# Patient Record
Sex: Female | Born: 1950 | Race: White | Hispanic: No | Marital: Married | Smoking: Never smoker
Health system: Southern US, Community
[De-identification: ages and names within clinical notes are randomized; demographics above are authoritative.]

## PROBLEM LIST (undated history)

## (undated) DIAGNOSIS — M1990 Unspecified osteoarthritis, unspecified site: Secondary | ICD-10-CM

## (undated) DIAGNOSIS — I10 Essential (primary) hypertension: Secondary | ICD-10-CM

## (undated) DIAGNOSIS — K219 Gastro-esophageal reflux disease without esophagitis: Secondary | ICD-10-CM

## (undated) DIAGNOSIS — S42009A Fracture of unspecified part of unspecified clavicle, initial encounter for closed fracture: Secondary | ICD-10-CM

## (undated) DIAGNOSIS — K760 Fatty (change of) liver, not elsewhere classified: Secondary | ICD-10-CM

## (undated) DIAGNOSIS — C649 Malignant neoplasm of unspecified kidney, except renal pelvis: Secondary | ICD-10-CM

## (undated) DIAGNOSIS — Z9289 Personal history of other medical treatment: Secondary | ICD-10-CM

## (undated) DIAGNOSIS — G4730 Sleep apnea, unspecified: Secondary | ICD-10-CM

## (undated) DIAGNOSIS — E7800 Pure hypercholesterolemia, unspecified: Secondary | ICD-10-CM

## (undated) DIAGNOSIS — F419 Anxiety disorder, unspecified: Secondary | ICD-10-CM

## (undated) DIAGNOSIS — F329 Major depressive disorder, single episode, unspecified: Secondary | ICD-10-CM

## (undated) DIAGNOSIS — K759 Inflammatory liver disease, unspecified: Secondary | ICD-10-CM

## (undated) DIAGNOSIS — S8291XA Unspecified fracture of right lower leg, initial encounter for closed fracture: Secondary | ICD-10-CM

## (undated) DIAGNOSIS — F32A Depression, unspecified: Secondary | ICD-10-CM

## (undated) DIAGNOSIS — C801 Malignant (primary) neoplasm, unspecified: Secondary | ICD-10-CM

## (undated) HISTORY — DX: Depression, unspecified: F32.A

## (undated) HISTORY — DX: Major depressive disorder, single episode, unspecified: F32.9

## (undated) HISTORY — PX: HEEL SPUR SURGERY: SHX665

## (undated) HISTORY — PX: DILATION AND CURETTAGE OF UTERUS: SHX78

## (undated) HISTORY — PX: CHOLECYSTECTOMY: SHX55

## (undated) HISTORY — DX: Fatty (change of) liver, not elsewhere classified: K76.0

## (undated) HISTORY — DX: Malignant neoplasm of unspecified kidney, except renal pelvis: C64.9

## (undated) HISTORY — DX: Essential (primary) hypertension: I10

## (undated) HISTORY — PX: ABDOMINAL HYSTERECTOMY: SHX81

## (undated) HISTORY — DX: Unspecified fracture of right lower leg, initial encounter for closed fracture: S82.91XA

## (undated) HISTORY — PX: TEMPOROMANDIBULAR JOINT SURGERY: SHX35

## (undated) HISTORY — PX: APPENDECTOMY: SHX54

---

## 1970-01-12 DIAGNOSIS — K759 Inflammatory liver disease, unspecified: Secondary | ICD-10-CM

## 1970-01-12 HISTORY — DX: Inflammatory liver disease, unspecified: K75.9

## 2002-11-13 ENCOUNTER — Encounter (HOSPITAL_COMMUNITY): Admission: RE | Admit: 2002-11-13 | Discharge: 2002-12-13 | Payer: Self-pay | Admitting: Rheumatology

## 2002-12-13 ENCOUNTER — Encounter (HOSPITAL_COMMUNITY): Admission: RE | Admit: 2002-12-13 | Discharge: 2003-01-12 | Payer: Self-pay | Admitting: Interventional Radiology

## 2002-12-19 ENCOUNTER — Ambulatory Visit (HOSPITAL_COMMUNITY): Admission: RE | Admit: 2002-12-19 | Discharge: 2002-12-19 | Payer: Self-pay | Admitting: Otolaryngology

## 2003-03-29 ENCOUNTER — Encounter: Admission: RE | Admit: 2003-03-29 | Discharge: 2003-03-29 | Payer: Self-pay | Admitting: Orthopedic Surgery

## 2005-03-30 ENCOUNTER — Encounter (INDEPENDENT_AMBULATORY_CARE_PROVIDER_SITE_OTHER): Payer: Self-pay | Admitting: Internal Medicine

## 2005-04-01 ENCOUNTER — Ambulatory Visit (HOSPITAL_COMMUNITY): Admission: RE | Admit: 2005-04-01 | Discharge: 2005-04-01 | Payer: Self-pay | Admitting: Internal Medicine

## 2005-05-14 ENCOUNTER — Ambulatory Visit (HOSPITAL_COMMUNITY): Admission: RE | Admit: 2005-05-14 | Discharge: 2005-05-14 | Payer: Self-pay | Admitting: Internal Medicine

## 2005-05-14 ENCOUNTER — Ambulatory Visit: Payer: Self-pay | Admitting: Internal Medicine

## 2005-06-03 ENCOUNTER — Ambulatory Visit (HOSPITAL_COMMUNITY): Admission: RE | Admit: 2005-06-03 | Discharge: 2005-06-03 | Payer: Self-pay | Admitting: Internal Medicine

## 2005-06-03 ENCOUNTER — Ambulatory Visit: Payer: Self-pay | Admitting: Internal Medicine

## 2005-07-29 ENCOUNTER — Ambulatory Visit: Payer: Self-pay | Admitting: Internal Medicine

## 2006-01-18 ENCOUNTER — Encounter (INDEPENDENT_AMBULATORY_CARE_PROVIDER_SITE_OTHER): Payer: Self-pay | Admitting: *Deleted

## 2006-01-18 ENCOUNTER — Ambulatory Visit: Payer: Self-pay | Admitting: Internal Medicine

## 2006-01-18 ENCOUNTER — Ambulatory Visit (HOSPITAL_COMMUNITY): Admission: RE | Admit: 2006-01-18 | Discharge: 2006-01-18 | Payer: Self-pay | Admitting: Gastroenterology

## 2006-09-03 ENCOUNTER — Ambulatory Visit: Payer: Self-pay | Admitting: Internal Medicine

## 2006-09-03 DIAGNOSIS — R5381 Other malaise: Secondary | ICD-10-CM | POA: Insufficient documentation

## 2006-09-03 DIAGNOSIS — IMO0001 Reserved for inherently not codable concepts without codable children: Secondary | ICD-10-CM

## 2006-09-03 DIAGNOSIS — K5900 Constipation, unspecified: Secondary | ICD-10-CM | POA: Insufficient documentation

## 2006-09-03 DIAGNOSIS — R32 Unspecified urinary incontinence: Secondary | ICD-10-CM

## 2006-09-03 DIAGNOSIS — R5383 Other fatigue: Secondary | ICD-10-CM

## 2006-09-03 DIAGNOSIS — M76899 Other specified enthesopathies of unspecified lower limb, excluding foot: Secondary | ICD-10-CM

## 2006-09-03 DIAGNOSIS — I10 Essential (primary) hypertension: Secondary | ICD-10-CM

## 2006-09-03 DIAGNOSIS — F329 Major depressive disorder, single episode, unspecified: Secondary | ICD-10-CM

## 2006-09-03 DIAGNOSIS — M545 Low back pain: Secondary | ICD-10-CM

## 2006-09-03 DIAGNOSIS — M1990 Unspecified osteoarthritis, unspecified site: Secondary | ICD-10-CM | POA: Insufficient documentation

## 2006-09-03 DIAGNOSIS — E785 Hyperlipidemia, unspecified: Secondary | ICD-10-CM

## 2006-09-03 DIAGNOSIS — J4489 Other specified chronic obstructive pulmonary disease: Secondary | ICD-10-CM | POA: Insufficient documentation

## 2006-09-03 DIAGNOSIS — J449 Chronic obstructive pulmonary disease, unspecified: Secondary | ICD-10-CM

## 2006-09-03 LAB — CONVERTED CEMR LAB
Bilirubin Urine: NEGATIVE
Glucose, Urine, Semiquant: NEGATIVE
Ketones, urine, test strip: NEGATIVE
Nitrite: POSITIVE
Protein, U semiquant: 30
Specific Gravity, Urine: 1.02
Urobilinogen, UA: 0.2
pH: 6.5

## 2006-09-04 ENCOUNTER — Encounter (INDEPENDENT_AMBULATORY_CARE_PROVIDER_SITE_OTHER): Payer: Self-pay | Admitting: Internal Medicine

## 2006-09-06 ENCOUNTER — Telehealth (INDEPENDENT_AMBULATORY_CARE_PROVIDER_SITE_OTHER): Payer: Self-pay | Admitting: *Deleted

## 2006-09-09 ENCOUNTER — Ambulatory Visit (HOSPITAL_COMMUNITY): Admission: RE | Admit: 2006-09-09 | Discharge: 2006-09-09 | Payer: Self-pay | Admitting: Internal Medicine

## 2006-09-09 ENCOUNTER — Encounter (INDEPENDENT_AMBULATORY_CARE_PROVIDER_SITE_OTHER): Payer: Self-pay | Admitting: Internal Medicine

## 2006-09-09 ENCOUNTER — Telehealth (INDEPENDENT_AMBULATORY_CARE_PROVIDER_SITE_OTHER): Payer: Self-pay | Admitting: *Deleted

## 2006-09-14 ENCOUNTER — Telehealth (INDEPENDENT_AMBULATORY_CARE_PROVIDER_SITE_OTHER): Payer: Self-pay | Admitting: *Deleted

## 2006-09-20 ENCOUNTER — Encounter (INDEPENDENT_AMBULATORY_CARE_PROVIDER_SITE_OTHER): Payer: Self-pay | Admitting: Internal Medicine

## 2006-09-21 ENCOUNTER — Encounter (INDEPENDENT_AMBULATORY_CARE_PROVIDER_SITE_OTHER): Payer: Self-pay | Admitting: Internal Medicine

## 2006-10-01 ENCOUNTER — Encounter (INDEPENDENT_AMBULATORY_CARE_PROVIDER_SITE_OTHER): Payer: Self-pay | Admitting: Internal Medicine

## 2007-01-03 ENCOUNTER — Ambulatory Visit: Payer: Self-pay | Admitting: Internal Medicine

## 2007-01-03 LAB — CONVERTED CEMR LAB
Bilirubin Urine: NEGATIVE
Blood in Urine, dipstick: NEGATIVE
Glucose, Urine, Semiquant: NEGATIVE
Nitrite: POSITIVE
Protein, U semiquant: 30
Specific Gravity, Urine: 1.025
Urobilinogen, UA: 0.2
pH: 5.5

## 2007-01-27 ENCOUNTER — Ambulatory Visit: Payer: Self-pay | Admitting: Internal Medicine

## 2007-01-27 LAB — CONVERTED CEMR LAB
Bilirubin Urine: NEGATIVE
Blood in Urine, dipstick: NEGATIVE
Glucose, Urine, Semiquant: NEGATIVE
Nitrite: NEGATIVE
Protein, U semiquant: NEGATIVE
Specific Gravity, Urine: 1.02
Urobilinogen, UA: 1
pH: 6.5

## 2007-01-28 ENCOUNTER — Encounter (INDEPENDENT_AMBULATORY_CARE_PROVIDER_SITE_OTHER): Payer: Self-pay | Admitting: Internal Medicine

## 2007-04-13 ENCOUNTER — Ambulatory Visit: Payer: Self-pay | Admitting: Internal Medicine

## 2007-04-13 DIAGNOSIS — R9431 Abnormal electrocardiogram [ECG] [EKG]: Secondary | ICD-10-CM

## 2007-04-13 DIAGNOSIS — R519 Headache, unspecified: Secondary | ICD-10-CM | POA: Insufficient documentation

## 2007-04-13 DIAGNOSIS — R51 Headache: Secondary | ICD-10-CM | POA: Insufficient documentation

## 2007-04-14 ENCOUNTER — Telehealth (INDEPENDENT_AMBULATORY_CARE_PROVIDER_SITE_OTHER): Payer: Self-pay | Admitting: *Deleted

## 2007-04-14 ENCOUNTER — Encounter (INDEPENDENT_AMBULATORY_CARE_PROVIDER_SITE_OTHER): Payer: Self-pay | Admitting: Internal Medicine

## 2007-04-14 LAB — CONVERTED CEMR LAB
HCV Ab: NEGATIVE
Hepatitis B Surface Ag: NEGATIVE

## 2007-04-15 ENCOUNTER — Telehealth (INDEPENDENT_AMBULATORY_CARE_PROVIDER_SITE_OTHER): Payer: Self-pay | Admitting: *Deleted

## 2007-05-13 ENCOUNTER — Telehealth (INDEPENDENT_AMBULATORY_CARE_PROVIDER_SITE_OTHER): Payer: Self-pay | Admitting: *Deleted

## 2007-06-07 ENCOUNTER — Ambulatory Visit (HOSPITAL_COMMUNITY): Admission: RE | Admit: 2007-06-07 | Discharge: 2007-06-07 | Payer: Self-pay | Admitting: Internal Medicine

## 2007-06-16 ENCOUNTER — Encounter: Admission: RE | Admit: 2007-06-16 | Discharge: 2007-06-16 | Payer: Self-pay | Admitting: Internal Medicine

## 2007-06-16 ENCOUNTER — Encounter (INDEPENDENT_AMBULATORY_CARE_PROVIDER_SITE_OTHER): Payer: Self-pay | Admitting: Internal Medicine

## 2007-06-29 ENCOUNTER — Telehealth (INDEPENDENT_AMBULATORY_CARE_PROVIDER_SITE_OTHER): Payer: Self-pay | Admitting: *Deleted

## 2007-07-13 ENCOUNTER — Ambulatory Visit: Payer: Self-pay | Admitting: Internal Medicine

## 2007-07-27 ENCOUNTER — Encounter (INDEPENDENT_AMBULATORY_CARE_PROVIDER_SITE_OTHER): Payer: Self-pay | Admitting: Internal Medicine

## 2007-09-05 ENCOUNTER — Telehealth (INDEPENDENT_AMBULATORY_CARE_PROVIDER_SITE_OTHER): Payer: Self-pay | Admitting: *Deleted

## 2007-09-06 ENCOUNTER — Ambulatory Visit: Payer: Self-pay | Admitting: Internal Medicine

## 2007-09-06 LAB — CONVERTED CEMR LAB
Bilirubin Urine: NEGATIVE
Glucose, Urine, Semiquant: 100
Ketones, urine, test strip: NEGATIVE
Nitrite: POSITIVE
Protein, U semiquant: 30
Specific Gravity, Urine: 1.015
Urobilinogen, UA: 1
pH: 7.5

## 2007-10-28 ENCOUNTER — Ambulatory Visit: Payer: Self-pay | Admitting: Internal Medicine

## 2007-11-01 ENCOUNTER — Encounter (INDEPENDENT_AMBULATORY_CARE_PROVIDER_SITE_OTHER): Payer: Self-pay | Admitting: Internal Medicine

## 2007-11-03 ENCOUNTER — Encounter (INDEPENDENT_AMBULATORY_CARE_PROVIDER_SITE_OTHER): Payer: Self-pay | Admitting: Internal Medicine

## 2007-11-03 LAB — CONVERTED CEMR LAB
ALT: 24 units/L (ref 0–35)
AST: 24 units/L (ref 0–37)
Albumin: 4.6 g/dL (ref 3.5–5.2)
Alkaline Phosphatase: 70 units/L (ref 39–117)
BUN: 26 mg/dL — ABNORMAL HIGH (ref 6–23)
CO2: 24 meq/L (ref 19–32)
Calcium: 9.6 mg/dL (ref 8.4–10.5)
Chloride: 103 meq/L (ref 96–112)
Cholesterol: 216 mg/dL — ABNORMAL HIGH (ref 0–200)
Creatinine, Ser: 0.9 mg/dL (ref 0.40–1.20)
Glucose, Bld: 110 mg/dL — ABNORMAL HIGH (ref 70–99)
HDL: 47 mg/dL (ref >39)
LDL Cholesterol: 146 mg/dL — ABNORMAL HIGH (ref 0–99)
Potassium: 3.6 meq/L (ref 3.5–5.3)
Sodium: 141 meq/L (ref 135–145)
Total Bilirubin: 0.5 mg/dL (ref 0.3–1.2)
Total CHOL/HDL Ratio: 4.6
Total Protein: 7.9 g/dL (ref 6.0–8.3)
Triglycerides: 116 mg/dL (ref <150)
VLDL: 23 mg/dL (ref 0–40)

## 2007-11-17 ENCOUNTER — Telehealth (INDEPENDENT_AMBULATORY_CARE_PROVIDER_SITE_OTHER): Payer: Self-pay | Admitting: *Deleted

## 2007-12-28 ENCOUNTER — Encounter: Admission: RE | Admit: 2007-12-28 | Discharge: 2007-12-28 | Payer: Self-pay | Admitting: Internal Medicine

## 2008-03-08 ENCOUNTER — Ambulatory Visit: Payer: Self-pay | Admitting: Internal Medicine

## 2008-03-14 ENCOUNTER — Encounter (INDEPENDENT_AMBULATORY_CARE_PROVIDER_SITE_OTHER): Payer: Self-pay | Admitting: Internal Medicine

## 2008-04-09 ENCOUNTER — Encounter (INDEPENDENT_AMBULATORY_CARE_PROVIDER_SITE_OTHER): Payer: Self-pay | Admitting: Internal Medicine

## 2008-04-10 ENCOUNTER — Ambulatory Visit: Payer: Self-pay | Admitting: Internal Medicine

## 2008-04-10 DIAGNOSIS — B353 Tinea pedis: Secondary | ICD-10-CM

## 2008-04-10 DIAGNOSIS — E119 Type 2 diabetes mellitus without complications: Secondary | ICD-10-CM

## 2008-04-10 LAB — CONVERTED CEMR LAB
ALT: 19 units/L (ref 0–35)
AST: 18 units/L (ref 0–37)
Albumin: 4.3 g/dL (ref 3.5–5.2)
Alkaline Phosphatase: 63 units/L (ref 39–117)
BUN: 24 mg/dL — ABNORMAL HIGH (ref 6–23)
CO2: 23 meq/L (ref 19–32)
Calcium: 9 mg/dL (ref 8.4–10.5)
Chloride: 105 meq/L (ref 96–112)
Cholesterol: 220 mg/dL — ABNORMAL HIGH (ref 0–200)
Creatinine, Ser: 0.64 mg/dL (ref 0.40–1.20)
Glucose, Bld: 134 mg/dL — ABNORMAL HIGH (ref 70–99)
Glucose, Bld: 120 mg/dL
HDL: 41 mg/dL (ref >39)
LDL Cholesterol: 141 mg/dL — ABNORMAL HIGH (ref 0–99)
Potassium: 3.6 meq/L (ref 3.5–5.3)
Sodium: 141 meq/L (ref 135–145)
Total Bilirubin: 0.4 mg/dL (ref 0.3–1.2)
Total CHOL/HDL Ratio: 5.4
Total Protein: 6.9 g/dL (ref 6.0–8.3)
Triglycerides: 190 mg/dL — ABNORMAL HIGH (ref <150)
VLDL: 38 mg/dL (ref 0–40)

## 2008-04-18 ENCOUNTER — Telehealth (INDEPENDENT_AMBULATORY_CARE_PROVIDER_SITE_OTHER): Payer: Self-pay | Admitting: *Deleted

## 2008-10-09 ENCOUNTER — Encounter (INDEPENDENT_AMBULATORY_CARE_PROVIDER_SITE_OTHER): Payer: Self-pay | Admitting: Internal Medicine

## 2010-02-09 LAB — CONVERTED CEMR LAB
ALT: 57 units/L — ABNORMAL HIGH (ref 0–35)
AST: 92 units/L — ABNORMAL HIGH (ref 0–37)
Albumin: 4.4 g/dL (ref 3.5–5.2)
Alkaline Phosphatase: 99 units/L (ref 39–117)
BUN: 26 mg/dL — ABNORMAL HIGH (ref 6–23)
Basophils Absolute: 0 10*3/uL (ref 0.0–0.1)
Basophils Relative: 0 % (ref 0–1)
CO2: 22 meq/L (ref 19–32)
Calcium: 10 mg/dL (ref 8.4–10.5)
Chloride: 101 meq/L (ref 96–112)
Cholesterol: 225 mg/dL — ABNORMAL HIGH (ref 0–200)
Creatinine, Ser: 0.77 mg/dL (ref 0.40–1.20)
Eosinophils Absolute: 0.6 10*3/uL (ref 0.0–0.7)
Eosinophils Relative: 4 % (ref 0–5)
Glucose, Bld: 127 mg/dL — ABNORMAL HIGH (ref 70–99)
HCT: 42.5 % (ref 36.0–46.0)
HDL: 38 mg/dL — ABNORMAL LOW (ref >39)
Hemoglobin: 14.3 g/dL (ref 12.0–15.0)
LDL Cholesterol: 127 mg/dL — ABNORMAL HIGH (ref 0–99)
Lymphocytes Relative: 28 % (ref 12–46)
Lymphs Abs: 3.8 10*3/uL (ref 0.7–4.0)
MCHC: 33.6 g/dL (ref 30.0–36.0)
MCV: 91.2 fL (ref 78.0–100.0)
Monocytes Absolute: 0.5 10*3/uL (ref 0.1–1.0)
Monocytes Relative: 4 % (ref 3–12)
Neutro Abs: 8.4 10*3/uL — ABNORMAL HIGH (ref 1.7–7.7)
Neutrophils Relative %: 63 % (ref 43–77)
Platelets: 326 10*3/uL (ref 150–400)
Potassium: 3.9 meq/L (ref 3.5–5.3)
RBC: 4.66 M/uL (ref 3.87–5.11)
RDW: 14.4 % (ref 11.5–15.5)
Sodium: 138 meq/L (ref 135–145)
Total Bilirubin: 0.5 mg/dL (ref 0.3–1.2)
Total CHOL/HDL Ratio: 5.9
Total Protein: 7.9 g/dL (ref 6.0–8.3)
Triglycerides: 299 mg/dL — ABNORMAL HIGH (ref <150)
VLDL: 60 mg/dL — ABNORMAL HIGH (ref 0–40)
WBC: 13.3 10*3/uL — ABNORMAL HIGH (ref 4.0–10.5)

## 2010-05-30 NOTE — Op Note (Signed)
[name], [name]                 ACCOUNT NO.:  [id number]   MEDICAL RECORD NO.:  [id number]          PATIENT TYPE:  AMB   LOCATION:  DAY                           FACILITY:  [facility]   PHYSICIAN:  Lionel December, M.D.    DATE OF BIRTH:  1950/06/15   DATE OF PROCEDURE:  01/18/2006  DATE OF DISCHARGE:                               OPERATIVE REPORT   PROCEDURE:  Colonoscopy.   INDICATIONS FOR PROCEDURE:  [name] [name] is a 60 year old Caucasian female with  history of colonic polyps.  She had two adenomas removed in December of  2002 in [place], [place].  She wanted to have this study done locally.  She remains free of GI symptoms.  Family history is negative for  colorectal carcinoma.  The procedure risks were reviewed with the  patient, and informed consent was obtained.   MEDICATIONS FOR CONSCIOUS SEDATION:  Demerol 50 mg IV, Versed 8 mg IV.   FINDINGS:  The procedure was performed in the endoscopy suite.  The  patient's vital signs and O2 saturations were monitored during the  procedure and remained stable.  The patient was placed in the left  lateral recumbent position and rectal examination performed.  No  abnormality noted on external or digital exam.  The Pentax videoscope  was placed into the rectum and advanced under vision into the sigmoid  colon and beyond.  The preparation was satisfactory.  The scope was  passed into the cecum which was identified by the appendiceal orifice  and ileocecal valve.  Pictures were taken for the record.  As the scope  was withdrawn, the colonic mucosa was carefully examined.  There was a 3  to 4-mm polyp at the splenic flexure which was ablated by cold biopsy.  Another 4-mm polyp was noted at the proximal sigmoid colon which was  ablated in a similar fashion.  The mucosa of the rest of the colon was  normal.  The rectal mucosa similarly was normal.  The scope was  retroflexed to examine the anorectal junction, and small hemorrhoids  were noted below  the dentate line.  The endoscope was straightened and  withdrawn.  The patient tolerated the procedure well.   FINAL DIAGNOSES:  1. Examination performed to the cecum.  2. Two small polyps ablated by cold biopsy, one from splenic flexure      and the second one from the sigmoid colon.  3. Small external hemorrhoids.   RECOMMENDATIONS:  Standard instructions given.  I will be contacting the  patient with the results of the biopsy and further recommendations.      Lionel December, M.D.  Electronically Signed     NR/MEDQ  D:  01/18/2006  T:  01/18/2006  Job:  [id number]   cc:   [name] [name]. Sherwood Gambler, MD  Fax: [phone]

## 2010-05-30 NOTE — Op Note (Signed)
[name] [name], [name] [name]                 ACCOUNT NO.:  [id number]   MEDICAL RECORD NO.:  [id number]          PATIENT TYPE:  AMB   LOCATION:  DAY                           FACILITY:  [facility]   PHYSICIAN:  Lionel December, M.D.    DATE OF BIRTH:  10-29-1950   DATE OF PROCEDURE:  06/03/2005  DATE OF DISCHARGE:                                 OPERATIVE REPORT   PROCEDURE:  Esophagogastroduodenoscopy with esophageal dilation.   INDICATIONS:  [name] is a 60 year old Caucasian female with chronic GERD  symptoms not well controlled with PPI, was also having solid food dysphagia.  We felt that if her esophageal mucosa is normal, we may consider pH study.  The procedure risks were reviewed with the patient and informed consent was  obtained.   MEDS FOR CONSCIOUS SEDATION:  Benzocaine spray pharyngeal topical  anesthesia, Demerol 50 mg IV, Versed 8 mg IV.   FINDINGS:  The procedure was performed in the endoscopy suite.  The  patient's vital signs and O2 sat were monitored during the procedure and  remained stable.  The patient was placed in the left lateral position and  the Olympus videoscope was passed via oropharynx without any difficulty into  the esophagus.   Esophagus:  The mucosa of the esophagus was normal.  She had single  prominent erosion at the GE junction extending in a cephalad direction for 3-  4 mm.  There was no obvious ring or stricture noted.  The GE junction was at  37 cm and hiatus was at 40.   Stomach.  It was empty other than a small amount of bile.  It distended very  well with insufflation.  The folds of the proximal stomach were normal.  Examination of the mucosa revealed multiple petechiae at the body and antrum  with antral erythema and some bile coating.  The pyloric channel was patent.  The angularis, fundus and cardia were examined by retroflexing the scope and  were normal.   Duodenum:  The bulbar mucosa was normal.  The scope was passed into the  second part of the  duodenal mucosa and the folds were normal.   The endoscope was withdrawn.   The esophagus was dilated by passing a 56 [place] Maloney dilator to full  insertion.  As the dilator was withdrawn, the endoscope was passed again.  There was a scant amount of blood in the cervical esophagus but no tear was  evident but a small linear tear was suspected, possibly due to esophageal  erosion.  The endoscope was withdrawn.  The patient tolerated the procedure  well.  A Bravo device was not placed since she has erosive esophagitis.   RECOMMENDATIONS:  1.  Anti-reflux measures were reinforced.  2.  Omeprazole 20 mg p.o. b.i.d., prescription given for 59 with five      refills.  3.  She will return for OV in two months.  If she remains with dysphagia,      will consider barium pill study.      Lionel December, M.D.  Electronically Signed     NR/MEDQ  D:  06/03/2005  T:  06/03/2005  Job:  [id number]   cc:   Madelin Rear. Sherwood Gambler, MD  Fax: [phone]

## 2011-07-10 ENCOUNTER — Other Ambulatory Visit: Payer: Self-pay | Admitting: Physician Assistant

## 2011-07-10 DIAGNOSIS — N289 Disorder of kidney and ureter, unspecified: Secondary | ICD-10-CM

## 2011-07-20 ENCOUNTER — Ambulatory Visit
Admission: RE | Admit: 2011-07-20 | Discharge: 2011-07-20 | Disposition: A | Discharge status: Home / Self Care | Payer: Self-pay | Source: Ambulatory Visit | Attending: Physician Assistant | Admitting: Physician Assistant

## 2011-07-20 DIAGNOSIS — N289 Disorder of kidney and ureter, unspecified: Secondary | ICD-10-CM

## 2011-07-20 MED ORDER — GADOBENATE DIMEGLUMINE 529 MG/ML IV SOLN
20.0000 mL | Freq: Once | INTRAVENOUS | Status: AC | PRN
  Administered 2011-07-20: 20 mL via INTRAVENOUS

## 2011-08-03 ENCOUNTER — Encounter (HOSPITAL_COMMUNITY): Payer: Self-pay | Attending: Oncology | Admitting: Oncology

## 2011-08-03 ENCOUNTER — Encounter (HOSPITAL_COMMUNITY): Payer: Self-pay | Admitting: Oncology

## 2011-08-03 VITALS — BP 125/69 | HR 69 | Temp 98.2°F | Ht 66.0 in | Wt 216.9 lb

## 2011-08-03 DIAGNOSIS — E7800 Pure hypercholesterolemia, unspecified: Secondary | ICD-10-CM | POA: Insufficient documentation

## 2011-08-03 DIAGNOSIS — N2889 Other specified disorders of kidney and ureter: Secondary | ICD-10-CM

## 2011-08-03 DIAGNOSIS — E669 Obesity, unspecified: Secondary | ICD-10-CM | POA: Insufficient documentation

## 2011-08-03 DIAGNOSIS — I10 Essential (primary) hypertension: Secondary | ICD-10-CM | POA: Insufficient documentation

## 2011-08-03 DIAGNOSIS — R911 Solitary pulmonary nodule: Secondary | ICD-10-CM | POA: Insufficient documentation

## 2011-08-03 DIAGNOSIS — C649 Malignant neoplasm of unspecified kidney, except renal pelvis: Secondary | ICD-10-CM

## 2011-08-03 DIAGNOSIS — Z79899 Other long term (current) drug therapy: Secondary | ICD-10-CM | POA: Insufficient documentation

## 2011-08-03 DIAGNOSIS — E119 Type 2 diabetes mellitus without complications: Secondary | ICD-10-CM | POA: Insufficient documentation

## 2011-08-03 DIAGNOSIS — N289 Disorder of kidney and ureter, unspecified: Secondary | ICD-10-CM | POA: Insufficient documentation

## 2011-08-03 LAB — CBC
HCT: 37.7 % (ref 36.0–46.0)
Hemoglobin: 12.6 g/dL (ref 12.0–15.0)
MCHC: 33.4 g/dL (ref 30.0–36.0)
MCV: 88.5 fL (ref 78.0–100.0)
RDW: 14.4 % (ref 11.5–15.5)
WBC: 14.2 10*3/uL — ABNORMAL HIGH (ref 4.0–10.5)

## 2011-08-03 LAB — COMPREHENSIVE METABOLIC PANEL
BUN: 27 mg/dL — ABNORMAL HIGH (ref 6–23)
CO2: 24 mEq/L (ref 19–32)
Calcium: 10.5 mg/dL (ref 8.4–10.5)
Chloride: 96 mEq/L (ref 96–112)
Creatinine, Ser: 0.71 mg/dL (ref 0.50–1.10)
GFR calc Af Amer: >90 mL/min (ref >90)
GFR calc non Af Amer: >90 mL/min (ref >90)
Total Bilirubin: 0.3 mg/dL (ref 0.3–1.2)

## 2011-08-03 LAB — PROTIME-INR
INR: 1.05 (ref 0.00–1.49)
Prothrombin Time: 13.9 seconds (ref 11.6–15.2)

## 2011-08-03 LAB — DIFFERENTIAL
Basophils Absolute: 0 10*3/uL (ref 0.0–0.1)
Eosinophils Relative: 3 % (ref 0–5)
Lymphocytes Relative: 28 % (ref 12–46)
Monocytes Absolute: 0.5 10*3/uL (ref 0.1–1.0)
Monocytes Relative: 4 % (ref 3–12)
Neutro Abs: 9.4 10*3/uL — ABNORMAL HIGH (ref 1.7–7.7)

## 2011-08-03 LAB — APTT: aPTT: 35 seconds (ref 24–37)

## 2011-08-03 NOTE — Patient Instructions (Addendum)
[name] [name]  [id number] Jan 13, 1950 Dr. Glenford Peers   [facility]  Discharge Instructions  RECOMMENDATIONS MADE BY THE CONSULTANT AND ANY TEST RESULTS WILL BE SENT TO YOUR REFERRING DOCTOR.   EXAM FINDINGS BY MD TODAY AND SIGNS AND SYMPTOMS TO REPORT TO CLINIC OR PRIMARY MD: exam and discussion per MD.  Need to get a PET scan to see if there is any disease anywhere else.  Will also get you an appointment to see Dr. Laverle Patter about surgical intervention.  Once you have had surgery we will get a nutritional consult for life style change in your diet.  MEDICATIONS PRESCRIBED: none   INSTRUCTIONS GIVEN AND DISCUSSED:   SPECIAL INSTRUCTIONS/FOLLOW-UP: Lab work Needed today, Xray Studies Needed PET Scan and Return to Clinic after surgery.   I acknowledge that I have been informed and understand all the instructions given to me and received a copy. I do not have any more questions at this time, but understand that I may call the Specialty Clinic at [facility] at [phone] during business hours should I have any further questions or need assistance in obtaining follow-up care.    __________________________________________  _____________  __________ Signature of Patient or Authorized Representative            Date                   Time    __________________________________________ Nurse's Signature
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
## 2011-08-03 NOTE — Progress Notes (Signed)
[name] [name] presented for labwork. Labs per MD order drawn via Peripheral Line 23 gauge needle inserted in right upper forarm  Good blood return present. Procedure without incident.  Needle removed intact. Patient tolerated procedure well.

## 2011-08-07 ENCOUNTER — Encounter (HOSPITAL_COMMUNITY): Payer: Self-pay | Admitting: Pharmacy Technician

## 2011-08-07 ENCOUNTER — Other Ambulatory Visit: Payer: Self-pay | Admitting: Urology

## 2011-08-10 ENCOUNTER — Encounter (HOSPITAL_COMMUNITY)
Admission: RE | Admit: 2011-08-10 | Discharge: 2011-08-10 | Disposition: A | Discharge status: Home / Self Care | Payer: Self-pay | Source: Ambulatory Visit | Attending: Oncology | Admitting: Oncology

## 2011-08-10 DIAGNOSIS — K439 Ventral hernia without obstruction or gangrene: Secondary | ICD-10-CM | POA: Insufficient documentation

## 2011-08-10 DIAGNOSIS — N2889 Other specified disorders of kidney and ureter: Secondary | ICD-10-CM

## 2011-08-10 DIAGNOSIS — N289 Disorder of kidney and ureter, unspecified: Secondary | ICD-10-CM | POA: Insufficient documentation

## 2011-08-10 DIAGNOSIS — N200 Calculus of kidney: Secondary | ICD-10-CM | POA: Insufficient documentation

## 2011-08-10 DIAGNOSIS — E041 Nontoxic single thyroid nodule: Secondary | ICD-10-CM | POA: Insufficient documentation

## 2011-08-10 DIAGNOSIS — R911 Solitary pulmonary nodule: Secondary | ICD-10-CM | POA: Insufficient documentation

## 2011-08-10 LAB — GLUCOSE, CAPILLARY: Glucose-Capillary: 113 mg/dL — ABNORMAL HIGH (ref 70–99)

## 2011-08-10 MED ORDER — FLUDEOXYGLUCOSE F - 18 (FDG) INJECTION
17.8000 | Freq: Once | INTRAVENOUS | Status: AC | PRN
  Administered 2011-08-10: 17.8 via INTRAVENOUS

## 2011-08-11 ENCOUNTER — Encounter (HOSPITAL_COMMUNITY)
Admission: RE | Admit: 2011-08-11 | Discharge: 2011-08-11 | Disposition: A | Discharge status: Home / Self Care | Payer: Self-pay | Source: Ambulatory Visit | Attending: Urology | Admitting: Urology

## 2011-08-11 ENCOUNTER — Encounter (HOSPITAL_COMMUNITY): Payer: Self-pay

## 2011-08-11 HISTORY — DX: Malignant (primary) neoplasm, unspecified: C80.1

## 2011-08-11 HISTORY — DX: Pure hypercholesterolemia, unspecified: E78.00

## 2011-08-11 HISTORY — DX: Gastro-esophageal reflux disease without esophagitis: K21.9

## 2011-08-11 HISTORY — DX: Sleep apnea, unspecified: G47.30

## 2011-08-11 HISTORY — DX: Unspecified osteoarthritis, unspecified site: M19.90

## 2011-08-11 HISTORY — DX: Personal history of other medical treatment: Z92.89

## 2011-08-11 HISTORY — DX: Fracture of unspecified part of unspecified clavicle, initial encounter for closed fracture: S42.009A

## 2011-08-11 HISTORY — DX: Inflammatory liver disease, unspecified: K75.9

## 2011-08-11 LAB — CBC
HCT: 36.9 % (ref 36.0–46.0)
MCV: 88.3 fL (ref 78.0–100.0)
Platelets: 242 10*3/uL (ref 150–400)
RBC: 4.18 MIL/uL (ref 3.87–5.11)
RDW: 14 % (ref 11.5–15.5)
WBC: 12.9 10*3/uL — ABNORMAL HIGH (ref 4.0–10.5)

## 2011-08-11 LAB — SURGICAL PCR SCREEN: Staphylococcus aureus: NEGATIVE

## 2011-08-11 LAB — COMPREHENSIVE METABOLIC PANEL
ALT: 18 U/L (ref 0–35)
AST: 21 U/L (ref 0–37)
Alkaline Phosphatase: 70 U/L (ref 39–117)
CO2: 28 mEq/L (ref 19–32)
Calcium: 10 mg/dL (ref 8.4–10.5)
Glucose, Bld: 99 mg/dL (ref 70–99)
Potassium: 3.8 mEq/L (ref 3.5–5.1)
Sodium: 138 mEq/L (ref 135–145)
Total Protein: 7.6 g/dL (ref 6.0–8.3)

## 2011-08-11 LAB — TYPE AND SCREEN

## 2011-08-11 LAB — ABO/RH: ABO/RH(D): A POS

## 2011-08-11 NOTE — Patient Instructions (Signed)
20 [name] [name]  08/11/2011   Your procedure is scheduled on:  08/13/11  Surgery [id number]  THURSDAY  Report to [facility] at  0515     AM.  Call this number if you have problems the morning of surgery: [phone]     Or PST   [id number]  [facility]   Remember: DO NOT TAKE ANY BLOOD SUGAR MEDICINE MORNING OF SURGERY  Do not eat food: After Midnight. Tuesday NIGHT  May have clear liquids: all day Wednesday AS DIRECTED BY OFFICE FOR BOWEL PREP-  INCREASE FLUIDS ALL DAY, DRINK UNTIL MIDNIGHT OR BEDTIME  THEN NOTHING AFTER MIDNIGHT  Clear liquids include soda, tea, black coffee, apple or grape juice, broth.  Take these medicines the morning of surgery with A SIP OF WATER   :PAXIL                TYLENOL IF NEEDED   Do not wear jewelry, make-up or nail polish.  Do not wear lotions, powders, or perfumes. You may wear deodorant.  Do not shave 48 hours prior to surgery.  Do not bring valuables to the hospital.  Contacts, dentures or bridgework may not be worn into surgery.  Leave suitcase in the car. After surgery it may be brought to your room.  For patients admitted to the hospital, checkout time is 11:00 AM the day of discharge.   Patients discharged the day of surgery will not be allowed to drive home.  Name and phone number of your driver:  husband                                                                    Special Instructions: CHG Shower Use Special Wash: 1/2 bottle night before surgery and 1/2 bottle morning of surgery. REGULAR SOAP FACE AND PRIVATES              LADIES- NO SHAVING 48 HOURS BEFORE USING BETASEPT SOAP.                   Please read over the following fact sheets that you were given: MRSA Information

## 2011-08-11 NOTE — Progress Notes (Signed)
08/11/11 1142  OBSTRUCTIVE SLEEP APNEA  Have you ever been diagnosed with sleep apnea through a sleep study? No  Do you snore loudly (loud enough to be heard through closed doors)?  0  Do you often feel tired, fatigued, or sleepy during the daytime? 1  Has anyone observed you stop breathing during your sleep? 0  Do you have, or are you being treated for high blood pressure? 1  BMI more than 35 kg/m2? 1  Age over 62 years old? 1  Neck circumference greater than 40 cm/18 inches? 0  Gender: 0  Obstructive Sleep Apnea Score 4   Score 4 or greater  Updated health history;Results sent to PCP

## 2011-08-11 NOTE — Pre-Procedure Instructions (Signed)
Patient brought copies of ER visit, labs and multiple x rays from [facility] 07/03/11- PLACED ON CHART

## 2011-08-11 NOTE — Pre-Procedure Instructions (Signed)
Per Dr Arnell Asal note dated 08/10/11- PET scan results were noted. In Epic and on chart

## 2011-08-12 ENCOUNTER — Telehealth (HOSPITAL_COMMUNITY): Payer: Self-pay | Admitting: *Deleted

## 2011-08-12 NOTE — Telephone Encounter (Signed)
Spoke with pt as below. Verbalized understanding. States she is scheduled for surgery with Dr.Borden tomorrow. She said she is ok with [place] scheduling CT scan and then notifying her of appt date and time.

## 2011-08-12 NOTE — Telephone Encounter (Signed)
Message copied by Dennie Maizes on Wed Aug 12, 2011  1:27 PM ------      Message from: Mariel Sleet, ERIC S      Created: Wed Aug 12, 2011 12:11 PM       Pet shows only uptake in renal mass but small pulm nodules(2) need CT chest f/u in 3 months.Please arrange with her if ok but she needs to proceed with surgery with Dr Laverle Patter      ----- Message -----         From: Valentina Shaggy Jahmier Willadsen, RN         Sent: 08/11/2011   4:27 PM           To: Randall An, MD            Pt called asking if you had reviewed PET scan results and would like to know results.

## 2011-08-12 NOTE — Anesthesia Preprocedure Evaluation (Addendum)
Anesthesia Evaluation  Patient identified by MRN, date of birth, ID band Patient awake    Reviewed: Allergy & Precautions, H&P , NPO status , Patient's Chart, lab work & pertinent test results  Airway Mallampati: III TM Distance: >3 FB Neck ROM: full    Dental No notable dental hx. (+) Teeth Intact and Dental Advisory Given   Pulmonary neg pulmonary ROS, sleep apnea , COPD Stop bang 4.  Mild COPD. breath sounds clear to auscultation  Pulmonary exam normal       Cardiovascular Exercise Tolerance: Good hypertension, Pt. on medications negative cardio ROS  Rhythm:regular Rate:Normal     Neuro/Psych negative neurological ROS  negative psych ROS   GI/Hepatic negative GI ROS, Neg liver ROS, GERD-  Medicated and Controlled,(+) Hepatitis -, A  Endo/Other  negative endocrine ROSWell Controlled, Type 2, Oral Hypoglycemic Agents  Renal/GU negative Renal ROS  negative genitourinary   Musculoskeletal   Abdominal   Peds  Hematology negative hematology ROS (+)   Anesthesia Other Findings   Reproductive/Obstetrics negative OB ROS                         Anesthesia Physical Anesthesia Plan  ASA: III  Anesthesia Plan: General   Post-op Pain Management:    Induction: Intravenous  Airway Management Planned: Oral ETT  Additional Equipment:   Intra-op Plan:   Post-operative Plan: Extubation in OR  Informed Consent: I have reviewed the patients History and Physical, chart, labs and discussed the procedure including the risks, benefits and alternatives for the proposed anesthesia with the patient or authorized representative who has indicated his/her understanding and acceptance.   Dental Advisory Given  Plan Discussed with: CRNA and Surgeon  Anesthesia Plan Comments:         Anesthesia Quick Evaluation
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
## 2011-08-13 ENCOUNTER — Encounter (HOSPITAL_COMMUNITY): Payer: Self-pay | Admitting: *Deleted

## 2011-08-13 ENCOUNTER — Encounter (HOSPITAL_COMMUNITY)
Admission: RE | Disposition: A | Discharge status: Home / Self Care | Payer: Self-pay | Source: Ambulatory Visit | Attending: Urology

## 2011-08-13 ENCOUNTER — Encounter (HOSPITAL_COMMUNITY): Payer: Self-pay | Admitting: Anesthesiology

## 2011-08-13 ENCOUNTER — Inpatient Hospital Stay (HOSPITAL_COMMUNITY)
Admission: RE | Admit: 2011-08-13 | Discharge: 2011-08-16 | DRG: 658 | Disposition: A | Discharge status: Home / Self Care | Payer: MEDICAID | Source: Ambulatory Visit | Attending: Urology | Admitting: Urology

## 2011-08-13 ENCOUNTER — Ambulatory Visit (HOSPITAL_COMMUNITY): Payer: Self-pay | Admitting: Anesthesiology

## 2011-08-13 DIAGNOSIS — C649 Malignant neoplasm of unspecified kidney, except renal pelvis: Principal | ICD-10-CM | POA: Diagnosis present

## 2011-08-13 DIAGNOSIS — IMO0001 Reserved for inherently not codable concepts without codable children: Secondary | ICD-10-CM

## 2011-08-13 DIAGNOSIS — E7800 Pure hypercholesterolemia, unspecified: Secondary | ICD-10-CM | POA: Diagnosis present

## 2011-08-13 DIAGNOSIS — Z79899 Other long term (current) drug therapy: Secondary | ICD-10-CM

## 2011-08-13 DIAGNOSIS — N2889 Other specified disorders of kidney and ureter: Secondary | ICD-10-CM

## 2011-08-13 DIAGNOSIS — K219 Gastro-esophageal reflux disease without esophagitis: Secondary | ICD-10-CM | POA: Diagnosis present

## 2011-08-13 DIAGNOSIS — R51 Headache: Secondary | ICD-10-CM | POA: Diagnosis not present

## 2011-08-13 DIAGNOSIS — E119 Type 2 diabetes mellitus without complications: Secondary | ICD-10-CM | POA: Diagnosis present

## 2011-08-13 DIAGNOSIS — I10 Essential (primary) hypertension: Secondary | ICD-10-CM | POA: Diagnosis present

## 2011-08-13 HISTORY — PX: PARTIAL NEPHRECTOMY: SHX414

## 2011-08-13 LAB — GLUCOSE, CAPILLARY
Glucose-Capillary: 144 mg/dL — ABNORMAL HIGH (ref 70–99)
Glucose-Capillary: 145 mg/dL — ABNORMAL HIGH (ref 70–99)
Glucose-Capillary: 148 mg/dL — ABNORMAL HIGH (ref 70–99)
Glucose-Capillary: 205 mg/dL — ABNORMAL HIGH (ref 70–99)

## 2011-08-13 LAB — BASIC METABOLIC PANEL
BUN: 12 mg/dL (ref 6–23)
Chloride: 100 mEq/L (ref 96–112)
Creatinine, Ser: 0.78 mg/dL (ref 0.50–1.10)
GFR calc Af Amer: >90 mL/min (ref >90)
Glucose, Bld: 158 mg/dL — ABNORMAL HIGH (ref 70–99)
Potassium: 3.7 mEq/L (ref 3.5–5.1)

## 2011-08-13 SURGERY — ROBOTIC ASSITED PARTIAL NEPHRECTOMY
Anesthesia: General | Laterality: Right | Wound class: Clean

## 2011-08-13 MED ORDER — BUPIVACAINE-EPINEPHRINE 0.25% -1:200000 IJ SOLN
INTRAMUSCULAR | Status: DC | PRN
  Administered 2011-08-13: 7 mL

## 2011-08-13 MED ORDER — HYDROMORPHONE HCL PF 1 MG/ML IJ SOLN
INTRAMUSCULAR | Status: DC | PRN
  Administered 2011-08-13: 0.5 mg via INTRAVENOUS

## 2011-08-13 MED ORDER — DEXTROSE-NACL 5-0.45 % IV SOLN
INTRAVENOUS | Status: DC
  Administered 2011-08-13 – 2011-08-14 (×3): via INTRAVENOUS
  Administered 2011-08-15: 75 mL/h via INTRAVENOUS
  Administered 2011-08-15 – 2011-08-16 (×2): via INTRAVENOUS

## 2011-08-13 MED ORDER — LACTATED RINGERS IR SOLN
Status: DC | PRN
  Administered 2011-08-13: 1

## 2011-08-13 MED ORDER — EPHEDRINE SULFATE 50 MG/ML IJ SOLN
INTRAMUSCULAR | Status: DC | PRN
  Administered 2011-08-13: 10 mg via INTRAVENOUS
  Administered 2011-08-13 (×2): 5 mg via INTRAVENOUS
  Administered 2011-08-13: 10 mg via INTRAVENOUS
  Administered 2011-08-13: 5 mg via INTRAVENOUS
  Administered 2011-08-13: 10 mg via INTRAVENOUS

## 2011-08-13 MED ORDER — PROPOFOL 10 MG/ML IV EMUL
INTRAVENOUS | Status: DC | PRN
  Administered 2011-08-13: 200 mg via INTRAVENOUS

## 2011-08-13 MED ORDER — DIPHENHYDRAMINE HCL 50 MG/ML IJ SOLN: 12.5000 mg | INTRAMUSCULAR | Status: DC | PRN

## 2011-08-13 MED ORDER — LACTATED RINGERS IV SOLN: INTRAVENOUS | Status: DC

## 2011-08-13 MED ORDER — CEFAZOLIN SODIUM-DEXTROSE 2-3 GM-% IV SOLR
2.0000 g | INTRAVENOUS | Status: AC
  Administered 2011-08-13: 2 g via INTRAVENOUS

## 2011-08-13 MED ORDER — LIDOCAINE HCL (CARDIAC) 20 MG/ML IV SOLN
INTRAVENOUS | Status: DC | PRN
  Administered 2011-08-13: 80 mg via INTRAVENOUS

## 2011-08-13 MED ORDER — MORPHINE SULFATE 2 MG/ML IJ SOLN
2.0000 mg | INTRAMUSCULAR | Status: DC | PRN
  Administered 2011-08-13 (×3): 2 mg via INTRAVENOUS
  Administered 2011-08-13 – 2011-08-14 (×2): 4 mg via INTRAVENOUS
  Administered 2011-08-14 (×2): 2 mg via INTRAVENOUS
  Administered 2011-08-14: 4 mg via INTRAVENOUS
  Administered 2011-08-14 – 2011-08-16 (×5): 2 mg via INTRAVENOUS
  Filled 2011-08-13: qty 2
  Filled 2011-08-13 (×4): qty 1
  Filled 2011-08-13: qty 2
  Filled 2011-08-13: qty 1
  Filled 2011-08-13: qty 2
  Filled 2011-08-13 (×5): qty 1

## 2011-08-13 MED ORDER — HYDROMORPHONE HCL PF 1 MG/ML IJ SOLN
INTRAMUSCULAR | Status: AC
  Filled 2011-08-13: qty 1

## 2011-08-13 MED ORDER — NEOSTIGMINE METHYLSULFATE 1 MG/ML IJ SOLN
INTRAMUSCULAR | Status: DC | PRN
  Administered 2011-08-13: 5 mg via INTRAVENOUS

## 2011-08-13 MED ORDER — LACTATED RINGERS IV SOLN
INTRAVENOUS | Status: DC | PRN
  Administered 2011-08-13 (×2): via INTRAVENOUS

## 2011-08-13 MED ORDER — STERILE WATER FOR IRRIGATION IR SOLN
Status: DC | PRN
  Administered 2011-08-13: 3000 mL

## 2011-08-13 MED ORDER — CEFAZOLIN SODIUM-DEXTROSE 2-3 GM-% IV SOLR
INTRAVENOUS | Status: AC
  Filled 2011-08-13: qty 50

## 2011-08-13 MED ORDER — PAROXETINE HCL 20 MG PO TABS
20.0000 mg | ORAL_TABLET | Freq: Every day | ORAL | Status: DC
  Administered 2011-08-14 – 2011-08-16 (×3): 20 mg via ORAL
  Filled 2011-08-13 (×3): qty 1

## 2011-08-13 MED ORDER — HYDROMORPHONE HCL PF 1 MG/ML IJ SOLN: 0.5000 mg | INTRAMUSCULAR | Status: DC | PRN

## 2011-08-13 MED ORDER — FENTANYL CITRATE 0.05 MG/ML IJ SOLN
INTRAMUSCULAR | Status: DC | PRN
  Administered 2011-08-13 (×5): 50 ug via INTRAVENOUS

## 2011-08-13 MED ORDER — ONDANSETRON HCL 4 MG/2ML IJ SOLN: 4.0000 mg | INTRAMUSCULAR | Status: DC | PRN

## 2011-08-13 MED ORDER — BUPIVACAINE-EPINEPHRINE PF 0.25-1:200000 % IJ SOLN
INTRAMUSCULAR | Status: AC
  Filled 2011-08-13: qty 30

## 2011-08-13 MED ORDER — ONDANSETRON HCL 4 MG/2ML IJ SOLN
INTRAMUSCULAR | Status: DC | PRN
  Administered 2011-08-13: 4 mg via INTRAVENOUS

## 2011-08-13 MED ORDER — GLYCOPYRROLATE 0.2 MG/ML IJ SOLN
INTRAMUSCULAR | Status: DC | PRN
Start: 2011-08-13 — End: 2011-08-13
  Administered 2011-08-13: .8 mg via INTRAVENOUS

## 2011-08-13 MED ORDER — BUPIVACAINE LIPOSOME 1.3 % IJ SUSP
20.0000 mL | INTRAMUSCULAR | Status: AC
  Filled 2011-08-13: qty 20

## 2011-08-13 MED ORDER — MANNITOL 25 % IV SOLN
INTRAVENOUS | Status: DC | PRN
  Administered 2011-08-13 (×2): 12.5 g via INTRAVENOUS

## 2011-08-13 MED ORDER — HYDROMORPHONE HCL PF 1 MG/ML IJ SOLN
0.2500 mg | INTRAMUSCULAR | Status: DC | PRN
  Administered 2011-08-13 (×5): 0.5 mg via INTRAVENOUS

## 2011-08-13 MED ORDER — MIDAZOLAM HCL 5 MG/5ML IJ SOLN
INTRAMUSCULAR | Status: DC | PRN
  Administered 2011-08-13: 2 mg via INTRAVENOUS

## 2011-08-13 MED ORDER — BUPIVACAINE LIPOSOME 1.3 % IJ SUSP
INTRAMUSCULAR | Status: DC | PRN
  Administered 2011-08-13: 20 mL

## 2011-08-13 MED ORDER — ZOLPIDEM TARTRATE 5 MG PO TABS
5.0000 mg | ORAL_TABLET | Freq: Every evening | ORAL | Status: DC | PRN
  Administered 2011-08-13: 5 mg via ORAL
  Filled 2011-08-13: qty 1

## 2011-08-13 MED ORDER — SIMVASTATIN 40 MG PO TABS
40.0000 mg | ORAL_TABLET | Freq: Every day | ORAL | Status: DC
  Administered 2011-08-13 – 2011-08-15 (×3): 40 mg via ORAL
  Filled 2011-08-13 (×4): qty 1

## 2011-08-13 MED ORDER — CEFAZOLIN SODIUM 1-5 GM-% IV SOLN
1.0000 g | Freq: Three times a day (TID) | INTRAVENOUS | Status: AC
  Administered 2011-08-13 (×2): 1 g via INTRAVENOUS
  Filled 2011-08-13 (×3): qty 50

## 2011-08-13 MED ORDER — ACETAMINOPHEN 10 MG/ML IV SOLN
1000.0000 mg | Freq: Four times a day (QID) | INTRAVENOUS | Status: AC
  Administered 2011-08-13 – 2011-08-14 (×4): 1000 mg via INTRAVENOUS
  Filled 2011-08-13 (×4): qty 100

## 2011-08-13 MED ORDER — INSULIN ASPART 100 UNIT/ML ~~LOC~~ SOLN
0.0000 [IU] | SUBCUTANEOUS | Status: DC
  Administered 2011-08-13: 3 [IU] via SUBCUTANEOUS
  Administered 2011-08-13: 7 [IU] via SUBCUTANEOUS
  Administered 2011-08-14 (×2): 3 [IU] via SUBCUTANEOUS
  Administered 2011-08-14: 4 [IU] via SUBCUTANEOUS
  Administered 2011-08-14 (×3): 3 [IU] via SUBCUTANEOUS
  Administered 2011-08-15 (×2): 4 [IU] via SUBCUTANEOUS
  Administered 2011-08-15 (×2): 3 [IU] via SUBCUTANEOUS
  Administered 2011-08-16: 4 [IU] via SUBCUTANEOUS
  Administered 2011-08-16: 3 [IU] via SUBCUTANEOUS

## 2011-08-13 MED ORDER — DOCUSATE SODIUM 100 MG PO CAPS
100.0000 mg | ORAL_CAPSULE | Freq: Two times a day (BID) | ORAL | Status: DC
  Administered 2011-08-13 – 2011-08-16 (×7): 100 mg via ORAL
  Filled 2011-08-13 (×8): qty 1

## 2011-08-13 MED ORDER — INDOCYANINE GREEN 25 MG IV SOLR
INTRAVENOUS | Status: DC | PRN
  Administered 2011-08-13: 7.5 mg via INTRAVENOUS

## 2011-08-13 MED ORDER — ROCURONIUM BROMIDE 100 MG/10ML IV SOLN
INTRAVENOUS | Status: DC | PRN
  Administered 2011-08-13: 5 mg via INTRAVENOUS
  Administered 2011-08-13: 10 mg via INTRAVENOUS
  Administered 2011-08-13: 5 mg via INTRAVENOUS
  Administered 2011-08-13: 50 mg via INTRAVENOUS
  Administered 2011-08-13 (×2): 10 mg via INTRAVENOUS

## 2011-08-13 MED ORDER — PANTOPRAZOLE SODIUM 40 MG IV SOLR
40.0000 mg | Freq: Once | INTRAVENOUS | Status: AC
  Administered 2011-08-13: 40 mg via INTRAVENOUS
  Filled 2011-08-13: qty 40

## 2011-08-13 MED ORDER — DIPHENHYDRAMINE HCL 50 MG/ML IJ SOLN
INTRAMUSCULAR | Status: AC
  Administered 2011-08-13: 12.5 mg
  Filled 2011-08-13: qty 1

## 2011-08-13 MED ORDER — DIPHENHYDRAMINE HCL 12.5 MG/5ML PO ELIX: 12.5000 mg | ORAL_SOLUTION | Freq: Four times a day (QID) | ORAL | Status: DC | PRN

## 2011-08-13 MED ORDER — DIPHENHYDRAMINE HCL 50 MG/ML IJ SOLN: 12.5000 mg | Freq: Four times a day (QID) | INTRAMUSCULAR | Status: DC | PRN

## 2011-08-13 SURGICAL SUPPLY — 73 items
ADH SKN CLS APL DERMABOND .7 (GAUZE/BANDAGES/DRESSINGS) ×2
APL ESCP 34 STRL LF DISP (HEMOSTASIS) ×1
APPLICATOR SURGIFLO ENDO (HEMOSTASIS) ×1 IMPLANT
BAG SPEC RTRVL LRG 6X4 10 (ENDOMECHANICALS) ×1
CANNULA SEAL DVNC (CANNULA) ×3 IMPLANT
CANNULA SEALS DA VINCI (CANNULA) ×3
CATH FOLEY 2WAY SLVR  5CC 16FR (CATHETERS) ×1
CATH FOLEY 2WAY SLVR 5CC 16FR (CATHETERS) IMPLANT
CHLORAPREP W/TINT 26ML (MISCELLANEOUS) ×2 IMPLANT
CLIP LIGATING HEM O LOK PURPLE (MISCELLANEOUS) IMPLANT
CLIP LIGATING HEMO O LOK GREEN (MISCELLANEOUS) ×1 IMPLANT
CLOTH BEACON ORANGE TIMEOUT ST (SAFETY) ×2 IMPLANT
CORDS BIPOLAR (ELECTRODE) ×2 IMPLANT
COVER SURGICAL LIGHT HANDLE (MISCELLANEOUS) ×2 IMPLANT
COVER TIP SHEARS 8 DVNC (MISCELLANEOUS) ×1 IMPLANT
COVER TIP SHEARS 8MM DA VINCI (MISCELLANEOUS) ×1
DECANTER SPIKE VIAL GLASS SM (MISCELLANEOUS) ×2 IMPLANT
DERMABOND ADVANCED (GAUZE/BANDAGES/DRESSINGS) ×2
DERMABOND ADVANCED .7 DNX12 (GAUZE/BANDAGES/DRESSINGS) ×2 IMPLANT
DRAIN CHANNEL 15F RND FF 3/16 (WOUND CARE) ×2 IMPLANT
DRAPE INCISE IOBAN 66X45 STRL (DRAPES) ×2 IMPLANT
DRAPE LAPAROSCOPIC ABDOMINAL (DRAPES) ×2 IMPLANT
DRAPE LG THREE QUARTER DISP (DRAPES) ×4 IMPLANT
DRAPE TABLE BACK 44X90 PK DISP (DRAPES) ×2 IMPLANT
DRAPE WARM FLUID 44X44 (DRAPE) ×2 IMPLANT
DRESSING SURGICEL FIBRLLR 1X2 (HEMOSTASIS) ×1 IMPLANT
DRSG SURGICEL FIBRILLAR 1X2 (HEMOSTASIS) ×2
ELECT REM PT RETURN 9FT ADLT (ELECTROSURGICAL) ×4
ELECTRODE REM PT RTRN 9FT ADLT (ELECTROSURGICAL) ×2 IMPLANT
EVACUATOR SILICONE 100CC (DRAIN) ×2 IMPLANT
GAUZE SPONGE 2X2 8PLY STRL LF (GAUZE/BANDAGES/DRESSINGS) IMPLANT
GAUZE VASELINE 3X9 (GAUZE/BANDAGES/DRESSINGS) IMPLANT
GLOVE BIO SURGEON STRL SZ 6.5 (GLOVE) ×2 IMPLANT
GLOVE BIOGEL M STRL SZ7.5 (GLOVE) ×4 IMPLANT
GOWN STRL NON-REIN LRG LVL3 (GOWN DISPOSABLE) ×6 IMPLANT
GOWN STRL REIN XL XLG (GOWN DISPOSABLE) ×2 IMPLANT
HEMOSTAT SURGICEL 4X8 (HEMOSTASIS) IMPLANT
KIT ACCESSORY DA VINCI DISP (KITS) ×1
KIT ACCESSORY DVNC DISP (KITS) ×1 IMPLANT
KIT BASIN OR (CUSTOM PROCEDURE TRAY) ×2 IMPLANT
KIT PROCEDURE DA VINCI SI (MISCELLANEOUS) ×1
KIT PROCEDURE DVNC SI (MISCELLANEOUS) IMPLANT
MARKER PEN SURG W/LABELS BLK (STERILIZATION PRODUCTS) ×1 IMPLANT
NS IRRIG 1000ML POUR BTL (IV SOLUTION) ×2 IMPLANT
PENCIL BUTTON HOLSTER BLD 10FT (ELECTRODE) ×2 IMPLANT
POSITIONER SURGICAL ARM (MISCELLANEOUS) ×4 IMPLANT
POUCH SPECIMEN RETRIEVAL 10MM (ENDOMECHANICALS) ×2 IMPLANT
SET TUBE IRRIG SUCTION NO TIP (IRRIGATION / IRRIGATOR) ×1 IMPLANT
SOLUTION ANTI FOG 6CC (MISCELLANEOUS) ×2 IMPLANT
SOLUTION ELECTROLUBE (MISCELLANEOUS) ×2 IMPLANT
SPONGE GAUZE 2X2 STER 10/PKG (GAUZE/BANDAGES/DRESSINGS) ×1
SPONGE LAP 18X18 X RAY DECT (DISPOSABLE) ×2 IMPLANT
SURGIFLO W/THROMBIN 8M KIT (HEMOSTASIS) ×2 IMPLANT
SUT ETHILON 3 0 PS 1 (SUTURE) ×2 IMPLANT
SUT MNCRL AB 4-0 PS2 18 (SUTURE) ×4 IMPLANT
SUT SILK 3 0 SH 30 (SUTURE) ×2 IMPLANT
SUT V-LOC BARB 180 2/0GR6 GS22 (SUTURE) ×4
SUT V-LOC BARB 180 2/0GR9 GS23 (SUTURE)
SUT VIC AB 0 CT1 27 (SUTURE) ×4
SUT VIC AB 0 CT1 27XBRD ANTBC (SUTURE) ×2 IMPLANT
SUT VICRYL 0 UR6 27IN ABS (SUTURE) ×5 IMPLANT
SUT VLOC BARB 180 ABS3/0GR12 (SUTURE) ×2
SUTURE V-LC BRB 180 2/0GR6GS22 (SUTURE) IMPLANT
SUTURE V-LC BRB 180 2/0GR9GS23 (SUTURE) IMPLANT
SUTURE VLOC BRB 180 ABS3/0GR12 (SUTURE) IMPLANT
SYR BULB IRRIGATION 50ML (SYRINGE) IMPLANT
TOWEL OR NON WOVEN STRL DISP B (DISPOSABLE) ×2 IMPLANT
TRAY FOLEY CATH 14FRSI W/METER (CATHETERS) ×2 IMPLANT
TRAY LAP CHOLE (CUSTOM PROCEDURE TRAY) ×2 IMPLANT
TROCAR ENDOPATH XCEL 12X100 BL (ENDOMECHANICALS) ×2 IMPLANT
TROCAR XCEL 12X100 BLDLESS (ENDOMECHANICALS) ×2 IMPLANT
TUBING INSUFFLATION 10FT LAP (TUBING) ×2 IMPLANT
WATER STERILE IRR 1500ML POUR (IV SOLUTION) ×4 IMPLANT

## 2011-08-13 NOTE — Transfer of Care (Signed)
Immediate Anesthesia Transfer of Care Note  Patient: [name] [name]  Procedure(s) Performed: Procedure(s) (LRB): ROBOTIC ASSITED PARTIAL NEPHRECTOMY (Right)  Patient Location: PACU  Anesthesia Type: General  Level of Consciousness: awake, alert , oriented and patient cooperative  Airway & Oxygen Therapy: Patient Spontanous Breathing and Patient connected to face mask oxygen  Post-op Assessment: Report given to PACU RN, Post -op Vital signs reviewed and stable and Patient moving all extremities  Post vital signs: Reviewed and stable  Complications: No apparent anesthesia complications

## 2011-08-13 NOTE — Op Note (Addendum)
Preoperative diagnosis: Right renal neoplasm  Postoperative diagnosis: RIght renal neoplasm  Procedure:  1. Right robotic-assisted laparoscopic partial nephrectomy  Surgeon: Moody Bruins. M.D.  Assistant(s):  Pecola Leisure, PA-C  Anesthesia: General  Complications: None  EBL: 100 mL  IVF:  3000 mL crystalloid  Specimens: 1. Right renal neoplasm  Disposition of specimens: Pathology  Intraoperative findings:       1. Warm renal ischemia time: 31 minutes  Drains: 1. # 15 Blake perinephric drain  Indication:  [name] [name] is a 61 y.o. year old patient with a right renal neoplasm.  After a thorough review of the management options for their renal mass, they elected to proceed with surgical treatment and the above procedure.  We have discussed the potential benefits and risks of the procedure, side effects of the proposed treatment, the likelihood of the patient achieving the goals of the procedure, and any potential problems that might occur during the procedure or recuperation. Informed consent has been obtained.   Description of procedure:  The patient was taken to the operating room and a general anesthetic was administered. The patient was given preoperative antibiotics, placed in the right modified flank position with care to pad all potential pressure points, and prepped and draped in the usual sterile fashion. Next a preoperative timeout was performed.  A site was selected on the right side of the umbilicus for placement of the camera port. This was placed using a standard open Hassan technique which allowed entry into the peritoneal cavity under direct vision and without difficulty. A 12 mm port was placed and a pneumoperitoneum established. The camera was then used to inspect the abdomen and there was no evidence of any intra-abdominal injuries or other abnormalities. The remaining abdominal ports were then placed. 8 mm robotic ports were placed in the right  upper quadrant, right lower quadrant, and far right lateral abdominal wall. A 12 mm port was placed in the upper midline for laparoscopic assistance. All ports were placed under direct vision without difficulty. The surgical cart was then docked.   Utilizing the cautery scissors, the white line of Toldt was incised allowing the colon to be mobilized medially and the plane between the mesocolon and the anterior layer of Gerota's fascia to be developed and the kidney to be exposed. The duodenum was adherent to the anterior surface of the kidney and required more dissection than is typical for mobilization.  Although no injury to the duodenum was noted during this dissection, there was an area on the serosa that demonstrated a small area of cautery effect.  In order to prevent manifestation of a possible late cautery injury, two 3-0 silk Lembert sutures were placed to oversew the serosa in this area.  The ureter and gonadal vein were identified inferiorly and the ureter was lifted anteriorly off the psoas muscle.  Dissection proceeded superiorly along the gonadal vein until the renal vein was identified.  The renal hilum was then carefully isolated with a combination of blunt and sharp dissection allowing the renal arterial and venous structures to be separated and isolated in preparation for renal hilar vessel clamping. She had an anterior lower pole renal artery and lower pole renal vein and a branching main renal artery and main renal vein.  12.5 g of IV mannitol was then administered. The renal collecting system was identified and the ureter and renal pelvis was seen to extended just posterior to the renal mass.  Attention turned to the kidney and the  perinephric fat surrounding the renal mass was removed and the kidney was mobilized sufficiently for exposure and resection of the renal mass. She had significant perinephric fat which caused this dissection to take an extended time.  Once the renal mass was  properly isolated, preparations were made for resection of the tumor.  Reconstructive sutures were placed into the abdomen for the renorrhaphy portion of the procedure.  The lower pole renal artery and vein and the lower branch of the main renal artery were then clamped with bulldog clamps for selective arterial clamping. Indocyanine green dye was then administered and the lower pole of the kidney appeared ischemic but the mass did extend into the vascularized portion of the kidney precluding selective clamping. Therefore, the main renal artery was clamped.  The tumor was then excised with cold scissor dissection along with an adequate visible gross margin of normal renal parenchyma. The tumor appeared to be excised without any gross violation of the tumor. The renal collecting system was entered during removal of the tumor.  A running 2-0 V-lock suture was then brought through the capsule of the kidney and run along the base of the renal defect to provide hemostasis and close any entry into the renal collecting system. Weck clips were used to secure this suture outside the renal capsule at the proximal and distal ends. Surgiflo was then placed into the renal defect and the renal parenchyma was closed with a 2-0 V-lock horizontal mattress capsular suture which resulted in excellent compression of the renal defect.    The bulldog clamps were then removed from the renal hilar vessel(s) and an additional 12.5 g of IV mannitol was administered. Total warm renal ischemia time was 31 minutes. The renal tumor resection site was examined. Hemostasis appeared adequate.   The kidney was placed back into its normal anatomic position and covered with perinephric fat as needed.  A # 15 Blake drain was then brought through the lateral lower port site and positioned in the perinephric space.  It was secured to the skin with a nylon suture. The surgical cart was undocked.  The renal tumor specimen was removed intact within an  endopouch retrieval bag via the camera port sites.  The camera port site and the other 12 mm port site were then closed at the fascial level with 0-vicryl suture.  All other laparoscopic/robotic ports were removed under direct vision and the pneumoperitoneum let down with inspection of the operative field performed and hemostasis again confirmed. All incision sites were then injected with local anesthetic and reapproximated at the skin level with 4-0 monocryl subcuticular closures.  Dermabond was applied to the skin.  The patient tolerated the procedure well and without complications.  The patient was able to be extubated and transferred to the recovery unit in satisfactory condition.  Moody Bruins MD

## 2011-08-13 NOTE — Anesthesia Postprocedure Evaluation (Signed)
  Anesthesia Post-op Note  Patient: [name] [name]  Procedure(s) Performed: Procedure(s) (LRB): ROBOTIC ASSITED PARTIAL NEPHRECTOMY (Right)  Patient Location: PACU  Anesthesia Type: General  Level of Consciousness: awake and alert   Airway and Oxygen Therapy: Patient Spontanous Breathing  Post-op Pain: mild  Post-op Assessment: Post-op Vital signs reviewed, Patient's Cardiovascular Status Stable, Respiratory Function Stable, Patent Airway and No signs of Nausea or vomiting  Post-op Vital Signs: stable  Complications: No apparent anesthesia complications

## 2011-08-13 NOTE — Interval H&P Note (Signed)
History and Physical Interval Note:  08/13/2011 7:11 AM  [name] [name]  has presented today for surgery, with the diagnosis of RIGHT RENAL NEOPLASM  The various methods of treatment have been discussed with the patient and family. After consideration of risks, benefits and other options for treatment, the patient has consented to  Procedure(s): ROBOTIC ASSITED PARTIAL NEPHRECTOMY (Right) as a surgical intervention .  The patient's history has been reviewed, patient examined, no change in status, stable for surgery.  I have reviewed the patient's chart and labs.  Questions were answered to the patient's satisfaction.     Francis Doenges,LES

## 2011-08-13 NOTE — Progress Notes (Signed)
Patient ID: [name] [name], female   DOB: 1950/08/31, 61 y.o.   MRN: [id number]  Post-op note  Subjective: The patient is doing well.  No complaints.  Objective: Vital signs in last 24 hours: Temp:  [97.5 F (36.4 C)-98.2 F (36.8 C)] 97.6 F (36.4 C) (08/01 1751) Pulse Rate:  [73-93] 79  (08/01 1751) Resp:  [12-23] 16  (08/01 1751) BP: (103-159)/(61-75) 103/66 mmHg (08/01 1751) SpO2:  [93 %-97 %] 96 % (08/01 1751) Weight:  [99.791 kg (220 lb)] 99.791 kg (220 lb) (08/01 1308)  Intake/Output from previous day:   Intake/Output this shift:    Physical Exam:  General: Alert and oriented. Abdomen: Soft, Nondistended. Incisions: Clean and dry.  Lab Results:  Basename 08/13/11 1143 08/11/11 1225  HGB 11.6* 12.4  HCT 34.9* 36.9    Assessment/Plan: POD#0   1) Continue to monitor   Moody Bruins. MD   LOS: 0 days   Jiayi Lengacher,LES 08/13/2011, 7:24 PM

## 2011-08-13 NOTE — Progress Notes (Signed)
Report given to Baxter Hire, RN at 2300 to assume care of patient.  Earnest Conroy. Clelia Croft, RN

## 2011-08-13 NOTE — Preoperative (Signed)
Beta Blockers   Reason not to administer Beta Blockers:Not Applicable 

## 2011-08-14 LAB — BASIC METABOLIC PANEL
CO2: 28 mEq/L (ref 19–32)
Chloride: 98 mEq/L (ref 96–112)
Glucose, Bld: 145 mg/dL — ABNORMAL HIGH (ref 70–99)
Sodium: 135 mEq/L (ref 135–145)

## 2011-08-14 LAB — HEMOGLOBIN AND HEMATOCRIT, BLOOD
HCT: 30.7 % — ABNORMAL LOW (ref 36.0–46.0)
Hemoglobin: 10.2 g/dL — ABNORMAL LOW (ref 12.0–15.0)
Hemoglobin: 10.1 g/dL — ABNORMAL LOW (ref 12.0–15.0)

## 2011-08-14 LAB — GLUCOSE, CAPILLARY
Glucose-Capillary: 127 mg/dL — ABNORMAL HIGH (ref 70–99)
Glucose-Capillary: 133 mg/dL — ABNORMAL HIGH (ref 70–99)
Glucose-Capillary: 181 mg/dL — ABNORMAL HIGH (ref 70–99)

## 2011-08-14 MED ORDER — HYDROCODONE-ACETAMINOPHEN 5-325 MG PO TABS
1.0000 | ORAL_TABLET | Freq: Four times a day (QID) | ORAL | Status: DC | PRN
  Administered 2011-08-14 – 2011-08-16 (×8): 2 via ORAL
  Filled 2011-08-14 (×8): qty 2

## 2011-08-14 MED ORDER — BISACODYL 10 MG RE SUPP
10.0000 mg | Freq: Once | RECTAL | Status: AC
  Administered 2011-08-14: 10 mg via RECTAL
  Filled 2011-08-14: qty 1

## 2011-08-14 MED ORDER — HYDROCODONE-ACETAMINOPHEN 5-325 MG PO TABS: 1.0000 | ORAL_TABLET | Freq: Four times a day (QID) | ORAL | Status: AC | PRN

## 2011-08-14 MED ORDER — DOCUSATE SODIUM 100 MG PO CAPS: 100.0000 mg | ORAL_CAPSULE | Freq: Two times a day (BID) | ORAL | Status: DC

## 2011-08-14 MED ORDER — IBUPROFEN 800 MG PO TABS
400.0000 mg | ORAL_TABLET | ORAL | Status: DC | PRN
  Administered 2011-08-14: 400 mg via ORAL
  Filled 2011-08-14: qty 1

## 2011-08-14 MED ORDER — PANTOPRAZOLE SODIUM 40 MG PO TBEC
40.0000 mg | DELAYED_RELEASE_TABLET | Freq: Once | ORAL | Status: AC
  Administered 2011-08-14: 40 mg via ORAL
  Filled 2011-08-14: qty 1

## 2011-08-14 NOTE — Progress Notes (Signed)
Pt pre-medicated with 2 tabs Vicodin, waited 1 hr. Pt sat on side of bed, walked to bathroom, had bath, walked to chair.  Pt did not want to walk in halls.   Pt c/o 7/10 pain, medicated with 2mg  morphine. Now resting comfortably in chair.

## 2011-08-14 NOTE — Progress Notes (Signed)
Patient ID: [name] [name], female   DOB: 1950/02/23, 61 y.o.   MRN: [id number]  Doing well.  Tolerating liquids.  Voiding every 10 minutes.  Path pending.  -- Check PVR to ensure appropriate emptying -- Slowly advance diet -- Check JP Cr in am -- May be able to go home Saturday but otherwise Sunday

## 2011-08-14 NOTE — Progress Notes (Signed)
PVR 37 mL. -Levonne Lapping, RN

## 2011-08-14 NOTE — Progress Notes (Signed)
Patient ID: JOANNY DUPREE, female   DOB: 08-Dec-1950, 61 y.o.   MRN: [id number]  1 Day Post-Op Subjective: The patient is doing well.  No nausea or vomiting. Pain is adequately controlled.  Objective: Vital signs in last 24 hours: Temp:  [97.5 F (36.4 C)-98.6 F (37 C)] 98.6 F (37 C) (08/02 0606) Pulse Rate:  [78-93] 88  (08/02 0606) Resp:  [12-23] 16  (08/02 0606) BP: (103-159)/(61-77) 159/77 mmHg (08/02 0606) SpO2:  [93 %-97 %] 97 % (08/02 0606) Weight:  [99.791 kg (220 lb)] 99.791 kg (220 lb) (08/01 1308)  Intake/Output from previous day: 08/01 0701 - 08/02 0700 In: 4285 [I.V.:4035; IV Piggyback:250] Out: 2685 [Urine:2300; Drains:285; Blood:100] Intake/Output this shift:    Physical Exam:  General: Alert and oriented. CV: RRR Lungs: Clear bilaterally. GI: Soft, Nondistended. Incisions: Clean and dry. Urine: Clear Extremities: Nontender, no erythema, no edema.  Lab Results:  Basename 08/14/11 0430 08/13/11 1143 08/11/11 1225  HGB 10.2* 11.6* 12.4  HCT 30.7* 34.9* 36.9          Basename 08/14/11 0430 08/13/11 1143 08/11/11 1230  CREATININE 1.11* 0.78 0.69           Results for orders placed during the hospital encounter of 08/13/11 (from the past 24 hour(s))  GLUCOSE, CAPILLARY     Status: Abnormal   Collection Time   08/13/11 11:30 AM      Component Value Range   Glucose-Capillary 145 (*) 70 - 99 mg/dL   Comment 1 Documented in Chart     Comment 2 Notify RN    BASIC METABOLIC PANEL     Status: Abnormal   Collection Time   08/13/11 11:43 AM      Component Value Range   Sodium 137  135 - 145 mEq/L   Potassium 3.7  3.5 - 5.1 mEq/L   Chloride 100  96 - 112 mEq/L   CO2 25  19 - 32 mEq/L   Glucose, Bld 158 (*) 70 - 99 mg/dL   BUN 12  6 - 23 mg/dL   Creatinine, Ser 5.28  0.50 - 1.10 mg/dL   Calcium 8.9  8.4 - 41.3 mg/dL   GFR calc non Af Amer 88 (*) >90 mL/min   GFR calc Af Amer >90  >90 mL/min  HEMOGLOBIN AND HEMATOCRIT, BLOOD     Status: Abnormal   Collection Time   08/13/11 11:43 AM      Component Value Range   Hemoglobin 11.6 (*) 12.0 - 15.0 g/dL   HCT 24.4 (*) 01.0 - 27.2 %  GLUCOSE, CAPILLARY     Status: Abnormal   Collection Time   08/13/11  3:55 PM      Component Value Range   Glucose-Capillary 205 (*) 70 - 99 mg/dL  GLUCOSE, CAPILLARY     Status: Abnormal   Collection Time   08/13/11  8:04 PM      Component Value Range   Glucose-Capillary 144 (*) 70 - 99 mg/dL   Comment 1 Notify RN     Comment 2 Documented in Chart    GLUCOSE, CAPILLARY     Status: Abnormal   Collection Time   08/13/11 11:56 PM      Component Value Range   Glucose-Capillary 148 (*) 70 - 99 mg/dL   Comment 1 Notify RN     Comment 2 Documented in Chart    GLUCOSE, CAPILLARY     Status: Abnormal   Collection Time   08/14/11  4:12 AM  Component Value Range   Glucose-Capillary 140 (*) 70 - 99 mg/dL  BASIC METABOLIC PANEL     Status: Abnormal   Collection Time   08/14/11  4:30 AM      Component Value Range   Sodium 135  135 - 145 mEq/L   Potassium 4.1  3.5 - 5.1 mEq/L   Chloride 98  96 - 112 mEq/L   CO2 28  19 - 32 mEq/L   Glucose, Bld 145 (*) 70 - 99 mg/dL   BUN 11  6 - 23 mg/dL   Creatinine, Ser 0.45 (*) 0.50 - 1.10 mg/dL   Calcium 8.5  8.4 - 40.9 mg/dL   GFR calc non Af Amer 52 (*) >90 mL/min   GFR calc Af Amer 60 (*) >90 mL/min  HEMOGLOBIN AND HEMATOCRIT, BLOOD     Status: Abnormal   Collection Time   08/14/11  4:30 AM      Component Value Range   Hemoglobin 10.2 (*) 12.0 - 15.0 g/dL   HCT 81.1 (*) 91.4 - 78.2 %  GLUCOSE, CAPILLARY     Status: Abnormal   Collection Time   08/14/11  7:27 AM      Component Value Range   Glucose-Capillary 144 (*) 70 - 99 mg/dL    Assessment/Plan: POD# 1 s/p robotic partial nephrectomy.  1) Ambulate, Incentive spirometry 2) Advance diet as tolerated 3) Transition to oral pain medication 4) Dulcolax suppository 5) D/C urethral catheter   Moody Bruins. MD   LOS: 1 day   Moselle Rister,LES 08/14/2011, 8:02  AM

## 2011-08-15 LAB — BASIC METABOLIC PANEL
CO2: 29 mEq/L (ref 19–32)
Chloride: 104 mEq/L (ref 96–112)
Glucose, Bld: 137 mg/dL — ABNORMAL HIGH (ref 70–99)
Potassium: 3.8 mEq/L (ref 3.5–5.1)
Sodium: 138 mEq/L (ref 135–145)

## 2011-08-15 LAB — GLUCOSE, CAPILLARY
Glucose-Capillary: 120 mg/dL — ABNORMAL HIGH (ref 70–99)
Glucose-Capillary: 140 mg/dL — ABNORMAL HIGH (ref 70–99)
Glucose-Capillary: 113 mg/dL — ABNORMAL HIGH (ref 70–99)
Glucose-Capillary: 173 mg/dL — ABNORMAL HIGH (ref 70–99)

## 2011-08-15 LAB — CREATININE, FLUID (PLEURAL, PERITONEAL, JP DRAINAGE): Creat, Fluid: 1.1 mg/dL

## 2011-08-15 NOTE — Progress Notes (Signed)
Patient ID: [name] [name], female   DOB: February 15, 1950, 61 y.o.   MRN: [id number] 2 Days Post-Op  Subjective: [name] [name] continues to have some intermittant right flank pain.  She has no nausea and has had flatus and a BM and is tolerating her full liquids.   She had a headache last night that responded to ibuprofen.  She had maxed out her tylenol in her pain med.  The headache has resolved.  Her JP put out 150cc over night.  The creatinine on the drain fluid is pending.  She doesn't feel ready for discharge today. ROS: Negative except as above  Objective: Vital signs in last 24 hours: Temp:  [97.7 F (36.5 C)-100.2 F (37.9 C)] 97.7 F (36.5 C) (08/03 0528) Pulse Rate:  [88-101] 88  (08/03 0528) Resp:  [16-18] 16  (08/03 0528) BP: (111-154)/(58-82) 111/58 mmHg (08/03 0528) SpO2:  [93 %-96 %] 93 % (08/03 0528)  Intake/Output from previous day: 08/02 0701 - 08/03 0700 In: 1860 [P.O.:960; I.V.:900] Out: 2651 [Urine:2500; Drains:150; Stool:1] Intake/Output this shift:    General appearance: alert and no distress Resp: clear to auscultation bilaterally Cardio: regular rate and rhythm GI: soft, obese with positive BS.  She has some RUQ tenderness.  Her wounds are intact and dressing dry.  there is minimal serosanguinous JP drainage now.   Lab Results:   Basename 08/14/11 0852 08/14/11 0430  WBC -- --  HGB 10.1* 10.2*  HCT 30.4* 30.7*  PLT -- --   BMET  Basename 08/15/11 0553 08/14/11 0430  NA 138 135  K 3.8 4.1  CL 104 98  CO2 29 28  GLUCOSE 137* 145*  BUN 9 11  CREATININE 1.06 1.11*  CALCIUM 9.1 8.5   PT/INR No results found for this basename: LABPROT:2,INR:2 in the last 72 hours ABG No results found for this basename: PHART:2,PCO2:2,PO2:2,HCO3:2 in the last 72 hours  Studies/Results: No results found.  Anti-infectives: Anti-infectives     Start     Dose/Rate Route Frequency Ordered Stop   08/13/11 1400   ceFAZolin (ANCEF) IVPB 1 g/50 mL premix        1 g 100  mL/hr over 30 Minutes Intravenous Every 8 hours 08/13/11 1303 08/13/11 2156   08/13/11 0519   ceFAZolin (ANCEF) IVPB 2 g/50 mL premix        2 g 100 mL/hr over 30 Minutes Intravenous 30 min pre-op 08/13/11 0519 08/13/11 0725          Current Facility-Administered Medications  Medication Dose Route Frequency Provider Last Rate Last Dose  . bisacodyl (DULCOLAX) suppository 10 mg  10 mg Rectal Once [name] [name]   10 mg at 08/14/11 1107  . dextrose 5 %-0.45 % sodium chloride infusion   Intravenous Continuous [name] [name] 75 mL/hr at 08/15/11 0154    . diphenhydrAMINE (BENADRYL) injection 12.5-25 mg  12.5-25 mg Intravenous Q6H PRN [name] [name]       Or  . diphenhydrAMINE (BENADRYL) 12.5 MG/5ML elixir 12.5-25 mg  12.5-25 mg Oral Q6H PRN [name] [name]      . docusate sodium (COLACE) capsule 100 mg  100 mg Oral BID [name] [name]   100 mg at 08/14/11 2138  . HYDROcodone-acetaminophen (NORCO/VICODIN) 5-325 MG per tablet 1-2 tablet  1-2 tablet Oral Q6H PRN [name] [name]   2 tablet at 08/15/11 0757  . insulin aspart (novoLOG) injection 0-20 Units  0-20 Units Subcutaneous Q4H [name] [name]   3 Units at  08/15/11 0757  . morphine 2 MG/ML injection 2-4 mg  2-4 mg Intravenous Q2H PRN [name] [name]   2 mg at 08/14/11 1950  . ondansetron (ZOFRAN) injection 4 mg  4 mg Intravenous Q4H PRN [name] [name]      . pantoprazole (PROTONIX) EC tablet 40 mg  40 mg Oral Once [name] [name]   40 mg at 08/14/11 1106  . PARoxetine (PAXIL) tablet 20 mg  20 mg Oral Daily [name] [name]   20 mg at 08/14/11 1105  . simvastatin (ZOCOR) tablet 40 mg  40 mg Oral QHS [name] [name]   40 mg at 08/14/11 2138  . zolpidem (AMBIEN) tablet 5 mg  5 mg Oral QHS PRN [name] [name]   5 mg at 08/13/11 2127  . DISCONTD: ibuprofen (ADVIL,MOTRIN) tablet 400 mg  400 mg Oral Q4H PRN [name] Crete, [name]   400 mg at 08/14/11 1945    Assessment: s/p Procedure(s): ROBOTIC ASSITED PARTIAL NEPHRECTOMY  I will observe her for another  day. Removal of drain will depend on Cr.   Plan: Advance diet.    LOS: 2 days    [name] [name] 08/15/2011

## 2011-08-16 LAB — GLUCOSE, CAPILLARY: Glucose-Capillary: 115 mg/dL — ABNORMAL HIGH (ref 70–99)

## 2011-08-16 NOTE — Progress Notes (Signed)
Pt discharged to home. DC instructions given using teach back method with family at bedside. No concerns voiced. Prescriptions x 1 given for pain med. Pt also medicated for pain at 7/10 to abdomen prior to leaving. Left unit in wheelchair pushed by nurse tech. Left in good condition. Vwilliams,rn.

## 2011-08-16 NOTE — Discharge Summary (Signed)
Physician Discharge Summary  Patient ID: [name] [name] MRN: [id number] DOB/AGE: 1950-11-01 61 y.o.  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 *    Discharged Condition: good  Hospital Course: [name] [name] had a robot assisted laparoscopic right partial nephrectomy for a 5.2cm renal mass.  Her post op course has been unremarkable.  Her JP Cr level is 1.1 and the drain will be removed prior to discharge today.  She has some pain but is otherwise doing well without complaints and is tolerating a diet.  She is voiding well.    Consults: None  Significant Diagnostic Studies: Routine post op labs   Treatments: surgery: as above  Discharge Exam: Blood pressure 138/75, pulse 80, temperature 98.2 F (36.8 C), temperature source Oral, resp. rate 18, height 5\' 6"  (1.676 m), weight 101.7 kg (224 lb 3.3 oz), SpO2 94.00%. General appearance: alert and no distress Resp: clear to auscultation bilaterally Cardio: regular rate and rhythm GI: Soft obese with +BS.  Wounds intact.  JP in right flank draining serosanguinous fluid.    Disposition: To home  Discharge Orders    Future Appointments: Provider: Department: Dept Phone: Center:   10/05/2011 11:30 AM Randall An, MD [facility] [phone] None     Future Orders Please Complete By Expires   Discontinue JP/Blake drain        Medication List  As of 08/16/2011  9:06 AM   STOP taking these medications         acetaminophen 500 MG tablet      Fish Oil 1200 MG Caps         TAKE these medications         glipiZIDE 10 MG 24 hr tablet   Commonly known as: GLUCOTROL XL   Take 10 mg by mouth 2 (two) times daily.      HYDROcodone-acetaminophen 5-325 MG per tablet   Commonly known as: NORCO/VICODIN   Take 1-2 tablets by mouth every 6 (six) hours as needed (pain).      lisinopril-hydrochlorothiazide 20-12.5 MG per tablet   Commonly known as: PRINZIDE,ZESTORETIC   Take 1 tablet by mouth daily with breakfast.      meloxicam 15 MG tablet   Commonly known as: MOBIC   Take 15 mg by mouth daily with breakfast.      metformin 1000 MG (OSM) 24 hr tablet   Commonly known as: FORTAMET   Take 1,000 mg by mouth 2 (two) times daily.      PARoxetine 20 MG tablet   Commonly known as: PAXIL   Take 20 mg by mouth every morning.      simvastatin 40 MG tablet   Commonly known as: ZOCOR   Take 40 mg by mouth at bedtime.           Follow-up Information    Follow up with Crecencio Mc, MD on 09/08/2011. (at 10:15)    Contact information:   [address] [place], [address] [facility] [place] [address] [phone]          Signed: Anner Crete 08/16/2011, 9:06 AM

## 2011-08-24 ENCOUNTER — Other Ambulatory Visit (HOSPITAL_COMMUNITY): Payer: Self-pay | Admitting: Oncology

## 2011-08-24 DIAGNOSIS — R9230 Dense breasts, unspecified: Secondary | ICD-10-CM

## 2011-08-24 DIAGNOSIS — R922 Inconclusive mammogram: Secondary | ICD-10-CM

## 2011-09-02 ENCOUNTER — Ambulatory Visit
Admission: RE | Admit: 2011-09-02 | Discharge: 2011-09-02 | Disposition: A | Discharge status: Home / Self Care | Payer: No Typology Code available for payment source | Source: Ambulatory Visit | Attending: Oncology | Admitting: Oncology

## 2011-09-02 DIAGNOSIS — R922 Inconclusive mammogram: Secondary | ICD-10-CM

## 2011-10-05 ENCOUNTER — Encounter (HOSPITAL_COMMUNITY): Payer: Medicaid Other | Attending: Oncology | Admitting: Oncology

## 2011-10-05 VITALS — BP 121/76 | HR 82 | Temp 98.2°F | Resp 18 | Wt 210.0 lb

## 2011-10-05 DIAGNOSIS — C649 Malignant neoplasm of unspecified kidney, except renal pelvis: Secondary | ICD-10-CM

## 2011-10-05 DIAGNOSIS — R918 Other nonspecific abnormal finding of lung field: Secondary | ICD-10-CM

## 2011-10-05 NOTE — Progress Notes (Signed)
Problem #1 renal cell carcinoma grade 4, 5.2 cm in size status post partial nephrectomy by robotic surgery. She had clear margins no vascular invasion of the renal vessels. She did however have 2 and possibly 3 small pulmonary nodules that were found on the CT scan of the PET scan. She needs a followup CT scan which we will schedule for the first week in November.  Her incisions are doing well she occasionally has little hot poker-like pain in the 1 incision area. She is otherwise doing well. The CAT scan also suggested possibly early cirrhosis changes. She is is trying to lose weight by dietary changes.  We will get a CAT scan scheduled we will see her in 6 months but she will call us the same day that she finishes her CAT scan. We will make decisions based upon that.

## 2011-10-05 NOTE — Patient Instructions (Addendum)
[facility]  Discharge Instructions  RECOMMENDATIONS MADE BY THE CONSULTANT AND ANY TEST RESULTS WILL BE SENT TO YOUR REFERRING DOCTOR.   EXAM FINDINGS BY MD TODAY AND SIGNS AND SYMPTOMS TO REPORT TO CLINIC OR PRIMARY MD: Exam and discussion per MD.  Will check CT of chest in early November and if there is anything going on we will call you.  Continue with your weight loss.  MEDICATIONS PRESCRIBED: none   INSTRUCTIONS GIVEN AND DISCUSSED: Other :  Report any new lumps, shortness of breath or other problems.  SPECIAL INSTRUCTIONS/FOLLOW-UP: Xray Studies Needed CT Scan in November  and Return to Clinic in 6 months.   I acknowledge that I have been informed and understand all the instructions given to me and received a copy. I do not have any more questions at this time, but understand that I may call the [facility] at [facility] at [phone] during business hours should I have any further questions or need assistance in obtaining follow-up care.    __________________________________________  _____________  __________ Signature of Patient or Authorized Representative            Date                   Time    __________________________________________ Nurse's Signature

## 2011-10-28 ENCOUNTER — Ambulatory Visit (HOSPITAL_COMMUNITY)
Admission: RE | Admit: 2011-10-28 | Discharge: 2011-10-28 | Disposition: A | Discharge status: Home / Self Care | Payer: Medicaid Other | Source: Ambulatory Visit | Attending: Orthopedic Surgery | Admitting: Orthopedic Surgery

## 2011-10-28 DIAGNOSIS — I10 Essential (primary) hypertension: Secondary | ICD-10-CM | POA: Insufficient documentation

## 2011-10-28 DIAGNOSIS — M25673 Stiffness of unspecified ankle, not elsewhere classified: Secondary | ICD-10-CM | POA: Insufficient documentation

## 2011-10-28 DIAGNOSIS — M25579 Pain in unspecified ankle and joints of unspecified foot: Secondary | ICD-10-CM | POA: Insufficient documentation

## 2011-10-28 DIAGNOSIS — E119 Type 2 diabetes mellitus without complications: Secondary | ICD-10-CM | POA: Insufficient documentation

## 2011-10-28 DIAGNOSIS — M25571 Pain in right ankle and joints of right foot: Secondary | ICD-10-CM | POA: Insufficient documentation

## 2011-10-28 DIAGNOSIS — R262 Difficulty in walking, not elsewhere classified: Secondary | ICD-10-CM | POA: Insufficient documentation

## 2011-10-28 DIAGNOSIS — IMO0001 Reserved for inherently not codable concepts without codable children: Secondary | ICD-10-CM | POA: Insufficient documentation

## 2011-10-28 DIAGNOSIS — M6281 Muscle weakness (generalized): Secondary | ICD-10-CM | POA: Insufficient documentation

## 2011-10-28 DIAGNOSIS — M25676 Stiffness of unspecified foot, not elsewhere classified: Secondary | ICD-10-CM | POA: Insufficient documentation
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
## 2011-11-02 ENCOUNTER — Ambulatory Visit (HOSPITAL_COMMUNITY)
Admission: RE | Admit: 2011-11-02 | Discharge: 2011-11-02 | Disposition: A | Discharge status: Home / Self Care | Payer: Medicaid Other | Source: Ambulatory Visit | Attending: Orthopedic Surgery | Admitting: Orthopedic Surgery

## 2011-11-02 DIAGNOSIS — J4489 Other specified chronic obstructive pulmonary disease: Secondary | ICD-10-CM | POA: Insufficient documentation

## 2011-11-02 DIAGNOSIS — M6281 Muscle weakness (generalized): Secondary | ICD-10-CM | POA: Insufficient documentation

## 2011-11-02 DIAGNOSIS — J449 Chronic obstructive pulmonary disease, unspecified: Secondary | ICD-10-CM | POA: Insufficient documentation

## 2011-11-02 DIAGNOSIS — R262 Difficulty in walking, not elsewhere classified: Secondary | ICD-10-CM | POA: Insufficient documentation

## 2011-11-02 DIAGNOSIS — M25579 Pain in unspecified ankle and joints of unspecified foot: Secondary | ICD-10-CM | POA: Insufficient documentation

## 2011-11-02 DIAGNOSIS — IMO0001 Reserved for inherently not codable concepts without codable children: Secondary | ICD-10-CM | POA: Insufficient documentation

## 2011-11-02 DIAGNOSIS — I10 Essential (primary) hypertension: Secondary | ICD-10-CM | POA: Insufficient documentation

## 2011-11-02 DIAGNOSIS — E119 Type 2 diabetes mellitus without complications: Secondary | ICD-10-CM | POA: Insufficient documentation

## 2011-11-02 NOTE — Progress Notes (Signed)
Physical Therapy Treatment Patient Details  Name: [name] [name] MRN: [id number] Date of Birth: 1950-09-11  Today's Date: 11/02/2011 Time: 4098-1191 PT Time Calculation (min): 33 min Visit#: 2  of 8   Re-eval: 11/27/11 Charges:  therex 30'  Subjective: Symptoms/Limitations Symptoms: Pt. states yesterday was the first day wearing tennis shoes (was wearing a CAM boot).  States she noticed no increase in swelling or pain, just some "pins and needles" feeling when first initiating  ambulation.   Pt .came to clinic today without AD. Pain Assessment Currently in Pain?: Yes Pain Score:   2 Pain Location: Foot Pain Orientation: Right   Exercise/Treatments Stretches Quad Stretch: 1 rep;30 seconds Standing Functional Squat: 10 reps SLS: 30" max R LE only, tandem stance 30" each Other Standing Knee Exercises: heelraise/toeraise 10 reps Seated Other Seated Knee Exercises: towel crunches 2', towel inv/ever 5 reps each Other Seated Knee Exercises: theraband ankle all ways 10 reps each (teaching self)  Supine Straight Leg Raises: 10 reps Sidelying Hip ABduction: Right;10 reps Hip ADduction: Right;10 reps Clams: 10 reps Prone  Hip Extension: 10 reps     Physical Therapy Assessment and Plan PT Assessment and Plan Clinical Impression Statement: Pt. with questions regarding HEP; reviewed but will need further reinforcement for correct form (esp. with theraband exercises).  Pt able to complete new exercises to improve stability without c/o pain. PT Plan: Continue to improve LE strength and ankle stability. Progress balance activities and review HEP.   Caution on modalities secondary to slow healing fractures in foot     Problem List Patient Active Problem List  Diagnosis  . TINEA PEDIS  . DIABETES MELLITUS, TYPE II  . HYPERLIPIDEMIA  . DEPRESSION  . HYPERTENSION  . COPD  . CONSTIPATION NOS  . OSTEOARTHRITIS  . LOW BACK PAIN, CHRONIC  . BURSITIS, HIPS, BILATERAL  . FIBROMYALGIA   . FATIGUE  . HEADACHE  . URINARY INCONTINENCE  . ABNORMAL ELECTROCARDIOGRAM  . Right ankle pain  . Difficulty in walking    PT - End of Session Activity Tolerance: Patient tolerated treatment well General Behavior During Session: [facility] for tasks performed Cognition: [facility] for tasks performed PT Plan of Care PT Home Exercise Plan: see scanned report. Provided pt a green t-band for exercises.  PT Patient Instructions: importance of HEP.  discussed NS and Cx policy Consulted and Agree with Plan of Care: Patient   Lurena Nida, PTA/CLT 11/02/2011, 4:41 PM

## 2011-11-04 ENCOUNTER — Ambulatory Visit (HOSPITAL_COMMUNITY): Payer: Self-pay | Admitting: Physical Therapy

## 2011-11-04 ENCOUNTER — Encounter: Payer: Self-pay | Admitting: *Deleted

## 2011-11-05 ENCOUNTER — Ambulatory Visit
Admission: RE | Admit: 2011-11-05 | Discharge: 2011-11-05 | Disposition: A | Discharge status: Home / Self Care | Payer: Medicaid Other | Source: Ambulatory Visit | Attending: Physician Assistant | Admitting: Physician Assistant

## 2011-11-05 ENCOUNTER — Other Ambulatory Visit: Payer: Self-pay | Admitting: Physician Assistant

## 2011-11-05 DIAGNOSIS — M545 Low back pain, unspecified: Secondary | ICD-10-CM

## 2011-11-06 ENCOUNTER — Ambulatory Visit (HOSPITAL_COMMUNITY)
Admission: RE | Admit: 2011-11-06 | Discharge: 2011-11-06 | Disposition: A | Discharge status: Home / Self Care | Payer: Medicaid Other | Source: Ambulatory Visit | Attending: Orthopedic Surgery | Admitting: Orthopedic Surgery

## 2011-11-06 NOTE — Progress Notes (Signed)
Physical Therapy Treatment Patient Details  Name: [name] [name] MRN: [id number] Date of Birth: 02-26-1950  Today's Date: 11/06/2011 Time: 0900-0947 PT Time Calculation (min): 47 min  Visit#: 3  of 8   Re-eval: 11/27/11    Authorization: work comp   Subjective: Symptoms/Limitations Symptoms: Pt states that she feels better but she is still having the pins and needle feeling. Pain Assessment Currently in Pain?: Yes Pain Score:   2 Pain Location: Ankle Pain Orientation: Right Pain Type: Acute pain Pain Onset: More than a month ago   Exercise/Treatments     Ankle Stretches Gastroc Stretch: 3 reps;30 seconds   Ankle Exercises - Standing SLS: 5 x 15 sec max Rocker Board: 1 minute Heel Raises: 15 reps Toe Raise: 15 reps Ankle Exercises - Seated Towel Crunch: Limitations Towel Crunch Limitations: 2 minutes Towel Inversion/Eversion: 5 reps;Weights Towel Inversion/Eversion Weights (lbs): 2 Ankle Exercises - Supine T-Band: T-band 4 wayx 10  SLR 2#x10 Ankle Exercises - Sidelying  hip Abduction 2# x 10   PRone hip ext x10w/2#    Physical Therapy Assessment and Plan PT Assessment and Plan Clinical Impression Statement: Pt has visible sign of fatigue in ankle especially with eccentric exercises. WB ex increased pain but this decreased with NWB ex. PT Plan: Pt to begin Baps next treatment. Begin sitting and progress to standing.    Goals    Problem List Patient Active Problem List  Diagnosis  . TINEA PEDIS  . DIABETES MELLITUS, TYPE II  . HYPERLIPIDEMIA  . DEPRESSION  . HYPERTENSION  . COPD  . CONSTIPATION NOS  . OSTEOARTHRITIS  . LOW BACK PAIN, CHRONIC  . BURSITIS, HIPS, BILATERAL  . FIBROMYALGIA  . FATIGUE  . HEADACHE  . URINARY INCONTINENCE  . ABNORMAL ELECTROCARDIOGRAM  . Right ankle pain  . Difficulty in walking       GP    RUSSELL,CINDY 11/06/2011, 9:52 AM

## 2011-11-09 ENCOUNTER — Ambulatory Visit (HOSPITAL_COMMUNITY)
Admission: RE | Admit: 2011-11-09 | Discharge: 2011-11-09 | Disposition: A | Discharge status: Home / Self Care | Payer: Medicaid Other | Source: Ambulatory Visit | Attending: Orthopedic Surgery | Admitting: Orthopedic Surgery

## 2011-11-09 NOTE — Progress Notes (Signed)
Physical Therapy Treatment Patient Details  Name: GWENITH TSCHIDA MRN: [id number] Date of Birth: April 16, 1950  Today's Date: 11/09/2011 Time: 4098-1191 PT Time Calculation (min): 41 min Visit#: 4  of 8   Re-eval: 11/27/11 Authorization: work comp  Authorization Visit#: 4  of 8   Charges:  therex 32', manual 8'  Subjective: Symptoms/Limitations Symptoms: Pt. states she's hurting a little bit today, 3/10 in R ankle. Pain Assessment Currently in Pain?: Yes Pain Score:   3 Pain Location: Ankle Pain Orientation: Right   Exercise/Treatments Ankle Stretches Slant Board Stretch: 3 reps;30 seconds Ankle Exercises - Standing SLS: 22" max of 3 on Right Rocker Board:  (held due to shoes too slippery) Heel Raises: 15 reps Toe Raise: 15 reps Ankle Exercises - Seated Towel Crunch: Limitations Towel Crunch Limitations: 2 minutes Towel Inversion/Eversion: 5 reps Towel Inversion/Eversion Weights (lbs): 2 BAPS: Sitting;10 reps;Level 2;Limitations BAPS Limitations: 5 reps CW/CCW, all others 10 reps Ankle Exercises - Supine T-Band: T-band 4 wayx 10 green Other Supine Ankle Exercises: SLR 2# 2 sets x10 Ankle Exercises - Sidelying Other Sidelying Ankle Exercises: hip Abduction Right 2# 2sets 10reps Other Sidelying Ankle Exercises: PRone R hip ext 2" 2sets 10reps Manual Therapy Manual Therapy: Other (comment) Other Manual Therapy: Joint mobs to increase DF, PROM, scar massage  Physical Therapy Assessment and Plan PT Assessment and Plan Clinical Impression Statement: Added BAPS with most difficulty going into eversion/inversion, CW/CCW motions.  Pt. with improving strength with improved eccentric control with therex; increased to 2 sets of mat exercises without difficulty. PT Plan: Progress standing exercises; add step ups and balance activities.     Problem List Patient Active Problem List  Diagnosis  . TINEA PEDIS  . DIABETES MELLITUS, TYPE II  . HYPERLIPIDEMIA  . DEPRESSION  .  HYPERTENSION  . COPD  . CONSTIPATION NOS  . OSTEOARTHRITIS  . LOW BACK PAIN, CHRONIC  . BURSITIS, HIPS, BILATERAL  . FIBROMYALGIA  . FATIGUE  . HEADACHE  . URINARY INCONTINENCE  . ABNORMAL ELECTROCARDIOGRAM  . Right ankle pain  . Difficulty in walking    PT - End of Session Activity Tolerance: Patient tolerated treatment well General Behavior During Session: [facility] for tasks performed Cognition: [facility] for tasks performed   Lurena Nida, PTA/CLT 11/09/2011, 9:36 AM

## 2011-11-11 ENCOUNTER — Ambulatory Visit (HOSPITAL_COMMUNITY)
Admission: RE | Admit: 2011-11-11 | Discharge: 2011-11-11 | Disposition: A | Discharge status: Home / Self Care | Payer: Medicaid Other | Source: Ambulatory Visit | Attending: Orthopedic Surgery | Admitting: Orthopedic Surgery

## 2011-11-11 NOTE — Progress Notes (Signed)
Physical Therapy Treatment Patient Details  Name: [name] [name] MRN: [id number] Date of Birth: August 07, 1950  Today's Date: 11/11/2011 Time: 4098-1191 PT Time Calculation (min): 55 min Charges:  therex 42', ice 10' Visit#: 5  of 8   Re-eval: 11/27/11 Authorization: work comp  Authorization Visit#: 5  of 8    Subjective: Symptoms/Limitations Symptoms: Pt. states her pain is about the same, 3/10 in R ankle.  Reports compliance with HEP.   Exercise/Treatments Ankle Stretches Slant Board Stretch: 3 reps;30 seconds Ankle Exercises - Standing SLS: 12" max of 3 on Right Heel Raises: 15 reps Toe Raise: 15 reps Heel Walk (Round Trip): 1RT Toe Walk (Round Trip): 1RT Other Standing Ankle Exercises: lateral and forward step ups 4" 10 reps 1 HHA Other Standing Ankle Exercises: tandem gait 1RT Ankle Exercises - Seated Towel Crunch: Limitations Towel Crunch Limitations: 2 minutes Towel Inversion/Eversion: 5 reps Towel Inversion/Eversion Weights (lbs): 3 BAPS: Sitting;10 reps;Level 2;Limitations BAPS Limitations: 5 reps CW/CCW, all others 10 reps Ankle Exercises - Supine T-Band: T-band 4 wayx 15 green Other Supine Ankle Exercises: SLR 3# 2 sets x10 Ankle Exercises - Sidelying Other Sidelying Ankle Exercises: hip Abduction Right 2# 2sets 10reps Other Sidelying Ankle Exercises: PRone R hip ext 3# 2sets 10reps Modalities Modalities: Cryotherapy Cryotherapy Number Minutes Cryotherapy: 10 Minutes Cryotherapy Location: Ankle Type of Cryotherapy: Ice pack  Physical Therapy Assessment and Plan PT Assessment and Plan Clinical Impression Statement: Added heel and toe walking, tandem gait all with increased time and instability but no pain.  Able to increase to 3# weight today with mat activites and towel pulls and began step ups with 4" step.  Ended session with ice due to increased discomfort from new activities. PT Plan: Progress standing exercises; add vector stance and progress stability  exercises.     Problem List Patient Active Problem List  Diagnosis  . TINEA PEDIS  . DIABETES MELLITUS, TYPE II  . HYPERLIPIDEMIA  . DEPRESSION  . HYPERTENSION  . COPD  . CONSTIPATION NOS  . OSTEOARTHRITIS  . LOW BACK PAIN, CHRONIC  . BURSITIS, HIPS, BILATERAL  . FIBROMYALGIA  . FATIGUE  . HEADACHE  . URINARY INCONTINENCE  . ABNORMAL ELECTROCARDIOGRAM  . Right ankle pain  . Difficulty in walking    PT - End of Session Activity Tolerance: Patient tolerated treatment well General Behavior During Session: [facility] for tasks performed Cognition: [facility] for tasks performed    Lurena Nida, PTA/CLT 11/11/2011, 10:29 AM

## 2011-11-16 ENCOUNTER — Ambulatory Visit (HOSPITAL_COMMUNITY)
Admission: RE | Admit: 2011-11-16 | Discharge: 2011-11-16 | Disposition: A | Discharge status: Home / Self Care | Payer: Medicaid Other | Source: Ambulatory Visit | Attending: Orthopedic Surgery | Admitting: Orthopedic Surgery

## 2011-11-16 ENCOUNTER — Ambulatory Visit (HOSPITAL_COMMUNITY)
Admission: RE | Admit: 2011-11-16 | Discharge: 2011-11-16 | Disposition: A | Discharge status: Home / Self Care | Payer: Medicaid Other | Source: Ambulatory Visit | Attending: Oncology | Admitting: Oncology

## 2011-11-16 DIAGNOSIS — M25579 Pain in unspecified ankle and joints of unspecified foot: Secondary | ICD-10-CM | POA: Insufficient documentation

## 2011-11-16 DIAGNOSIS — J449 Chronic obstructive pulmonary disease, unspecified: Secondary | ICD-10-CM | POA: Insufficient documentation

## 2011-11-16 DIAGNOSIS — C649 Malignant neoplasm of unspecified kidney, except renal pelvis: Secondary | ICD-10-CM | POA: Insufficient documentation

## 2011-11-16 DIAGNOSIS — I10 Essential (primary) hypertension: Secondary | ICD-10-CM | POA: Insufficient documentation

## 2011-11-16 DIAGNOSIS — E119 Type 2 diabetes mellitus without complications: Secondary | ICD-10-CM | POA: Insufficient documentation

## 2011-11-16 DIAGNOSIS — J4489 Other specified chronic obstructive pulmonary disease: Secondary | ICD-10-CM | POA: Insufficient documentation

## 2011-11-16 DIAGNOSIS — R262 Difficulty in walking, not elsewhere classified: Secondary | ICD-10-CM | POA: Insufficient documentation

## 2011-11-16 DIAGNOSIS — M6281 Muscle weakness (generalized): Secondary | ICD-10-CM | POA: Insufficient documentation

## 2011-11-16 DIAGNOSIS — IMO0001 Reserved for inherently not codable concepts without codable children: Secondary | ICD-10-CM | POA: Insufficient documentation

## 2011-11-16 DIAGNOSIS — R918 Other nonspecific abnormal finding of lung field: Secondary | ICD-10-CM | POA: Insufficient documentation

## 2011-11-16 DIAGNOSIS — E049 Nontoxic goiter, unspecified: Secondary | ICD-10-CM | POA: Insufficient documentation

## 2011-11-16 NOTE — Progress Notes (Signed)
Physical Therapy Treatment Patient Details  Name: [name] [name] MRN: [id number] Date of Birth: 1950-05-31  Today's Date: 11/16/2011 Time: 0800-0850 PT Time Calculation (min): 50 min Visit#: 6  of 8   Re-eval: 11/27/11 Authorization: work comp  Authorization Visit#: 6  of 8   Charges:  therex 38', ice 10'  Subjective: Symptoms/Limitations Symptoms: Pt. states her pain is about 2/10 this morning.  States her weekend went well with minimal pain. Pain Assessment Currently in Pain?: Yes Pain Score:   2 Pain Location: Ankle Pain Orientation: Right   Exercise/Treatments Ankle Stretches Slant Board Stretch: 3 reps;30 seconds Ankle Exercises - Standing Vector Stance: Limitations Vector Stance Limitations: add next visit SLS: 17" max of 3 on Right Heel Raises: 20 reps Toe Raise: 20 reps Heel Walk (Round Trip): 1RT Toe Walk (Round Trip): 1RT Other Standing Ankle Exercises: lateral and forward step ups 4" 10 reps no HHA, fwd step downs 4" 10reps, 1 HHA Other Standing Ankle Exercises: tandem gait 1RT Ankle Exercises - Seated Towel Crunch: Limitations Towel Crunch Limitations: 2 minutes Towel Inversion/Eversion: 5 reps Towel Inversion/Eversion Weights (lbs): 3 BAPS: Standing;Level 2;10 reps;Limitations BAPS Limitations: 2 HHA Ankle Exercises - Supine T-Band: T-band 4 wayx 15 blue Other Supine Ankle Exercises: SLR 3# 2 sets x10 Ankle Exercises - Sidelying Other Sidelying Ankle Exercises: hip Abduction Right 3# 2sets 10reps Other Sidelying Ankle Exercises: PRone R hip ext 3# 2sets 10reps   Modalities Modalities: Cryotherapy Cryotherapy Number Minutes Cryotherapy: 10 Minutes Cryotherapy Location: Ankle Type of Cryotherapy: Ice pack  Physical Therapy Assessment and Plan PT Assessment and Plan Clinical Impression Statement: Able to increase reps/difficulty of exercises; progressed to standing BAPS with manual cues for stability.  Overall progressing/increasing strength with  decreasing pain. PT Plan: Progress standing exercises; add vector stance and progress stability exercises.  Re-eval X 2 more visits.     Problem List Patient Active Problem List  Diagnosis  . TINEA PEDIS  . DIABETES MELLITUS, TYPE II  . HYPERLIPIDEMIA  . DEPRESSION  . HYPERTENSION  . COPD  . CONSTIPATION NOS  . OSTEOARTHRITIS  . LOW BACK PAIN, CHRONIC  . BURSITIS, HIPS, BILATERAL  . FIBROMYALGIA  . FATIGUE  . HEADACHE  . URINARY INCONTINENCE  . ABNORMAL ELECTROCARDIOGRAM  . Right ankle pain  . Difficulty in walking    PT - End of Session Activity Tolerance: Patient tolerated treatment well General Behavior During Session: [facility] for tasks performed Cognition: [facility] for tasks performed    Lurena Nida, PTA/CLT 11/16/2011, 8:42 AM

## 2011-11-17 ENCOUNTER — Telehealth (HOSPITAL_COMMUNITY): Payer: Self-pay

## 2011-11-17 ENCOUNTER — Encounter (HOSPITAL_COMMUNITY): Payer: Self-pay

## 2011-11-17 NOTE — Telephone Encounter (Signed)
Message copied by Evelena Leyden on Tue Nov 17, 2011  5:40 PM ------      Message from: Mariel Sleet, ERIC S      Created: Mon Nov 16, 2011 12:20 PM       Call Samariya, pulm nodules without change and they recommend one more in 12 mo with which I agree      Schedule if ok with her

## 2011-11-17 NOTE — Telephone Encounter (Signed)
Patient notified of CT results and is willing to have repeat scan in 12 months. I can schedule once orders are placed.

## 2011-11-18 ENCOUNTER — Other Ambulatory Visit (HOSPITAL_COMMUNITY): Payer: Self-pay | Admitting: Oncology

## 2011-11-18 DIAGNOSIS — R918 Other nonspecific abnormal finding of lung field: Secondary | ICD-10-CM

## 2011-11-19 ENCOUNTER — Other Ambulatory Visit: Payer: Self-pay | Admitting: Orthopaedic Surgery

## 2011-11-19 DIAGNOSIS — M545 Low back pain: Secondary | ICD-10-CM

## 2011-11-20 ENCOUNTER — Ambulatory Visit (HOSPITAL_COMMUNITY)
Admission: RE | Admit: 2011-11-20 | Discharge: 2011-11-20 | Disposition: A | Discharge status: Home / Self Care | Payer: Medicaid Other | Source: Ambulatory Visit | Attending: Orthopedic Surgery | Admitting: Orthopedic Surgery

## 2011-11-20 NOTE — Progress Notes (Signed)
Physical Therapy Treatment Patient Details  Name: [name] [name] MRN: [id number] Date of Birth: 1950/10/05  Today's Date: 11/20/2011 Time: 7829-5621 PT Time Calculation (min): 48 min  Visit#: 7  of 8   Re-eval: 11/27/11  Charge: therex 38', ice 10'  Authorization: work comp  Authorization Time Period:    Authorization Visit#: 7  of 8    Subjective: Symptoms/Limitations Symptoms: Pt stated R ankle soreness this morning, pain scale 3/10. Pain Assessment Currently in Pain?: Yes Pain Score:   3 Pain Location: Ankle Pain Orientation: Right  Objective:   Exercise/Treatments Ankle Stretches Slant Board Stretch: 3 reps;30 seconds Ankle Exercises - Standing Vector Stance: 3 reps;5 seconds SLS: R 19", L 37" max of 3 Heel Walk (Round Trip): 1RT Toe Walk (Round Trip): 1RT Other Standing Ankle Exercises: lateral and forward step ups 4" 10 reps no HHA, fwd step downs 4" 10reps, 1 HHA Other Standing Ankle Exercises: tandem gait 1/2 RT; Tandem and retro tandem gait on balance beam x 1 RT Ankle Exercises - Seated Towel Crunch: Limitations Towel Crunch Limitations: 2 minutes Towel Inversion/Eversion: 5 reps Towel Inversion/Eversion Weights (lbs): 3 BAPS: Standing;Level 2;10 reps;Limitations BAPS Limitations: 2 HHA Ankle Exercises - Supine T-Band: TIME Ankle Exercises - Sidelying Other Sidelying Ankle Exercises: TIME resume hip abduction and prone extension 3#  next session    Physical Therapy Assessment and Plan PT Assessment and Plan Clinical Impression Statement: Added vector stance to improve hip stabilty and ankle strategy with min assistance for form.  Pt overall balance improving, able to progress to dynamic surfaces with min assistance for LOB episodes.  Pt still continues to have impaired coordination with ankle, noted with difficulty with BAPS board PT Plan: Re-eval next session.  Continue progress standing exercises.    Goals    Problem List Patient Active Problem  List  Diagnosis  . TINEA PEDIS  . DIABETES MELLITUS, TYPE II  . HYPERLIPIDEMIA  . DEPRESSION  . HYPERTENSION  . COPD  . CONSTIPATION NOS  . OSTEOARTHRITIS  . LOW BACK PAIN, CHRONIC  . BURSITIS, HIPS, BILATERAL  . FIBROMYALGIA  . FATIGUE  . HEADACHE  . URINARY INCONTINENCE  . ABNORMAL ELECTROCARDIOGRAM  . Right ankle pain  . Difficulty in walking    PT - End of Session Activity Tolerance: Patient tolerated treatment well General Behavior During Session: [facility] for tasks performed Cognition: [facility] for tasks performed  GP    Juel Burrow 11/20/2011, 9:28 AM

## 2011-11-22 ENCOUNTER — Other Ambulatory Visit: Payer: Medicaid Other

## 2011-11-23 ENCOUNTER — Ambulatory Visit
Admission: RE | Admit: 2011-11-23 | Discharge: 2011-11-23 | Disposition: A | Discharge status: Home / Self Care | Payer: Medicaid Other | Source: Ambulatory Visit | Attending: Orthopaedic Surgery | Admitting: Orthopaedic Surgery

## 2011-11-23 ENCOUNTER — Ambulatory Visit (HOSPITAL_COMMUNITY): Payer: Medicaid Other | Admitting: Physical Therapy

## 2011-11-23 DIAGNOSIS — M79605 Pain in left leg: Secondary | ICD-10-CM

## 2011-11-23 DIAGNOSIS — M545 Low back pain: Secondary | ICD-10-CM

## 2011-11-24 ENCOUNTER — Ambulatory Visit (HOSPITAL_COMMUNITY)
Admission: RE | Admit: 2011-11-24 | Discharge: 2011-11-24 | Disposition: A | Discharge status: Home / Self Care | Payer: Medicaid Other | Source: Ambulatory Visit | Attending: Orthopedic Surgery | Admitting: Orthopedic Surgery

## 2011-11-24 NOTE — Progress Notes (Signed)
Physical Therapy Re-evaluation / discharge  Patient Details  Name: [name] [name] MRN: [id number] Date of Birth: 09-22-1950  Today's Date: 11/24/2011 Time: 4098-1191 PT Time Calculation (min): 33 min  Visit#: 8  of 8   Re-eval: 11/27/11 Authorization: work comp  Authorization Visit#: 8  of 8   Charges:  MMT, ROM testing, PPT (LEFS)  Diagnosis: R ankle fracture, synovitis and R great toe fracture Surgical Date: 07/03/11 Next MD Visit: Dr. Eulah Pont - Novemeber 19th Prior Therapy: Adjustor: [facility]:  [phone] ext. [phone].  fax: [phone]  Subjective Symptoms/Limitations Symptoms: Pt. reports she sometimes has twinges of pain that only last a few seconds.  No lasting pain or other complaints.  Pt. returns to MD next week. Pain Assessment Currently in Pain?: No/denies  Objective: Sensation/Coordination/Flexibility/Functional Tests Functional Tests: LEFS: 40/80 (was 28/80)  RLE AROM (degrees) Right Ankle Dorsiflexion: 10  (was 2 degrees) Right Ankle Plantar Flexion: 40  (was 36 degrees) Right Ankle Inversion: 40  (was 18 degrees) Right Ankle Eversion: 40  (was 18 degrees)  RLE PROM (degrees) Right Ankle Dorsiflexion: 15  (was 8)  RLE Strength Right Hip Flexion: 5/5 (was 3+/5) Right Hip Extension: 4/5 (was 3+/5) Right Hip ABduction: 5/5 (was 3/5) Right Hip ADduction: 4/5 (was 3-/5) Right Knee Flexion: 5/5 (was 4/5) Right Knee Extension: 5/5 (was 4/5) Right Ankle Dorsiflexion: 5/5 (was 4/5) Right Ankle Plantar Flexion: 5/5 (was 2+/5) Right Ankle Inversion: 5/5 (was 3-/5) Right Ankle Eversion: 5/5 (was 3-/5)  Mobility/Balance  Single Leg Stance - Right Leg: 30 seconds (was 4 seconds)    Physical Therapy Assessment and Plan PT Assessment and Plan Clinical Impression Statement: Pt. with significant gains in strength and AROM in preparation for R ankle surgery.  Pt. has met all goals as set by evaluating therapist and is agreeable to discharge to HEP.   PT Plan:  Discharge to HEP.    Goals Home Exercise Program Pt will Perform Home Exercise Program: Independently PT Goal: Perform Home Exercise Program - Progress: Met  PT Short Term Goals Time to Complete Short Term Goals: 2 weeks PT Short Term Goal 1: Pt will improve LE and ankle strength by 1 muscle grade.  PT Short Term Goal 1 - Progress: Met PT Short Term Goal 2: Pt will improve R ankle AROM by 5 degrees.  PT Short Term Goal 2 - Progress: Met  PT Long Term Goals Time to Complete Long Term Goals: 4 weeks PT Long Term Goal 1: Pt will improve her LE strength to [facility] in order to amblate independently on indoor and outdoor surfaces and ascend and descend 5 stairs w/1 handrail in order to attend a movie with her husband.  PT Long Term Goal 1 - Progress: Met PT Long Term Goal 2: Pt will improve dynamic balance in order to ambulate independently on outdoor surfaces.  PT Long Term Goal 2 - Progress: Met  Problem List Patient Active Problem List  Diagnosis  . TINEA PEDIS  . DIABETES MELLITUS, TYPE II  . HYPERLIPIDEMIA  . DEPRESSION  . HYPERTENSION  . COPD  . CONSTIPATION NOS  . OSTEOARTHRITIS  . LOW BACK PAIN, CHRONIC  . BURSITIS, HIPS, BILATERAL  . FIBROMYALGIA  . FATIGUE  . HEADACHE  . URINARY INCONTINENCE  . ABNORMAL ELECTROCARDIOGRAM  . Right ankle pain  . Difficulty in walking    PT - End of Session Activity Tolerance: Patient tolerated treatment well General Behavior During Session: [facility] for tasks performed Cognition: [facility] for tasks performed  Lurena Nida, PTA/CLT 11/24/2011, 3:25 PM

## 2011-11-27 ENCOUNTER — Ambulatory Visit (HOSPITAL_COMMUNITY): Payer: Medicaid Other | Admitting: Physical Therapy

## 2011-12-09 ENCOUNTER — Ambulatory Visit (HOSPITAL_COMMUNITY)
Admission: RE | Admit: 2011-12-09 | Discharge: 2011-12-09 | Disposition: A | Discharge status: Home / Self Care | Payer: Medicaid Other | Source: Ambulatory Visit | Attending: Orthopaedic Surgery | Admitting: Orthopaedic Surgery

## 2011-12-09 DIAGNOSIS — E119 Type 2 diabetes mellitus without complications: Secondary | ICD-10-CM | POA: Insufficient documentation

## 2011-12-09 DIAGNOSIS — I10 Essential (primary) hypertension: Secondary | ICD-10-CM | POA: Insufficient documentation

## 2011-12-09 DIAGNOSIS — M25579 Pain in unspecified ankle and joints of unspecified foot: Secondary | ICD-10-CM | POA: Insufficient documentation

## 2011-12-09 DIAGNOSIS — M546 Pain in thoracic spine: Secondary | ICD-10-CM | POA: Insufficient documentation

## 2011-12-09 DIAGNOSIS — M6281 Muscle weakness (generalized): Secondary | ICD-10-CM | POA: Insufficient documentation

## 2011-12-09 DIAGNOSIS — M25673 Stiffness of unspecified ankle, not elsewhere classified: Secondary | ICD-10-CM | POA: Insufficient documentation

## 2011-12-09 DIAGNOSIS — R262 Difficulty in walking, not elsewhere classified: Secondary | ICD-10-CM | POA: Insufficient documentation

## 2011-12-09 DIAGNOSIS — M5136 Other intervertebral disc degeneration, lumbar region: Secondary | ICD-10-CM | POA: Insufficient documentation

## 2011-12-09 DIAGNOSIS — M25676 Stiffness of unspecified foot, not elsewhere classified: Secondary | ICD-10-CM | POA: Insufficient documentation

## 2011-12-09 DIAGNOSIS — IMO0001 Reserved for inherently not codable concepts without codable children: Secondary | ICD-10-CM | POA: Insufficient documentation
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
## 2011-12-16 ENCOUNTER — Ambulatory Visit (HOSPITAL_COMMUNITY)
Admission: RE | Admit: 2011-12-16 | Discharge: 2011-12-16 | Disposition: A | Discharge status: Home / Self Care | Payer: Medicaid Other | Source: Ambulatory Visit | Attending: Orthopedic Surgery | Admitting: Orthopedic Surgery

## 2011-12-16 DIAGNOSIS — E119 Type 2 diabetes mellitus without complications: Secondary | ICD-10-CM | POA: Insufficient documentation

## 2011-12-16 DIAGNOSIS — M6281 Muscle weakness (generalized): Secondary | ICD-10-CM | POA: Insufficient documentation

## 2011-12-16 DIAGNOSIS — M25579 Pain in unspecified ankle and joints of unspecified foot: Secondary | ICD-10-CM | POA: Insufficient documentation

## 2011-12-16 DIAGNOSIS — R262 Difficulty in walking, not elsewhere classified: Secondary | ICD-10-CM | POA: Insufficient documentation

## 2011-12-16 DIAGNOSIS — I10 Essential (primary) hypertension: Secondary | ICD-10-CM | POA: Insufficient documentation

## 2011-12-16 DIAGNOSIS — M25673 Stiffness of unspecified ankle, not elsewhere classified: Secondary | ICD-10-CM | POA: Insufficient documentation

## 2011-12-16 DIAGNOSIS — M25676 Stiffness of unspecified foot, not elsewhere classified: Secondary | ICD-10-CM | POA: Insufficient documentation

## 2011-12-16 DIAGNOSIS — IMO0001 Reserved for inherently not codable concepts without codable children: Secondary | ICD-10-CM | POA: Insufficient documentation

## 2011-12-16 NOTE — Progress Notes (Signed)
Physical Therapy Treatment Patient Details  Name: MCKINZEE SPIRITO MRN: [id number] Date of Birth: 14-Aug-1950  Today's Date: 12/16/2011 Time: 1022-1105 PT Time Calculation (min): 43 min  Visit#: 2  of 4   Re-eval: 01/08/12  Charge: Therex 35', manual 8'  Authorization: Medicaid   Authorization Time Period:    Authorization Visit#: 2  of 4    Subjective: Symptoms/Limitations Symptoms: Mid to lower back pain scale 2/10 today.   How long can you walk comfortably?: Able to walk through [employer] for 15 minutes Pain Assessment Currently in Pain?: Yes Pain Score:   2 Pain Location: Back Pain Orientation: Lower  Objective:   Exercise/Treatments Stretches Active Hamstring Stretch: 3 reps;30 seconds Quadruped Mid Back Stretch: 5 reps;Limitations Quadruped Mid Back Stretch Limitations: Angry Cat stretch 5 resps 2-3 sec hold Prone Mid Back Stretch: 5 reps;20 seconds Supine Bridge: 15 reps;Limitations Bridge Limitations: bridge on green ball Straight Leg Raise: 15 reps Sidelying Hip Abduction: 15 reps Other Sidelying Lumbar Exercises: hip adduction 15x Prone  Other Prone Lumbar Exercises: NMR for multifidus and PF activitaion 10x5 sec holds after  Manual Therapy Manual Therapy: Joint mobilization Joint Mobilization: Grade I-IV PA to T4-7 SP and R TP to decrease pain and improve mobility (completed by PT (LM))  Physical Therapy Assessment and Plan PT Assessment and Plan Clinical Impression Statement: Began POC per PT plan for core and LE strengthening and to increase flexibility.  Instructed foam roll for thoracic extension self mobs, pt able to demonstrate appropriate technique following cues.  Annett Fabian, PT completed spinal mobs to improve mobility and reduce pain. PT Plan: Continue with curernt POC for core and LE strengthening.  Begin seated posture education and exercises next session.  Continue with manual techniques to rib and thoracic spine as needed     Goals    Problem  List Patient Active Problem List  Diagnosis  . TINEA PEDIS  . DIABETES MELLITUS, TYPE II  . HYPERLIPIDEMIA  . DEPRESSION  . HYPERTENSION  . COPD  . CONSTIPATION NOS  . OSTEOARTHRITIS  . LOW BACK PAIN, CHRONIC  . BURSITIS, HIPS, BILATERAL  . FIBROMYALGIA  . FATIGUE  . HEADACHE  . URINARY INCONTINENCE  . ABNORMAL ELECTROCARDIOGRAM  . Right ankle pain  . Difficulty in walking  . Pain in thoracic spine  . DDD (degenerative disc disease), lumbar    PT - End of Session Activity Tolerance: Patient tolerated treatment well General Behavior During Session: [facility] for tasks performed Cognition: [facility] for tasks performed  GP    Juel Burrow 12/16/2011, 12:47 PM

## 2011-12-23 ENCOUNTER — Ambulatory Visit (HOSPITAL_COMMUNITY)
Admission: RE | Admit: 2011-12-23 | Discharge: 2011-12-23 | Disposition: A | Discharge status: Home / Self Care | Payer: Medicaid Other | Source: Ambulatory Visit | Attending: Orthopaedic Surgery | Admitting: Orthopaedic Surgery

## 2011-12-23 NOTE — Progress Notes (Signed)
Physical Therapy Treatment Patient Details  Name: ETHAN KASPERSKI MRN: [id number] Date of Birth: [birth year]  Today's Date: 12/23/2011 Time: 1016-1058 PT Time Calculation (min): 42 min Charges: 10' Manual, 26' TE Visit#: 3  of 4   Re-eval: 01/08/12    Authorization: Medicaid   Authorization Time Period:    Authorization Visit#: 3  of 4    Subjective: Symptoms/Limitations Symptoms: I've been working a lot on my posture with my grandchildren and making [place] walk around with books on our heads. Pain Assessment Currently in Pain?: Yes Pain Score:   3 Pain Location: Back  Precautions/Restrictions     Exercise/Treatments Mobility/Balance        Supine External Rotation: Both;10 reps;Limitations External Rotation Limitations: laying on blanket along spine ABduction: 10 reps;Limitations ABduction Limitations: laying on blanket along spine Other Supine Exercises: Foam roller for t-spine x5 Other Supine Exercises: chest stretch on blanket vertically along spine with shoulder abduction to 90 degrees x3 minutes; laying on blanket along spine: W-backs x10, X-V x10, Star gazers x10 Prone  Flexion: Both;10 reps Extension: Both;10 reps (palms down) Horizontal ABduction 1: Both;10 reps Other Prone Exercises: W-backs x19 Other Prone Exercises: POE: SA w/TC x10 reps; cervical rotation, shoulder depression w/TC x15 res; rhomboid w/TC x15 reps ROM / Strengthening / Isometric Strengthening UBE (Upper Arm Bike): 6' backwards at end of session Pushups: 10 reps;Limitations Pushups Limitations: wall Prot/Ret//Elev/Dep: 10  Manual Therapy Manual Therapy: Joint mobilization Joint Mobilization: Grade I-IV PA to T4-7 SP and R TP to decrease pain and improve mobility  Physical Therapy Assessment and Plan PT Assessment and Plan Clinical Impression Statement: Focused on improving scapular stabilization and strength and updated HEP with prone activities for UE to improve posture and strength.  PT  Plan: 1 more visit and then D/C per Medicaid.  Review lumbar and shoulder HEP to ensure complaince and understanding.  Review goals.     Goals    Problem List Patient Active Problem List  Diagnosis  . TINEA PEDIS  . DIABETES MELLITUS, TYPE II  . HYPERLIPIDEMIA  . DEPRESSION  . HYPERTENSION  . COPD  . CONSTIPATION NOS  . OSTEOARTHRITIS  . LOW BACK PAIN, CHRONIC  . BURSITIS, HIPS, BILATERAL  . FIBROMYALGIA  . FATIGUE  . HEADACHE  . URINARY INCONTINENCE  . ABNORMAL ELECTROCARDIOGRAM  . Right ankle pain  . Difficulty in walking  . Pain in thoracic spine  . DDD (degenerative disc disease), lumbar    PT - End of Session Activity Tolerance: Patient tolerated treatment well General Behavior During Session: [facility] for tasks performed Cognition: [facility] for tasks performed PT Plan of Care PT Home Exercise Plan: see scanned report Consulted and Agree with Plan of Care: Patient  GP    Babbette Dalesandro 12/23/2011, 11:03 AM

## 2011-12-30 ENCOUNTER — Ambulatory Visit (HOSPITAL_COMMUNITY)
Admission: RE | Admit: 2011-12-30 | Discharge: 2011-12-30 | Disposition: A | Discharge status: Home / Self Care | Payer: Medicaid Other | Source: Ambulatory Visit | Attending: Orthopaedic Surgery | Admitting: Orthopaedic Surgery

## 2011-12-30 NOTE — Progress Notes (Signed)
Physical Therapy Discharge Summary and Treatment Patient Details  Name: [name] [name] MRN: [id number] Date of Birth: 07-15-1950  Today's Date: 12/30/2011 Time: 1020-1054 PT Time Calculation (min): 34 min Charges; 10' Manual 24' TE Visit#: 4  of 4   Re-eval: 01/08/12   Authorization: Medicaid   Authorization Time Period:    Authorization Visit#: 4  of 4    Subjective: Symptoms/Limitations Symptoms: Pt reports that she is getting a cold.  She is working on her exercises as much as she thinks about it.  Pain Assessment Currently in Pain?: Yes Pain Score:   3 Pain Location: Back  Precautions/Restrictions     Exercise/Treatments Stretches Quadruped Mid Back Stretch: Limitations Quadruped Mid Back Stretch Limitations: Angry Cat stretch 10 resps 2-3 sec hold Standing Scapular Retraction: Both;10 reps;Theraband Theraband Level (Scapular Retraction): Level 4 (Blue) Row: Both;15 reps;Theraband Theraband Level (Row): Level 4 (Blue) Shoulder Extension: Both;15 reps;Theraband Theraband Level (Shoulder Extension): Level 4 (Blue) Shoulder ADduction: Both;15 reps;Theraband Theraband Level (Shoulder Adduction): Level 4 (Blue) Seated Other Seated Lumbar Exercises: heel roll outs 10x10 sec hold Other Seated Lumbar Exercises: thoracic back stretch with rotation 2x30 sec holds each direction Sidelying Other Sidelying Lumbar Exercises: thoracic crossover stretch to the L x2 minutes  Supine External Rotation: Both;15 reps External Rotation Limitations: laying on blanket along spine ABduction: 15 reps ABduction Limitations: laying on blanket along spine Prone  Other Prone Exercises: POE: SA w/TC x10 reps; cervical rotation, shoulder depression w/TC x15 res; rhomboid w/TC x15 reps  Manual Therapy Joint Mobilization: Prone and R S/L: Grade I-IV PA to T4-7 SP and L TP to decrease pain and improve mobility x10 min  Physical Therapy Assessment and Plan PT Assessment and Plan Clinical  Impression Statement: Ms. Behrens attended her allowed 4 visits of OP PT for thoracic pain with the following findings: met 3/5 goals, she has overall decreased pain, improved core activation, impaired gait mechanics, will be recieving foot surgery on January 6th.  At this time she will be d/c from PT w/HEP to continue with postural and core strengthening at home.  PT Plan: D/C    Goals Home Exercise Program Pt will Perform Home Exercise Program: Independently PT Goal: Perform Home Exercise Program - Progress: Progressing toward goal PT Short Term Goals Time to Complete Short Term Goals: 3 weeks PT Short Term Goal 1: Pt will imporve LE strength by 1 muscle grade in order to stand and walk longer than 30 minutes for improved QOL.  PT Short Term Goal 1 - Progress: Progressing toward goal (15-20 minutes) PT Short Term Goal 2: Pt will demonstrate core activation with minimal compensations.  PT Short Term Goal 2 - Progress: Met PT Short Term Goal 3: Pt will improve core coordination and strength in order to sit with appropriate posture.  PT Short Term Goal 3 - Progress: Met PT Short Term Goal 4: Pt will improve thoracic SP and rib mobility to decrease spams to thoracic erector spinae. PT Short Term Goal 4 - Progress: Progressing toward goal PT Short Term Goal 5: Pt will decrease her ODI to less than 52% for improved QOL.  PT Short Term Goal 5 - Progress: Met (46%)  Problem List Patient Active Problem List  Diagnosis  . TINEA PEDIS  . DIABETES MELLITUS, TYPE II  . HYPERLIPIDEMIA  . DEPRESSION  . HYPERTENSION  . COPD  . CONSTIPATION NOS  . OSTEOARTHRITIS  . LOW BACK PAIN, CHRONIC  . BURSITIS, HIPS, BILATERAL  . FIBROMYALGIA  . FATIGUE  .  HEADACHE  . URINARY INCONTINENCE  . ABNORMAL ELECTROCARDIOGRAM  . Right ankle pain  . Difficulty in walking  . Pain in thoracic spine  . DDD (degenerative disc disease), lumbar    PT - End of Session Activity Tolerance: Patient tolerated treatment  well General Behavior During Session: [facility] for tasks performed Cognition: [facility] for tasks performed PT Plan of Care PT Home Exercise Plan: see scanned report Consulted and Agree with Plan of Care: Patient  GP    Shannette Tabares, PT 12/30/2011, 11:04 AM

## 2012-01-19 ENCOUNTER — Encounter (INDEPENDENT_AMBULATORY_CARE_PROVIDER_SITE_OTHER): Payer: Self-pay | Admitting: *Deleted

## 2012-02-23 ENCOUNTER — Other Ambulatory Visit (HOSPITAL_COMMUNITY): Payer: Self-pay | Admitting: Urology

## 2012-02-27 ENCOUNTER — Other Ambulatory Visit: Payer: Self-pay

## 2012-03-28 ENCOUNTER — Encounter: Payer: Self-pay | Admitting: Oncology

## 2012-03-29 ENCOUNTER — Telehealth: Payer: Self-pay | Admitting: Physician Assistant

## 2012-03-29 DIAGNOSIS — N2889 Other specified disorders of kidney and ureter: Secondary | ICD-10-CM

## 2012-03-29 MED ORDER — METFORMIN HCL ER (OSM) 1000 MG PO TB24: 1000.0000 mg | ORAL_TABLET | Freq: Two times a day (BID) | ORAL | Status: DC

## 2012-03-29 NOTE — Telephone Encounter (Signed)
Medication refilled per protocol. 

## 2012-04-04 ENCOUNTER — Telehealth: Payer: Self-pay | Admitting: Physician Assistant

## 2012-04-04 ENCOUNTER — Encounter (HOSPITAL_COMMUNITY): Payer: Self-pay | Admitting: Oncology

## 2012-04-04 ENCOUNTER — Encounter (HOSPITAL_COMMUNITY): Payer: PPO | Attending: Oncology | Admitting: Oncology

## 2012-04-04 ENCOUNTER — Other Ambulatory Visit: Payer: Self-pay | Admitting: Physician Assistant

## 2012-04-04 VITALS — BP 132/77 | HR 87 | Temp 98.2°F | Resp 16 | Wt 198.9 lb

## 2012-04-04 DIAGNOSIS — C641 Malignant neoplasm of right kidney, except renal pelvis: Secondary | ICD-10-CM

## 2012-04-04 DIAGNOSIS — C649 Malignant neoplasm of unspecified kidney, except renal pelvis: Secondary | ICD-10-CM

## 2012-04-04 DIAGNOSIS — R911 Solitary pulmonary nodule: Secondary | ICD-10-CM

## 2012-04-04 DIAGNOSIS — N2889 Other specified disorders of kidney and ureter: Secondary | ICD-10-CM

## 2012-04-04 DIAGNOSIS — E785 Hyperlipidemia, unspecified: Secondary | ICD-10-CM

## 2012-04-04 DIAGNOSIS — R918 Other nonspecific abnormal finding of lung field: Secondary | ICD-10-CM

## 2012-04-04 DIAGNOSIS — R110 Nausea: Secondary | ICD-10-CM

## 2012-04-04 NOTE — Telephone Encounter (Signed)
Medication was refilled on 03/29/12  Not refilled again today.  Pt NTBS

## 2012-04-04 NOTE — Telephone Encounter (Signed)
Medication refilled per protocol. 

## 2012-04-04 NOTE — Progress Notes (Signed)
Problem #1 renal cell carcinoma, grade 4, 5.2 cm in size status post partial nephrectomy by robotic surgery on the right. She clear margins, no vascular invasion of the renal vessels, but she did have 2 possibly 3 small 20 nodules they're essentially stable as her last CAT scan in February but she had 2 new nodules found 6 mm and 4 mm. We need to do a followup CAT scan this may rather than waiting until August or September my opinion.  She does not feel great sometimes. She is nauseated intermittently and sometimes she gets jaw pain or the sides of her face bilaterally get achy and at the base of her neck gets achy, she breaks out in sweat, doesn't feel good and it is accompanied by nausea. She has had headaches for years and these are not like her old headaches. This is more jaw pain sides of her face pain.  Her blood pressure standing is 152/72 right arm. She is not sweaty or in any acute distress. Pulses 90 standing. Her vital signs are portal change standing from sitting. She has no lymphadenopathy in the cervical, supraclavicular, infraclavicular, axillary, epitrochlear or inguinal areas. Abdomen remains obese but soft and nontender without organomegaly. Bowel sounds are normal. Lungs are clear to auscultation and percussion. Heart shows a regular rhythm and rate without murmur rub or gallop. She has no arm edema no leg edema.  I think she needs [facility] cardiac consultation to make sure this is not heart related disease in a 44 woman. The second thing that she needs is a CAT scan of her chest without contrast in late may rather than waiting until August. I would rather do that then wait 6 months. I will see her the day after her CAT scan and we will see what the cardiologist is about her symptomatology which is a little unusual area if they come up with nothing that we may have to consider scan of her brain. Neurologically though she looks intact facial symmetry intact normal speech, normal gait,  normal thought processes etc.

## 2012-04-04 NOTE — Patient Instructions (Addendum)
[facility] Discharge Instructions  RECOMMENDATIONS MADE BY THE CONSULTANT AND ANY TEST RESULTS WILL BE SENT TO YOUR REFERRING PHYSICIAN.  EXAM FINDINGS BY THE PHYSICIAN TODAY AND SIGNS OR SYMPTOMS TO REPORT TO CLINIC OR PRIMARY PHYSICIAN: Exam and discussion by MD.  Will repeat CT of your Chest in late May.  Will also get cardiac consult to evaluate the nausea, intermittent headaches with neck and jaw discomfort.  MD Concerned could be heart related.  MEDICATIONS PRESCRIBED:  none  INSTRUCTIONS GIVEN AND DISCUSSED: Report increased shortness of breath, increased fatigue, etc.  SPECIAL INSTRUCTIONS/FOLLOW-UP: Cardiac Consult, Labs and CT of your chest and to see MD day after scans. Thank you for choosing [facility] to provide your oncology and hematology care.  To afford each patient quality time with our providers, please arrive at least 15 minutes before your scheduled appointment time.  With your help, our goal is to use those 15 minutes to complete the necessary work-up to ensure our physicians have the information they need to help with your evaluation and healthcare recommendations.    Effective January 1st, 2014, we ask that you re-schedule your appointment with our physicians should you arrive 10 or more minutes late for your appointment.  We strive to give you quality time with our providers, and arriving late affects you and other patients whose appointments are after yours.    Again, thank you for choosing [facility].  Our hope is that these requests will decrease the amount of time that you wait before being seen by our physicians.       _____________________________________________________________  Should you have questions after your visit to [facility], please contact our office at [phone] between the hours of 8:30 a.m. and 5:00 p.m.  Voicemails left after 4:30 p.m. will not be returned until the following  business day.  For prescription refill requests, have your pharmacy contact our office with your prescription refill request.

## 2012-04-08 ENCOUNTER — Ambulatory Visit (INDEPENDENT_AMBULATORY_CARE_PROVIDER_SITE_OTHER): Payer: BC Managed Care – PPO | Admitting: Internal Medicine

## 2012-04-08 ENCOUNTER — Encounter: Payer: Self-pay | Admitting: Internal Medicine

## 2012-04-08 ENCOUNTER — Encounter: Payer: Self-pay | Admitting: *Deleted

## 2012-04-08 VITALS — BP 115/70 | HR 82 | Ht 66.0 in | Wt 199.8 lb

## 2012-04-08 DIAGNOSIS — R0602 Shortness of breath: Secondary | ICD-10-CM
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
## 2012-04-08 NOTE — Patient Instructions (Addendum)
Your physician recommends that you schedule a follow-up appointment in: After Cardiac Cath  Your physician recommends that you return for lab work in: Today

## 2012-04-09 LAB — PROTIME-INR
INR: 0.96 (ref <1.50)
Prothrombin Time: 12.8 seconds (ref 11.6–15.2)

## 2012-04-09 LAB — TSH: TSH: 1.078 u[IU]/mL (ref 0.350–4.500)

## 2012-04-13 ENCOUNTER — Other Ambulatory Visit: Payer: Self-pay | Admitting: *Deleted

## 2012-04-13 DIAGNOSIS — Z01812 Encounter for preprocedural laboratory examination: Secondary | ICD-10-CM

## 2012-04-13 LAB — BASIC METABOLIC PANEL
BUN: 25 mg/dL — ABNORMAL HIGH (ref 6–23)
Calcium: 9.7 mg/dL (ref 8.4–10.5)
Chloride: 104 mEq/L (ref 96–112)
Creat: 1.4 mg/dL — ABNORMAL HIGH (ref 0.50–1.10)

## 2012-04-13 LAB — CBC
HCT: 34.7 % — ABNORMAL LOW (ref 36.0–46.0)
MCHC: 33.4 g/dL (ref 30.0–36.0)
MCV: 86.5 fL (ref 78.0–100.0)
Platelets: 239 10*3/uL (ref 150–400)
RDW: 14.4 % (ref 11.5–15.5)
WBC: 12.3 10*3/uL — ABNORMAL HIGH (ref 4.0–10.5)

## 2012-04-13 NOTE — Progress Notes (Signed)
Received a call from the cath lab stating that BMET and CBC had not been completed.  Orders were placed and released for CBC,BMET,PT,PTT,TSH at 14:33 on 3/28 for these labs, however Aram Beecham at [place] states that the CBC and BMET were not visible on her side.  Orders re-entered and released.  Aram Beecham to call patient back for labs today.  Kim in [place] lab notified of issue.

## 2012-04-14 ENCOUNTER — Inpatient Hospital Stay (HOSPITAL_BASED_OUTPATIENT_CLINIC_OR_DEPARTMENT_OTHER)
Admission: RE | Admit: 2012-04-14 | Discharge: 2012-04-14 | Disposition: A | Discharge status: Home / Self Care | Payer: BC Managed Care – PPO | Source: Ambulatory Visit | Attending: Cardiovascular Disease | Admitting: Cardiovascular Disease

## 2012-04-14 ENCOUNTER — Encounter (HOSPITAL_BASED_OUTPATIENT_CLINIC_OR_DEPARTMENT_OTHER)
Admission: RE | Disposition: A | Discharge status: Home / Self Care | Payer: Self-pay | Source: Ambulatory Visit | Attending: Cardiovascular Disease

## 2012-04-14 DIAGNOSIS — K7689 Other specified diseases of liver: Secondary | ICD-10-CM | POA: Insufficient documentation

## 2012-04-14 DIAGNOSIS — E7800 Pure hypercholesterolemia, unspecified: Secondary | ICD-10-CM | POA: Insufficient documentation

## 2012-04-14 DIAGNOSIS — E119 Type 2 diabetes mellitus without complications: Secondary | ICD-10-CM | POA: Insufficient documentation

## 2012-04-14 DIAGNOSIS — R0989 Other specified symptoms and signs involving the circulatory and respiratory systems: Secondary | ICD-10-CM | POA: Insufficient documentation

## 2012-04-14 DIAGNOSIS — R0609 Other forms of dyspnea: Secondary | ICD-10-CM | POA: Insufficient documentation

## 2012-04-14 DIAGNOSIS — I10 Essential (primary) hypertension: Secondary | ICD-10-CM | POA: Insufficient documentation

## 2012-04-14 DIAGNOSIS — R0602 Shortness of breath: Secondary | ICD-10-CM

## 2012-04-14 LAB — POCT I-STAT 3, ART BLOOD GAS (G3+)
O2 Saturation: 94 %
Patient temperature: 30
TCO2: 22 mmol/L (ref 0–100)
pH, Arterial: 7.509 — ABNORMAL HIGH (ref 7.350–7.450)

## 2012-04-14 LAB — POCT I-STAT 3, VENOUS BLOOD GAS (G3P V): Acid-base deficit: 4 mmol/L — ABNORMAL HIGH (ref 0.0–2.0)

## 2012-04-14 LAB — POCT I-STAT GLUCOSE
Glucose, Bld: 95 mg/dL (ref 70–99)
Operator id: 135711

## 2012-04-14 SURGERY — JV LEFT AND RIGHT HEART CATHETERIZATION WITH CORONARY ANGIOGRAM
Anesthesia: Moderate Sedation

## 2012-04-14 MED ORDER — SODIUM CHLORIDE 0.9 % IJ SOLN: 3.0000 mL | INTRAMUSCULAR | Status: DC | PRN

## 2012-04-14 MED ORDER — SODIUM CHLORIDE 0.9 % IV SOLN: 250.0000 mL | INTRAVENOUS | Status: DC | PRN

## 2012-04-14 MED ORDER — DIAZEPAM 2 MG PO TABS: 2.0000 mg | ORAL_TABLET | ORAL | Status: DC | PRN

## 2012-04-14 MED ORDER — ASPIRIN 81 MG PO CHEW
324.0000 mg | CHEWABLE_TABLET | ORAL | Status: AC
  Administered 2012-04-14: 324 mg via ORAL

## 2012-04-14 MED ORDER — ACETAMINOPHEN 325 MG PO TABS: 650.0000 mg | ORAL_TABLET | ORAL | Status: DC | PRN

## 2012-04-14 MED ORDER — OXYCODONE-ACETAMINOPHEN 5-325 MG PO TABS: 1.0000 | ORAL_TABLET | ORAL | Status: DC | PRN

## 2012-04-14 MED ORDER — SODIUM CHLORIDE 0.45 % IV SOLN: INTRAVENOUS | Status: AC

## 2012-04-14 MED ORDER — SODIUM CHLORIDE 0.9 % IJ SOLN: 3.0000 mL | Freq: Two times a day (BID) | INTRAMUSCULAR | Status: DC

## 2012-04-14 MED ORDER — SODIUM CHLORIDE 0.9 % IV SOLN: INTRAVENOUS | Status: DC

## 2012-04-14 MED ORDER — ONDANSETRON HCL 4 MG/2ML IJ SOLN: 4.0000 mg | Freq: Four times a day (QID) | INTRAMUSCULAR | Status: DC | PRN

## 2012-04-14 MED ORDER — ASPIRIN EC 325 MG PO TBEC: 325.0000 mg | DELAYED_RELEASE_TABLET | Freq: Every day | ORAL | Status: DC
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
## 2012-04-14 NOTE — CV Procedure (Signed)
   Cardiac Catheterization Procedure Note  Name: [name] [name] MRN: [id number] DOB: 07-08-1950  Procedure: Right Heart Cath, Left Heart Cath, Selective Coronary Angiography, LV angiography  Indication:    Procedural Details: The right groin was prepped, draped, and anesthetized with 1% lidocaine. Using the modified Seldinger technique a 5 French sheath was placed in the right femoral artery and a 7 French sheath was placed in the right femoral vein. A Swan-Ganz catheter was used for the right heart catheterization. Standard protocol was followed for recording of right heart pressures and sampling of oxygen saturations. Fick cardiac output was calculated. Standard Judkins catheters were used for selective coronary angiography and left ventriculography. There were no immediate procedural complications. The patient was transferred to the post catheterization recovery area for further monitoring.  Procedural Findings: Hemodynamics RA 9 RV 26 5 12  PA  21 6 14  PCWP  11 LV  136 15 AO 135 61  Cardiac Output (Fick)  5.6   Coronary angiography: Coronary dominance: right  Left mainstem: normal  Left anterior descending (LAD): normal  D1- normal D2- normal D3- normal  Left circumflex (LCx): normal  OM1-normal OM2- normal  Right coronary artery (RCA): normal  PDA/PLB normnal  Left ventriculography: Left ventricular systolic function is normal, LVEF is estimated at 55-65%, there is no significant mitral regurgitation   Final Conclusions:   Normal cors, Normal LV and normal heart pressures  Symptoms would appear to be noncardiac in nature  Recommendations:  F/u PCP   Charlton Haws 04/14/2012, 11:02 AM

## 2012-04-14 NOTE — Interval H&P Note (Signed)
History and Physical Interval Note:  04/14/2012 10:31 AM  [name] [name]  has presented today for surgery, with the diagnosis of myopathy  The various methods of treatment have been discussed with the patient and family. After consideration of risks, benefits and other options for treatment, the patient has consented to  Procedure(s): JV LEFT AND RIGHT HEART CATHETERIZATION WITH CORONARY ANGIOGRAM (N/A) as a surgical intervention .  The patient's history has been reviewed, patient examined, no change in status, stable for surgery.  I have reviewed the patient's chart and labs.  Questions were answered to the patient's satisfaction.     Charlton Haws

## 2012-04-14 NOTE — OR Nursing (Signed)
Tegaderm dressing applied, site level 0, bedrest begins at 1120 

## 2012-04-29 ENCOUNTER — Encounter: Payer: Self-pay | Admitting: Adult Health

## 2012-04-29 ENCOUNTER — Ambulatory Visit (INDEPENDENT_AMBULATORY_CARE_PROVIDER_SITE_OTHER): Payer: BC Managed Care – PPO | Admitting: Adult Health

## 2012-04-29 ENCOUNTER — Encounter: Payer: BC Managed Care – PPO | Admitting: Physician Assistant

## 2012-04-29 VITALS — BP 118/68 | HR 73 | Ht 66.0 in | Wt 199.0 lb

## 2012-04-29 DIAGNOSIS — I10 Essential (primary) hypertension: Secondary | ICD-10-CM

## 2012-04-29 DIAGNOSIS — R9431 Abnormal electrocardiogram [ECG] [EKG]: Secondary | ICD-10-CM

## 2012-04-29 NOTE — Progress Notes (Deleted)
Name: [name] [name]    DOB: Oct 15, 1950  Age: 62 y.o.  MR#: [id number]       PCP:  Frazier Richards, PA-C      Insurance: Payor: BLUE CROSS BLUE [employer]IELD  Plan: BCBS [place] PPO  Product Type: *No Product type*    CC:    Chief Complaint  Patient presents with  . Shortness of Breath    VS Filed Vitals:   04/29/12 1105  BP: 118/68  Pulse: 73  Height: 5\' 6"  (1.676 m)  Weight: 199 lb (90.266 kg)    Weights Current Weight  04/29/12 199 lb (90.266 kg)  04/14/12 199 lb (90.266 kg)  04/14/12 199 lb (90.266 kg)    Blood Pressure  BP Readings from Last 3 Encounters:  04/29/12 118/68  04/14/12 125/71  04/14/12 125/71     Admit date:  (Not on file) Last encounter with RMR:  Visit date not found   Allergy Review of patient's allergies indicates no known allergies.  Current Outpatient Prescriptions  Medication Sig Dispense Refill  . Acetaminophen (TYLENOL EXTRA STRENGTH PO) Take 2 tablets by mouth as needed.      . Cholecalciferol (VITAMIN D) 2000 UNITS CAPS Take 2,000 Units by mouth daily.      Marland Kitchen lisinopril-hydrochlorothiazide (PRINZIDE,ZESTORETIC) 20-12.5 MG per tablet Take 1 tablet by mouth daily with breakfast.       . meloxicam (MOBIC) 15 MG tablet Take 15 mg by mouth daily with breakfast.      . metformin (FORTAMET) 1000 MG (OSM) 24 hr tablet Take 1 tablet (1,000 mg total) by mouth 2 (two) times daily.  180 tablet  0  . omeprazole (PRILOSEC) 20 MG capsule Take 20 mg by mouth daily.      Marland Kitchen PARoxetine (PAXIL) 20 MG tablet Take 20 mg by mouth every morning.      . simvastatin (ZOCOR) 40 MG tablet TAKE ONE TABLET BY MOUTH EVERY DAY AT BEDTIME  90 tablet  1   No current facility-administered medications for this visit.    Discontinued Meds:   There are no discontinued medications.  Patient Active Problem List  Diagnosis  . TINEA PEDIS  . DIABETES MELLITUS, TYPE II  . HYPERLIPIDEMIA  . DEPRESSION  . HYPERTENSION  . COPD  . CONSTIPATION NOS  . OSTEOARTHRITIS  . LOW BACK PAIN,  CHRONIC  . BURSITIS, HIPS, BILATERAL  . FIBROMYALGIA  . FATIGUE  . HEADACHE  . URINARY INCONTINENCE  . ABNORMAL ELECTROCARDIOGRAM  . Right ankle pain  . Difficulty in walking  . Pain in thoracic spine  . DDD (degenerative disc disease), lumbar    LABS    Component Value Date/Time   NA 140 04/13/2012 0909   NA 138 08/15/2011 0553   NA 135 08/14/2011 0430   K 4.5 04/13/2012 0909   K 3.8 08/15/2011 0553   K 4.1 08/14/2011 0430   CL 104 04/13/2012 0909   CL 104 08/15/2011 0553   CL 98 08/14/2011 0430   CO2 24 04/13/2012 0909   CO2 29 08/15/2011 0553   CO2 28 08/14/2011 0430   GLUCOSE 95 04/14/2012 1106   GLUCOSE 100* 04/13/2012 0909   GLUCOSE 137* 08/15/2011 0553   BUN 25* 04/13/2012 0909   BUN 9 08/15/2011 0553   BUN 11 08/14/2011 0430   CREATININE 1.40* 04/13/2012 0909   CREATININE 1.06 08/15/2011 0553   CREATININE 1.11* 08/14/2011 0430   CREATININE 0.78 08/13/2011 1143   CALCIUM 9.7 04/13/2012 0909   CALCIUM 9.1 08/15/2011  0553   CALCIUM 8.5 08/14/2011 0430   GFRNONAA 55* 08/15/2011 0553   GFRNONAA 52* 08/14/2011 0430   GFRNONAA 88* 08/13/2011 1143   GFRAA 64* 08/15/2011 0553   GFRAA 60* 08/14/2011 0430   GFRAA >90 08/13/2011 1143   CMP     Component Value Date/Time   NA 140 04/13/2012 0909   K 4.5 04/13/2012 0909   CL 104 04/13/2012 0909   CO2 24 04/13/2012 0909   GLUCOSE 95 04/14/2012 1106   BUN 25* 04/13/2012 0909   CREATININE 1.40* 04/13/2012 0909   CREATININE 1.06 08/15/2011 0553   CALCIUM 9.7 04/13/2012 0909   PROT 7.6 08/11/2011 1230   ALBUMIN 3.8 08/11/2011 1230   AST 21 08/11/2011 1230   ALT 18 08/11/2011 1230   ALKPHOS 70 08/11/2011 1230   BILITOT 0.3 08/11/2011 1230   GFRNONAA 55* 08/15/2011 0553   GFRAA 64* 08/15/2011 0553       Component Value Date/Time   WBC 12.3* 04/13/2012 0909   WBC 12.9* 08/11/2011 1225   WBC 14.2* 08/03/2011 1700   HGB 11.6* 04/13/2012 0909   HGB 10.1* 08/14/2011 0852   HGB 10.2* 08/14/2011 0430   HCT 34.7* 04/13/2012 0909   HCT 30.4* 08/14/2011 0852   HCT 30.7* 08/14/2011 0430   MCV 86.5 04/13/2012 0909    MCV 88.3 08/11/2011 1225   MCV 88.5 08/03/2011 1700    Lipid Panel     Component Value Date/Time   CHOL 220* 04/09/2008 2113   TRIG 190* 04/09/2008 2113   HDL 41 04/09/2008 2113   CHOLHDL 5.4 Ratio 04/09/2008 2113   VLDL 38 04/09/2008 2113   LDLCALC 141* 04/09/2008 2113    ABG    Component Value Date/Time   PHART 7.509* 04/14/2012 1055   PCO2ART 25.0* 04/14/2012 1055   PO2ART 44.0* 04/14/2012 1055   HCO3 21.6 04/14/2012 1059   TCO2 23 04/14/2012 1059   ACIDBASEDEF 4.0* 04/14/2012 1059   O2SAT 64.0 04/14/2012 1059     Lab Results  Component Value Date   TSH 1.078 04/08/2012   BNP (last 3 results) No results found for this basename: PROBNP,  in the last 8760 hours Cardiac Panel (last 3 results) No results found for this basename: CKTOTAL, CKMB, TROPONINI, RELINDX,  in the last 72 hours  Iron/TIBC/Ferritin No results found for this basename: iron, tibc, ferritin     EKG Orders placed in visit on 04/29/12  . EKG 12-LEAD     Prior Assessment and Plan Problem List as of 04/29/2012     ICD-9-CM   TINEA PEDIS   DIABETES MELLITUS, TYPE II   HYPERLIPIDEMIA   DEPRESSION   HYPERTENSION   COPD   CONSTIPATION NOS   OSTEOARTHRITIS   LOW BACK PAIN, CHRONIC   BURSITIS, HIPS, BILATERAL   FIBROMYALGIA   FATIGUE   HEADACHE   URINARY INCONTINENCE   ABNORMAL ELECTROCARDIOGRAM   Right ankle pain   Difficulty in walking   Pain in thoracic spine   DDD (degenerative disc disease), lumbar       Imaging: No results found.

## 2012-04-29 NOTE — Patient Instructions (Addendum)
Your physician recommends that you schedule a follow-up appointment in: AS NEEDED  

## 2012-04-29 NOTE — Assessment & Plan Note (Signed)
Currently well controlled. Will not change any medications at this time. Blood work is being followed by her primary care physician and by oncology.
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
## 2012-04-29 NOTE — Assessment & Plan Note (Signed)
Patient's cardiac catheterization as discussed above. She has no coronary artery disease. Normal LV function. She will continue risk factor management. See her on a when necessary basis, always available should primary care was asked to see her again.

## 2012-05-23 ENCOUNTER — Encounter: Payer: Self-pay | Admitting: Physician Assistant

## 2012-05-23 ENCOUNTER — Ambulatory Visit (INDEPENDENT_AMBULATORY_CARE_PROVIDER_SITE_OTHER): Payer: BC Managed Care – PPO | Admitting: Physician Assistant

## 2012-05-23 VITALS — BP 152/84 | HR 84 | Temp 97.5°F | Resp 18 | Ht 64.75 in | Wt 200.0 lb

## 2012-05-23 DIAGNOSIS — N289 Disorder of kidney and ureter, unspecified: Secondary | ICD-10-CM

## 2012-05-23 DIAGNOSIS — R5381 Other malaise: Secondary | ICD-10-CM

## 2012-05-23 DIAGNOSIS — C801 Malignant (primary) neoplasm, unspecified: Secondary | ICD-10-CM

## 2012-05-23 DIAGNOSIS — C649 Malignant neoplasm of unspecified kidney, except renal pelvis: Secondary | ICD-10-CM | POA: Insufficient documentation

## 2012-05-23 DIAGNOSIS — E119 Type 2 diabetes mellitus without complications: Secondary | ICD-10-CM

## 2012-05-23 DIAGNOSIS — N2889 Other specified disorders of kidney and ureter: Secondary | ICD-10-CM

## 2012-05-23 DIAGNOSIS — I10 Essential (primary) hypertension: Secondary | ICD-10-CM

## 2012-05-23 DIAGNOSIS — F3289 Other specified depressive episodes: Secondary | ICD-10-CM

## 2012-05-23 DIAGNOSIS — E785 Hyperlipidemia, unspecified: Secondary | ICD-10-CM

## 2012-05-23 DIAGNOSIS — F329 Major depressive disorder, single episode, unspecified: Secondary | ICD-10-CM

## 2012-05-23 LAB — COMPLETE METABOLIC PANEL WITH GFR
AST: 10 U/L (ref 0–37)
Albumin: 4.1 g/dL (ref 3.5–5.2)
Alkaline Phosphatase: 67 U/L (ref 39–117)
BUN: 21 mg/dL (ref 6–23)
Potassium: 4.5 mEq/L (ref 3.5–5.3)
Sodium: 143 mEq/L (ref 135–145)
Total Bilirubin: 0.5 mg/dL (ref 0.3–1.2)
Total Protein: 7 g/dL (ref 6.0–8.3)

## 2012-05-23 LAB — CBC WITH DIFFERENTIAL/PLATELET
Basophils Absolute: 0 10*3/uL (ref 0.0–0.1)
Basophils Relative: 0 % (ref 0–1)
MCHC: 34 g/dL (ref 30.0–36.0)
Neutro Abs: 6.1 10*3/uL (ref 1.7–7.7)
Neutrophils Relative %: 70 % (ref 43–77)
RDW: 15.5 % (ref 11.5–15.5)

## 2012-05-23 LAB — HEMOGLOBIN A1C: Hgb A1c MFr Bld: 6.2 % — ABNORMAL HIGH (ref <5.7)

## 2012-05-23 LAB — TSH: TSH: 1.246 u[IU]/mL (ref 0.350–4.500)

## 2012-05-24 MED ORDER — PAROXETINE HCL 20 MG PO TABS: 20.0000 mg | ORAL_TABLET | Freq: Two times a day (BID) | ORAL | Status: DC

## 2012-05-24 MED ORDER — METFORMIN HCL ER (OSM) 1000 MG PO TB24: 1000.0000 mg | ORAL_TABLET | Freq: Two times a day (BID) | ORAL | Status: DC

## 2012-05-24 MED ORDER — LISINOPRIL-HYDROCHLOROTHIAZIDE 20-12.5 MG PO TABS: 1.0000 | ORAL_TABLET | Freq: Every day | ORAL | Status: DC

## 2012-05-24 MED ORDER — SIMVASTATIN 40 MG PO TABS: ORAL_TABLET | ORAL | Status: DC

## 2012-05-24 MED ORDER — OMEPRAZOLE 20 MG PO CPDR: 20.0000 mg | DELAYED_RELEASE_CAPSULE | Freq: Every day | ORAL | Status: DC
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
## 2012-05-26 ENCOUNTER — Telehealth: Payer: Self-pay | Admitting: Family Medicine

## 2012-05-26 MED ORDER — METFORMIN HCL 1000 MG PO TABS: 1000.0000 mg | ORAL_TABLET | Freq: Two times a day (BID) | ORAL | Status: DC

## 2012-05-26 NOTE — Telephone Encounter (Signed)
[facility] pharmacy called and Metformin clarified as plain metformin 1000 mg BID w/meals

## 2012-05-26 NOTE — Telephone Encounter (Signed)
Rx for extended release Metformin (Fortamet)100 mg 24 hr tab with sig: one by mouth twice daily received.  Question is patient has been receiving plain Metformin 1000 mg BID.  Please clarify!

## 2012-05-26 NOTE — Telephone Encounter (Signed)
Yes change it to plain Metformin 1000mg  BID

## 2012-05-31 ENCOUNTER — Encounter (HOSPITAL_COMMUNITY): Payer: BC Managed Care – PPO | Attending: Hematology and Oncology

## 2012-05-31 ENCOUNTER — Ambulatory Visit (HOSPITAL_COMMUNITY)
Admission: RE | Admit: 2012-05-31 | Discharge: 2012-05-31 | Disposition: A | Discharge status: Home / Self Care | Payer: BC Managed Care – PPO | Source: Ambulatory Visit | Attending: Oncology | Admitting: Oncology

## 2012-05-31 DIAGNOSIS — E119 Type 2 diabetes mellitus without complications: Secondary | ICD-10-CM | POA: Insufficient documentation

## 2012-05-31 DIAGNOSIS — C3490 Malignant neoplasm of unspecified part of unspecified bronchus or lung: Secondary | ICD-10-CM | POA: Insufficient documentation

## 2012-05-31 DIAGNOSIS — C801 Malignant (primary) neoplasm, unspecified: Secondary | ICD-10-CM | POA: Insufficient documentation

## 2012-05-31 DIAGNOSIS — I10 Essential (primary) hypertension: Secondary | ICD-10-CM | POA: Insufficient documentation

## 2012-05-31 DIAGNOSIS — C649 Malignant neoplasm of unspecified kidney, except renal pelvis: Secondary | ICD-10-CM | POA: Insufficient documentation

## 2012-05-31 DIAGNOSIS — R918 Other nonspecific abnormal finding of lung field: Secondary | ICD-10-CM

## 2012-05-31 DIAGNOSIS — C641 Malignant neoplasm of right kidney, except renal pelvis: Secondary | ICD-10-CM

## 2012-05-31 LAB — CBC WITH DIFFERENTIAL/PLATELET
Basophils Absolute: 0 10*3/uL (ref 0.0–0.1)
Eosinophils Relative: 4 % (ref 0–5)
Lymphocytes Relative: 24 % (ref 12–46)
Lymphs Abs: 2.5 10*3/uL (ref 0.7–4.0)
MCV: 86.4 fL (ref 78.0–100.0)
Neutro Abs: 7.2 10*3/uL (ref 1.7–7.7)
Neutrophils Relative %: 69 % (ref 43–77)
Platelets: 371 10*3/uL (ref 150–400)
RBC: 4.04 MIL/uL (ref 3.87–5.11)
WBC: 10.5 10*3/uL (ref 4.0–10.5)

## 2012-05-31 LAB — COMPREHENSIVE METABOLIC PANEL
ALT: 9 U/L (ref 0–35)
AST: 13 U/L (ref 0–37)
Alkaline Phosphatase: 81 U/L (ref 39–117)
CO2: 21 mEq/L (ref 19–32)
Chloride: 102 mEq/L (ref 96–112)
GFR calc Af Amer: 51 mL/min — ABNORMAL LOW (ref >90)
GFR calc non Af Amer: 44 mL/min — ABNORMAL LOW (ref >90)
Glucose, Bld: 155 mg/dL — ABNORMAL HIGH (ref 70–99)
Potassium: 4.1 mEq/L (ref 3.5–5.1)
Sodium: 139 mEq/L (ref 135–145)
Total Bilirubin: 0.4 mg/dL (ref 0.3–1.2)

## 2012-05-31 NOTE — Progress Notes (Signed)
Labs drawn today for cbc/diff,cmp 

## 2012-06-01 ENCOUNTER — Encounter (HOSPITAL_COMMUNITY): Payer: Self-pay | Admitting: Oncology
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
## 2012-06-02 ENCOUNTER — Encounter (HOSPITAL_COMMUNITY): Payer: Self-pay | Admitting: Oncology

## 2012-06-02 ENCOUNTER — Encounter (HOSPITAL_BASED_OUTPATIENT_CLINIC_OR_DEPARTMENT_OTHER): Payer: BC Managed Care – PPO | Admitting: Oncology

## 2012-06-02 VITALS — BP 142/80 | HR 90 | Temp 98.0°F | Resp 18 | Wt 199.2 lb

## 2012-06-02 DIAGNOSIS — C641 Malignant neoplasm of right kidney, except renal pelvis: Secondary | ICD-10-CM

## 2012-06-02 DIAGNOSIS — C649 Malignant neoplasm of unspecified kidney, except renal pelvis: Secondary | ICD-10-CM

## 2012-06-02 MED ORDER — PAZOPANIB HCL 200 MG PO TABS: 800.0000 mg | ORAL_TABLET | Freq: Every day | ORAL | Status: DC

## 2012-06-02 NOTE — Patient Instructions (Addendum)
[facility] Discharge Instructions  RECOMMENDATIONS MADE BY THE CONSULTANT AND ANY TEST RESULTS WILL BE SENT TO YOUR REFERRING PHYSICIAN.  We will get you scheduled for a  CT scan of your abdomen and pelvis sometime next week. We will schedule you for chemotherapy teaching for the medication Votrient. Lab work to start in apx 3 weeks and then we will monitor your blood counts every 2 weeks after that. Return to see MD again in 5 weeks. Report any issues/concerns to clinic as needed.  Thank you for choosing [facility] to provide your oncology and hematology care.  To afford each patient quality time with our providers, please arrive at least 15 minutes before your scheduled appointment time.  With your help, our goal is to use those 15 minutes to complete the necessary work-up to ensure our physicians have the information they need to help with your evaluation and healthcare recommendations.    Effective January 1st, 2014, we ask that you re-schedule your appointment with our physicians should you arrive 10 or more minutes late for your appointment.  We strive to give you quality time with our providers, and arriving late affects you and other patients whose appointments are after yours.    Again, thank you for choosing [facility].  Our hope is that these requests will decrease the amount of time that you wait before being seen by our physicians.       _____________________________________________________________  Should you have questions after your visit to [facility], please contact our office at [phone] between the hours of 8:30 a.m. and 5:00 p.m.  Voicemails left after 4:30 p.m. will not be returned until the following business day.  For prescription refill requests, have your pharmacy contact our office with your prescription refill request.

## 2012-06-07 ENCOUNTER — Other Ambulatory Visit (HOSPITAL_COMMUNITY): Payer: BC Managed Care – PPO

## 2012-06-07 NOTE — Patient Instructions (Addendum)
[facility]   CHEMOTHERAPY INSTRUCTIONS  Votrient - is indicated for the treatment of patients with advanced renal cell carcinoma.   VOTRIENT must be taken without food at least 1 hour before or 2 hours after a meal.  Do not crush, cut, or chew tablets due to the potential for increased rate of absorption, which may affect systemic exposure.  If a dose is missed, it should not be taken if it is less than 12 hours until the next dose. Do not eat grapefruit or drink grapefruit juice while being treated with Votrient. Use of Votrient along with Simvastatin may increase your ALT level (liver function test).   Most common adverse reactions observed in patients taking VOTRIENT were diarrhea, hypertension, hair color changes (depigmentation), nausea, anorexia, and vomiting.  Other serious adverse reactions include:  Severe and fatal liver toxicity  (we will be monitoring your liver function every 2 weeks)  Congestive Heart failure/Heart Dysfunction  (we will be monitoring your blood pressure every 2 weeks). Let us know if you have swelling in your legs or trouble breathing. Make sure you take your blood pressure medication daily as prescribed.  We will also be checking your urine every 4 weeks for protein.   EDUCATIONAL MATERIALS GIVEN AND REVIEWED: Chemotherapy and You   SELF CARE ACTIVITIES WHILE ON CHEMOTHERAPY: Increase your fluid intake 48 hours prior to treatment and drink at least 2 quarts per day after treatment., No alcohol intake., No aspirin or other medications unless approved by your oncologist., Eat foods that are light and easy to digest., Eat foods at cold or room temperature., No fried, fatty, or spicy foods immediately before or after treatment., Have teeth cleaned professionally before starting treatment. Keep dentures and partial plates clean., Use soft toothbrush and do not use mouthwashes that contain alcohol. Biotene is a good mouthwash that  is available at most pharmacies or may be ordered by calling [phone]., Use warm salt water gargles (1 teaspoon salt per 1 quart warm water) before and after meals and at bedtime. Or you may rinse with 2 tablespoons of three -percent hydrogen peroxide mixed in eight ounces of water., Always use sunscreen with SPF (Sun Protection Factor) of 30 or higher., Use your nausea medication as directed to prevent nausea., Use your stool softener or laxative as directed to prevent constipation. and Use your anti-diarrheal medication as directed to stop diarrhea.  Please wash your hands for at least 30 seconds using warm soapy water. Handwashing is the #1 way to prevent the spread of germs. Stay away from sick people or people who are getting over a cold. If you develop respiratory systems such as green/yellow mucus production or productive cough or persistent cough let us know and we will see if you need an antibiotic. It is a good idea to keep a pair of gloves on when going into grocery stores/[employer] to decrease your risk of coming into contact with germs on the carts, etc. Carry alcohol hand gel with you at all times and use it frequently if out in public. All foods need to be cooked thoroughly. No raw foods. No medium or undercooked meats, eggs. If your food is cooked medium well, it does not need to be hot pink or saturated with bloody liquid at all. Vegetables and fruits need to be washed/rinsed under the faucet with a dish detergent before being consumed. You can eat raw fruits and vegetables unless we tell you otherwise but it would be  best if you cooked them or bought frozen. Do not eat off of salad bars or hot bars unless you really trust the cleanliness of the restaurant. If you need dental work, please let Dr. Mariel Sleet know before you go for your appointment so that we can coordinate the best possible time for you in regards to your chemo regimen. You need to also let your dentist know that you are  actively taking chemo. We may need to do labs prior to your dental appointment. We also want your bowels moving at least every other day. If this is not happening, we need to know so that we can get you on a bowel regimen to help you go.    MEDICATIONS: You have been given prescriptions for the following medications:  Zofran (ondansetron) 8mg  tablet. May take 1 tablet two times a day IF needed for nausea/vomiting.     SYMPTOMS TO REPORT AS SOON AS POSSIBLE AFTER TREATMENT:  FEVER GREATER THAN 100.5 F  CHILLS WITH OR WITHOUT FEVER  NAUSEA AND VOMITING THAT IS NOT CONTROLLED WITH YOUR NAUSEA MEDICATION  UNUSUAL SHORTNESS OF BREATH  UNUSUAL BRUISING OR BLEEDING  TENDERNESS IN MOUTH AND THROAT WITH OR WITHOUT PRESENCE OF ULCERS  URINARY PROBLEMS  BOWEL PROBLEMS  UNUSUAL RASH    I have been informed and understand all of the instructions given to me and have received a copy. I have been instructed to call the clinic [phone] or my family physician as soon as possible for continued medical care, if indicated. I do not have any more questions at this time but understand that I may call the Cancer Center or the Patient Navigator at [phone] during office hours should I have questions or need assistance in obtaining follow-up care.      _________________________________________      _______________     __________ Signature of Patient or Authorized Representative        Date                            Time      _________________________________________ Nurse's Signature

## 2012-06-09 ENCOUNTER — Ambulatory Visit (HOSPITAL_COMMUNITY)
Admission: RE | Admit: 2012-06-09 | Discharge: 2012-06-09 | Disposition: A | Discharge status: Home / Self Care | Payer: BC Managed Care – PPO | Source: Ambulatory Visit | Attending: Oncology | Admitting: Oncology

## 2012-06-09 ENCOUNTER — Encounter (HOSPITAL_BASED_OUTPATIENT_CLINIC_OR_DEPARTMENT_OTHER): Payer: BC Managed Care – PPO

## 2012-06-09 DIAGNOSIS — C801 Malignant (primary) neoplasm, unspecified: Secondary | ICD-10-CM

## 2012-06-09 DIAGNOSIS — C3490 Malignant neoplasm of unspecified part of unspecified bronchus or lung: Secondary | ICD-10-CM | POA: Insufficient documentation

## 2012-06-09 DIAGNOSIS — C7900 Secondary malignant neoplasm of unspecified kidney and renal pelvis: Secondary | ICD-10-CM | POA: Insufficient documentation

## 2012-06-09 DIAGNOSIS — C641 Malignant neoplasm of right kidney, except renal pelvis: Secondary | ICD-10-CM

## 2012-06-09 DIAGNOSIS — K7460 Unspecified cirrhosis of liver: Secondary | ICD-10-CM | POA: Insufficient documentation

## 2012-06-09 DIAGNOSIS — C649 Malignant neoplasm of unspecified kidney, except renal pelvis: Secondary | ICD-10-CM

## 2012-06-09 MED ORDER — IOHEXOL 300 MG/ML  SOLN
80.0000 mL | Freq: Once | INTRAMUSCULAR | Status: AC | PRN
  Administered 2012-06-09: 80 mL via INTRAVENOUS

## 2012-06-09 NOTE — Progress Notes (Signed)
Chemo teaching done and consent signed for Votrient. Pt has already picked up Zofran from pharmacy.

## 2012-06-10 ENCOUNTER — Telehealth (HOSPITAL_COMMUNITY): Payer: Self-pay

## 2012-06-10 NOTE — Telephone Encounter (Signed)
Call from patient - wants results from CTs done 5/29 can be reached at home [phone] or cell # [phone].

## 2012-06-13 ENCOUNTER — Telehealth (HOSPITAL_COMMUNITY): Payer: Self-pay

## 2012-06-13 ENCOUNTER — Ambulatory Visit (HOSPITAL_COMMUNITY): Payer: BC Managed Care – PPO | Admitting: Oncology

## 2012-06-13 NOTE — Telephone Encounter (Signed)
Chayanne wants to know if Dr. Mariel Sleet has talked with the radiologist about the CT scan regarding the area near kidney?  Can be reached at [phone] or by cell [phone].

## 2012-06-16 ENCOUNTER — Other Ambulatory Visit (HOSPITAL_COMMUNITY): Payer: Self-pay | Admitting: Urology

## 2012-06-16 DIAGNOSIS — I8290 Acute embolism and thrombosis of unspecified vein: Secondary | ICD-10-CM

## 2012-06-16 DIAGNOSIS — N2889 Other specified disorders of kidney and ureter: Secondary | ICD-10-CM

## 2012-06-17 ENCOUNTER — Ambulatory Visit (HOSPITAL_COMMUNITY)
Admission: RE | Admit: 2012-06-17 | Discharge: 2012-06-17 | Disposition: A | Discharge status: Home / Self Care | Payer: BC Managed Care – PPO | Source: Ambulatory Visit | Attending: Urology | Admitting: Urology

## 2012-06-17 DIAGNOSIS — R109 Unspecified abdominal pain: Secondary | ICD-10-CM | POA: Insufficient documentation

## 2012-06-17 DIAGNOSIS — I8290 Acute embolism and thrombosis of unspecified vein: Secondary | ICD-10-CM

## 2012-06-17 DIAGNOSIS — Z9089 Acquired absence of other organs: Secondary | ICD-10-CM | POA: Insufficient documentation

## 2012-06-17 DIAGNOSIS — K7689 Other specified diseases of liver: Secondary | ICD-10-CM | POA: Insufficient documentation

## 2012-06-17 DIAGNOSIS — D4959 Neoplasm of unspecified behavior of other genitourinary organ: Secondary | ICD-10-CM | POA: Insufficient documentation

## 2012-06-17 DIAGNOSIS — N2889 Other specified disorders of kidney and ureter: Secondary | ICD-10-CM

## 2012-06-17 DIAGNOSIS — Z85528 Personal history of other malignant neoplasm of kidney: Secondary | ICD-10-CM | POA: Insufficient documentation

## 2012-06-17 DIAGNOSIS — Z905 Acquired absence of kidney: Secondary | ICD-10-CM | POA: Insufficient documentation

## 2012-06-17 DIAGNOSIS — D739 Disease of spleen, unspecified: Secondary | ICD-10-CM | POA: Insufficient documentation

## 2012-06-17 DIAGNOSIS — Z90710 Acquired absence of both cervix and uterus: Secondary | ICD-10-CM | POA: Insufficient documentation

## 2012-06-17 MED ORDER — GADOBENATE DIMEGLUMINE 529 MG/ML IV SOLN
10.0000 mL | Freq: Once | INTRAVENOUS | Status: AC | PRN
  Administered 2012-06-17: 10 mL via INTRAVENOUS

## 2012-06-22 ENCOUNTER — Other Ambulatory Visit: Payer: Self-pay | Admitting: Physician Assistant

## 2012-06-22 NOTE — Telephone Encounter (Signed)
Medication refilled per protocol. 

## 2012-06-23 ENCOUNTER — Encounter (HOSPITAL_COMMUNITY): Payer: BC Managed Care – PPO | Attending: Hematology and Oncology

## 2012-06-23 DIAGNOSIS — C649 Malignant neoplasm of unspecified kidney, except renal pelvis: Secondary | ICD-10-CM | POA: Insufficient documentation

## 2012-06-23 DIAGNOSIS — C801 Malignant (primary) neoplasm, unspecified: Secondary | ICD-10-CM | POA: Insufficient documentation

## 2012-06-23 DIAGNOSIS — R1013 Epigastric pain: Secondary | ICD-10-CM | POA: Insufficient documentation

## 2012-06-23 DIAGNOSIS — C641 Malignant neoplasm of right kidney, except renal pelvis: Secondary | ICD-10-CM

## 2012-06-23 LAB — COMPREHENSIVE METABOLIC PANEL
AST: 15 U/L (ref 0–37)
Albumin: 3.6 g/dL (ref 3.5–5.2)
Alkaline Phosphatase: 73 U/L (ref 39–117)
BUN: 27 mg/dL — ABNORMAL HIGH (ref 6–23)
Chloride: 100 mEq/L (ref 96–112)
Creatinine, Ser: 1.49 mg/dL — ABNORMAL HIGH (ref 0.50–1.10)
Potassium: 4.9 mEq/L (ref 3.5–5.1)
Total Bilirubin: 0.3 mg/dL (ref 0.3–1.2)
Total Protein: 7.5 g/dL (ref 6.0–8.3)

## 2012-06-23 LAB — CBC WITH DIFFERENTIAL/PLATELET
Basophils Absolute: 0 10*3/uL (ref 0.0–0.1)
Basophils Relative: 1 % (ref 0–1)
Eosinophils Absolute: 0.4 10*3/uL (ref 0.0–0.7)
MCH: 27.8 pg (ref 26.0–34.0)
MCHC: 32.7 g/dL (ref 30.0–36.0)
Neutro Abs: 5.5 10*3/uL (ref 1.7–7.7)
Neutrophils Relative %: 64 % (ref 43–77)
RDW: 14.2 % (ref 11.5–15.5)

## 2012-06-23 LAB — URINALYSIS, DIPSTICK ONLY
Bilirubin Urine: NEGATIVE
Ketones, ur: NEGATIVE mg/dL
Nitrite: NEGATIVE
Specific Gravity, Urine: 1.025 (ref 1.005–1.030)
pH: 5.5 (ref 5.0–8.0)

## 2012-06-23 NOTE — Progress Notes (Signed)
Labs drawn today for cbc/diff,cmp,ua dipstick

## 2012-06-30 DIAGNOSIS — Z85528 Personal history of other malignant neoplasm of kidney: Secondary | ICD-10-CM | POA: Insufficient documentation

## 2012-07-07 ENCOUNTER — Ambulatory Visit (HOSPITAL_COMMUNITY): Payer: BC Managed Care – PPO | Admitting: Oncology

## 2012-07-07 ENCOUNTER — Other Ambulatory Visit (HOSPITAL_COMMUNITY): Payer: BC Managed Care – PPO

## 2012-07-07 ENCOUNTER — Encounter (HOSPITAL_BASED_OUTPATIENT_CLINIC_OR_DEPARTMENT_OTHER): Payer: BC Managed Care – PPO | Admitting: Oncology

## 2012-07-07 ENCOUNTER — Encounter (HOSPITAL_BASED_OUTPATIENT_CLINIC_OR_DEPARTMENT_OTHER): Payer: BC Managed Care – PPO

## 2012-07-07 VITALS — BP 146/80 | HR 83 | Temp 97.7°F | Resp 18 | Wt 195.5 lb

## 2012-07-07 DIAGNOSIS — C649 Malignant neoplasm of unspecified kidney, except renal pelvis: Secondary | ICD-10-CM

## 2012-07-07 DIAGNOSIS — C641 Malignant neoplasm of right kidney, except renal pelvis: Secondary | ICD-10-CM

## 2012-07-07 DIAGNOSIS — R1013 Epigastric pain: Secondary | ICD-10-CM

## 2012-07-07 DIAGNOSIS — C801 Malignant (primary) neoplasm, unspecified: Secondary | ICD-10-CM

## 2012-07-07 LAB — COMPREHENSIVE METABOLIC PANEL
ALT: 10 U/L (ref 0–35)
AST: 15 U/L (ref 0–37)
Alkaline Phosphatase: 64 U/L (ref 39–117)
CO2: 23 mEq/L (ref 19–32)
Chloride: 100 mEq/L (ref 96–112)
GFR calc non Af Amer: 57 mL/min — ABNORMAL LOW (ref >90)
Sodium: 139 mEq/L (ref 135–145)
Total Bilirubin: 0.4 mg/dL (ref 0.3–1.2)

## 2012-07-07 LAB — CBC WITH DIFFERENTIAL/PLATELET
Basophils Relative: 0 % (ref 0–1)
Eosinophils Absolute: 0.3 10*3/uL (ref 0.0–0.7)
Eosinophils Relative: 3 % (ref 0–5)
Hemoglobin: 11.6 g/dL — ABNORMAL LOW (ref 12.0–15.0)
Lymphs Abs: 2.3 10*3/uL (ref 0.7–4.0)
MCH: 28.2 pg (ref 26.0–34.0)
MCHC: 32.8 g/dL (ref 30.0–36.0)
MCV: 86.1 fL (ref 78.0–100.0)
Monocytes Absolute: 0.4 10*3/uL (ref 0.1–1.0)
Monocytes Relative: 4 % (ref 3–12)
RBC: 4.11 MIL/uL (ref 3.87–5.11)

## 2012-07-07 MED ORDER — SUCRALFATE 1 GM/10ML PO SUSP: 1.0000 g | Freq: Four times a day (QID) | ORAL | Status: DC
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
## 2012-07-07 NOTE — Progress Notes (Signed)
Labs drawn today for cbc/diff,cmp 

## 2012-07-07 NOTE — Patient Instructions (Signed)
[facility] Discharge Instructions  RECOMMENDATIONS MADE BY THE CONSULTANT AND ANY TEST RESULTS WILL BE SENT TO YOUR REFERRING PHYSICIAN.  EXAM FINDINGS BY THE PHYSICIAN TODAY AND SIGNS OR SYMPTOMS TO REPORT TO CLINIC OR PRIMARY PHYSICIAN: Exam findings as discussed by T. Jacalyn Lefevre, PA-C.    MEDICATIONS PRESCRIBED:  1.  Carafate prescription called in to your pharmacy - please take as prescribed.  SPECIAL INSTRUCTIONS/FOLLOW-UP: 1.  You will be scheduled for labs every 2 weeks, urinalysis every month. 2.  Please return in 2 month to be seen in our office.  Thank you for choosing [facility] to provide your oncology and hematology care.  To afford each patient quality time with our providers, please arrive at least 15 minutes before your scheduled appointment time.  With your help, our goal is to use those 15 minutes to complete the necessary work-up to ensure our physicians have the information they need to help with your evaluation and healthcare recommendations.    Effective January 1st, 2014, we ask that you re-schedule your appointment with our physicians should you arrive 10 or more minutes late for your appointment.  We strive to give you quality time with our providers, and arriving late affects you and other patients whose appointments are after yours.    Again, thank you for choosing [facility].  Our hope is that these requests will decrease the amount of time that you wait before being seen by our physicians.       _____________________________________________________________  Should you have questions after your visit to [facility], please contact our office at [phone] between the hours of 8:30 a.m. and 5:00 p.m.  Voicemails left after 4:30 p.m. will not be returned until the following business day.  For prescription refill requests, have your pharmacy contact our office with your prescription refill request.

## 2012-07-11 ENCOUNTER — Other Ambulatory Visit (HOSPITAL_COMMUNITY): Payer: Self-pay | Admitting: Oncology

## 2012-07-11 DIAGNOSIS — C641 Malignant neoplasm of right kidney, except renal pelvis: Secondary | ICD-10-CM

## 2012-07-11 MED ORDER — PAZOPANIB HCL 200 MG PO TABS: 800.0000 mg | ORAL_TABLET | Freq: Every day | ORAL | Status: DC

## 2012-07-19 ENCOUNTER — Other Ambulatory Visit (HOSPITAL_COMMUNITY): Payer: Self-pay | Admitting: *Deleted

## 2012-07-19 DIAGNOSIS — C641 Malignant neoplasm of right kidney, except renal pelvis: Secondary | ICD-10-CM

## 2012-07-19 MED ORDER — ONDANSETRON HCL 8 MG PO TABS: 8.0000 mg | ORAL_TABLET | Freq: Two times a day (BID) | ORAL | Status: DC | PRN

## 2012-07-20 ENCOUNTER — Other Ambulatory Visit (HOSPITAL_COMMUNITY): Payer: Self-pay | Admitting: *Deleted

## 2012-07-20 DIAGNOSIS — C641 Malignant neoplasm of right kidney, except renal pelvis: Secondary | ICD-10-CM

## 2012-07-20 MED ORDER — RANITIDINE HCL 150 MG PO TABS: 150.0000 mg | ORAL_TABLET | Freq: Every day | ORAL | Status: DC

## 2012-07-20 MED ORDER — RANITIDINE HCL 150 MG PO TABS: 150.0000 mg | ORAL_TABLET | Freq: Two times a day (BID) | ORAL | Status: DC

## 2012-07-21 ENCOUNTER — Encounter (HOSPITAL_COMMUNITY): Payer: BC Managed Care – PPO | Attending: Hematology and Oncology

## 2012-07-21 ENCOUNTER — Other Ambulatory Visit (HOSPITAL_COMMUNITY): Payer: Self-pay | Admitting: Oncology

## 2012-07-21 DIAGNOSIS — C649 Malignant neoplasm of unspecified kidney, except renal pelvis: Secondary | ICD-10-CM

## 2012-07-21 DIAGNOSIS — R82998 Other abnormal findings in urine: Secondary | ICD-10-CM

## 2012-07-21 DIAGNOSIS — R5381 Other malaise: Secondary | ICD-10-CM | POA: Insufficient documentation

## 2012-07-21 DIAGNOSIS — R5383 Other fatigue: Secondary | ICD-10-CM | POA: Insufficient documentation

## 2012-07-21 DIAGNOSIS — G4700 Insomnia, unspecified: Secondary | ICD-10-CM | POA: Insufficient documentation

## 2012-07-21 DIAGNOSIS — C641 Malignant neoplasm of right kidney, except renal pelvis: Secondary | ICD-10-CM

## 2012-07-21 DIAGNOSIS — L659 Nonscarring hair loss, unspecified: Secondary | ICD-10-CM | POA: Insufficient documentation

## 2012-07-21 DIAGNOSIS — C801 Malignant (primary) neoplasm, unspecified: Secondary | ICD-10-CM

## 2012-07-21 LAB — URINALYSIS, DIPSTICK ONLY
Bilirubin Urine: NEGATIVE
Glucose, UA: NEGATIVE mg/dL
Specific Gravity, Urine: 1.015 (ref 1.005–1.030)
Urobilinogen, UA: 0.2 mg/dL (ref 0.0–1.0)
pH: 5.5 (ref 5.0–8.0)

## 2012-07-21 LAB — CBC WITH DIFFERENTIAL/PLATELET
Basophils Relative: 0 % (ref 0–1)
Eosinophils Absolute: 0.4 10*3/uL (ref 0.0–0.7)
MCH: 28.7 pg (ref 26.0–34.0)
MCHC: 33.2 g/dL (ref 30.0–36.0)
Monocytes Relative: 4 % (ref 3–12)
Neutrophils Relative %: 68 % (ref 43–77)
Platelets: 260 10*3/uL (ref 150–400)

## 2012-07-21 LAB — COMPREHENSIVE METABOLIC PANEL
Albumin: 3.6 g/dL (ref 3.5–5.2)
Alkaline Phosphatase: 68 U/L (ref 39–117)
BUN: 26 mg/dL — ABNORMAL HIGH (ref 6–23)
Potassium: 4.7 mEq/L (ref 3.5–5.1)
Sodium: 137 mEq/L (ref 135–145)
Total Protein: 7.7 g/dL (ref 6.0–8.3)

## 2012-07-21 MED ORDER — CIPROFLOXACIN HCL 500 MG PO TABS: 500.0000 mg | ORAL_TABLET | Freq: Two times a day (BID) | ORAL | Status: DC

## 2012-07-21 NOTE — Progress Notes (Signed)
Labs drawn today for cbc/diff,cmp,urine dipstick 

## 2012-08-04 ENCOUNTER — Other Ambulatory Visit (HOSPITAL_COMMUNITY): Payer: BC Managed Care – PPO

## 2012-08-09 ENCOUNTER — Encounter (HOSPITAL_BASED_OUTPATIENT_CLINIC_OR_DEPARTMENT_OTHER): Payer: BC Managed Care – PPO | Admitting: Oncology

## 2012-08-09 ENCOUNTER — Encounter (HOSPITAL_BASED_OUTPATIENT_CLINIC_OR_DEPARTMENT_OTHER): Payer: BC Managed Care – PPO

## 2012-08-09 ENCOUNTER — Encounter (HOSPITAL_COMMUNITY): Payer: Self-pay | Admitting: Oncology

## 2012-08-09 VITALS — BP 144/88 | HR 71 | Temp 98.1°F | Resp 20 | Wt 193.6 lb

## 2012-08-09 DIAGNOSIS — R5383 Other fatigue: Secondary | ICD-10-CM

## 2012-08-09 DIAGNOSIS — C641 Malignant neoplasm of right kidney, except renal pelvis: Secondary | ICD-10-CM

## 2012-08-09 DIAGNOSIS — C649 Malignant neoplasm of unspecified kidney, except renal pelvis: Secondary | ICD-10-CM

## 2012-08-09 DIAGNOSIS — C801 Malignant (primary) neoplasm, unspecified: Secondary | ICD-10-CM

## 2012-08-09 DIAGNOSIS — G4700 Insomnia, unspecified: Secondary | ICD-10-CM

## 2012-08-09 DIAGNOSIS — L659 Nonscarring hair loss, unspecified: Secondary | ICD-10-CM

## 2012-08-09 DIAGNOSIS — R5381 Other malaise: Secondary | ICD-10-CM

## 2012-08-09 LAB — CBC WITH DIFFERENTIAL/PLATELET
Eosinophils Absolute: 0.3 10*3/uL (ref 0.0–0.7)
Eosinophils Relative: 4 % (ref 0–5)
HCT: 37.8 % (ref 36.0–46.0)
Hemoglobin: 12.7 g/dL (ref 12.0–15.0)
Lymphs Abs: 1.9 10*3/uL (ref 0.7–4.0)
MCH: 29.1 pg (ref 26.0–34.0)
MCV: 86.7 fL (ref 78.0–100.0)
Monocytes Absolute: 0.3 10*3/uL (ref 0.1–1.0)
Monocytes Relative: 3 % (ref 3–12)
RBC: 4.36 MIL/uL (ref 3.87–5.11)

## 2012-08-09 LAB — COMPREHENSIVE METABOLIC PANEL
ALT: 38 U/L — ABNORMAL HIGH (ref 0–35)
AST: 39 U/L — ABNORMAL HIGH (ref 0–37)
Albumin: 3.5 g/dL (ref 3.5–5.2)
Alkaline Phosphatase: 64 U/L (ref 39–117)
CO2: 27 mEq/L (ref 19–32)
Chloride: 100 mEq/L (ref 96–112)
Creatinine, Ser: 1.28 mg/dL — ABNORMAL HIGH (ref 0.50–1.10)
GFR calc non Af Amer: 44 mL/min — ABNORMAL LOW (ref >90)
Potassium: 4.1 mEq/L (ref 3.5–5.1)
Total Bilirubin: 0.4 mg/dL (ref 0.3–1.2)

## 2012-08-09 MED ORDER — ZOLPIDEM TARTRATE 5 MG PO TABS: 5.0000 mg | ORAL_TABLET | Freq: Every evening | ORAL | Status: DC | PRN

## 2012-08-09 NOTE — Progress Notes (Signed)
Labs drawn today for cbc/diff,cmp 
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
## 2012-08-09 NOTE — Progress Notes (Signed)
[name] [name]'s reason for visit today are for MD visit and labs as scheduled.  Venipuncture performed with a 23 gauge butterfly needle to L Antecubital.  [name] [name] tolerated venipuncture well and without incident; questions were answered and patient was discharged.

## 2012-08-09 NOTE — Patient Instructions (Addendum)
[facility] Discharge Instructions  RECOMMENDATIONS MADE BY THE CONSULTANT AND ANY TEST RESULTS WILL BE SENT TO YOUR REFERRING PHYSICIAN.  EXAM FINDINGS BY THE PHYSICIAN TODAY AND SIGNS OR SYMPTOMS TO REPORT TO CLINIC OR PRIMARY PHYSICIAN: Exam findings as discussed by T. Kefalas, PA-C.  SPECIAL INSTRUCTIONS/FOLLOW-UP: 1.  We will contact you with the results of labs we performed today.  2.  Continue to have labs every 2 weeks. 3.  Return in 4 weeks for an office visit after you're seen at [facility].  MEDICATIONS: 1.  You were prescribed a 30 day supply of Ambien - before that runs out, please follow-up with your PCP for refills.  Thank you for choosing [facility] to provide your oncology and hematology care.  To afford each patient quality time with our providers, please arrive at least 15 minutes before your scheduled appointment time.  With your help, our goal is to use those 15 minutes to complete the necessary work-up to ensure our physicians have the information they need to help with your evaluation and healthcare recommendations.    Effective January 1st, 2014, we ask that you re-schedule your appointment with our physicians should you arrive 10 or more minutes late for your appointment.  We strive to give you quality time with our providers, and arriving late affects you and other patients whose appointments are after yours.    Again, thank you for choosing [facility].  Our hope is that these requests will decrease the amount of time that you wait before being seen by our physicians.       _____________________________________________________________  Should you have questions after your visit to [facility], please contact our office at [phone] between the hours of 8:30 a.m. and 5:00 p.m.  Voicemails left after 4:30 p.m. will not be returned until the following business day.  For prescription refill requests, have your  pharmacy contact our office with your prescription refill request.

## 2012-08-11 ENCOUNTER — Other Ambulatory Visit (HOSPITAL_COMMUNITY): Payer: Self-pay | Admitting: Oncology

## 2012-08-11 DIAGNOSIS — C641 Malignant neoplasm of right kidney, except renal pelvis: Secondary | ICD-10-CM

## 2012-08-11 MED ORDER — PAZOPANIB HCL 200 MG PO TABS: 800.0000 mg | ORAL_TABLET | Freq: Every day | ORAL | Status: DC

## 2012-08-23 ENCOUNTER — Encounter (HOSPITAL_COMMUNITY): Payer: BC Managed Care – PPO | Attending: Hematology and Oncology

## 2012-08-23 DIAGNOSIS — C641 Malignant neoplasm of right kidney, except renal pelvis: Secondary | ICD-10-CM

## 2012-08-23 DIAGNOSIS — C649 Malignant neoplasm of unspecified kidney, except renal pelvis: Secondary | ICD-10-CM | POA: Insufficient documentation

## 2012-08-23 DIAGNOSIS — C801 Malignant (primary) neoplasm, unspecified: Secondary | ICD-10-CM | POA: Insufficient documentation

## 2012-08-23 LAB — URINALYSIS, DIPSTICK ONLY
Ketones, ur: NEGATIVE mg/dL
Nitrite: NEGATIVE
Protein, ur: NEGATIVE mg/dL

## 2012-08-23 LAB — CBC WITH DIFFERENTIAL/PLATELET
Basophils Absolute: 0 10*3/uL (ref 0.0–0.1)
Basophils Relative: 0 % (ref 0–1)
Eosinophils Absolute: 0.4 10*3/uL (ref 0.0–0.7)
Eosinophils Relative: 4 % (ref 0–5)
HCT: 40.6 % (ref 36.0–46.0)
MCH: 29.6 pg (ref 26.0–34.0)
MCHC: 33.5 g/dL (ref 30.0–36.0)
MCV: 88.3 fL (ref 78.0–100.0)
Monocytes Absolute: 0.5 10*3/uL (ref 0.1–1.0)
Platelets: 235 10*3/uL (ref 150–400)
RDW: 17.7 % — ABNORMAL HIGH (ref 11.5–15.5)
WBC: 9.3 10*3/uL (ref 4.0–10.5)

## 2012-08-23 LAB — COMPREHENSIVE METABOLIC PANEL
ALT: 44 U/L — ABNORMAL HIGH (ref 0–35)
AST: 34 U/L (ref 0–37)
CO2: 22 mEq/L (ref 19–32)
Calcium: 10.2 mg/dL (ref 8.4–10.5)
Creatinine, Ser: 1.18 mg/dL — ABNORMAL HIGH (ref 0.50–1.10)
GFR calc Af Amer: 56 mL/min — ABNORMAL LOW (ref >90)
GFR calc non Af Amer: 48 mL/min — ABNORMAL LOW (ref >90)
Sodium: 137 mEq/L (ref 135–145)
Total Protein: 7.4 g/dL (ref 6.0–8.3)

## 2012-08-23 NOTE — Progress Notes (Signed)
Labs drawn today for cbc/diff,cmp,urine dipstick 

## 2012-08-25 ENCOUNTER — Other Ambulatory Visit (HOSPITAL_COMMUNITY): Payer: Self-pay | Admitting: Oncology

## 2012-09-08 ENCOUNTER — Encounter (HOSPITAL_BASED_OUTPATIENT_CLINIC_OR_DEPARTMENT_OTHER): Payer: BC Managed Care – PPO | Admitting: Oncology

## 2012-09-08 VITALS — BP 111/75 | HR 89 | Temp 97.9°F | Resp 20 | Wt 194.5 lb

## 2012-09-08 DIAGNOSIS — C7800 Secondary malignant neoplasm of unspecified lung: Secondary | ICD-10-CM

## 2012-09-08 DIAGNOSIS — C649 Malignant neoplasm of unspecified kidney, except renal pelvis: Secondary | ICD-10-CM
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
## 2012-09-08 NOTE — Patient Instructions (Addendum)
[facility] Discharge Instructions  RECOMMENDATIONS MADE BY THE CONSULTANT AND ANY TEST RESULTS WILL BE SENT TO YOUR REFERRING PHYSICIAN.  EXAM FINDINGS BY THE PHYSICIAN TODAY AND SIGNS OR SYMPTOMS TO REPORT TO CLINIC OR PRIMARY PHYSICIAN:   Restart Votrient per surgeon/oncologist @ [employer].  Return in 4-5 weeks to see [place].   Thank you for choosing [facility] to provide your oncology and hematology care.  To afford each patient quality time with our providers, please arrive at least 15 minutes before your scheduled appointment time.  With your help, our goal is to use those 15 minutes to complete the necessary work-up to ensure our physicians have the information they need to help with your evaluation and healthcare recommendations.    Effective January 1st, 2014, we ask that you re-schedule your appointment with our physicians should you arrive 10 or more minutes late for your appointment.  We strive to give you quality time with our providers, and arriving late affects you and other patients whose appointments are after yours.    Again, thank you for choosing [facility].  Our hope is that these requests will decrease the amount of time that you wait before being seen by our physicians.       _____________________________________________________________  Should you have questions after your visit to [facility], please contact our office at [phone] between the hours of 8:30 a.m. and 5:00 p.m.  Voicemails left after 4:30 p.m. will not be returned until the following business day.  For prescription refill requests, have your pharmacy contact our office with your prescription refill request.

## 2012-09-09 HISTORY — PX: TOTAL NEPHRECTOMY: SHX415

## 2012-09-09 HISTORY — PX: VENTRAL HERNIA REPAIR: SHX424

## 2012-10-03 DIAGNOSIS — C7800 Secondary malignant neoplasm of unspecified lung: Secondary | ICD-10-CM

## 2012-10-03 DIAGNOSIS — IMO0001 Reserved for inherently not codable concepts without codable children: Secondary | ICD-10-CM

## 2012-10-03 DIAGNOSIS — M549 Dorsalgia, unspecified: Secondary | ICD-10-CM

## 2012-10-03 DIAGNOSIS — Z483 Aftercare following surgery for neoplasm: Secondary | ICD-10-CM
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
## 2012-10-12 ENCOUNTER — Encounter (HOSPITAL_COMMUNITY): Payer: BC Managed Care – PPO | Attending: Hematology and Oncology | Admitting: Oncology

## 2012-10-12 ENCOUNTER — Encounter (HOSPITAL_COMMUNITY): Payer: Self-pay | Admitting: Oncology

## 2012-10-12 VITALS — BP 96/62 | HR 77 | Temp 98.2°F | Resp 18 | Wt 193.0 lb

## 2012-10-12 DIAGNOSIS — M545 Low back pain, unspecified: Secondary | ICD-10-CM

## 2012-10-12 DIAGNOSIS — E119 Type 2 diabetes mellitus without complications: Secondary | ICD-10-CM

## 2012-10-12 DIAGNOSIS — Z23 Encounter for immunization: Secondary | ICD-10-CM

## 2012-10-12 DIAGNOSIS — J449 Chronic obstructive pulmonary disease, unspecified: Secondary | ICD-10-CM

## 2012-10-12 DIAGNOSIS — C649 Malignant neoplasm of unspecified kidney, except renal pelvis: Secondary | ICD-10-CM

## 2012-10-12 DIAGNOSIS — M1990 Unspecified osteoarthritis, unspecified site: Secondary | ICD-10-CM

## 2012-10-12 DIAGNOSIS — C801 Malignant (primary) neoplasm, unspecified: Secondary | ICD-10-CM | POA: Insufficient documentation

## 2012-10-12 DIAGNOSIS — Z0000 Encounter for general adult medical examination without abnormal findings: Secondary | ICD-10-CM

## 2012-10-12 LAB — CBC WITH DIFFERENTIAL/PLATELET
Basophils Absolute: 0 10*3/uL (ref 0.0–0.1)
Basophils Relative: 0 % (ref 0–1)
Hemoglobin: 10.4 g/dL — ABNORMAL LOW (ref 12.0–15.0)
Lymphocytes Relative: 26 % (ref 12–46)
MCH: 31 pg (ref 26.0–34.0)
MCHC: 32.2 g/dL (ref 30.0–36.0)
MCV: 96.1 fL (ref 78.0–100.0)
Neutro Abs: 3.6 10*3/uL (ref 1.7–7.7)
Neutrophils Relative %: 63 % (ref 43–77)
Platelets: 229 10*3/uL (ref 150–400)
RDW: 16.1 % — ABNORMAL HIGH (ref 11.5–15.5)
WBC: 5.8 10*3/uL (ref 4.0–10.5)

## 2012-10-12 LAB — COMPREHENSIVE METABOLIC PANEL
ALT: 10 U/L (ref 0–35)
AST: 20 U/L (ref 0–37)
Albumin: 3.6 g/dL (ref 3.5–5.2)
CO2: 28 mEq/L (ref 19–32)
Calcium: 10.7 mg/dL — ABNORMAL HIGH (ref 8.4–10.5)
Chloride: 101 mEq/L (ref 96–112)
GFR calc non Af Amer: 37 mL/min — ABNORMAL LOW (ref >90)
Potassium: 4 mEq/L (ref 3.5–5.1)
Sodium: 139 mEq/L (ref 135–145)
Total Bilirubin: 0.4 mg/dL (ref 0.3–1.2)

## 2012-10-12 MED ORDER — INFLUENZA VAC SPLIT QUAD 0.5 ML IM SUSP
0.5000 mL | Freq: Once | INTRAMUSCULAR | Status: AC
  Administered 2012-10-12: 0.5 mL via INTRAMUSCULAR
  Filled 2012-10-12: qty 0.5

## 2012-10-12 MED ORDER — DICLOFENAC SODIUM 1 % TD GEL: 2.0000 g | Freq: Four times a day (QID) | TRANSDERMAL | Status: DC | PRN

## 2012-10-12 NOTE — Patient Instructions (Addendum)
.  [facility] Discharge Instructions  RECOMMENDATIONS MADE BY THE CONSULTANT AND ANY TEST RESULTS WILL BE SENT TO YOUR REFERRING PHYSICIAN.  EXAM FINDINGS BY THE PHYSICIAN TODAY AND SIGNS OR SYMPTOMS TO REPORT TO CLINIC OR PRIMARY PHYSICIAN: Exam and findings as discussed by Jenita Seashore PA. Flu shot today voltaren gel prescription has been sent to your drugstore Hold voltrient til you see docs at [facility] INSTRUCTIONS/FOLLOW-UP: 6 weeks  Thank you for choosing [facility] to provide your oncology and hematology care.  To afford each patient quality time with our providers, please arrive at least 15 minutes before your scheduled appointment time.  With your help, our goal is to use those 15 minutes to complete the necessary work-up to ensure our physicians have the information they need to help with your evaluation and healthcare recommendations.    Effective January 1st, 2014, we ask that you re-schedule your appointment with our physicians should you arrive 10 or more minutes late for your appointment.  We strive to give you quality time with our providers, and arriving late affects you and other patients whose appointments are after yours.    Again, thank you for choosing [facility].  Our hope is that these requests will decrease the amount of time that you wait before being seen by our physicians.       _____________________________________________________________  Should you have questions after your visit to [facility], please contact our office at [phone] between the hours of 8:30 a.m. and 5:00 p.m.  Voicemails left after 4:30 p.m. will not be returned until the following business day.  For prescription refill requests, have your pharmacy contact our office with your prescription refill request.

## 2012-10-12 NOTE — Progress Notes (Signed)
[name] [name] presents today for injection per MD orders. Flu shot administered IM in lt deltoid.  Administration without incident. Patient tolerated well.  [name] [name] presented for labwork. Labs per MD order drawn via Peripheral Line 25 gauge needle inserted in rt ac.  Good blood return present. Procedure without incident.  Needle removed intact. Patient tolerated procedure well.

## 2012-10-26 ENCOUNTER — Other Ambulatory Visit (HOSPITAL_COMMUNITY): Payer: Self-pay | Admitting: Oncology

## 2012-10-31 ENCOUNTER — Telehealth (HOSPITAL_COMMUNITY): Payer: Self-pay | Admitting: Oncology

## 2012-10-31 NOTE — Telephone Encounter (Signed)
Samuella Bruin, PA-C,

## 2012-10-31 NOTE — Telephone Encounter (Addendum)
Per T. Kefalas, PA-C, Ilka J Shifflet's Voltaren prescription is not covered by insurance and EMER ONNEN needs to follow-up with her family physician.  Sharlyne Pacas contacted and advised of same - verbalized understanding for needed follow-up.

## 2012-11-07 ENCOUNTER — Other Ambulatory Visit (HOSPITAL_COMMUNITY): Payer: Self-pay | Admitting: Oncology

## 2012-11-11 ENCOUNTER — Other Ambulatory Visit (HOSPITAL_COMMUNITY): Payer: Self-pay | Admitting: Oncology

## 2012-11-11 DIAGNOSIS — C641 Malignant neoplasm of right kidney, except renal pelvis: Secondary | ICD-10-CM

## 2012-11-11 MED ORDER — RANITIDINE HCL 150 MG PO TABS: 150.0000 mg | ORAL_TABLET | Freq: Two times a day (BID) | ORAL | Status: DC

## 2012-11-14 ENCOUNTER — Other Ambulatory Visit (HOSPITAL_COMMUNITY): Payer: BC Managed Care – PPO

## 2012-11-14 ENCOUNTER — Other Ambulatory Visit: Payer: Self-pay

## 2012-11-14 ENCOUNTER — Other Ambulatory Visit (HOSPITAL_COMMUNITY): Payer: Medicaid Other

## 2012-11-14 ENCOUNTER — Telehealth: Payer: Self-pay | Admitting: Family Medicine

## 2012-11-14 DIAGNOSIS — Z1231 Encounter for screening mammogram for malignant neoplasm of breast: Secondary | ICD-10-CM

## 2012-11-14 DIAGNOSIS — G4700 Insomnia, unspecified: Secondary | ICD-10-CM

## 2012-11-14 NOTE — Telephone Encounter (Signed)
dismissed patient refill denied

## 2012-11-16 ENCOUNTER — Ambulatory Visit (HOSPITAL_COMMUNITY): Payer: BC Managed Care – PPO | Admitting: Oncology

## 2012-11-16 ENCOUNTER — Other Ambulatory Visit (HOSPITAL_COMMUNITY): Payer: Self-pay | Admitting: Oncology

## 2012-11-24 ENCOUNTER — Encounter (HOSPITAL_COMMUNITY): Payer: BC Managed Care – PPO | Attending: Hematology and Oncology

## 2012-11-24 VITALS — BP 123/75 | HR 69 | Temp 97.6°F | Resp 20 | Wt 194.2 lb

## 2012-11-24 DIAGNOSIS — C801 Malignant (primary) neoplasm, unspecified: Secondary | ICD-10-CM

## 2012-11-24 DIAGNOSIS — C649 Malignant neoplasm of unspecified kidney, except renal pelvis: Secondary | ICD-10-CM

## 2012-11-24 DIAGNOSIS — C641 Malignant neoplasm of right kidney, except renal pelvis: Secondary | ICD-10-CM

## 2012-11-24 LAB — CBC WITH DIFFERENTIAL/PLATELET
Basophils Absolute: 0 10*3/uL (ref 0.0–0.1)
Basophils Relative: 0 % (ref 0–1)
Eosinophils Absolute: 0.4 10*3/uL (ref 0.0–0.7)
Hemoglobin: 11.2 g/dL — ABNORMAL LOW (ref 12.0–15.0)
MCH: 30.1 pg (ref 26.0–34.0)
MCHC: 33 g/dL (ref 30.0–36.0)
MCV: 91.1 fL (ref 78.0–100.0)
Monocytes Relative: 5 % (ref 3–12)
Neutro Abs: 7.3 10*3/uL (ref 1.7–7.7)
Neutrophils Relative %: 70 % (ref 43–77)
RDW: 14.3 % (ref 11.5–15.5)

## 2012-11-24 LAB — COMPREHENSIVE METABOLIC PANEL
ALT: 12 U/L (ref 0–35)
AST: 20 U/L (ref 0–37)
Albumin: 3.9 g/dL (ref 3.5–5.2)
Alkaline Phosphatase: 85 U/L (ref 39–117)
BUN: 24 mg/dL — ABNORMAL HIGH (ref 6–23)
Creatinine, Ser: 1.34 mg/dL — ABNORMAL HIGH (ref 0.50–1.10)
Potassium: 4.6 mEq/L (ref 3.5–5.1)
Total Bilirubin: 0.3 mg/dL (ref 0.3–1.2)
Total Protein: 8.4 g/dL — ABNORMAL HIGH (ref 6.0–8.3)

## 2012-11-24 NOTE — Patient Instructions (Addendum)
[facility] Discharge Instructions  RECOMMENDATIONS MADE BY THE CONSULTANT AND ANY TEST RESULTS WILL BE SENT TO YOUR REFERRING PHYSICIAN.  EXAM FINDINGS BY THE PHYSICIAN TODAY AND SIGNS OR SYMPTOMS TO REPORT TO CLINIC OR PRIMARY PHYSICIAN:  Appointment with Diannia Ruder psychiatrist @ [facility] in [place] on 12/05/12 @ 1:00.  Address: [address] [place]   [phone]  [facility] will be mailing you a packet of information to fill out. Make sure you fill out this paperwork.    Return to see Dr. Zigmund Daniel here @ Cancer Center in 1 month: December 22, 2012 @ 10:30  Thank you for choosing [facility] to provide your oncology and hematology care.  To afford each patient quality time with our providers, please arrive at least 15 minutes before your scheduled appointment time.  With your help, our goal is to use those 15 minutes to complete the necessary work-up to ensure our physicians have the information they need to help with your evaluation and healthcare recommendations.    Effective January 1st, 2014, we ask that you re-schedule your appointment with our physicians should you arrive 10 or more minutes late for your appointment.  We strive to give you quality time with our providers, and arriving late affects you and other patients whose appointments are after yours.    Again, thank you for choosing [facility].  Our hope is that these requests will decrease the amount of time that you wait before being seen by our physicians.       _____________________________________________________________  Should you have questions after your visit to [facility], please contact our office at [phone] between the hours of 8:30 a.m. and 5:00 p.m.  Voicemails left after 4:30 p.m. will not be returned until the following business day.  For prescription refill requests, have your pharmacy  contact our office with your prescription refill request.
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
## 2012-12-05 ENCOUNTER — Ambulatory Visit (INDEPENDENT_AMBULATORY_CARE_PROVIDER_SITE_OTHER): Payer: BC Managed Care – PPO | Admitting: Psychiatry

## 2012-12-05 ENCOUNTER — Other Ambulatory Visit (HOSPITAL_COMMUNITY): Payer: Self-pay | Admitting: Oncology

## 2012-12-05 ENCOUNTER — Encounter (HOSPITAL_COMMUNITY): Payer: Self-pay | Admitting: Psychiatry

## 2012-12-05 VITALS — BP 160/80 | Ht 65.0 in | Wt 193.0 lb

## 2012-12-05 DIAGNOSIS — F418 Other specified anxiety disorders: Secondary | ICD-10-CM

## 2012-12-05 DIAGNOSIS — F329 Major depressive disorder, single episode, unspecified: Secondary | ICD-10-CM

## 2012-12-05 DIAGNOSIS — N2889 Other specified disorders of kidney and ureter: Secondary | ICD-10-CM

## 2012-12-05 DIAGNOSIS — C641 Malignant neoplasm of right kidney, except renal pelvis: Secondary | ICD-10-CM

## 2012-12-05 MED ORDER — PAROXETINE HCL 20 MG PO TABS: 20.0000 mg | ORAL_TABLET | Freq: Two times a day (BID) | ORAL | Status: DC

## 2012-12-05 MED ORDER — PAZOPANIB HCL 200 MG PO TABS: 800.0000 mg | ORAL_TABLET | Freq: Every day | ORAL | Status: DC

## 2012-12-05 MED ORDER — BUPROPION HCL 75 MG PO TABS: 75.0000 mg | ORAL_TABLET | ORAL | Status: DC

## 2012-12-05 MED ORDER — ZOLPIDEM TARTRATE 5 MG PO TABS: 5.0000 mg | ORAL_TABLET | Freq: Every evening | ORAL | Status: DC | PRN
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
## 2012-12-14 ENCOUNTER — Ambulatory Visit
Admission: RE | Admit: 2012-12-14 | Discharge: 2012-12-14 | Disposition: A | Discharge status: Home / Self Care | Payer: BC Managed Care – PPO | Source: Ambulatory Visit

## 2012-12-14 DIAGNOSIS — Z1231 Encounter for screening mammogram for malignant neoplasm of breast: Secondary | ICD-10-CM

## 2012-12-22 ENCOUNTER — Encounter (HOSPITAL_COMMUNITY): Payer: Self-pay

## 2012-12-22 ENCOUNTER — Encounter (HOSPITAL_BASED_OUTPATIENT_CLINIC_OR_DEPARTMENT_OTHER): Payer: BC Managed Care – PPO

## 2012-12-22 ENCOUNTER — Encounter (HOSPITAL_COMMUNITY): Payer: BC Managed Care – PPO | Attending: Hematology and Oncology

## 2012-12-22 VITALS — BP 130/80 | HR 73 | Temp 97.3°F | Resp 18 | Wt 188.1 lb

## 2012-12-22 DIAGNOSIS — C50919 Malignant neoplasm of unspecified site of unspecified female breast: Secondary | ICD-10-CM

## 2012-12-22 DIAGNOSIS — R918 Other nonspecific abnormal finding of lung field: Secondary | ICD-10-CM

## 2012-12-22 DIAGNOSIS — C7800 Secondary malignant neoplasm of unspecified lung: Secondary | ICD-10-CM

## 2012-12-22 DIAGNOSIS — C649 Malignant neoplasm of unspecified kidney, except renal pelvis: Secondary | ICD-10-CM

## 2012-12-22 DIAGNOSIS — R188 Other ascites: Secondary | ICD-10-CM

## 2012-12-22 DIAGNOSIS — C641 Malignant neoplasm of right kidney, except renal pelvis: Secondary | ICD-10-CM

## 2012-12-22 LAB — CBC WITH DIFFERENTIAL/PLATELET
Basophils Absolute: 0 10*3/uL (ref 0.0–0.1)
Basophils Relative: 0 % (ref 0–1)
Eosinophils Absolute: 0.4 10*3/uL (ref 0.0–0.7)
Eosinophils Relative: 4 % (ref 0–5)
HCT: 41.6 % (ref 36.0–46.0)
Hemoglobin: 14.2 g/dL (ref 12.0–15.0)
MCH: 30.1 pg (ref 26.0–34.0)
MCHC: 34.1 g/dL (ref 30.0–36.0)
MCV: 88.3 fL (ref 78.0–100.0)
Monocytes Absolute: 0.3 10*3/uL (ref 0.1–1.0)
Monocytes Relative: 3 % (ref 3–12)
RDW: 14.8 % (ref 11.5–15.5)

## 2012-12-22 LAB — COMPREHENSIVE METABOLIC PANEL
AST: 17 U/L (ref 0–37)
Albumin: 4.1 g/dL (ref 3.5–5.2)
Alkaline Phosphatase: 86 U/L (ref 39–117)
BUN: 27 mg/dL — ABNORMAL HIGH (ref 6–23)
Calcium: 10.6 mg/dL — ABNORMAL HIGH (ref 8.4–10.5)
Creatinine, Ser: 1.6 mg/dL — ABNORMAL HIGH (ref 0.50–1.10)
Total Protein: 8.5 g/dL — ABNORMAL HIGH (ref 6.0–8.3)

## 2012-12-22 NOTE — Patient Instructions (Signed)
[facility] Discharge Instructions  RECOMMENDATIONS MADE BY THE CONSULTANT AND ANY TEST RESULTS WILL BE SENT TO YOUR REFERRING PHYSICIAN.  EXAM FINDINGS BY THE PHYSICIAN TODAY AND SIGNS OR SYMPTOMS TO REPORT TO CLINIC OR PRIMARY PHYSICIAN: Exam and findings as discussed by Dr. Zigmund Daniel.  Recommend that you call [facility] to be seen sooner to get that area drained.  Will check some blood work today and if there are any problems we will call you.  MEDICATIONS PRESCRIBED:  none  INSTRUCTIONS/FOLLOW-UP: Follow-up in 1 month with blood work and MD visit.  Thank you for choosing [facility] to provide your oncology and hematology care.  To afford each patient quality time with our providers, please arrive at least 15 minutes before your scheduled appointment time.  With your help, our goal is to use those 15 minutes to complete the necessary work-up to ensure our physicians have the information they need to help with your evaluation and healthcare recommendations.    Effective January 1st, 2014, we ask that you re-schedule your appointment with our physicians should you arrive 10 or more minutes late for your appointment.  We strive to give you quality time with our providers, and arriving late affects you and other patients whose appointments are after yours.    Again, thank you for choosing [facility].  Our hope is that these requests will decrease the amount of time that you wait before being seen by our physicians.       _____________________________________________________________  Should you have questions after your visit to [facility] please contact our office at [phone] between the hours of 8:30 a.m. and 5:00 p.m.  Voicemails left after 4:30 p.m. will not be returned until the following business day.  For prescription refill requests, have your pharmacy contact our office with your prescription refill request.

## 2012-12-22 NOTE — Progress Notes (Signed)
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 35).  No pleural effusions are present. There is no pneumothorax.  ABDOMEN/PELVIS:  Atherosclerotic calcifications identified in the aorta and its branch vessels. The aorta and its branch vessels are patent. The portal vasculature appears patent.  Lymphadenopathy is noted, including:  -Periportal lymph nodes, measuring up to 1.1 cm, previously 3.8 cm, (series 2, image 27)  -Numerous round subcentimeter periaortic and aortocaval nodes, measuring up to 0.8 cm, unchanged (series 2, image 51)  -Round right external iliac node, 1.0 cm, previously 0.8 cm, (series 2, image 80).  The liver is unremarkable.  The gallbladder is surgically absent .  The low attenuation lesion in the inferior margin of the spleen is unchanged.  The left adrenal gland is unremarkable.  The pancreas is unremarkable.  Sequela of right nephrectomy is again noted. No abnormal soft tissue  is appreciated within the nephrectomy bed.  The left kidney is unremarkable.  The urinary bladder is mildly distended with no evidence of focal wall thickening.  There has been interval repair of previous right parasagittal upper abdominal ventral hernia. Towards the inferior aspect of the incision, a fluid collection is noted, measuring 1.4 x 8.7 cm (series 2, image 45). Numerous injection granulomas are noted  in the left lower quadrant.  Oral contrast is seen throughout the majority of small bowel and colon. There is no evidence of bowel wall thickening or obstruction. No pericolonic areas of inflammation are identified.  There is no evidence of free air or free fluid.  The pelvic structures are unremarkable. Uterus and ovaries are absent.  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  INTERPRETATION LOCATION: [facility]  IMPRESSION:  Progression of disease, as evidenced by:  -Marked interval enlargement of mediastinal and hilar lymphadenopathy.  -Interval increase in size and number of pulmonary metastases.  -Mild interval enlargement of periportal . Numerous subcentimeter periaortic/aortocaval nodes demonstrate abnormal morphology.  -Mild interval enlargement of left thyroid lobe heterogeneous lesion, nonspecific.  -Abdominal wall Fluid collection in midline surgical incision which could represent  postoperative seroma/hematoma.     Mm Digital Screening  12/14/2012   CLINICAL DATA:  Screening.  EXAM: DIGITAL SCREENING BILATERAL MAMMOGRAM WITH CAD  COMPARISON:  Previous exam(s).  ACR Breast Density Category b: There are scattered areas of fibroglandular density.  FINDINGS: There are no findings suspicious for malignancy. Images were processed with CAD.  IMPRESSION: No mammographic evidence of malignancy. A result letter of this screening mammogram will be mailed directly to the patient.  RECOMMENDATION: Screening  mammogram in one year. (Code:SM-B-01Y)  BI-RADS CATEGORY  1: Negative   Electronically Signed   By: Baird Lyons M.D.   On: 12/14/2012 15:02    ASSESSMENT:  #1. 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 #2. Increasing ascites. #3. Hypertension, controlled. #4. Diabetes mellitus, type II, non-insulin requiring.Marland Kitchen   PLAN:  #1. Recommend paracentesis for diagnosis as well as possible long-term implantation for periodic draining to control symptoms. #2. Continue Votrient 800 mg daily and follow lab work. #3. Followup here in one month.   All questions were answered. The patient knows to call the clinic with any problems, questions or concerns. We can certainly see the patient much sooner if necessary.   I spent 25 minutes counseling the patient face to face. The total time spent in the appointment was 30 minutes.    Maurilio Lovely, MD

## 2012-12-22 NOTE — Progress Notes (Signed)
Labs drawn today for cbc/diff,ldh,cmp 

## 2013-01-09 ENCOUNTER — Encounter (HOSPITAL_COMMUNITY): Payer: Self-pay | Admitting: Psychiatry

## 2013-01-09 ENCOUNTER — Ambulatory Visit (INDEPENDENT_AMBULATORY_CARE_PROVIDER_SITE_OTHER): Payer: BC Managed Care – PPO | Admitting: Psychiatry

## 2013-01-09 VITALS — BP 150/90 | Ht 65.0 in | Wt 190.0 lb

## 2013-01-09 DIAGNOSIS — F329 Major depressive disorder, single episode, unspecified: Secondary | ICD-10-CM

## 2013-01-09 DIAGNOSIS — F418 Other specified anxiety disorders: Secondary | ICD-10-CM

## 2013-01-09 MED ORDER — BUPROPION HCL ER (XL) 150 MG PO TB24: 150.0000 mg | ORAL_TABLET | ORAL | Status: DC
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
## 2013-01-10 ENCOUNTER — Other Ambulatory Visit (HOSPITAL_COMMUNITY): Payer: Self-pay | Admitting: Oncology

## 2013-01-10 DIAGNOSIS — C649 Malignant neoplasm of unspecified kidney, except renal pelvis: Secondary | ICD-10-CM

## 2013-01-10 DIAGNOSIS — C641 Malignant neoplasm of right kidney, except renal pelvis: Secondary | ICD-10-CM

## 2013-01-10 MED ORDER — PAZOPANIB HCL 200 MG PO TABS: 800.0000 mg | ORAL_TABLET | Freq: Every day | ORAL | Status: DC

## 2013-01-16 ENCOUNTER — Ambulatory Visit (INDEPENDENT_AMBULATORY_CARE_PROVIDER_SITE_OTHER): Payer: BC Managed Care – PPO | Admitting: Psychiatry

## 2013-01-16 DIAGNOSIS — F329 Major depressive disorder, single episode, unspecified: Secondary | ICD-10-CM

## 2013-01-16 DIAGNOSIS — F3289 Other specified depressive episodes: Secondary | ICD-10-CM
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
## 2013-01-16 NOTE — Patient Instructions (Signed)
Discussed orally 

## 2013-01-19 ENCOUNTER — Encounter (HOSPITAL_COMMUNITY): Payer: BC Managed Care – PPO | Attending: Hematology and Oncology

## 2013-01-19 ENCOUNTER — Encounter (HOSPITAL_COMMUNITY): Payer: Self-pay

## 2013-01-19 ENCOUNTER — Encounter (HOSPITAL_BASED_OUTPATIENT_CLINIC_OR_DEPARTMENT_OTHER): Payer: BC Managed Care – PPO

## 2013-01-19 VITALS — BP 156/92 | HR 54 | Temp 97.6°F | Resp 20 | Wt 191.0 lb

## 2013-01-19 DIAGNOSIS — C649 Malignant neoplasm of unspecified kidney, except renal pelvis: Secondary | ICD-10-CM | POA: Insufficient documentation

## 2013-01-19 DIAGNOSIS — C641 Malignant neoplasm of right kidney, except renal pelvis: Secondary | ICD-10-CM

## 2013-01-19 DIAGNOSIS — C799 Secondary malignant neoplasm of unspecified site: Secondary | ICD-10-CM

## 2013-01-19 DIAGNOSIS — M79609 Pain in unspecified limb: Secondary | ICD-10-CM

## 2013-01-19 DIAGNOSIS — R110 Nausea: Secondary | ICD-10-CM

## 2013-01-19 DIAGNOSIS — R918 Other nonspecific abnormal finding of lung field: Secondary | ICD-10-CM

## 2013-01-19 DIAGNOSIS — C7800 Secondary malignant neoplasm of unspecified lung: Secondary | ICD-10-CM

## 2013-01-19 DIAGNOSIS — R188 Other ascites: Secondary | ICD-10-CM

## 2013-01-19 LAB — COMPREHENSIVE METABOLIC PANEL
ALBUMIN: 3.8 g/dL (ref 3.5–5.2)
ALT: 37 U/L — ABNORMAL HIGH (ref 0–35)
AST: 38 U/L — AB (ref 0–37)
Alkaline Phosphatase: 81 U/L (ref 39–117)
BILIRUBIN TOTAL: 0.5 mg/dL (ref 0.3–1.2)
BUN: 26 mg/dL — AB (ref 6–23)
CHLORIDE: 102 meq/L (ref 96–112)
CO2: 23 meq/L (ref 19–32)
Calcium: 10.2 mg/dL (ref 8.4–10.5)
Creatinine, Ser: 1.34 mg/dL — ABNORMAL HIGH (ref 0.50–1.10)
GFR calc Af Amer: 48 mL/min — ABNORMAL LOW (ref >90)
GFR, EST NON AFRICAN AMERICAN: 41 mL/min — AB (ref >90)
Glucose, Bld: 108 mg/dL — ABNORMAL HIGH (ref 70–99)
POTASSIUM: 4.9 meq/L (ref 3.7–5.3)
Sodium: 140 mEq/L (ref 137–147)
Total Protein: 8.1 g/dL (ref 6.0–8.3)

## 2013-01-19 LAB — CBC WITH DIFFERENTIAL/PLATELET
Basophils Absolute: 0 10*3/uL (ref 0.0–0.1)
Basophils Relative: 0 % (ref 0–1)
Eosinophils Absolute: 0.6 10*3/uL (ref 0.0–0.7)
Eosinophils Relative: 8 % — ABNORMAL HIGH (ref 0–5)
HEMATOCRIT: 41.5 % (ref 36.0–46.0)
HEMOGLOBIN: 14 g/dL (ref 12.0–15.0)
Lymphocytes Relative: 29 % (ref 12–46)
Lymphs Abs: 2.2 10*3/uL (ref 0.7–4.0)
MCH: 30.2 pg (ref 26.0–34.0)
MCHC: 33.7 g/dL (ref 30.0–36.0)
MCV: 89.6 fL (ref 78.0–100.0)
MONO ABS: 0.3 10*3/uL (ref 0.1–1.0)
MONOS PCT: 4 % (ref 3–12)
Neutro Abs: 4.5 10*3/uL (ref 1.7–7.7)
Neutrophils Relative %: 59 % (ref 43–77)
Platelets: 241 10*3/uL (ref 150–400)
RBC: 4.63 MIL/uL (ref 3.87–5.11)
RDW: 17.2 % — ABNORMAL HIGH (ref 11.5–15.5)
WBC: 7.7 10*3/uL (ref 4.0–10.5)

## 2013-01-19 LAB — LACTATE DEHYDROGENASE: LDH: 145 U/L (ref 94–250)

## 2013-01-19 MED ORDER — METOCLOPRAMIDE HCL 5 MG PO TABS: ORAL_TABLET | ORAL | Status: DC

## 2013-01-19 NOTE — Progress Notes (Signed)
Labs drawn today for cbc/diff,cmp,ldh

## 2013-01-19 NOTE — Patient Instructions (Signed)
.  [facility] Discharge Instructions  RECOMMENDATIONS MADE BY THE CONSULTANT AND ANY TEST RESULTS WILL BE SENT TO YOUR REFERRING PHYSICIAN.  EXAM FINDINGS BY THE PHYSICIAN TODAY AND SIGNS OR SYMPTOMS TO REPORT TO CLINIC OR PRIMARY PHYSICIAN: Exam and findings as discussed by Dr. Barnet Glasgow. He will talk to you orthopaedic dr to let him know it's ok to do steroid injections and toe surgery MEDICATIONS PRESCRIBED:  Continue votrient  INSTRUCTIONS/FOLLOW-UP: 2 months with labs  Thank you for choosing [facility] to provide your oncology and hematology care.  To afford each patient quality time with our providers, please arrive at least 15 minutes before your scheduled appointment time.  With your help, our goal is to use those 15 minutes to complete the necessary work-up to ensure our physicians have the information they need to help with your evaluation and healthcare recommendations.    Effective January 1st, 2014, we ask that you re-schedule your appointment with our physicians should you arrive 10 or more minutes late for your appointment.  We strive to give you quality time with our providers, and arriving late affects you and other patients whose appointments are after yours.    Again, thank you for choosing [facility].  Our hope is that these requests will decrease the amount of time that you wait before being seen by our physicians.       _____________________________________________________________  Should you have questions after your visit to [facility], please contact our office at [phone] between the hours of 8:30 a.m. and 5:00 p.m.  Voicemails left after 4:30 p.m. will not be returned until the following business day.  For prescription refill requests, have your pharmacy contact our office with your prescription refill request.
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
## 2013-02-02 ENCOUNTER — Ambulatory Visit (INDEPENDENT_AMBULATORY_CARE_PROVIDER_SITE_OTHER): Payer: BC Managed Care – PPO | Admitting: Psychiatry

## 2013-02-02 DIAGNOSIS — F3289 Other specified depressive episodes: Secondary | ICD-10-CM

## 2013-02-02 DIAGNOSIS — F329 Major depressive disorder, single episode, unspecified: Secondary | ICD-10-CM
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
## 2013-02-02 NOTE — Patient Instructions (Signed)
Discussed orally 

## 2013-02-06 ENCOUNTER — Encounter (HOSPITAL_COMMUNITY): Payer: Self-pay | Admitting: Psychiatry

## 2013-02-06 ENCOUNTER — Ambulatory Visit (INDEPENDENT_AMBULATORY_CARE_PROVIDER_SITE_OTHER): Payer: BC Managed Care – PPO | Admitting: Psychiatry

## 2013-02-06 VITALS — BP 160/82 | Ht 65.0 in | Wt 193.0 lb

## 2013-02-06 DIAGNOSIS — N2889 Other specified disorders of kidney and ureter: Secondary | ICD-10-CM

## 2013-02-06 DIAGNOSIS — F3289 Other specified depressive episodes: Secondary | ICD-10-CM

## 2013-02-06 DIAGNOSIS — F329 Major depressive disorder, single episode, unspecified: Secondary | ICD-10-CM

## 2013-02-06 MED ORDER — ZOLPIDEM TARTRATE 5 MG PO TABS: 5.0000 mg | ORAL_TABLET | Freq: Every evening | ORAL | Status: DC | PRN

## 2013-02-06 MED ORDER — METHYLPHENIDATE HCL 10 MG PO TABS: 10.0000 mg | ORAL_TABLET | Freq: Two times a day (BID) | ORAL | Status: DC

## 2013-02-06 MED ORDER — PAROXETINE HCL 20 MG PO TABS: 20.0000 mg | ORAL_TABLET | Freq: Two times a day (BID) | ORAL | Status: DC
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
## 2013-02-07 ENCOUNTER — Telehealth (HOSPITAL_COMMUNITY): Payer: Self-pay

## 2013-02-07 ENCOUNTER — Other Ambulatory Visit (HOSPITAL_COMMUNITY): Payer: Self-pay | Admitting: Hematology and Oncology

## 2013-02-07 MED ORDER — PAZOPANIB HCL 200 MG PO TABS
800.0000 mg | ORAL_TABLET | Freq: Every day | ORAL | Status: DC
Start: 2013-02-07 — End: 2013-04-04

## 2013-02-07 NOTE — Telephone Encounter (Signed)
Refill for Votrient requested from [facility] - [phone] option 5.

## 2013-02-23 ENCOUNTER — Ambulatory Visit (INDEPENDENT_AMBULATORY_CARE_PROVIDER_SITE_OTHER): Payer: BC Managed Care – PPO | Admitting: Psychiatry

## 2013-02-23 DIAGNOSIS — F329 Major depressive disorder, single episode, unspecified: Secondary | ICD-10-CM

## 2013-02-23 DIAGNOSIS — F3289 Other specified depressive episodes: Secondary | ICD-10-CM
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
## 2013-02-23 NOTE — Patient Instructions (Signed)
Discussed orally 

## 2013-02-28 ENCOUNTER — Other Ambulatory Visit (HOSPITAL_COMMUNITY): Payer: Self-pay | Admitting: Oncology

## 2013-02-28 DIAGNOSIS — K219 Gastro-esophageal reflux disease without esophagitis: Secondary | ICD-10-CM

## 2013-02-28 MED ORDER — RANITIDINE HCL 150 MG PO TABS: 150.0000 mg | ORAL_TABLET | Freq: Two times a day (BID) | ORAL | Status: DC

## 2013-03-02 ENCOUNTER — Ambulatory Visit (HOSPITAL_BASED_OUTPATIENT_CLINIC_OR_DEPARTMENT_OTHER): Admit: 2013-03-02 | Payer: BC Managed Care – PPO | Admitting: Orthopedic Surgery

## 2013-03-02 ENCOUNTER — Encounter (HOSPITAL_BASED_OUTPATIENT_CLINIC_OR_DEPARTMENT_OTHER): Payer: Self-pay

## 2013-03-02 SURGERY — SHOULDER ARTHROSCOPY WITH SUBACROMIAL DECOMPRESSION
Anesthesia: General | Laterality: Right

## 2013-03-02 SURGERY — SHOULDER ARTHROSCOPY WITH ROTATOR CUFF REPAIR AND SUBACROMIAL DECOMPRESSION
Anesthesia: General | Site: Shoulder | Laterality: Right

## 2013-03-03 ENCOUNTER — Ambulatory Visit (INDEPENDENT_AMBULATORY_CARE_PROVIDER_SITE_OTHER): Payer: BC Managed Care – PPO | Admitting: Psychiatry

## 2013-03-03 ENCOUNTER — Encounter (HOSPITAL_COMMUNITY): Payer: Self-pay | Admitting: Psychiatry

## 2013-03-03 ENCOUNTER — Telehealth (HOSPITAL_COMMUNITY): Payer: Self-pay | Admitting: Oncology

## 2013-03-03 VITALS — BP 120/80 | Ht 65.0 in | Wt 195.0 lb

## 2013-03-03 DIAGNOSIS — F3289 Other specified depressive episodes: Secondary | ICD-10-CM

## 2013-03-03 DIAGNOSIS — F329 Major depressive disorder, single episode, unspecified: Secondary | ICD-10-CM

## 2013-03-03 MED ORDER — ZOLPIDEM TARTRATE 5 MG PO TABS: 5.0000 mg | ORAL_TABLET | Freq: Every evening | ORAL | Status: DC | PRN

## 2013-03-03 MED ORDER — METHYLPHENIDATE HCL 10 MG PO TABS: 10.0000 mg | ORAL_TABLET | Freq: Two times a day (BID) | ORAL | Status: DC
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
## 2013-03-03 NOTE — Telephone Encounter (Signed)
CO PAY ASSIST THUR GLAXOSMITHKLINE EXPS 03/02/14 BO#[id number] GRP [id number] BIN [id number] PCN LOYALTY ISSUER [facility] [phone]

## 2013-03-06 ENCOUNTER — Ambulatory Visit (HOSPITAL_COMMUNITY): Payer: Self-pay | Admitting: Psychiatry

## 2013-03-06 HISTORY — PX: SHOULDER ARTHROSCOPY W/ ROTATOR CUFF REPAIR: SHX2400

## 2013-03-09 ENCOUNTER — Ambulatory Visit (HOSPITAL_COMMUNITY): Payer: Self-pay | Admitting: Psychiatry

## 2013-03-14 ENCOUNTER — Other Ambulatory Visit: Payer: Self-pay | Admitting: Physician Assistant

## 2013-03-14 ENCOUNTER — Encounter: Payer: Self-pay | Admitting: *Deleted

## 2013-03-14 NOTE — Telephone Encounter (Signed)
Requires office visit before any refills can be given.

## 2013-03-16 ENCOUNTER — Other Ambulatory Visit (HOSPITAL_COMMUNITY): Payer: Self-pay | Admitting: Psychiatry

## 2013-03-16 ENCOUNTER — Telehealth (HOSPITAL_COMMUNITY): Payer: Self-pay | Admitting: *Deleted

## 2013-03-16 DIAGNOSIS — N2889 Other specified disorders of kidney and ureter: Secondary | ICD-10-CM

## 2013-03-16 MED ORDER — PAROXETINE HCL 20 MG PO TABS: 20.0000 mg | ORAL_TABLET | Freq: Two times a day (BID) | ORAL | Status: DC

## 2013-03-16 MED ORDER — METHYLPHENIDATE HCL 10 MG PO TABS: 10.0000 mg | ORAL_TABLET | Freq: Two times a day (BID) | ORAL | Status: DC

## 2013-03-16 MED ORDER — METHYLPHENIDATE HCL 10 MG PO TABS
10.0000 mg | ORAL_TABLET | Freq: Two times a day (BID) | ORAL | Status: DC
Start: 2013-03-16 — End: 2013-04-28

## 2013-03-16 NOTE — Telephone Encounter (Signed)
paxil sent to pharmacy, ritalin printed

## 2013-03-21 ENCOUNTER — Ambulatory Visit (HOSPITAL_COMMUNITY)
Admission: RE | Admit: 2013-03-21 | Discharge: 2013-03-21 | Disposition: A | Discharge status: Home / Self Care | Payer: Worker's Compensation | Source: Ambulatory Visit | Attending: Physician Assistant | Admitting: Physician Assistant

## 2013-03-21 DIAGNOSIS — E119 Type 2 diabetes mellitus without complications: Secondary | ICD-10-CM | POA: Diagnosis not present

## 2013-03-21 DIAGNOSIS — IMO0001 Reserved for inherently not codable concepts without codable children: Secondary | ICD-10-CM | POA: Insufficient documentation

## 2013-03-21 DIAGNOSIS — M25619 Stiffness of unspecified shoulder, not elsewhere classified: Secondary | ICD-10-CM | POA: Insufficient documentation

## 2013-03-21 DIAGNOSIS — M25519 Pain in unspecified shoulder: Secondary | ICD-10-CM | POA: Diagnosis not present

## 2013-03-21 DIAGNOSIS — M25611 Stiffness of right shoulder, not elsewhere classified: Secondary | ICD-10-CM

## 2013-03-21 DIAGNOSIS — I10 Essential (primary) hypertension: Secondary | ICD-10-CM | POA: Diagnosis not present

## 2013-03-21 DIAGNOSIS — M6281 Muscle weakness (generalized): Secondary | ICD-10-CM | POA: Diagnosis not present
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
## 2013-03-23 ENCOUNTER — Encounter (HOSPITAL_COMMUNITY): Payer: BC Managed Care – PPO | Attending: Hematology and Oncology

## 2013-03-23 ENCOUNTER — Encounter (HOSPITAL_BASED_OUTPATIENT_CLINIC_OR_DEPARTMENT_OTHER): Payer: BC Managed Care – PPO

## 2013-03-23 ENCOUNTER — Encounter (HOSPITAL_COMMUNITY): Payer: Self-pay

## 2013-03-23 VITALS — BP 132/83 | HR 76 | Temp 97.7°F | Resp 18 | Wt 192.1 lb

## 2013-03-23 DIAGNOSIS — R110 Nausea: Secondary | ICD-10-CM

## 2013-03-23 DIAGNOSIS — C7800 Secondary malignant neoplasm of unspecified lung: Secondary | ICD-10-CM

## 2013-03-23 DIAGNOSIS — I10 Essential (primary) hypertension: Secondary | ICD-10-CM

## 2013-03-23 DIAGNOSIS — R918 Other nonspecific abnormal finding of lung field: Secondary | ICD-10-CM

## 2013-03-23 DIAGNOSIS — C649 Malignant neoplasm of unspecified kidney, except renal pelvis: Secondary | ICD-10-CM

## 2013-03-23 LAB — COMPREHENSIVE METABOLIC PANEL
ALBUMIN: 3.8 g/dL (ref 3.5–5.2)
ALT: 14 U/L (ref 0–35)
AST: 20 U/L (ref 0–37)
Alkaline Phosphatase: 73 U/L (ref 39–117)
BUN: 30 mg/dL — AB (ref 6–23)
CO2: 18 mEq/L — ABNORMAL LOW (ref 19–32)
CREATININE: 1.47 mg/dL — AB (ref 0.50–1.10)
Calcium: 10.3 mg/dL (ref 8.4–10.5)
Chloride: 102 mEq/L (ref 96–112)
GFR calc non Af Amer: 37 mL/min — ABNORMAL LOW (ref >90)
GFR, EST AFRICAN AMERICAN: 43 mL/min — AB (ref >90)
GLUCOSE: 118 mg/dL — AB (ref 70–99)
Potassium: 4.5 mEq/L (ref 3.7–5.3)
Sodium: 139 mEq/L (ref 137–147)
TOTAL PROTEIN: 7.9 g/dL (ref 6.0–8.3)
Total Bilirubin: 0.5 mg/dL (ref 0.3–1.2)

## 2013-03-23 LAB — CBC WITH DIFFERENTIAL/PLATELET
BASOS PCT: 1 % (ref 0–1)
Basophils Absolute: 0 10*3/uL (ref 0.0–0.1)
EOS ABS: 0.3 10*3/uL (ref 0.0–0.7)
Eosinophils Relative: 3 % (ref 0–5)
HEMATOCRIT: 39.2 % (ref 36.0–46.0)
HEMOGLOBIN: 13.2 g/dL (ref 12.0–15.0)
LYMPHS ABS: 2 10*3/uL (ref 0.7–4.0)
Lymphocytes Relative: 24 % (ref 12–46)
MCH: 32.5 pg (ref 26.0–34.0)
MCHC: 33.7 g/dL (ref 30.0–36.0)
MCV: 96.6 fL (ref 78.0–100.0)
MONOS PCT: 3 % (ref 3–12)
Monocytes Absolute: 0.3 10*3/uL (ref 0.1–1.0)
Neutro Abs: 5.8 10*3/uL (ref 1.7–7.7)
Neutrophils Relative %: 69 % (ref 43–77)
Platelets: 224 10*3/uL (ref 150–400)
RBC: 4.06 MIL/uL (ref 3.87–5.11)
RDW: 15 % (ref 11.5–15.5)
WBC: 8.4 10*3/uL (ref 4.0–10.5)

## 2013-03-23 LAB — LACTATE DEHYDROGENASE: LDH: 124 U/L (ref 94–250)
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
## 2013-03-23 NOTE — Progress Notes (Signed)
Labs drawn today for cbc/diff,cmp,ldh

## 2013-03-23 NOTE — Patient Instructions (Signed)
[facility] Discharge Instructions  RECOMMENDATIONS MADE BY THE CONSULTANT AND ANY TEST RESULTS WILL BE SENT TO YOUR REFERRING PHYSICIAN.  EXAM FINDINGS BY THE PHYSICIAN TODAY AND SIGNS OR SYMPTOMS TO REPORT TO CLINIC OR PRIMARY PHYSICIAN: Exam and findings as discussed by Dr. Barnet Glasgow.  Report uncontrolled pain or nausea.  MEDICATIONS PRESCRIBED:  Metoclopropramide (Reglan)  Take 1 or 2 before each meal and at bedtime.  INSTRUCTIONS/FOLLOW-UP: Follow-up with labs and office visit in 3 months.  Thank you for choosing [facility] to provide your oncology and hematology care.  To afford each patient quality time with our providers, please arrive at least 15 minutes before your scheduled appointment time.  With your help, our goal is to use those 15 minutes to complete the necessary work-up to ensure our physicians have the information they need to help with your evaluation and healthcare recommendations.    Effective January 1st, 2014, we ask that you re-schedule your appointment with our physicians should you arrive 10 or more minutes late for your appointment.  We strive to give you quality time with our providers, and arriving late affects you and other patients whose appointments are after yours.    Again, thank you for choosing [facility].  Our hope is that these requests will decrease the amount of time that you wait before being seen by our physicians.       _____________________________________________________________  Should you have questions after your visit to [facility], please contact our office at [phone] between the hours of 8:30 a.m. and 5:00 p.m.  Voicemails left after 4:30 p.m. will not be returned until the following business day.  For prescription refill requests, have your pharmacy contact our office with your prescription refill request.

## 2013-03-27 ENCOUNTER — Ambulatory Visit (HOSPITAL_COMMUNITY)
Admission: RE | Admit: 2013-03-27 | Discharge: 2013-03-27 | Disposition: A | Discharge status: Home / Self Care | Payer: Worker's Compensation | Source: Ambulatory Visit | Attending: Physician Assistant | Admitting: Physician Assistant

## 2013-03-27 DIAGNOSIS — IMO0001 Reserved for inherently not codable concepts without codable children: Secondary | ICD-10-CM | POA: Diagnosis not present

## 2013-03-27 NOTE — Progress Notes (Signed)
Occupational Therapy Treatment Patient Details  Name: [name] [name] MRN: [id number] Date of Birth: 07/08/1950  Today's Date: 03/27/2013 Time: 3295-1884 OT Time Calculation (min): 42 min Manual 1302-1330 (28') Therapeutic Exercises 1330-1344 (14')  Visit#: 2 of 36  Re-eval: 04/18/13    Authorization: BCBS (or workers comp?)  Authorization Time Period: n/a  Authorization Visit#: 2 of    Subjective Symptoms/Limitations Symptoms: "Better than it was last week, but still aching. The nausea's about a constant thing, sometimes worse than others." Limitations: Shoulder Protocol:Phase III (3/16)- Limited PROM, modalities, wean sling 3-6 weeks, isometrics, supine AAROM, pulleys - Phase IV (3/23) - P/AAROM no limits, 30" isometrics, yellow T-band - Phase V 5 weeks (3/30) - yellow t-band abduc/flex, standing AAROM - Phase VI- 6 weeks (4/6) - Full ROM, UBE - Phase VII (7-12 weeks (4/13-5/18) - strengthening Special Tests: FOTO 29/100 Pain Assessment Currently in Pain?: Yes Pain Score: 7  Pain Location: Shoulder Pain Orientation: Right Pain Type: Acute pain   Exercise/Treatments Supine Protraction: PROM;5 reps Horizontal ABduction: PROM;5 reps External Rotation: PROM;5 reps Internal Rotation: PROM;5 reps Flexion: PROM;5 reps ABduction: PROM;5 reps;Limitations Other Supine Exercises: bridges x10 reps Seated Elevation: AROM;10 reps Extension: AROM;10 reps Row: AROM;10 reps     Manual Therapy Manual Therapy: Myofascial release Myofascial Release: MFR and manual stretching to right upper arm, anterior shoulder, upper trap, and scapular regions to decrease restrictions and increase pain-free ROM.  Occupational Therapy Assessment and Plan OT Assessment and Plan Clinical Impression Statement: A: Pt able to start weaning self from sling this week,as its week three. Educated pt on doing so slowly and carefully.  Pt toelrated well PROM and MFR this date, but remains with tenderness and  approx 45 degrees shoulder flexion before significanly increased pain sets in. OT Plan: Follow up on first session PROM and seated exercises. Add ball stretches.   Goals Short Term Goals Short Term Goal 1: Pt will be educated on HEP Short Term Goal 1 Progress: Progressing toward goal Short Term Goal 2: Pt will attain right shoulder PROM to [facility] in order to facilitate improved reaching for brushing her teeth Short Term Goal 2 Progress: Progressing toward goal Short Term Goal 3: Pt will have right shoulder pain of less than 5/10  Short Term Goal 3 Progress: Progressing toward goal Short Term Goal 4: Pt will have min fascial restrictions in right shoulder and upper arm region Short Term Goal 4 Progress: Progressing toward goal Long Term Goals Long Term Goal 1: Pt will achieve highest level of functioning in all ADL, IADL, work, and leisure tasks with RUE. Long Term Goal 1 Progress: Progressing toward goal Long Term Goal 2: Pt will have right shoulder strength of atleast 4/5 to facilitate improved lifting of her granchildren/great-grandchildren. Long Term Goal 2 Progress: Progressing toward goal Long Term Goal 3: Pt will have right shoulder AROM WNL in order to brush teeth with right hand. Long Term Goal 3 Progress: Progressing toward goal Long Term Goal 4: Pt will have pain of less than 2/10 in right shoulder with daily activities Long Term Goal 4 Progress: Progressing toward goal Long Term Goal 5: Pt will have trace fascial restricions in right upper extremity. Long Term Goal 5 Progress: Progressing toward goal  Problem List Patient Active Problem List   Diagnosis Date Noted  . Pain in joint, shoulder region 03/21/2013  . Decreased range of motion of right shoulder 03/21/2013  . Muscle weakness (generalized) 03/21/2013  . Metastatic renal cell carcinoma   .  Pain in thoracic spine 12/09/2011  . DDD (degenerative disc disease), lumbar 12/09/2011  . Right ankle pain 10/28/2011  .  Difficulty in walking 10/28/2011  . TINEA PEDIS 04/10/2008  . DIABETES MELLITUS, TYPE II 04/10/2008  . HEADACHE 04/13/2007  . ABNORMAL ELECTROCARDIOGRAM 04/13/2007  . HYPERLIPIDEMIA 09/03/2006  . DEPRESSION 09/03/2006  . HYPERTENSION 09/03/2006  . COPD 09/03/2006  . CONSTIPATION NOS 09/03/2006  . OSTEOARTHRITIS 09/03/2006  . LOW BACK PAIN, CHRONIC 09/03/2006  . BURSITIS, HIPS, BILATERAL 09/03/2006  . FIBROMYALGIA 09/03/2006  . FATIGUE 09/03/2006  . URINARY INCONTINENCE 09/03/2006    End of Session Activity Tolerance: Patient tolerated treatment well General Behavior During Therapy: [facility] for tasks assessed/performed  GO    Bea Graff, [place], OTR/L [phone]  03/27/2013, 4:12 PM

## 2013-03-28 ENCOUNTER — Telehealth (HOSPITAL_COMMUNITY): Payer: Self-pay

## 2013-03-29 ENCOUNTER — Ambulatory Visit (HOSPITAL_COMMUNITY): Payer: Self-pay | Admitting: Specialist

## 2013-03-31 ENCOUNTER — Ambulatory Visit (HOSPITAL_COMMUNITY)
Admission: RE | Admit: 2013-03-31 | Discharge: 2013-03-31 | Disposition: A | Discharge status: Home / Self Care | Payer: Worker's Compensation | Source: Ambulatory Visit | Attending: Physician Assistant | Admitting: Physician Assistant

## 2013-03-31 DIAGNOSIS — IMO0001 Reserved for inherently not codable concepts without codable children: Secondary | ICD-10-CM | POA: Diagnosis not present

## 2013-03-31 NOTE — Progress Notes (Signed)
Occupational Therapy Treatment Patient Details  Name: [name] [name] MRN: [id number] Date of Birth: 1950/08/11  Today's Date: 03/31/2013 Time: 6606-3016 OT Time Calculation (min): 38 min Manual 1117-1140 (23') Therapeutic Exercises 1140-1155 (15')  Visit#: 3 of 36  Re-eval: 04/18/13    Authorization: BCBS (or workers comp?)  Authorization Time Period: n/a  Authorization Visit#: 3 of    Subjective Symptoms/Limitations Symptoms: "it depends on the time of taime. I'm fine when i'm alseep, but let me get up and then..." Pain Assessment Currently in Pain?: Yes Pain Score: 6  Pain Location: Shoulder Pain Orientation: Right   Exercise/Treatments Supine Protraction: PROM;10 reps Horizontal ABduction: PROM;10 reps External Rotation: PROM;10 reps Internal Rotation: PROM;10 reps Flexion: PROM;10 reps ABduction: PROM;10 reps Other Supine Exercises: bridges x10 reps Seated Elevation: AROM;12 reps Extension: AROM;12 reps Row: AROM;12 reps Therapy Ball Flexion: 10 reps ABduction: 10 reps    Manual Therapy Manual Therapy: Myofascial release Myofascial Release: MFR and manual stretching to right upper arm, anterior shoulder, upper trap, and scapular regions to decrease restrictions and increase pain-free ROM  Occupational Therapy Assessment and Plan OT Assessment and Plan Clinical Impression Statement: Added ball stretches this date - pt tolerated well. Increaed reps with ROM. Pt increased to approx 90 degrees shoulder flexion this date in PROM. Tolerated increased abduction to near [facility]. OT Plan: continue ball stretches. Add isometrics.   Goals Short Term Goals Short Term Goal 1: Pt will be educated on HEP Short Term Goal 1 Progress: Progressing toward goal Short Term Goal 2: Pt will attain right shoulder PROM to [facility] in order to facilitate improved reaching for brushing her teeth Short Term Goal 2 Progress: Progressing toward goal Short Term Goal 3: Pt will have right  shoulder pain of less than 5/10  Short Term Goal 3 Progress: Progressing toward goal Short Term Goal 4: Pt will have min fascial restrictions in right shoulder and upper arm region Short Term Goal 4 Progress: Progressing toward goal Long Term Goals Long Term Goal 1: Pt will achieve highest level of functioning in all ADL, IADL, work, and leisure tasks with RUE. Long Term Goal 1 Progress: Progressing toward goal Long Term Goal 2: Pt will have right shoulder strength of atleast 4/5 to facilitate improved lifting of her granchildren/great-grandchildren. Long Term Goal 2 Progress: Progressing toward goal Long Term Goal 3: Pt will have right shoulder AROM WNL in order to brush teeth with right hand. Long Term Goal 3 Progress: Progressing toward goal Long Term Goal 4: Pt will have pain of less than 2/10 in right shoulder with daily activities Long Term Goal 4 Progress: Progressing toward goal Long Term Goal 5: Pt will have trace fascial restricions in right upper extremity. Long Term Goal 5 Progress: Progressing toward goal  Problem List Patient Active Problem List   Diagnosis Date Noted  . Pain in joint, shoulder region 03/21/2013  . Decreased range of motion of right shoulder 03/21/2013  . Muscle weakness (generalized) 03/21/2013  . Metastatic renal cell carcinoma   . Pain in thoracic spine 12/09/2011  . DDD (degenerative disc disease), lumbar 12/09/2011  . Right ankle pain 10/28/2011  . Difficulty in walking 10/28/2011  . TINEA PEDIS 04/10/2008  . DIABETES MELLITUS, TYPE II 04/10/2008  . HEADACHE 04/13/2007  . ABNORMAL ELECTROCARDIOGRAM 04/13/2007  . HYPERLIPIDEMIA 09/03/2006  . DEPRESSION 09/03/2006  . HYPERTENSION 09/03/2006  . COPD 09/03/2006  . CONSTIPATION NOS 09/03/2006  . OSTEOARTHRITIS 09/03/2006  . LOW BACK PAIN, CHRONIC 09/03/2006  . BURSITIS, HIPS,  BILATERAL 09/03/2006  . FIBROMYALGIA 09/03/2006  . FATIGUE 09/03/2006  . URINARY INCONTINENCE 09/03/2006    End of  Session Activity Tolerance: Patient tolerated treatment well General Behavior During Therapy: [facility] for tasks assessed/performed  GO   Bea Graff, MS, OTR/L [phone]  03/31/2013, 11:56 AM

## 2013-04-03 ENCOUNTER — Ambulatory Visit (HOSPITAL_COMMUNITY): Payer: Self-pay | Admitting: Specialist

## 2013-04-03 ENCOUNTER — Telehealth (HOSPITAL_COMMUNITY): Payer: Self-pay

## 2013-04-04 ENCOUNTER — Other Ambulatory Visit (HOSPITAL_COMMUNITY): Payer: Self-pay | Admitting: Oncology

## 2013-04-04 DIAGNOSIS — C649 Malignant neoplasm of unspecified kidney, except renal pelvis: Secondary | ICD-10-CM

## 2013-04-04 MED ORDER — PAZOPANIB HCL 200 MG PO TABS: 800.0000 mg | ORAL_TABLET | Freq: Every day | ORAL | Status: DC

## 2013-04-05 ENCOUNTER — Ambulatory Visit (HOSPITAL_COMMUNITY): Payer: Self-pay

## 2013-04-10 ENCOUNTER — Ambulatory Visit (HOSPITAL_COMMUNITY)
Admission: RE | Admit: 2013-04-10 | Discharge: 2013-04-10 | Disposition: A | Discharge status: Home / Self Care | Payer: Worker's Compensation | Source: Ambulatory Visit | Attending: Physician Assistant | Admitting: Physician Assistant

## 2013-04-10 DIAGNOSIS — IMO0001 Reserved for inherently not codable concepts without codable children: Secondary | ICD-10-CM | POA: Diagnosis not present

## 2013-04-10 NOTE — Progress Notes (Signed)
Occupational Therapy Treatment Patient Details  Name: [name] [name] MRN: [id number] Date of Birth: [birth year]  Today's Date: 04/10/2013 Time: 1751-0258 OT Time Calculation (min): 47 min Manual 1105-1130 (25') Therapeutic Exercises 1130-1152 (43')  Visit#: 4 of 36  Re-eval: 04/18/13    Authorization: BCBS (or workers comp?)  Authorization Time Period: n/a  Authorization Visit#: 4 of    Subjective Symptoms/Limitations Symptoms: "Stomach virus, I was just feeling terrible, I'm feeling better now." Limitations: Shoulder Protocol:Phase III (3/16)- Limited PROM, modalities, wean sling 3-6 weeks, isometrics, supine AAROM, pulleys - Phase IV (3/23) - P/AAROM no limits, 30" isometrics, yellow T-band - Phase V 5 weeks (3/30) - yellow t-band abduc/flex, standing AAROM - Phase VI- 6 weeks (4/6) - Full ROM, UBE - Phase VII (7-12 weeks (4/13-5/18) - strengthening Pain Assessment Currently in Pain?: Yes Pain Score: 6  Pain Location: Shoulder Pain Type: Acute pain  Precautions/Restrictions     Exercise/Treatments Supine Protraction: PROM;10 reps;AAROM;5 reps Horizontal ABduction: PROM;10 reps;AAROM;5 reps External Rotation: PROM;10 reps;AAROM;5 reps Internal Rotation: PROM;10 reps;AAROM;5 reps Flexion: PROM;10 reps;AAROM;5 reps ABduction: PROM;10 reps;AAROM;5 reps Seated Elevation: AROM;15 reps Extension: AROM;15 reps Row: AROM;15 reps Therapy Ball Flexion: 15 reps ABduction: 15 reps    Manual Therapy Manual Therapy: Myofascial release Myofascial Release: MFR and manual stretching to right upper arm, anterior shoulder, upper trap, and scapular regions to decrease restrictions and increase pain-free ROM.    Occupational Therapy Assessment and Plan OT Assessment and Plan Clinical Impression Statement: Pt has been sick for previous 10 days - timeline has progressed to phase V. Pt had increased tightness this sessuibm but had good respone to MFR and PROM. Added AAROM supine this  session - pt tolerated well. OT Plan: Add yellow theraband for scapular strengthening   Goals Short Term Goals Short Term Goal 1: Pt will be educated on HEP Short Term Goal 1 Progress: Progressing toward goal Short Term Goal 2: Pt will attain right shoulder PROM to [facility] in order to facilitate improved reaching for brushing her teeth Short Term Goal 2 Progress: Progressing toward goal Short Term Goal 3: Pt will have right shoulder pain of less than 5/10  Short Term Goal 3 Progress: Progressing toward goal Short Term Goal 4: Pt will have min fascial restrictions in right shoulder and upper arm region Short Term Goal 4 Progress: Progressing toward goal Long Term Goals Long Term Goal 1: Pt will achieve highest level of functioning in all ADL, IADL, work, and leisure tasks with RUE. Long Term Goal 1 Progress: Progressing toward goal Long Term Goal 2: Pt will have right shoulder strength of atleast 4/5 to facilitate improved lifting of her granchildren/great-grandchildren. Long Term Goal 2 Progress: Progressing toward goal Long Term Goal 3: Pt will have right shoulder AROM WNL in order to brush teeth with right hand. Long Term Goal 3 Progress: Progressing toward goal Long Term Goal 4: Pt will have pain of less than 2/10 in right shoulder with daily activities Long Term Goal 4 Progress: Progressing toward goal Long Term Goal 5: Pt will have trace fascial restricions in right upper extremity. Long Term Goal 5 Progress: Progressing toward goal  Problem List Patient Active Problem List   Diagnosis Date Noted  . Pain in joint, shoulder region 03/21/2013  . Decreased range of motion of right shoulder 03/21/2013  . Muscle weakness (generalized) 03/21/2013  . Metastatic renal cell carcinoma   . Pain in thoracic spine 12/09/2011  . DDD (degenerative disc disease), lumbar 12/09/2011  . Right ankle  pain 10/28/2011  . Difficulty in walking 10/28/2011  . TINEA PEDIS 04/10/2008  . DIABETES MELLITUS,  TYPE II 04/10/2008  . HEADACHE 04/13/2007  . ABNORMAL ELECTROCARDIOGRAM 04/13/2007  . HYPERLIPIDEMIA 09/03/2006  . DEPRESSION 09/03/2006  . HYPERTENSION 09/03/2006  . COPD 09/03/2006  . CONSTIPATION NOS 09/03/2006  . OSTEOARTHRITIS 09/03/2006  . LOW BACK PAIN, CHRONIC 09/03/2006  . BURSITIS, HIPS, BILATERAL 09/03/2006  . FIBROMYALGIA 09/03/2006  . FATIGUE 09/03/2006  . URINARY INCONTINENCE 09/03/2006    End of Session Activity Tolerance: Patient tolerated treatment well General Behavior During Therapy: [facility] for tasks assessed/performed  GO   Bea Graff, MS, OTR/L [phone]  04/10/2013, 11:54 AM

## 2013-04-11 ENCOUNTER — Other Ambulatory Visit (HOSPITAL_COMMUNITY): Payer: Self-pay | Admitting: Oncology

## 2013-04-11 DIAGNOSIS — C649 Malignant neoplasm of unspecified kidney, except renal pelvis: Secondary | ICD-10-CM

## 2013-04-11 DIAGNOSIS — K279 Peptic ulcer, site unspecified, unspecified as acute or chronic, without hemorrhage or perforation: Secondary | ICD-10-CM

## 2013-04-11 MED ORDER — ONDANSETRON HCL 8 MG PO TABS: 8.0000 mg | ORAL_TABLET | Freq: Two times a day (BID) | ORAL | Status: DC | PRN

## 2013-04-11 MED ORDER — SUCRALFATE 1 GM/10ML PO SUSP: 1.0000 g | Freq: Three times a day (TID) | ORAL | Status: DC

## 2013-04-12 ENCOUNTER — Ambulatory Visit (HOSPITAL_COMMUNITY)
Admission: RE | Admit: 2013-04-12 | Discharge: 2013-04-12 | Disposition: A | Discharge status: Home / Self Care | Payer: Worker's Compensation | Source: Ambulatory Visit | Attending: Physician Assistant | Admitting: Physician Assistant

## 2013-04-12 DIAGNOSIS — M25619 Stiffness of unspecified shoulder, not elsewhere classified: Secondary | ICD-10-CM | POA: Diagnosis not present

## 2013-04-12 DIAGNOSIS — M6281 Muscle weakness (generalized): Secondary | ICD-10-CM | POA: Insufficient documentation

## 2013-04-12 DIAGNOSIS — IMO0001 Reserved for inherently not codable concepts without codable children: Secondary | ICD-10-CM | POA: Insufficient documentation

## 2013-04-12 DIAGNOSIS — E119 Type 2 diabetes mellitus without complications: Secondary | ICD-10-CM | POA: Insufficient documentation

## 2013-04-12 DIAGNOSIS — I10 Essential (primary) hypertension: Secondary | ICD-10-CM | POA: Diagnosis not present

## 2013-04-12 DIAGNOSIS — M25519 Pain in unspecified shoulder: Secondary | ICD-10-CM | POA: Insufficient documentation

## 2013-04-12 NOTE — Progress Notes (Signed)
Occupational Therapy Treatment Patient Details  Name: [name] [name] MRN: [id number] Date of Birth: 20-Aug-1950  Today's Date: 04/12/2013 Time: 4315-4008 OT Time Calculation (min): 42 min Manual 1110-1125 (15') Therapeutic Exercises 1125-1152 (54')  Visit#: 5 of 34  Re-eval: 04/18/13    Authorization: BCBS (or workers comp?)  Authorization Time Period: n/a  Authorization Visit#: 5 of    Subjective Symptoms/Limitations Symptoms: "That virus is going through the family now, everybody got it." Limitations: Shoulder Protocol:Phase III (3/16)- Limited PROM, modalities, wean sling 3-6 weeks, isometrics, supine AAROM, pulleys - Phase IV (3/23) - P/AAROM no limits, 30" isometrics, yellow T-band - Phase V 5 weeks (3/30) - yellow t-band abduc/flex, standing AAROM - Phase VI- 6 weeks (4/6) - Full ROM, UBE - Phase VII (7-12 weeks (4/13-5/18) - strengthening Pain Assessment Currently in Pain?: Yes Pain Score: 5  Pain Location: Shoulder Pain Orientation: Right Pain Type: Acute pain  Precautions/Restrictions     Exercise/Treatments Supine Protraction: PROM;AAROM;10 reps Horizontal ABduction: PROM;AAROM;10 reps External Rotation: PROM;AAROM;10 reps Internal Rotation: PROM;AAROM;10 reps Flexion: PROM;AAROM;10 reps ABduction: PROM;AAROM;10 reps Standing Extension: Theraband;10 reps Theraband Level (Shoulder Extension): Level 2 (Red) Row: Theraband;10 reps Theraband Level (Shoulder Row): Level 2 (Red) Other Standing Exercises: Elevation 20 reps Therapy Ball Flexion: 20 reps ABduction: 20 reps Right/Left: 5 reps    Manual Therapy Manual Therapy: Myofascial release Myofascial Release: MFR and manual stretching to right upper arm, anterior shoulder, upper trap, and scapular regions to decrease restrictions and increase pain-free ROM.    Occupational Therapy Assessment and Plan OT Assessment and Plan Clinical Impression Statement: Increased AAROM reps in supine. Pt had good tolerance,  but some increased pain.  Added therband for scapular strengthening - pt tolerated well. OT Plan: Continue theraband and supine AAROM - progress as tolerated.   Goals Short Term Goals Short Term Goal 1: Pt will be educated on HEP Short Term Goal 1 Progress: Progressing toward goal Short Term Goal 2: Pt will attain right shoulder PROM to [facility] in order to facilitate improved reaching for brushing her teeth Short Term Goal 2 Progress: Progressing toward goal Short Term Goal 3: Pt will have right shoulder pain of less than 5/10  Short Term Goal 3 Progress: Progressing toward goal Short Term Goal 4: Pt will have min fascial restrictions in right shoulder and upper arm region Short Term Goal 4 Progress: Progressing toward goal Long Term Goals Long Term Goal 1: Pt will achieve highest level of functioning in all ADL, IADL, work, and leisure tasks with RUE. Long Term Goal 1 Progress: Progressing toward goal Long Term Goal 2: Pt will have right shoulder strength of atleast 4/5 to facilitate improved lifting of her granchildren/great-grandchildren. Long Term Goal 2 Progress: Progressing toward goal Long Term Goal 3: Pt will have right shoulder AROM WNL in order to brush teeth with right hand. Long Term Goal 3 Progress: Progressing toward goal Long Term Goal 4: Pt will have pain of less than 2/10 in right shoulder with daily activities Long Term Goal 4 Progress: Progressing toward goal Long Term Goal 5: Pt will have trace fascial restricions in right upper extremity. Long Term Goal 5 Progress: Progressing toward goal  Problem List Patient Active Problem List   Diagnosis Date Noted  . Pain in joint, shoulder region 03/21/2013  . Decreased range of motion of right shoulder 03/21/2013  . Muscle weakness (generalized) 03/21/2013  . Metastatic renal cell carcinoma   . Pain in thoracic spine 12/09/2011  . DDD (degenerative disc disease), lumbar  12/09/2011  . Right ankle pain 10/28/2011  . Difficulty  in walking 10/28/2011  . TINEA PEDIS 04/10/2008  . DIABETES MELLITUS, TYPE II 04/10/2008  . HEADACHE 04/13/2007  . ABNORMAL ELECTROCARDIOGRAM 04/13/2007  . HYPERLIPIDEMIA 09/03/2006  . DEPRESSION 09/03/2006  . HYPERTENSION 09/03/2006  . COPD 09/03/2006  . CONSTIPATION NOS 09/03/2006  . OSTEOARTHRITIS 09/03/2006  . LOW BACK PAIN, CHRONIC 09/03/2006  . BURSITIS, HIPS, BILATERAL 09/03/2006  . FIBROMYALGIA 09/03/2006  . FATIGUE 09/03/2006  . URINARY INCONTINENCE 09/03/2006    End of Session Activity Tolerance: Patient tolerated treatment well General Behavior During Therapy: [facility] for tasks assessed/performed  GO    Bea Graff, MS, OTR/L [phone]  04/12/2013, 11:53 AM

## 2013-04-14 ENCOUNTER — Ambulatory Visit (HOSPITAL_COMMUNITY)
Admission: RE | Admit: 2013-04-14 | Discharge: 2013-04-14 | Disposition: A | Discharge status: Home / Self Care | Payer: Worker's Compensation | Source: Ambulatory Visit | Attending: Physician Assistant | Admitting: Physician Assistant

## 2013-04-14 DIAGNOSIS — M25519 Pain in unspecified shoulder: Secondary | ICD-10-CM

## 2013-04-14 DIAGNOSIS — M25611 Stiffness of right shoulder, not elsewhere classified: Secondary | ICD-10-CM

## 2013-04-14 DIAGNOSIS — M6281 Muscle weakness (generalized): Secondary | ICD-10-CM

## 2013-04-14 DIAGNOSIS — IMO0001 Reserved for inherently not codable concepts without codable children: Secondary | ICD-10-CM | POA: Diagnosis not present

## 2013-04-14 NOTE — Progress Notes (Signed)
Occupational Therapy Treatment Patient Details  Name: [name] [name] MRN: [id number] Date of Birth: Jun 02, 1950  Today's Date: 04/14/2013 Time: 2725-3664 OT Time Calculation (min): 41 min MFR 4034-7425 11' Therex 9563-8756 30'  Visit#: 6 of 36  Re-eval: 04/18/13    Authorization: [employer] (or workers comp?)  Authorization Time Period: n/a  Authorization Visit#: 6 of    Subjective Symptoms/Limitations Symptoms: S: I was going to wear my sling and I couldn't find it. Pain Assessment Currently in Pain?: Yes Pain Score: 6  Pain Location: Shoulder Pain Orientation: Right Pain Type: Acute pain  Precautions/Restrictions  Precautions Precautions: Shoulder Type of Shoulder Precautions: Protocol - scanned  Exercise/Treatments Supine Protraction: PROM;AAROM;10 reps Horizontal ABduction: PROM;AAROM;10 reps External Rotation: PROM;AAROM;10 reps Internal Rotation: PROM;AAROM;10 reps Flexion: PROM;AAROM;10 reps ABduction: PROM;AAROM;10 reps Seated Elevation: AROM;15 reps Extension: AROM;15 reps Row: AROM;15 reps Standing External Rotation: Theraband;10 reps Theraband Level (Shoulder External Rotation): Level 2 (Red) Internal Rotation: Theraband;10 reps Theraband Level (Shoulder Internal Rotation): Level 2 (Red) Flexion: Theraband;10 reps Theraband Level (Shoulder Flexion): Level 2 (Red) Extension: Theraband;10 reps Theraband Level (Shoulder Extension): Level 2 (Red) Row: Theraband;10 reps Theraband Level (Shoulder Row): Level 2 (Red) Therapy Ball Flexion: 20 reps ABduction: 20 reps Right/Left: 5 reps     Manual Therapy Manual Therapy: Myofascial release Myofascial Release: MFR and manual stretching to right upper arm, anterior shoulder, upper trap, and scapular regions to decrease restrictions and increase pain-free ROM.  Occupational Therapy Assessment and Plan OT Assessment and Plan Clinical Impression Statement: A: Paitent performed all exercises without little pain.  Patient able to achieve 80% passive range during manual stretching.  OT Plan: A: Add thumb tacks, ball on the wall, pulleys   Goals Short Term Goals Short Term Goal 1: Pt will be educated on HEP Short Term Goal 1 Progress: Progressing toward goal Short Term Goal 2: Pt will attain right shoulder PROM to [facility] in order to facilitate improved reaching for brushing her teeth Short Term Goal 2 Progress: Progressing toward goal Short Term Goal 3: Pt will have right shoulder pain of less than 5/10  Short Term Goal 3 Progress: Progressing toward goal Short Term Goal 4: Pt will have min fascial restrictions in right shoulder and upper arm region Short Term Goal 4 Progress: Progressing toward goal Long Term Goals Long Term Goal 1: Pt will achieve highest level of functioning in all ADL, IADL, work, and leisure tasks with RUE. Long Term Goal 1 Progress: Progressing toward goal Long Term Goal 2: Pt will have right shoulder strength of atleast 4/5 to facilitate improved lifting of her granchildren/great-grandchildren. Long Term Goal 2 Progress: Progressing toward goal Long Term Goal 3: Pt will have right shoulder AROM WNL in order to brush teeth with right hand. Long Term Goal 3 Progress: Progressing toward goal Long Term Goal 4: Pt will have pain of less than 2/10 in right shoulder with daily activities Long Term Goal 4 Progress: Progressing toward goal Long Term Goal 5: Pt will have trace fascial restricions in right upper extremity. Long Term Goal 5 Progress: Progressing toward goal  Problem List Patient Active Problem List   Diagnosis Date Noted  . Pain in joint, shoulder region 03/21/2013  . Decreased range of motion of right shoulder 03/21/2013  . Muscle weakness (generalized) 03/21/2013  . Metastatic renal cell carcinoma   . Pain in thoracic spine 12/09/2011  . DDD (degenerative disc disease), lumbar 12/09/2011  . Right ankle pain 10/28/2011  . Difficulty in walking 10/28/2011  . TINEA  PEDIS 04/10/2008  . DIABETES MELLITUS, TYPE II 04/10/2008  . HEADACHE 04/13/2007  . ABNORMAL ELECTROCARDIOGRAM 04/13/2007  . HYPERLIPIDEMIA 09/03/2006  . DEPRESSION 09/03/2006  . HYPERTENSION 09/03/2006  . COPD 09/03/2006  . CONSTIPATION NOS 09/03/2006  . OSTEOARTHRITIS 09/03/2006  . LOW BACK PAIN, CHRONIC 09/03/2006  . BURSITIS, HIPS, BILATERAL 09/03/2006  . FIBROMYALGIA 09/03/2006  . FATIGUE 09/03/2006  . URINARY INCONTINENCE 09/03/2006    End of Session Activity Tolerance: Patient tolerated treatment well General Behavior During Therapy: [facility] for tasks assessed/performed   Ailene Ravel, OTR/L,CBIS   04/14/2013, 11:54 AM

## 2013-04-17 ENCOUNTER — Encounter: Payer: Self-pay | Admitting: Physician Assistant

## 2013-04-17 ENCOUNTER — Ambulatory Visit (INDEPENDENT_AMBULATORY_CARE_PROVIDER_SITE_OTHER): Payer: BC Managed Care – PPO | Admitting: Physician Assistant

## 2013-04-17 ENCOUNTER — Telehealth: Payer: Self-pay | Admitting: Family Medicine

## 2013-04-17 ENCOUNTER — Other Ambulatory Visit: Payer: Self-pay | Admitting: Family Medicine

## 2013-04-17 VITALS — BP 120/82 | HR 64 | Temp 97.8°F | Resp 18 | Ht 65.0 in | Wt 188.0 lb

## 2013-04-17 DIAGNOSIS — E119 Type 2 diabetes mellitus without complications: Secondary | ICD-10-CM

## 2013-04-17 DIAGNOSIS — E785 Hyperlipidemia, unspecified: Secondary | ICD-10-CM

## 2013-04-17 DIAGNOSIS — C649 Malignant neoplasm of unspecified kidney, except renal pelvis: Secondary | ICD-10-CM

## 2013-04-17 DIAGNOSIS — I10 Essential (primary) hypertension: Secondary | ICD-10-CM

## 2013-04-17 DIAGNOSIS — K219 Gastro-esophageal reflux disease without esophagitis: Secondary | ICD-10-CM

## 2013-04-17 DIAGNOSIS — N2889 Other specified disorders of kidney and ureter: Secondary | ICD-10-CM

## 2013-04-17 DIAGNOSIS — K279 Peptic ulcer, site unspecified, unspecified as acute or chronic, without hemorrhage or perforation: Secondary | ICD-10-CM

## 2013-04-17 LAB — HEMOGLOBIN A1C, FINGERSTICK: Hgb A1C (fingerstick): 5.2 % (ref <5.7)

## 2013-04-17 MED ORDER — SUCRALFATE 1 GM/10ML PO SUSP: 1.0000 g | Freq: Three times a day (TID) | ORAL | Status: DC

## 2013-04-17 MED ORDER — PAROXETINE HCL 20 MG PO TABS: 20.0000 mg | ORAL_TABLET | Freq: Two times a day (BID) | ORAL | Status: DC

## 2013-04-17 MED ORDER — SIMVASTATIN 40 MG PO TABS: ORAL_TABLET | ORAL | Status: DC

## 2013-04-17 MED ORDER — METOCLOPRAMIDE HCL 5 MG PO TABS: ORAL_TABLET | ORAL | Status: DC

## 2013-04-17 MED ORDER — LISINOPRIL-HYDROCHLOROTHIAZIDE 20-12.5 MG PO TABS: 1.0000 | ORAL_TABLET | Freq: Every day | ORAL | Status: DC

## 2013-04-17 MED ORDER — METFORMIN HCL 1000 MG PO TABS: 1000.0000 mg | ORAL_TABLET | Freq: Two times a day (BID) | ORAL | Status: DC

## 2013-04-17 MED ORDER — RANITIDINE HCL 150 MG PO TABS
150.0000 mg | ORAL_TABLET | Freq: Two times a day (BID) | ORAL | Status: DC
Start: 2013-04-17 — End: 2014-02-12

## 2013-04-17 NOTE — Telephone Encounter (Signed)
Medication refilled per protocol. 

## 2013-04-17 NOTE — Telephone Encounter (Signed)
Message copied by Olena Mater on Mon Apr 17, 2013  4:43 PM ------      Message from: Dena Billet      Created: Mon Apr 17, 2013  4:26 PM       Prior to today, patient had not had office visit with me in a long time and had not been having A1Cc done and had not had sugar monitored.      Patient is on metformin 1000 mg twice a day.      Tell her that her A1c is very low at 5.2 which is indicating that her blood sugar is too low.      Tell her to stop taking the metformin for now.      Tell her to check her blood sugar off of  Metformin.      Check blood sugar twice a day --these readings needs to be fasting morning the other reading can be at different times of day.      Schedule followup office visit with me in 2 or 3 weeks and bring these readings with her to the appointment. ------

## 2013-04-17 NOTE — Telephone Encounter (Signed)
Discussed A1C reading per provider.  Understands to STOP Metformin.  Take BS reading x 2-3 weeks, AM fasting and then 2hr PP lunch or dinner.  Return with readings in 2-3 weeks.  States will call back to make appt.
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
## 2013-04-18 ENCOUNTER — Ambulatory Visit (HOSPITAL_COMMUNITY)
Admission: RE | Admit: 2013-04-18 | Discharge: 2013-04-18 | Disposition: A | Discharge status: Home / Self Care | Payer: Worker's Compensation | Source: Ambulatory Visit | Attending: Physician Assistant | Admitting: Physician Assistant

## 2013-04-18 DIAGNOSIS — IMO0001 Reserved for inherently not codable concepts without codable children: Secondary | ICD-10-CM | POA: Diagnosis not present

## 2013-04-18 NOTE — Progress Notes (Signed)
Occupational Therapy Treatment Patient Details  Name: [name] [name] MRN: [id number] Date of Birth: 1950-09-19  Today's Date: 04/18/2013 Time: 2831-5176 OT Time Calculation (min): 39 min Manual 1108-1120 (12') Therapeutic Exercises 1120-1147 (27')  Visit#: 7 of 36  Re-eval: 04/18/13    Authorization: [employer] (or workers comp?)  Authorization Time Period: n/a  Authorization Visit#: 7 of    Subjective Symptoms/Limitations Symptoms: I took it off in the bedroom (the sling and I think a little short person got ahold of it. It was hiding from me in the living room." Limitations: Shoulder Protocol:Phase III (3/16)- Limited PROM, modalities, wean sling 3-6 weeks, isometrics, supine AAROM, pulleys - Phase IV (3/23) - P/AAROM no limits, 30" isometrics, yellow T-band - Phase V 5 weeks (3/30) - yellow t-band abduc/flex, standing AAROM - Phase VI- 6 weeks (4/6) - Full ROM, UBE - Phase VII (7-12 weeks (4/13-5/18) - strengthening Pain Assessment Currently in Pain?: Yes Pain Score: 5  Pain Location: Shoulder Pain Orientation: Right Pain Type: Acute pain  Precautions/Restrictions     Exercise/Treatments Supine Protraction: PROM;10 reps;AAROM;12 reps Horizontal ABduction: PROM;10 reps;AAROM;12 reps External Rotation: PROM;10 reps;AAROM;12 reps Internal Rotation: PROM;10 reps;AAROM;12 reps Flexion: PROM;10 reps;AAROM;12 reps ABduction: PROM;10 reps;AAROM;12 reps Seated Elevation: AROM;15 reps Extension: AROM;15 reps Row: AROM;15 reps Standing External Rotation: Theraband;10 reps Theraband Level (Shoulder External Rotation): Level 2 (Red) Internal Rotation: Theraband;10 reps Theraband Level (Shoulder Internal Rotation): Level 2 (Red) Extension: Theraband;10 reps Theraband Level (Shoulder Extension): Level 2 (Red) Row: Theraband;10 reps Theraband Level (Shoulder Row): Level 2 (Red) Pulleys Flexion: 1 minute ABduction: 1 minute ROM / Strengthening / Isometric Strengthening Thumb Tacks:  1'    Manual Therapy Manual Therapy: Myofascial release Myofascial Release: MFR and manual stretching to right upper arm, anterior shoulder, upper trap, and scapular regions to decrease restrictions and increase pain-free ROM.    Occupational Therapy Assessment and Plan OT Assessment and Plan Clinical Impression Statement: Pt tolerated well all PROM and AAROM. Added thumbtacks and pulleys this session - pt toelrated well with no complints of discomfort, just fatigue. OT Plan: Re-Evaluation!   Goals Short Term Goals Short Term Goal 1: Pt will be educated on HEP Short Term Goal 1 Progress: Progressing toward goal Short Term Goal 2: Pt will attain right shoulder PROM to [facility] in order to facilitate improved reaching for brushing her teeth Short Term Goal 2 Progress: Progressing toward goal Short Term Goal 3: Pt will have right shoulder pain of less than 5/10  Short Term Goal 3 Progress: Progressing toward goal Short Term Goal 4: Pt will have min fascial restrictions in right shoulder and upper arm region Short Term Goal 4 Progress: Progressing toward goal Long Term Goals Long Term Goal 1: Pt will achieve highest level of functioning in all ADL, IADL, work, and leisure tasks with RUE. Long Term Goal 1 Progress: Progressing toward goal Long Term Goal 2: Pt will have right shoulder strength of atleast 4/5 to facilitate improved lifting of her granchildren/great-grandchildren. Long Term Goal 2 Progress: Progressing toward goal Long Term Goal 3: Pt will have right shoulder AROM WNL in order to brush teeth with right hand. Long Term Goal 3 Progress: Progressing toward goal Long Term Goal 4: Pt will have pain of less than 2/10 in right shoulder with daily activities Long Term Goal 4 Progress: Progressing toward goal Long Term Goal 5: Pt will have trace fascial restricions in right upper extremity. Long Term Goal 5 Progress: Progressing toward goal  Problem List Patient Active Problem List  Diagnosis Date Noted  . Pain in joint, shoulder region 03/21/2013  . Decreased range of motion of right shoulder 03/21/2013  . Muscle weakness (generalized) 03/21/2013  . Metastatic renal cell carcinoma   . Pain in thoracic spine 12/09/2011  . DDD (degenerative disc disease), lumbar 12/09/2011  . Right ankle pain 10/28/2011  . Difficulty in walking 10/28/2011  . TINEA PEDIS 04/10/2008  . DIABETES MELLITUS, TYPE II 04/10/2008  . HEADACHE 04/13/2007  . ABNORMAL ELECTROCARDIOGRAM 04/13/2007  . HYPERLIPIDEMIA 09/03/2006  . DEPRESSION 09/03/2006  . HYPERTENSION 09/03/2006  . COPD 09/03/2006  . CONSTIPATION NOS 09/03/2006  . OSTEOARTHRITIS 09/03/2006  . LOW BACK PAIN, CHRONIC 09/03/2006  . BURSITIS, HIPS, BILATERAL 09/03/2006  . FIBROMYALGIA 09/03/2006  . FATIGUE 09/03/2006  . URINARY INCONTINENCE 09/03/2006    End of Session Activity Tolerance: Patient tolerated treatment well General Behavior During Therapy: [facility] for tasks assessed/performed  GO    Bea Graff, MS, OTR/L [phone]  04/18/2013, 11:50 AM

## 2013-04-25 ENCOUNTER — Ambulatory Visit (HOSPITAL_COMMUNITY)
Admission: RE | Admit: 2013-04-25 | Discharge: 2013-04-25 | Disposition: A | Discharge status: Home / Self Care | Payer: Worker's Compensation | Source: Ambulatory Visit | Attending: Physician Assistant | Admitting: Physician Assistant

## 2013-04-25 DIAGNOSIS — IMO0001 Reserved for inherently not codable concepts without codable children: Secondary | ICD-10-CM | POA: Diagnosis not present

## 2013-04-25 DIAGNOSIS — M6281 Muscle weakness (generalized): Secondary | ICD-10-CM

## 2013-04-25 DIAGNOSIS — M25519 Pain in unspecified shoulder: Secondary | ICD-10-CM

## 2013-04-25 DIAGNOSIS — M25611 Stiffness of right shoulder, not elsewhere classified: Secondary | ICD-10-CM
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
## 2013-04-28 ENCOUNTER — Ambulatory Visit (HOSPITAL_COMMUNITY)
Admission: RE | Admit: 2013-04-28 | Discharge: 2013-04-28 | Disposition: A | Discharge status: Home / Self Care | Payer: Worker's Compensation | Source: Ambulatory Visit | Attending: Physician Assistant | Admitting: Physician Assistant

## 2013-04-28 ENCOUNTER — Encounter (HOSPITAL_COMMUNITY): Payer: Self-pay | Admitting: Psychiatry

## 2013-04-28 ENCOUNTER — Ambulatory Visit (INDEPENDENT_AMBULATORY_CARE_PROVIDER_SITE_OTHER): Payer: BC Managed Care – PPO | Admitting: Psychiatry

## 2013-04-28 VITALS — BP 110/70 | Ht 65.0 in | Wt 190.0 lb

## 2013-04-28 DIAGNOSIS — N2889 Other specified disorders of kidney and ureter: Secondary | ICD-10-CM

## 2013-04-28 DIAGNOSIS — F3289 Other specified depressive episodes: Secondary | ICD-10-CM

## 2013-04-28 DIAGNOSIS — F329 Major depressive disorder, single episode, unspecified: Secondary | ICD-10-CM

## 2013-04-28 DIAGNOSIS — IMO0001 Reserved for inherently not codable concepts without codable children: Secondary | ICD-10-CM | POA: Diagnosis not present

## 2013-04-28 MED ORDER — PAROXETINE HCL 20 MG PO TABS: 20.0000 mg | ORAL_TABLET | Freq: Two times a day (BID) | ORAL | Status: DC

## 2013-04-28 MED ORDER — ZOLPIDEM TARTRATE 5 MG PO TABS: 5.0000 mg | ORAL_TABLET | Freq: Every evening | ORAL | Status: DC | PRN

## 2013-04-28 MED ORDER — METHYLPHENIDATE HCL 10 MG PO TABS: 10.0000 mg | ORAL_TABLET | Freq: Two times a day (BID) | ORAL | Status: DC
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
## 2013-05-01 ENCOUNTER — Ambulatory Visit (HOSPITAL_COMMUNITY): Payer: Self-pay | Admitting: Psychiatry

## 2013-05-02 ENCOUNTER — Ambulatory Visit (HOSPITAL_COMMUNITY)
Admission: RE | Admit: 2013-05-02 | Discharge: 2013-05-02 | Disposition: A | Discharge status: Home / Self Care | Payer: Worker's Compensation | Source: Ambulatory Visit | Attending: Physician Assistant | Admitting: Physician Assistant

## 2013-05-02 DIAGNOSIS — IMO0001 Reserved for inherently not codable concepts without codable children: Secondary | ICD-10-CM | POA: Diagnosis not present

## 2013-05-02 DIAGNOSIS — M25611 Stiffness of right shoulder, not elsewhere classified: Secondary | ICD-10-CM

## 2013-05-02 DIAGNOSIS — M25519 Pain in unspecified shoulder: Secondary | ICD-10-CM

## 2013-05-02 DIAGNOSIS — M6281 Muscle weakness (generalized): Secondary | ICD-10-CM

## 2013-05-02 NOTE — Progress Notes (Signed)
Occupational Therapy Treatment Patient Details  Name: [name] [name] MRN: [id number] Date of Birth: July 27, 1950  Today's Date: 05/02/2013 Time: 8502-7741 OT Time Calculation (min): 44 min Manual therapy [phone] 20' Therapeutic exercises [phone] 24' Visit#: 10 of 36  Re-eval: 05/23/13      Subjective  S:  What ever you did last week, it really helped my shoudler! Limitations: Shoulder Protocol:Phase III (3/16)- Limited PROM, modalities, wean sling 3-6 weeks, isometrics, supine AAROM, pulleys - Phase IV (3/23) - P/AAROM no limits, 30" isometrics, yellow T-band - Phase V 5 weeks (3/30) - yellow t-band abduc/flex, standing AAROM - Phase VI- 6 weeks (4/6) - Full ROM, UBE - Phase VII (7-12 weeks (4/13-5/18) - strengthening Pain Assessment Currently in Pain?: Yes Pain Score: 4  Pain Location: Shoulder Pain Orientation: Right Pain Type: Acute pain  Precautions/Restrictions   progress as tolerated following protocol above  Exercise/Treatments Supine Protraction: PROM;10 reps;AAROM;15 reps Horizontal ABduction: PROM;10 reps;AAROM;15 reps External Rotation: PROM;10 reps;AAROM;15 reps Internal Rotation: PROM;5 reps;AAROM;15 reps Flexion: PROM;10 reps;AAROM;15 reps ABduction: PROM;10 reps;AAROM;15 reps Standing Protraction: AAROM;10 reps Horizontal ABduction: AAROM;10 reps External Rotation: AAROM;10 reps;Theraband;15 reps Theraband Level (Shoulder External Rotation): Level 2 (Red) Internal Rotation: AAROM;10 reps;Theraband;15 reps Theraband Level (Shoulder Internal Rotation): Level 2 (Red) Flexion: AAROM;10 reps ABduction: AAROM;10 reps Extension: Theraband;15 reps Theraband Level (Shoulder Extension): Level 2 (Red) Row: Theraband;15 reps Theraband Level (Shoulder Row): Level 2 (Red) Retraction: Theraband;15 reps Theraband Level (Shoulder Retraction): Level 2 (Red) Pulleys Flexion: 2 minutes ABduction: 2 minutes ROM / Strengthening / Isometric Strengthening Thumb Tacks: 1'       Manual Therapy Manual Therapy: Myofascial release Myofascial Release: MFR and manual stretching to right upper arm, scapular, and shoulder region to decrease pain and fascial restrictions improve pain free mobility. Manual cervical traction with MFR to suboccipital region, upper trapezius, and sternocleidomastoid.  Occupational Therapy Assessment and Plan OT Assessment and Plan Clinical Impression Statement: A:  Patient had good relief from pain with MFR treatment for entire weekend.  Good form with AAROM in supine and standing. OT Plan: P: Begin AROM in supine and standing.  Add wall wash, dc pulleys   Goals Short Term Goals Short Term Goal 1: Pt will be educated on HEP Short Term Goal 2: Pt will attain right shoulder PROM to [facility] in order to facilitate improved reaching for brushing her teeth Short Term Goal 3: Pt will have right shoulder pain of less than 5/10  Short Term Goal 4: Pt will have min fascial restrictions in right shoulder and upper arm region Long Term Goals Long Term Goal 1: Pt will achieve highest level of functioning in all ADL, IADL, work, and leisure tasks with RUE. Long Term Goal 2: Pt will have right shoulder strength of atleast 4+/5 to facilitate improved lifting of her granchildren/great-grandchildren. Long Term Goal 3: Pt will have right shoulder AROM WNL in order to brush teeth with right hand. Long Term Goal 4: Pt will have pain of less than 2/10 in right shoulder with daily activities Long Term Goal 5: Pt will have trace fascial restricions in right upper extremity.  Problem List Patient Active Problem List   Diagnosis Date Noted  . Pain in joint, shoulder region 03/21/2013  . Decreased range of motion of right shoulder 03/21/2013  . Muscle weakness (generalized) 03/21/2013  . Metastatic renal cell carcinoma   . Pain in thoracic spine 12/09/2011  . DDD (degenerative disc disease), lumbar 12/09/2011  . Right ankle pain 10/28/2011  . Difficulty in  walking 10/28/2011  . TINEA PEDIS 04/10/2008  . DIABETES MELLITUS, TYPE II 04/10/2008  . HEADACHE 04/13/2007  . ABNORMAL ELECTROCARDIOGRAM 04/13/2007  . HYPERLIPIDEMIA 09/03/2006  . DEPRESSION 09/03/2006  . HYPERTENSION 09/03/2006  . COPD 09/03/2006  . CONSTIPATION NOS 09/03/2006  . OSTEOARTHRITIS 09/03/2006  . LOW BACK PAIN, CHRONIC 09/03/2006  . BURSITIS, HIPS, BILATERAL 09/03/2006  . FIBROMYALGIA 09/03/2006  . FATIGUE 09/03/2006  . URINARY INCONTINENCE 09/03/2006    End of Session Activity Tolerance: Patient tolerated treatment well General Behavior During Therapy: [facility] for tasks assessed/performed  [place], OTR/L [phone]  05/02/2013, 10:42 AM

## 2013-05-04 ENCOUNTER — Ambulatory Visit (HOSPITAL_COMMUNITY)
Admission: RE | Admit: 2013-05-04 | Discharge: 2013-05-04 | Disposition: A | Discharge status: Home / Self Care | Payer: Worker's Compensation | Source: Ambulatory Visit | Attending: Physician Assistant | Admitting: Physician Assistant

## 2013-05-04 ENCOUNTER — Telehealth (HOSPITAL_COMMUNITY): Payer: Self-pay

## 2013-05-04 ENCOUNTER — Other Ambulatory Visit (HOSPITAL_COMMUNITY): Payer: Self-pay | Admitting: Oncology

## 2013-05-04 DIAGNOSIS — M6281 Muscle weakness (generalized): Secondary | ICD-10-CM

## 2013-05-04 DIAGNOSIS — M25519 Pain in unspecified shoulder: Secondary | ICD-10-CM

## 2013-05-04 DIAGNOSIS — C649 Malignant neoplasm of unspecified kidney, except renal pelvis: Secondary | ICD-10-CM

## 2013-05-04 DIAGNOSIS — IMO0001 Reserved for inherently not codable concepts without codable children: Secondary | ICD-10-CM | POA: Diagnosis not present

## 2013-05-04 DIAGNOSIS — M25611 Stiffness of right shoulder, not elsewhere classified: Secondary | ICD-10-CM

## 2013-05-04 MED ORDER — PAZOPANIB HCL 200 MG PO TABS: 800.0000 mg | ORAL_TABLET | Freq: Every day | ORAL | Status: DC

## 2013-05-04 NOTE — Telephone Encounter (Signed)
Call from pharmacy requesting refill for Votrient 200mg  # 120 /1 month's supply.  Can be called to [phone] or faxed to [phone].  Needs asap.

## 2013-05-04 NOTE — Progress Notes (Signed)
Occupational Therapy Treatment Patient Details  Name: [name] [name] MRN: [id number] Date of Birth: Nov 21, 1950  Today's Date: 05/04/2013 Time: 3810-1751 OT Time Calculation (min): 34 min MFR 1019-1030 11' Therex 0258-5277 23'  Visit#: 11 of 36  Re-eval: 05/23/13    Authorization:    Authorization Time Period:    Authorization Visit#:   of    Subjective Symptoms/Limitations Symptoms: S: I think I may have overdid it yesterday. Limitations: Shoulder Protocol:Phase III (3/16)- Limited PROM, modalities, wean sling 3-6 weeks, isometrics, supine AAROM, pulleys - Phase IV (3/23) - P/AAROM no limits, 30" isometrics, yellow T-band - Phase V 5 weeks (3/30) - yellow t-band abduc/flex, standing AAROM - Phase VI- 6 weeks (4/6) - Full ROM, UBE - Phase VII (7-12 weeks (4/13-5/18) - strengthening Pain Assessment Currently in Pain?: Yes Pain Score: 5  Pain Location: Shoulder Pain Orientation: Right Pain Type: Acute pain  Precautions/Restrictions  Precautions Precautions: Shoulder Type of Shoulder Precautions: Protocol - scanned  Exercise/Treatments Supine Protraction: PROM;AROM;10 reps Horizontal ABduction: PROM;AROM;10 reps External Rotation: PROM;AROM;10 reps Internal Rotation: PROM;AROM;10 reps Flexion: PROM;AROM;10 reps ABduction: PROM;AROM;10 reps Standing Protraction: AROM;10 reps Horizontal ABduction: AROM;10 reps External Rotation: AROM;10 reps Internal Rotation: AROM;10 reps Flexion: AROM;10 reps ABduction: AROM;10 reps Extension: Theraband;15 reps Theraband Level (Shoulder Extension): Level 2 (Red) Row: Theraband;15 reps Theraband Level (Shoulder Row): Level 2 (Red) Retraction: Theraband;15 reps Theraband Level (Shoulder Retraction): Level 2 (Red) Pulleys Flexion:  (d/c) ABduction:  (d/c) ROM / Strengthening / Isometric Strengthening Wall Wash: 1'       Manual Therapy Manual Therapy: Myofascial release Myofascial Release: MFR and manual stretching to right  upper arm, scapular, and shoulder region to decrease pain and fascial restrictions improve pain free mobility. Manual cervical traction with MFR to suboccipital region, upper trapezius, and sternocleidomastoid  Occupational Therapy Assessment and Plan OT Assessment and Plan Clinical Impression Statement: A: Added AROM exercises in supine and standing, and wall wash. Patient tolerated well with rest breaks inbetween sets due to fatigue. Limited in joint mobiilty during standing AROM shoulder flexion and abduction.  OT Plan: P: Add X to V arms and W arms   Goals Short Term Goals Short Term Goal 1: Pt will be educated on HEP Short Term Goal 2: Pt will attain right shoulder PROM to [facility] in order to facilitate improved reaching for brushing her teeth Short Term Goal 3: Pt will have right shoulder pain of less than 5/10  Short Term Goal 4: Pt will have min fascial restrictions in right shoulder and upper arm region Long Term Goals Long Term Goal 1: Pt will achieve highest level of functioning in all ADL, IADL, work, and leisure tasks with RUE. Long Term Goal 2: Pt will have right shoulder strength of atleast 4+/5 to facilitate improved lifting of her granchildren/great-grandchildren. Long Term Goal 3: Pt will have right shoulder AROM WNL in order to brush teeth with right hand. Long Term Goal 4: Pt will have pain of less than 2/10 in right shoulder with daily activities Long Term Goal 5: Pt will have trace fascial restricions in right upper extremity.  Problem List Patient Active Problem List   Diagnosis Date Noted  . Pain in joint, shoulder region 03/21/2013  . Decreased range of motion of right shoulder 03/21/2013  . Muscle weakness (generalized) 03/21/2013  . Metastatic renal cell carcinoma   . Pain in thoracic spine 12/09/2011  . DDD (degenerative disc disease), lumbar 12/09/2011  . Right ankle pain 10/28/2011  . Difficulty in walking 10/28/2011  .  TINEA PEDIS 04/10/2008  . DIABETES  MELLITUS, TYPE II 04/10/2008  . HEADACHE 04/13/2007  . ABNORMAL ELECTROCARDIOGRAM 04/13/2007  . HYPERLIPIDEMIA 09/03/2006  . DEPRESSION 09/03/2006  . HYPERTENSION 09/03/2006  . COPD 09/03/2006  . CONSTIPATION NOS 09/03/2006  . OSTEOARTHRITIS 09/03/2006  . LOW BACK PAIN, CHRONIC 09/03/2006  . BURSITIS, HIPS, BILATERAL 09/03/2006  . FIBROMYALGIA 09/03/2006  . FATIGUE 09/03/2006  . URINARY INCONTINENCE 09/03/2006    End of Session Activity Tolerance: Patient tolerated treatment well General Behavior During Therapy: [facility] for tasks assessed/performed   Ailene Ravel, OTR/L,CBIS   05/04/2013, 10:55 AM

## 2013-05-08 ENCOUNTER — Other Ambulatory Visit: Payer: BC Managed Care – PPO

## 2013-05-08 DIAGNOSIS — E785 Hyperlipidemia, unspecified: Secondary | ICD-10-CM

## 2013-05-08 LAB — LIPID PANEL
Cholesterol: 182 mg/dL (ref 0–200)
HDL: 48 mg/dL (ref >39)
LDL Cholesterol: 106 mg/dL — ABNORMAL HIGH (ref 0–99)
Total CHOL/HDL Ratio: 3.8 Ratio
Triglycerides: 141 mg/dL (ref <150)
VLDL: 28 mg/dL (ref 0–40)

## 2013-05-09 ENCOUNTER — Telehealth: Payer: Self-pay | Admitting: Family Medicine

## 2013-05-09 ENCOUNTER — Ambulatory Visit (HOSPITAL_COMMUNITY)
Admission: RE | Admit: 2013-05-09 | Discharge: 2013-05-09 | Disposition: A | Discharge status: Home / Self Care | Payer: Worker's Compensation | Source: Ambulatory Visit | Attending: Physician Assistant | Admitting: Physician Assistant

## 2013-05-09 DIAGNOSIS — I10 Essential (primary) hypertension: Secondary | ICD-10-CM

## 2013-05-09 DIAGNOSIS — E785 Hyperlipidemia, unspecified: Secondary | ICD-10-CM

## 2013-05-09 DIAGNOSIS — Z79899 Other long term (current) drug therapy: Secondary | ICD-10-CM

## 2013-05-09 DIAGNOSIS — E119 Type 2 diabetes mellitus without complications: Secondary | ICD-10-CM

## 2013-05-09 DIAGNOSIS — IMO0001 Reserved for inherently not codable concepts without codable children: Secondary | ICD-10-CM | POA: Diagnosis not present

## 2013-05-09 NOTE — Telephone Encounter (Signed)
Pt returned my call in reference to her lab work  Given provider recommendations.  Does have 6 mth follow up appt.  Put lab orders in, she will come week before appt to have done fastng

## 2013-05-09 NOTE — Telephone Encounter (Signed)
Message copied by Olena Mater on Tue May 09, 2013 12:25 PM ------      Message from: Dena Billet      Created: Tue May 09, 2013  8:30 AM       She has diabetes. Therefore, Goal LDL usually , 100, but she has cancer.        She is on Zocor 40. She also has cancer. Therefore I will not push up her Zocor dose.      Tell patient the cholesterol numbers look good and to continue her Zocor 40 the same.      when patient came for this recent last office visit with me it had been a long time between visits and between labs.      Tell her to have an office visit with me in 6 months and to come fasting so that we can do labs at that time. ------

## 2013-05-09 NOTE — Progress Notes (Signed)
Occupational Therapy Treatment Patient Details  Name: [name] [name] MRN: [id number] Date of Birth: Jun 08, 1950  Today's Date: 05/09/2013 Time: 2725-3664 OT Time Calculation (min): 42 min Manual [phone] (26') Therapeutic Exercises 1000-1016 (16')  Visit#: 12 of 36  Re-eval: 05/23/13    Authorization: [employer] (or workers comp?)  Authorization Time Period: n/a  Authorization Visit#: 12 of    Subjective Symptoms/Limitations Symptoms: "I do a lot of things - i do try to remember what not to do with it." Limitations: Shoulder Protocol:Phase III (3/16)- Limited PROM, modalities, wean sling 3-6 weeks, isometrics, supine AAROM, pulleys - Phase IV (3/23) - P/AAROM no limits, 30" isometrics, yellow T-band - Phase V 5 weeks (3/30) - yellow t-band abduc/flex, standing AAROM - Phase VI- 6 weeks (4/6) - Full ROM, UBE - Phase VII (7-12 weeks (4/13-5/18) - strengthening Pain Assessment Currently in Pain?: Yes Pain Score: 3  Pain Location: Shoulder Pain Orientation: Right Pain Type: Acute pain  Precautions/Restrictions     Exercise/Treatments Supine Protraction: PROM;AROM;10 reps Horizontal ABduction: PROM;AROM;10 reps External Rotation: PROM;AROM;10 reps Internal Rotation: PROM;AROM;10 reps Flexion: PROM;AROM;10 reps ABduction: PROM;AROM;10 reps Standing Protraction: AROM;10 reps Horizontal ABduction: AROM;10 reps External Rotation: AROM;10 reps Internal Rotation: AROM;10 reps Flexion: AROM;10 reps ABduction: AROM;10 reps ROM / Strengthening / Isometric Strengthening "W" Arms: 10x X to V Arms: 10x   Manual Therapy Manual Therapy: Myofascial release Myofascial Release: MFR and manual stretching to right upper arm, scapular, and shoulder region to decrease pain and fascial restrictions improve pain free mobility. Manual cervical traction with MFR to suboccipital region, upper trapezius, and sternocleidomastoid.    Occupational Therapy Assessment and Plan OT Assessment and  Plan Clinical Impression Statement: Pt tolerated well continuation of AROM in supine and standing, but no increase in pain. Added x-v and w arms - pt fatigued with x-v, but tolerated well and had good understanding. OT Plan: Increase supine AROM reps.   Goals Short Term Goals Short Term Goal 1: Pt will be educated on HEP Short Term Goal 1 Progress: Met Short Term Goal 2: Pt will attain right shoulder PROM to [facility] in order to facilitate improved reaching for brushing her teeth Short Term Goal 2 Progress: Met Short Term Goal 3: Pt will have right shoulder pain of less than 5/10  Short Term Goal 3 Progress: Progressing toward goal Short Term Goal 4: Pt will have min fascial restrictions in right shoulder and upper arm region Short Term Goal 4 Progress: Progressing toward goal Long Term Goals Long Term Goal 1: Pt will achieve highest level of functioning in all ADL, IADL, work, and leisure tasks with RUE. Long Term Goal 1 Progress: Progressing toward goal Long Term Goal 2: Pt will have right shoulder strength of atleast 4+/5 to facilitate improved lifting of her granchildren/great-grandchildren. Long Term Goal 2 Progress: Progressing toward goal Long Term Goal 3: Pt will have right shoulder AROM WNL in order to brush teeth with right hand. Long Term Goal 3 Progress: Progressing toward goal Long Term Goal 4: Pt will have pain of less than 2/10 in right shoulder with daily activities Long Term Goal 4 Progress: Progressing toward goal Long Term Goal 5: Pt will have trace fascial restricions in right upper extremity. Long Term Goal 5 Progress: Progressing toward goal  Problem List Patient Active Problem List   Diagnosis Date Noted  . Pain in joint, shoulder region 03/21/2013  . Decreased range of motion of right shoulder 03/21/2013  . Muscle weakness (generalized) 03/21/2013  . Metastatic renal cell  carcinoma   . Pain in thoracic spine 12/09/2011  . DDD (degenerative disc disease), lumbar  12/09/2011  . Right ankle pain 10/28/2011  . Difficulty in walking 10/28/2011  . TINEA PEDIS 04/10/2008  . DIABETES MELLITUS, TYPE II 04/10/2008  . HEADACHE 04/13/2007  . ABNORMAL ELECTROCARDIOGRAM 04/13/2007  . HYPERLIPIDEMIA 09/03/2006  . DEPRESSION 09/03/2006  . HYPERTENSION 09/03/2006  . COPD 09/03/2006  . CONSTIPATION NOS 09/03/2006  . OSTEOARTHRITIS 09/03/2006  . LOW BACK PAIN, CHRONIC 09/03/2006  . BURSITIS, HIPS, BILATERAL 09/03/2006  . FIBROMYALGIA 09/03/2006  . FATIGUE 09/03/2006  . URINARY INCONTINENCE 09/03/2006    End of Session Activity Tolerance: Patient tolerated treatment well General Behavior During Therapy: [facility] for tasks assessed/performed  GO    Bea Graff, MS, OTR/L [phone]  05/09/2013, 11:55 AM

## 2013-05-11 ENCOUNTER — Encounter: Payer: Self-pay | Admitting: Physician Assistant

## 2013-05-11 ENCOUNTER — Ambulatory Visit (INDEPENDENT_AMBULATORY_CARE_PROVIDER_SITE_OTHER): Payer: BC Managed Care – PPO | Admitting: Physician Assistant

## 2013-05-11 VITALS — BP 118/66 | HR 64 | Temp 97.5°F | Resp 18 | Wt 190.0 lb

## 2013-05-11 DIAGNOSIS — I10 Essential (primary) hypertension: Secondary | ICD-10-CM

## 2013-05-11 DIAGNOSIS — E785 Hyperlipidemia, unspecified: Secondary | ICD-10-CM

## 2013-05-11 DIAGNOSIS — E119 Type 2 diabetes mellitus without complications: Secondary | ICD-10-CM
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
## 2013-05-12 ENCOUNTER — Ambulatory Visit (HOSPITAL_COMMUNITY)
Admission: RE | Admit: 2013-05-12 | Discharge: 2013-05-12 | Disposition: A | Discharge status: Home / Self Care | Payer: Worker's Compensation | Source: Ambulatory Visit | Attending: Physician Assistant | Admitting: Physician Assistant

## 2013-05-12 DIAGNOSIS — M25619 Stiffness of unspecified shoulder, not elsewhere classified: Secondary | ICD-10-CM | POA: Diagnosis not present

## 2013-05-12 DIAGNOSIS — IMO0001 Reserved for inherently not codable concepts without codable children: Secondary | ICD-10-CM | POA: Diagnosis present

## 2013-05-12 DIAGNOSIS — M6281 Muscle weakness (generalized): Secondary | ICD-10-CM | POA: Insufficient documentation

## 2013-05-12 DIAGNOSIS — M25519 Pain in unspecified shoulder: Secondary | ICD-10-CM | POA: Insufficient documentation

## 2013-05-12 DIAGNOSIS — I10 Essential (primary) hypertension: Secondary | ICD-10-CM | POA: Diagnosis not present

## 2013-05-12 DIAGNOSIS — E119 Type 2 diabetes mellitus without complications: Secondary | ICD-10-CM | POA: Diagnosis not present

## 2013-05-12 DIAGNOSIS — M25611 Stiffness of right shoulder, not elsewhere classified: Secondary | ICD-10-CM

## 2013-05-12 NOTE — Progress Notes (Signed)
Occupational Therapy Treatment Patient Details  Name: [name] [name] MRN: [id number] Date of Birth: 06/13/1950  Today's Date: 05/12/2013 Time: 6295-2841 OT Time Calculation (min): 39 min MFR 324-401 13' Therex 027-253 26'  Visit#: 55 of 64  Re-eval: 05/23/13    Authorization: [employer] (or workers comp?)  Authorization Time Period: n/a  Authorization Visit#: 13 of    Subjective Symptoms/Limitations Symptoms: S: My shoulder is sore today. I feel like the weather is to blame.  Pain Assessment Currently in Pain?: Yes Pain Score: 4  Pain Location: Shoulder Pain Orientation: Right Pain Type: Acute pain  Precautions/Restrictions  Precautions Precautions: Shoulder Type of Shoulder Precautions: Protocol - scanned  Exercise/Treatments Supine Protraction: PROM;10 reps;AROM;12 reps Horizontal ABduction: PROM;10 reps;AROM;12 reps External Rotation: PROM;10 reps;AROM;12 reps Internal Rotation: PROM;10 reps;AROM;12 reps Flexion: PROM;10 reps;AROM;12 reps ABduction: PROM;10 reps;AROM;12 reps Standing Protraction: AROM;12 reps Horizontal ABduction: AROM;12 reps External Rotation: AROM;12 reps Internal Rotation: AROM;12 reps Flexion: AROM;12 reps ABduction: AROM;12 reps ROM / Strengthening / Isometric Strengthening "W" Arms: 10x X to V Arms: 10x Proximal Shoulder Strengthening, Supine: 10X no rest breaks      Manual Therapy Manual Therapy: Myofascial release Myofascial Release: MFR and manual stretching to right upper arm, scapular, and shoulder region to decrease pain and fascial restrictions improve pain free mobility. Manual cervical traction with MFR to suboccipital region, upper trapezius, and sternocleidomastoid.  Occupational Therapy Assessment and Plan OT Assessment and Plan Clinical Impression Statement: A: Increased reps with AROM supine and standing. Added proximal shoulder strengthening supine and UBE bike. Patient tolerated well.  OT Plan: P: Add proximal shoulder  strengthening standing.    Goals Short Term Goals Short Term Goal 1: Pt will be educated on HEP Short Term Goal 2: Pt will attain right shoulder PROM to [facility] in order to facilitate improved reaching for brushing her teeth Short Term Goal 3: Pt will have right shoulder pain of less than 5/10  Short Term Goal 3 Progress: Progressing toward goal Short Term Goal 4: Pt will have min fascial restrictions in right shoulder and upper arm region Short Term Goal 4 Progress: Progressing toward goal Long Term Goals Long Term Goal 1: Pt will achieve highest level of functioning in all ADL, IADL, work, and leisure tasks with RUE. Long Term Goal 2: Pt will have right shoulder strength of atleast 4+/5 to facilitate improved lifting of her granchildren/great-grandchildren. Long Term Goal 2 Progress: Progressing toward goal Long Term Goal 3: Pt will have right shoulder AROM WNL in order to brush teeth with right hand. Long Term Goal 3 Progress: Progressing toward goal Long Term Goal 4: Pt will have pain of less than 2/10 in right shoulder with daily activities Long Term Goal 4 Progress: Progressing toward goal Long Term Goal 5: Pt will have trace fascial restricions in right upper extremity. Long Term Goal 5 Progress: Progressing toward goal  Problem List Patient Active Problem List   Diagnosis Date Noted  . Pain in joint, shoulder region 03/21/2013  . Decreased range of motion of right shoulder 03/21/2013  . Muscle weakness (generalized) 03/21/2013  . Metastatic renal cell carcinoma   . Pain in thoracic spine 12/09/2011  . DDD (degenerative disc disease), lumbar 12/09/2011  . Right ankle pain 10/28/2011  . Difficulty in walking 10/28/2011  . TINEA PEDIS 04/10/2008  . DIABETES MELLITUS, TYPE II 04/10/2008  . HEADACHE 04/13/2007  . ABNORMAL ELECTROCARDIOGRAM 04/13/2007  . HYPERLIPIDEMIA 09/03/2006  . DEPRESSION 09/03/2006  . HYPERTENSION 09/03/2006  . COPD 09/03/2006  .  CONSTIPATION NOS  09/03/2006  . OSTEOARTHRITIS 09/03/2006  . LOW BACK PAIN, CHRONIC 09/03/2006  . BURSITIS, HIPS, BILATERAL 09/03/2006  . FIBROMYALGIA 09/03/2006  . FATIGUE 09/03/2006  . URINARY INCONTINENCE 09/03/2006    End of Session Activity Tolerance: Patient tolerated treatment well General Behavior During Therapy: [facility] for tasks assessed/performed   Ailene Ravel, OTR/L,CBIS   05/12/2013, 9:28 AM

## 2013-05-16 ENCOUNTER — Ambulatory Visit (HOSPITAL_COMMUNITY)
Admission: RE | Admit: 2013-05-16 | Discharge: 2013-05-16 | Disposition: A | Discharge status: Home / Self Care | Payer: Worker's Compensation | Source: Ambulatory Visit | Attending: Physician Assistant | Admitting: Physician Assistant

## 2013-05-16 DIAGNOSIS — IMO0001 Reserved for inherently not codable concepts without codable children: Secondary | ICD-10-CM | POA: Diagnosis not present

## 2013-05-16 DIAGNOSIS — M25519 Pain in unspecified shoulder: Secondary | ICD-10-CM

## 2013-05-16 DIAGNOSIS — M6281 Muscle weakness (generalized): Secondary | ICD-10-CM

## 2013-05-16 DIAGNOSIS — M25611 Stiffness of right shoulder, not elsewhere classified: Secondary | ICD-10-CM

## 2013-05-16 NOTE — Progress Notes (Signed)
Occupational Therapy Treatment Patient Details  Name: [name] [name] MRN: [id number] Date of Birth: [birth year]  Today's Date: 05/16/2013 Time: 9470-9628 OT Time Calculation (min): 46 min MFR 366-294 12' Therex 765-465 34'  Visit#: 16 of 70  Re-eval: 05/23/13    Authorization: [employer]BS (or workers comp?)  Authorization Time Period: n/a  Authorization Visit#: 14 of    Subjective Symptoms/Limitations Symptoms: S: My shoulder isn't bad today.  Limitations: Shoulder Protocol:Phase III (3/16)- Limited PROM, modalities, wean sling 3-6 weeks, isometrics, supine AAROM, pulleys - Phase IV (3/23) - P/AAROM no limits, 30" isometrics, yellow T-band - Phase V 5 weeks (3/30) - yellow t-band abduc/flex, standing AAROM - Phase VI- 6 weeks (4/6) - Full ROM, UBE - Phase VII (7-12 weeks (4/13-5/18) - strengthening Pain Assessment Currently in Pain?: Yes Pain Score: 4  Pain Location: Shoulder Pain Orientation: Right Pain Type: Acute pain  Precautions/Restrictions  Precautions Precautions: Shoulder Type of Shoulder Precautions: Protocol - scanned  Exercise/Treatments Supine Protraction: PROM;10 reps;AROM;12 reps Horizontal ABduction: PROM;10 reps;AROM;12 reps External Rotation: PROM;10 reps;AROM;12 reps Internal Rotation: PROM;10 reps;AROM;12 reps Flexion: PROM;10 reps;AROM;12 reps ABduction: PROM;10 reps;AROM;12 reps Standing Protraction: AROM;12 reps Horizontal ABduction: AROM;12 reps External Rotation: AROM;12 reps Internal Rotation: AROM;12 reps Flexion: AROM;12 reps ABduction: AROM;12 reps ROM / Strengthening / Isometric Strengthening UBE (Upper Arm Bike): 1.0 3' forward/3' backward "W" Arms: 12X X to V Arms: 12X Proximal Shoulder Strengthening, Supine: 10X no rest breaks Proximal Shoulder Strengthening, Seated: 10X no rest breaks      Manual Therapy Manual Therapy: Myofascial release Myofascial Release: MFR and manual stretching to right upper arm, scapular, and shoulder region to  decrease pain and fascial restrictions improve pain free mobility.  Occupational Therapy Assessment and Plan OT Assessment and Plan Clinical Impression Statement: A: Patient completed all exercises with great form this date. patient was given AROM to add to HEP.  OT Plan: P: Cont with AROM and increase reps to 20. Progress to 1# hand weight next week as able to tolerate.    Goals Short Term Goals Short Term Goal 1: Pt will be educated on HEP Short Term Goal 2: Pt will attain right shoulder PROM to [facility] in order to facilitate improved reaching for brushing her teeth Short Term Goal 3: Pt will have right shoulder pain of less than 5/10  Short Term Goal 3 Progress: Progressing toward goal Short Term Goal 4: Pt will have min fascial restrictions in right shoulder and upper arm region Short Term Goal 4 Progress: Progressing toward goal Long Term Goals Long Term Goal 1: Pt will achieve highest level of functioning in all ADL, IADL, work, and leisure tasks with RUE. Long Term Goal 1 Progress: Progressing toward goal Long Term Goal 2: Pt will have right shoulder strength of atleast 4+/5 to facilitate improved lifting of her granchildren/great-grandchildren. Long Term Goal 2 Progress: Progressing toward goal Long Term Goal 3: Pt will have right shoulder AROM WNL in order to brush teeth with right hand. Long Term Goal 3 Progress: Progressing toward goal Long Term Goal 4: Pt will have pain of less than 2/10 in right shoulder with daily activities Long Term Goal 4 Progress: Progressing toward goal Long Term Goal 5: Pt will have trace fascial restricions in right upper extremity. Long Term Goal 5 Progress: Progressing toward goal  Problem List Patient Active Problem List   Diagnosis Date Noted  . Pain in joint, shoulder region 03/21/2013  . Decreased range of motion of right shoulder 03/21/2013  . Muscle weakness (  generalized) 03/21/2013  . Metastatic renal cell carcinoma   . Pain in thoracic  spine 12/09/2011  . DDD (degenerative disc disease), lumbar 12/09/2011  . Right ankle pain 10/28/2011  . Difficulty in walking 10/28/2011  . TINEA PEDIS 04/10/2008  . DIABETES MELLITUS, TYPE II 04/10/2008  . HEADACHE 04/13/2007  . ABNORMAL ELECTROCARDIOGRAM 04/13/2007  . HYPERLIPIDEMIA 09/03/2006  . DEPRESSION 09/03/2006  . HYPERTENSION 09/03/2006  . COPD 09/03/2006  . CONSTIPATION NOS 09/03/2006  . OSTEOARTHRITIS 09/03/2006  . LOW BACK PAIN, CHRONIC 09/03/2006  . BURSITIS, HIPS, BILATERAL 09/03/2006  . FIBROMYALGIA 09/03/2006  . FATIGUE 09/03/2006  . URINARY INCONTINENCE 09/03/2006    End of Session Activity Tolerance: Patient tolerated treatment well General Behavior During Therapy: [facility] for tasks assessed/performed OT Plan of Care OT Home Exercise Plan: AROM exercises OT Patient Instructions: Handout (scanned) Consulted and Agree with Plan of Care: Patient   Ailene Ravel, OTR/L,CBIS   05/16/2013, 9:12 AM

## 2013-05-19 ENCOUNTER — Ambulatory Visit (HOSPITAL_COMMUNITY)
Admission: RE | Admit: 2013-05-19 | Discharge: 2013-05-19 | Disposition: A | Discharge status: Home / Self Care | Payer: Worker's Compensation | Source: Ambulatory Visit | Attending: Physician Assistant | Admitting: Physician Assistant

## 2013-05-19 DIAGNOSIS — IMO0001 Reserved for inherently not codable concepts without codable children: Secondary | ICD-10-CM | POA: Diagnosis not present

## 2013-05-19 NOTE — Progress Notes (Signed)
Occupational Therapy Treatment Patient Details  Name: [name] [name] MRN: [id number] Date of Birth: 02/07/1950  Today's Date: 05/19/2013 Time: 5329-9242 OT Time Calculation (min): 42 min Manual 848-900 (12') Therapeutic Exercises 900-930 (30')  Visit#: 15 of 77  Re-eval: 05/23/13    Authorization: BC[employer] (or workers comp?)  Authorization Time Period: n/a  Authorization Visit#: 15 of    Subjective Symptoms/Limitations Symptoms: "I just have an aching body today." Limitations: Shoulder Protocol:Phase III (3/16)- Limited PROM, modalities, wean sling 3-6 weeks, isometrics, supine AAROM, pulleys - Phase IV (3/23) - P/AAROM no limits, 30" isometrics, yellow T-band - Phase V 5 weeks (3/30) - yellow t-band abduc/flex, standing AAROM - Phase VI- 6 weeks (4/6) - Full ROM, UBE - Phase VII (7-12 weeks (4/13-5/18) - strengthening Pain Assessment Currently in Pain?: Yes Pain Score: 3  Pain Location: Shoulder Pain Orientation: Right  Precautions/Restrictions     Exercise/Treatments Supine Protraction: PROM;10 reps;AROM;20 reps Horizontal ABduction: PROM;10 reps;AROM;20 reps External Rotation: PROM;10 reps;AROM;20 reps Internal Rotation: PROM;10 reps;AROM;20 reps Flexion: PROM;10 reps;AROM;20 reps ABduction: PROM;10 reps;AROM;20 reps Standing Protraction: AROM;20 reps Horizontal ABduction: AROM;20 reps External Rotation: AROM;20 reps Internal Rotation: AROM;20 reps Flexion: AROM;20 reps ABduction: AROM;20 reps ROM / Strengthening / Isometric Strengthening "W" Arms: 12X X to V Arms: 12X Proximal Shoulder Strengthening, Supine: 12x each with no rest breaks   Manual Therapy Manual Therapy: Myofascial release Myofascial Release: MFR and manual stretching to right upper arm, scapular, and shoulder region to decrease pain and fascial restrictions improve pain free mobility.    Occupational Therapy Assessment and Plan OT Assessment and Plan Clinical Impression Statement: Increased AROM  reps in supine and standing to 20 reps. Pt with min fatigue. but able to complete all.  OT Plan: Progress to 1# hand weight as able to tolerate. Reassess.   Goals Short Term Goals Short Term Goal 1: Pt will be educated on HEP Short Term Goal 1 Progress: Met Short Term Goal 2: Pt will attain right shoulder PROM to [facility] in order to facilitate improved reaching for brushing her teeth Short Term Goal 2 Progress: Met Short Term Goal 3: Pt will have right shoulder pain of less than 5/10  Short Term Goal 3 Progress: Progressing toward goal Short Term Goal 4: Pt will have min fascial restrictions in right shoulder and upper arm region Short Term Goal 4 Progress: Progressing toward goal Long Term Goals Long Term Goal 1: Pt will achieve highest level of functioning in all ADL, IADL, work, and leisure tasks with RUE. Long Term Goal 1 Progress: Progressing toward goal Long Term Goal 2: Pt will have right shoulder strength of atleast 4+/5 to facilitate improved lifting of her granchildren/great-grandchildren. Long Term Goal 2 Progress: Progressing toward goal Long Term Goal 3: Pt will have right shoulder AROM WNL in order to brush teeth with right hand. Long Term Goal 3 Progress: Progressing toward goal Long Term Goal 4: Pt will have pain of less than 2/10 in right shoulder with daily activities Long Term Goal 4 Progress: Progressing toward goal Long Term Goal 5: Pt will have trace fascial restricions in right upper extremity. Long Term Goal 5 Progress: Progressing toward goal  Problem List Patient Active Problem List   Diagnosis Date Noted  . Pain in joint, shoulder region 03/21/2013  . Decreased range of motion of right shoulder 03/21/2013  . Muscle weakness (generalized) 03/21/2013  . Metastatic renal cell carcinoma   . Pain in thoracic spine 12/09/2011  . DDD (degenerative disc disease), lumbar 12/09/2011  .  Right ankle pain 10/28/2011  . Difficulty in walking 10/28/2011  . TINEA PEDIS  04/10/2008  . DIABETES MELLITUS, TYPE II 04/10/2008  . HEADACHE 04/13/2007  . ABNORMAL ELECTROCARDIOGRAM 04/13/2007  . HYPERLIPIDEMIA 09/03/2006  . DEPRESSION 09/03/2006  . HYPERTENSION 09/03/2006  . COPD 09/03/2006  . CONSTIPATION NOS 09/03/2006  . OSTEOARTHRITIS 09/03/2006  . LOW BACK PAIN, CHRONIC 09/03/2006  . BURSITIS, HIPS, BILATERAL 09/03/2006  . FIBROMYALGIA 09/03/2006  . FATIGUE 09/03/2006  . URINARY INCONTINENCE 09/03/2006    End of Session Activity Tolerance: Patient tolerated treatment well General Behavior During Therapy: [facility] for tasks assessed/performed  GO    Bea Graff, [place], OTR/L [phone]  05/19/2013, 11:07 AM

## 2013-05-23 ENCOUNTER — Ambulatory Visit (HOSPITAL_COMMUNITY): Payer: Self-pay

## 2013-05-24 ENCOUNTER — Ambulatory Visit (HOSPITAL_COMMUNITY)
Admission: RE | Admit: 2013-05-24 | Discharge: 2013-05-24 | Disposition: A | Discharge status: Home / Self Care | Payer: Worker's Compensation | Source: Ambulatory Visit | Attending: Physician Assistant | Admitting: Physician Assistant

## 2013-05-24 DIAGNOSIS — IMO0001 Reserved for inherently not codable concepts without codable children: Secondary | ICD-10-CM | POA: Diagnosis not present

## 2013-05-24 DIAGNOSIS — M25611 Stiffness of right shoulder, not elsewhere classified: Secondary | ICD-10-CM

## 2013-05-24 DIAGNOSIS — M6281 Muscle weakness (generalized): Secondary | ICD-10-CM

## 2013-05-24 DIAGNOSIS — M25519 Pain in unspecified shoulder: Secondary | ICD-10-CM
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
## 2013-05-25 ENCOUNTER — Ambulatory Visit (HOSPITAL_COMMUNITY): Payer: Self-pay | Admitting: Specialist

## 2013-05-25 ENCOUNTER — Telehealth (HOSPITAL_COMMUNITY): Payer: Self-pay

## 2013-05-30 ENCOUNTER — Ambulatory Visit (HOSPITAL_COMMUNITY)
Admission: RE | Admit: 2013-05-30 | Discharge: 2013-05-30 | Disposition: A | Discharge status: Home / Self Care | Payer: Worker's Compensation | Source: Ambulatory Visit

## 2013-05-30 DIAGNOSIS — IMO0001 Reserved for inherently not codable concepts without codable children: Secondary | ICD-10-CM | POA: Diagnosis not present

## 2013-05-30 DIAGNOSIS — M25519 Pain in unspecified shoulder: Secondary | ICD-10-CM

## 2013-05-30 DIAGNOSIS — M25611 Stiffness of right shoulder, not elsewhere classified: Secondary | ICD-10-CM

## 2013-05-30 DIAGNOSIS — M6281 Muscle weakness (generalized): Secondary | ICD-10-CM

## 2013-05-30 NOTE — Progress Notes (Signed)
Occupational Therapy Treatment Patient Details  Name: [name] [name] MRN: [id number] Date of Birth: Aug 17, 1950  Today's Date: 05/30/2013 Time: 5956-3875 OT Time Calculation (min): 37 min MFR 643-329 8' Therex 518-8416 29'  Visit#: 38 of 105  Re-eval: 06/21/13    Authorization: BCBS (or workers comp?)  Authorization Time Period: n/a  Authorization Visit#: 17 of    Subjective Symptoms/Limitations Symptoms: S: I fell Tuesday bending over to pick something up off of the ground. I fell and plowed the ground with my head. I'm really sore still. I fell on my arm too.  Pain Assessment Currently in Pain?: Yes Pain Score: 5  Pain Location: Shoulder Pain Orientation: Right Pain Type: Acute pain  Precautions/Restrictions  Precautions Precautions: Shoulder Type of Shoulder Precautions: Protocol - scanned  Exercise/Treatments Supine Protraction: PROM;Strengthening;Weights;10 reps Protraction Weight (lbs): 1 Horizontal ABduction: PROM;10 reps;AROM External Rotation: PROM;Strengthening;Weights;10 reps External Rotation Weight (lbs): 1 Internal Rotation: PROM;Strengthening;Weights;10 reps Internal Rotation Weight (lbs): 1 Flexion: PROM;Strengthening;Weights;10 reps Shoulder Flexion Weight (lbs): 1 ABduction: PROM;10 reps;AROM Standing Protraction: Strengthening;Weights;10 reps Protraction Weight (lbs): 1 Horizontal ABduction: AROM;20 reps External Rotation: Strengthening;Weights;10 reps External Rotation Weight (lbs): 1 Internal Rotation: Strengthening;Weights;10 reps Internal Rotation Weight (lbs): 1 Flexion: Strengthening;Weights;10 reps Shoulder Flexion Weight (lbs): 1 ABduction: AROM;20 reps ROM / Strengthening / Isometric Strengthening UBE (Upper Arm Bike): 1.0 3' forward/3' backward "W" Arms: 12X X to V Arms: 12X Proximal Shoulder Strengthening, Supine: 10X with 1# Proximal Shoulder Strengthening, Seated: 10X 3 rest breaks       Manual Therapy Manual Therapy:  Myofascial release Myofascial Release: MFR and manual stretching to right upper arm, scapular, and shoulder region to decrease pain and fascial restrictions improve pain free mobility.  Occupational Therapy Assessment and Plan OT Assessment and Plan Clinical Impression Statement: A: Patient unable to use 1# for all strengthening exercises due to pain from fall. Did not add ball on the wall this date due to increased msucle soreness/pain. Will attempt next time.  OT Plan: P: Complete all strengthening exercises with 1#, ball on the wall.    Goals Short Term Goals Time to Complete Short Term Goals: 6 weeks Short Term Goal 1: Pt will be educated on HEP Short Term Goal 2: Pt will attain right shoulder PROM to [facility] in order to facilitate improved reaching for brushing her teeth Short Term Goal 3: Pt will have right shoulder pain of less than 5/10  Short Term Goal 4: Pt will have min fascial restrictions in right shoulder and upper arm region Long Term Goals Time to Complete Long Term Goals: 12 weeks Long Term Goal 1: Pt will achieve highest level of functioning in all ADL, IADL, work, and leisure tasks with RUE. Long Term Goal 1 Progress: Progressing toward goal Long Term Goal 2: Pt will have right shoulder strength of at least 4+/5 to facilitate improved lifting of her granchildren/great-grandchildren. Long Term Goal 2 Progress: Progressing toward goal Long Term Goal 3: Pt will have right shoulder AROM WNL in order to brush teeth with right hand. Long Term Goal 4: Pt will have pain of less than 2/10 in right shoulder with daily activities Long Term Goal 4 Progress: Progressing toward goal Long Term Goal 5: Pt will have trace fascial restricions in right upper extremity.  Problem List Patient Active Problem List   Diagnosis Date Noted  . Pain in joint, shoulder region 03/21/2013  . Decreased range of motion of right shoulder 03/21/2013  . Muscle weakness (generalized) 03/21/2013  .  Metastatic renal cell  carcinoma   . Pain in thoracic spine 12/09/2011  . DDD (degenerative disc disease), lumbar 12/09/2011  . Right ankle pain 10/28/2011  . Difficulty in walking 10/28/2011  . TINEA PEDIS 04/10/2008  . DIABETES MELLITUS, TYPE II 04/10/2008  . HEADACHE 04/13/2007  . ABNORMAL ELECTROCARDIOGRAM 04/13/2007  . HYPERLIPIDEMIA 09/03/2006  . DEPRESSION 09/03/2006  . HYPERTENSION 09/03/2006  . COPD 09/03/2006  . CONSTIPATION NOS 09/03/2006  . OSTEOARTHRITIS 09/03/2006  . LOW BACK PAIN, CHRONIC 09/03/2006  . BURSITIS, HIPS, BILATERAL 09/03/2006  . FIBROMYALGIA 09/03/2006  . FATIGUE 09/03/2006  . URINARY INCONTINENCE 09/03/2006    End of Session Activity Tolerance: Patient tolerated treatment well General Behavior During Therapy: [facility] for tasks assessed/performed   Ailene Ravel, OTR/L,CBIS   05/30/2013, 10:12 AM

## 2013-06-02 ENCOUNTER — Ambulatory Visit (HOSPITAL_COMMUNITY)
Admission: RE | Admit: 2013-06-02 | Discharge: 2013-06-02 | Disposition: A | Discharge status: Home / Self Care | Payer: Worker's Compensation | Source: Ambulatory Visit | Attending: Physician Assistant | Admitting: Physician Assistant

## 2013-06-02 DIAGNOSIS — IMO0001 Reserved for inherently not codable concepts without codable children: Secondary | ICD-10-CM | POA: Diagnosis not present

## 2013-06-02 DIAGNOSIS — M25611 Stiffness of right shoulder, not elsewhere classified: Secondary | ICD-10-CM

## 2013-06-02 DIAGNOSIS — M25519 Pain in unspecified shoulder: Secondary | ICD-10-CM

## 2013-06-02 DIAGNOSIS — M6281 Muscle weakness (generalized): Secondary | ICD-10-CM

## 2013-06-02 NOTE — Progress Notes (Signed)
Occupational Therapy Treatment Patient Details  Name: LITZI BINNING MRN: [id number] Date of Birth: 01/01/1951  Today's Date: 06/02/2013 Time: 1884-1660 OT Time Calculation (min): 50 min Manual therapy 850-907 17' Therapeutic exercises 907-930 23' Moist heat 10' Visit#: 66 of 36  Re-eval: 06/21/13    Authorization: [employer]BS (or workers comp?)  Authorization Time Period:    Authorization Visit#:   of    Subjective S:  I feel alot better than last week. Limitations: Shoulder Protocol:Phase III (3/16)- Limited PROM, modalities, wean sling 3-6 weeks, isometrics, supine AAROM, pulleys - Phase IV (3/23) - P/AAROM no limits, 30" isometrics, yellow T-band - Phase V 5 weeks (3/30) - yellow t-band abduc/flex, standing AAROM - Phase VI- 6 weeks (4/6) - Full ROM, UBE - Phase VII (7-12 weeks (4/13-5/18) - strengthening Pain Assessment Currently in Pain?: Yes Pain Score: 3  Pain Location: Shoulder Pain Orientation: Right Pain Type: Acute pain  Precautions/Restrictions   progress as tolerated  Exercise/Treatments Supine Protraction: PROM;10 reps;Strengthening;12 reps Protraction Weight (lbs): 1 Horizontal ABduction: PROM;10 reps;Strengthening;12 reps Horizontal ABduction Weight (lbs): 1 External Rotation: PROM;10 reps;Strengthening;12 reps External Rotation Weight (lbs): 1 Internal Rotation: PROM;10 reps;Strengthening;12 reps Internal Rotation Weight (lbs): 1 Flexion: PROM;10 reps;Strengthening;12 reps Shoulder Flexion Weight (lbs): 1 ABduction: PROM;10 reps;Strengthening;12 reps Shoulder ABduction Weight (lbs): 1 ROM / Strengthening / Isometric Strengthening UBE (Upper Arm Bike): resume next visit Wall Wash: 2' "W" Arms: 10 X with 1# X to V Arms: 10X with 1# Proximal Shoulder Strengthening, Supine: 10 X 1# Proximal Shoulder Strengthening, Seated: 10X 1#         Modalities Modalities: Moist Heat Manual Therapy Manual Therapy: Myofascial release Myofascial Release: MFR and  manual stretching to right upper arm, scapular, and shoulder region to decrease pain and fascial restrictions improve pain free mobility. Moist Heat Therapy Number Minutes Moist Heat: 10 Minutes Moist Heat Location: Shoulder (right, supine position)  Occupational Therapy Assessment and Plan OT Assessment and Plan Clinical Impression Statement: A:  Patient able to use 1# for all strengthening exercises this date, including x to v and w arms scapular stability exercises.  OT Plan: P:  Add weight to wall wash, add ball on wall and attempt cybex press and row.   Goals Short Term Goals Time to Complete Short Term Goals: 6 weeks Short Term Goal 1: Pt will be educated on HEP Short Term Goal 2: Pt will attain right shoulder PROM to [facility] in order to facilitate improved reaching for brushing her teeth Short Term Goal 3: Pt will have right shoulder pain of less than 5/10  Short Term Goal 4: Pt will have min fascial restrictions in right shoulder and upper arm region Long Term Goals Time to Complete Long Term Goals: 12 weeks Long Term Goal 1: Pt will achieve highest level of functioning in all ADL, IADL, work, and leisure tasks with RUE. Long Term Goal 2: Pt will have right shoulder strength of at least 4+/5 to facilitate improved lifting of her granchildren/great-grandchildren. Long Term Goal 3: Pt will have right shoulder AROM WNL in order to brush teeth with right hand. Long Term Goal 4: Pt will have pain of less than 2/10 in right shoulder with daily activities Long Term Goal 5: Pt will have trace fascial restricions in right upper extremity.  Problem List Patient Active Problem List   Diagnosis Date Noted  . Pain in joint, shoulder region 03/21/2013  . Decreased range of motion of right shoulder 03/21/2013  . Muscle weakness (generalized) 03/21/2013  .  Metastatic renal cell carcinoma   . Pain in thoracic spine 12/09/2011  . DDD (degenerative disc disease), lumbar 12/09/2011  . Right ankle  pain 10/28/2011  . Difficulty in walking 10/28/2011  . TINEA PEDIS 04/10/2008  . DIABETES MELLITUS, TYPE II 04/10/2008  . HEADACHE 04/13/2007  . ABNORMAL ELECTROCARDIOGRAM 04/13/2007  . HYPERLIPIDEMIA 09/03/2006  . DEPRESSION 09/03/2006  . HYPERTENSION 09/03/2006  . COPD 09/03/2006  . CONSTIPATION NOS 09/03/2006  . OSTEOARTHRITIS 09/03/2006  . LOW BACK PAIN, CHRONIC 09/03/2006  . BURSITIS, HIPS, BILATERAL 09/03/2006  . FIBROMYALGIA 09/03/2006  . FATIGUE 09/03/2006  . URINARY INCONTINENCE 09/03/2006    End of Session Activity Tolerance: Patient tolerated treatment well General Behavior During Therapy: [facility] for tasks assessed/performed  GO    Vangie Bicker, OTR/L [phone]  06/02/2013, 9:36 AM

## 2013-06-06 ENCOUNTER — Ambulatory Visit (HOSPITAL_COMMUNITY)
Admission: RE | Admit: 2013-06-06 | Discharge: 2013-06-06 | Disposition: A | Discharge status: Home / Self Care | Payer: Worker's Compensation | Source: Ambulatory Visit | Attending: Physician Assistant | Admitting: Physician Assistant

## 2013-06-06 DIAGNOSIS — M6281 Muscle weakness (generalized): Secondary | ICD-10-CM

## 2013-06-06 DIAGNOSIS — M25519 Pain in unspecified shoulder: Secondary | ICD-10-CM

## 2013-06-06 DIAGNOSIS — M25611 Stiffness of right shoulder, not elsewhere classified: Secondary | ICD-10-CM

## 2013-06-06 DIAGNOSIS — IMO0001 Reserved for inherently not codable concepts without codable children: Secondary | ICD-10-CM | POA: Diagnosis not present

## 2013-06-06 NOTE — Progress Notes (Signed)
Occupational Therapy Treatment Patient Details  Name: SUZIE VANDAM MRN: [id number] Date of Birth: 1951-01-12  Today's Date: 06/06/2013 Time: 2536-6440 OT Time Calculation (min): 51 min MFR 3474-2595  11' Therex 6387-5643 30' Heat 1145-1155 10'  Visit#: 19 of 36  Re-eval: 06/21/13    Authorization: [employer] (or workers comp?)  Authorization Time Period:    Authorization Visit#:   of    Subjective Symptoms/Limitations Symptoms: S: My shoulder is feeling better this week. It didn't help that I fell last week. Pain Assessment Currently in Pain?: Yes Pain Score: 3  Pain Location: Shoulder Pain Orientation: Right Pain Type: Acute pain  Precautions/Restrictions  Precautions Precautions: Shoulder Type of Shoulder Precautions: Protocol - scanned  Exercise/Treatments Supine Protraction: PROM;10 reps;Strengthening;12 reps Protraction Weight (lbs): 1 Horizontal ABduction: PROM;10 reps;Strengthening;12 reps Horizontal ABduction Weight (lbs): 1 External Rotation: PROM;10 reps;Strengthening;12 reps External Rotation Weight (lbs): 1 Internal Rotation: PROM;10 reps;Strengthening;12 reps Internal Rotation Weight (lbs): 1 Flexion: PROM;10 reps;Strengthening;12 reps Shoulder Flexion Weight (lbs): 1 ABduction: PROM;10 reps;Strengthening;12 reps Shoulder ABduction Weight (lbs): 1 Standing Protraction: Strengthening;Weights;12 reps Protraction Weight (lbs): 1 External Rotation: Strengthening;Weights;12 reps External Rotation Weight (lbs): 1 Internal Rotation: Strengthening;Weights;12 reps Internal Rotation Weight (lbs): 1 Flexion: Strengthening;Weights;12 reps Shoulder Flexion Weight (lbs): 1 ABduction: Strengthening;Weights;12 reps Shoulder ABduction Weight (lbs): 1 ROM / Strengthening / Isometric Strengthening UBE (Upper Arm Bike): resume next visit Cybex Press: 1 plate (32R) Cybex Row: 1 plate (51O) Wall Wash: 2' "W" Arms: 10X X to V Arms: 10X with 1# Proximal Shoulder  Strengthening, Supine: 12X with 1# Proximal Shoulder Strengthening, Seated: 12X 1# Ball on Wall: 1' flexion 1' abduction green       Modalities Modalities: Moist Heat Manual Therapy Manual Therapy: Myofascial release Myofascial Release: MFR and manual stretching to right upper arm, scapular, and shoulder region to decrease pain and fascial restrictions improve pain free mobility. Moist Heat Therapy Number Minutes Moist Heat: 10 Minutes Moist Heat Location: Shoulder  Occupational Therapy Assessment and Plan OT Assessment and Plan Clinical Impression Statement: A: Added ball on the wall, cybex press/row. patient tolerated well. Requested moist heat at end of session.  OT Plan: P: Resume missed exercises.    Goals Short Term Goals Time to Complete Short Term Goals: 6 weeks Short Term Goal 1: Pt will be educated on HEP Short Term Goal 2: Pt will attain right shoulder PROM to [facility] in order to facilitate improved reaching for brushing her teeth Short Term Goal 3: Pt will have right shoulder pain of less than 5/10  Short Term Goal 4: Pt will have min fascial restrictions in right shoulder and upper arm region Long Term Goals Time to Complete Long Term Goals: 12 weeks Long Term Goal 1: Pt will achieve highest level of functioning in all ADL, IADL, work, and leisure tasks with RUE. Long Term Goal 1 Progress: Progressing toward goal Long Term Goal 2: Pt will have right shoulder strength of at least 4+/5 to facilitate improved lifting of her granchildren/great-grandchildren. Long Term Goal 2 Progress: Progressing toward goal Long Term Goal 3: Pt will have right shoulder AROM WNL in order to brush teeth with right hand. Long Term Goal 4: Pt will have pain of less than 2/10 in right shoulder with daily activities Long Term Goal 4 Progress: Progressing toward goal Long Term Goal 5: Pt will have trace fascial restricions in right upper extremity.  Problem List Patient Active Problem List    Diagnosis Date Noted  . Pain in joint, shoulder region 03/21/2013  .  Decreased range of motion of right shoulder 03/21/2013  . Muscle weakness (generalized) 03/21/2013  . Metastatic renal cell carcinoma   . Pain in thoracic spine 12/09/2011  . DDD (degenerative disc disease), lumbar 12/09/2011  . Right ankle pain 10/28/2011  . Difficulty in walking 10/28/2011  . TINEA PEDIS 04/10/2008  . DIABETES MELLITUS, TYPE II 04/10/2008  . HEADACHE 04/13/2007  . ABNORMAL ELECTROCARDIOGRAM 04/13/2007  . HYPERLIPIDEMIA 09/03/2006  . DEPRESSION 09/03/2006  . HYPERTENSION 09/03/2006  . COPD 09/03/2006  . CONSTIPATION NOS 09/03/2006  . OSTEOARTHRITIS 09/03/2006  . LOW BACK PAIN, CHRONIC 09/03/2006  . BURSITIS, HIPS, BILATERAL 09/03/2006  . FIBROMYALGIA 09/03/2006  . FATIGUE 09/03/2006  . URINARY INCONTINENCE 09/03/2006    End of Session Activity Tolerance: Patient tolerated treatment well General Behavior During Therapy: [facility] for tasks assessed/performed   Ailene Ravel, OTR/L,CBIS   06/06/2013, 11:48 AM

## 2013-06-09 ENCOUNTER — Other Ambulatory Visit (HOSPITAL_COMMUNITY): Payer: Self-pay | Admitting: Oncology

## 2013-06-09 ENCOUNTER — Ambulatory Visit (HOSPITAL_COMMUNITY)
Admission: RE | Admit: 2013-06-09 | Discharge: 2013-06-09 | Disposition: A | Discharge status: Home / Self Care | Payer: Worker's Compensation | Source: Ambulatory Visit | Attending: Physician Assistant | Admitting: Physician Assistant

## 2013-06-09 DIAGNOSIS — C649 Malignant neoplasm of unspecified kidney, except renal pelvis: Secondary | ICD-10-CM

## 2013-06-09 DIAGNOSIS — IMO0001 Reserved for inherently not codable concepts without codable children: Secondary | ICD-10-CM | POA: Diagnosis not present

## 2013-06-09 MED ORDER — PAZOPANIB HCL 200 MG PO TABS: 800.0000 mg | ORAL_TABLET | Freq: Every day | ORAL | Status: DC

## 2013-06-09 NOTE — Progress Notes (Signed)
Occupational Therapy Treatment Patient Details  Name: [name] [name] MRN: [id number] Date of Birth: [birth year]  Today's Date: 06/09/2013 Time: 8563-1497 OT Time Calculation (min): 53 min Manual therapy [phone] 17' Therapeutic exercises [phone] 46'  Visit#: 20 of 36  Re-eval: 06/21/13    Authorization: BCBS (or workers comp?)  Authorization Time Period:    Authorization Visit#: 20 of    Subjective S:  Its better than it was a week ago.  I use it more, not really with weights. Limitations: Shoulder Protocol:Phase III (3/16)- Limited PROM, modalities, wean sling 3-6 weeks, isometrics, supine AAROM, pulleys - Phase IV (3/23) - P/AAROM no limits, 30" isometrics, yellow T-band - Phase V 5 weeks (3/30) - yellow t-band abduc/flex, standing AAROM - Phase VI- 6 weeks (4/6) - Full ROM, UBE - Phase VII (7-12 weeks (4/13-5/18) - strengthening Pain Assessment Currently in Pain?: Yes Pain Score: 2  Pain Location: Shoulder Pain Orientation: Right Pain Type: Acute pain  Precautions/Restrictions   progress as tolerated  Exercise/Treatments Supine Protraction: PROM;10 reps;Strengthening;12 reps Protraction Weight (lbs): 1 Horizontal ABduction: PROM;10 reps;Strengthening;12 reps Horizontal ABduction Weight (lbs): 1 External Rotation: PROM;10 reps;Strengthening;12 reps External Rotation Weight (lbs): 1 Internal Rotation: PROM;10 reps;Strengthening;12 reps Internal Rotation Weight (lbs): 1 Flexion: PROM;10 reps;Strengthening;12 reps Shoulder Flexion Weight (lbs): 1 ABduction: PROM;10 reps;Strengthening;12 reps Shoulder ABduction Weight (lbs): 1 Seated Protraction: Strengthening;12 reps Protraction Weight (lbs): 1 Horizontal ABduction: Strengthening;12 reps Horizontal ABduction Weight (lbs): 1 External Rotation: Strengthening;12 reps External Rotation Weight (lbs): 1 Internal Rotation: Strengthening;12 reps Internal Rotation Weight (lbs): 1 Flexion: Strengthening;12 reps Flexion Weight  (lbs): 1 Abduction: Strengthening;12 reps ABduction Weight (lbs): 1 ROM / Strengthening / Isometric Strengthening UBE (Upper Arm Bike): 3' and 3' at 3.0 Wall Wash: 2' with 1# "W" Arms: 12 times with 1# X to V Arms: 12 times with 1# Proximal Shoulder Strengthening, Supine: 12X with 1# Proximal Shoulder Strengthening, Seated: 12X 1#      Manual Therapy Manual Therapy: Myofascial release Myofascial Release: MFR and manual stretching to right upper arm, scapular, and shoulder region to decrease pain and fascial restrictions improve pain free mobility.  Occupational Therapy Assessment and Plan OT Assessment and Plan Clinical Impression Statement: A:  Added weight to w arms and wall wash for improved scapular stability. OT Plan: P:  Attempt 2# in supine, add overhead lace.   Goals Short Term Goals Time to Complete Short Term Goals: 6 weeks Short Term Goal 1: Pt will be educated on HEP Short Term Goal 2: Pt will attain right shoulder PROM to [facility] in order to facilitate improved reaching for brushing her teeth Short Term Goal 3: Pt will have right shoulder pain of less than 5/10  Short Term Goal 4: Pt will have min fascial restrictions in right shoulder and upper arm region Long Term Goals Time to Complete Long Term Goals: 12 weeks Long Term Goal 1: Pt will achieve highest level of functioning in all ADL, IADL, work, and leisure tasks with RUE. Long Term Goal 2: Pt will have right shoulder strength of at least 4+/5 to facilitate improved lifting of her granchildren/great-grandchildren. Long Term Goal 3: Pt will have right shoulder AROM WNL in order to brush teeth with right hand. Long Term Goal 4: Pt will have pain of less than 2/10 in right shoulder with daily activities Long Term Goal 5: Pt will have trace fascial restricions in right upper extremity.  Problem List Patient Active Problem List   Diagnosis Date Noted  . Pain in joint, shoulder  region 03/21/2013  . Decreased range of  motion of right shoulder 03/21/2013  . Muscle weakness (generalized) 03/21/2013  . Metastatic renal cell carcinoma   . Pain in thoracic spine 12/09/2011  . DDD (degenerative disc disease), lumbar 12/09/2011  . Right ankle pain 10/28/2011  . Difficulty in walking 10/28/2011  . TINEA PEDIS 04/10/2008  . DIABETES MELLITUS, TYPE II 04/10/2008  . HEADACHE 04/13/2007  . ABNORMAL ELECTROCARDIOGRAM 04/13/2007  . HYPERLIPIDEMIA 09/03/2006  . DEPRESSION 09/03/2006  . HYPERTENSION 09/03/2006  . COPD 09/03/2006  . CONSTIPATION NOS 09/03/2006  . OSTEOARTHRITIS 09/03/2006  . LOW BACK PAIN, CHRONIC 09/03/2006  . BURSITIS, HIPS, BILATERAL 09/03/2006  . FIBROMYALGIA 09/03/2006  . FATIGUE 09/03/2006  . URINARY INCONTINENCE 09/03/2006    End of Session Activity Tolerance: Patient tolerated treatment well General Behavior During Therapy: [facility] for tasks assessed/performed  GO    Vangie Bicker, OTR/L [phone]  06/09/2013, 10:36 AM

## 2013-06-13 ENCOUNTER — Ambulatory Visit (HOSPITAL_COMMUNITY): Payer: Self-pay

## 2013-06-13 ENCOUNTER — Telehealth (HOSPITAL_COMMUNITY): Payer: Self-pay

## 2013-06-13 ENCOUNTER — Telehealth (HOSPITAL_COMMUNITY): Payer: Self-pay | Admitting: *Deleted

## 2013-06-16 ENCOUNTER — Ambulatory Visit (HOSPITAL_COMMUNITY)
Admission: RE | Admit: 2013-06-16 | Discharge: 2013-06-16 | Disposition: A | Discharge status: Home / Self Care | Payer: Worker's Compensation | Source: Ambulatory Visit | Attending: Physician Assistant | Admitting: Physician Assistant

## 2013-06-16 DIAGNOSIS — E119 Type 2 diabetes mellitus without complications: Secondary | ICD-10-CM | POA: Diagnosis not present

## 2013-06-16 DIAGNOSIS — IMO0001 Reserved for inherently not codable concepts without codable children: Secondary | ICD-10-CM | POA: Insufficient documentation

## 2013-06-16 DIAGNOSIS — M25619 Stiffness of unspecified shoulder, not elsewhere classified: Secondary | ICD-10-CM | POA: Insufficient documentation

## 2013-06-16 DIAGNOSIS — M6281 Muscle weakness (generalized): Secondary | ICD-10-CM | POA: Diagnosis not present

## 2013-06-16 DIAGNOSIS — M25519 Pain in unspecified shoulder: Secondary | ICD-10-CM | POA: Insufficient documentation

## 2013-06-16 DIAGNOSIS — I10 Essential (primary) hypertension: Secondary | ICD-10-CM | POA: Insufficient documentation

## 2013-06-16 NOTE — Progress Notes (Signed)
Occupational Therapy Treatment Patient Details  Name: [name] [name] MRN: [id number] Date of Birth: 1950/10/05  Today's Date: 06/16/2013 Time: 1601-0932 OT Time Calculation (min): 41 min Manual 3557-3220 (12') Therapeutic Exercises 1118-1147 (29')  Visit#: 21 of 14  Re-eval: 06/21/13    Authorization: BCBS (or workers comp?)  Authorization Time Period: n/a  Authorization Visit#: 21 of    Subjective Symptoms/Limitations Symptoms: "Its the cancer. Its just making me so mauseous." Limitations: Shoulder Protocol:Phase III (3/16)- Limited PROM, modalities, wean sling 3-6 weeks, isometrics, supine AAROM, pulleys - Phase IV (3/23) - P/AAROM no limits, 30" isometrics, yellow T-band - Phase V 5 weeks (3/30) - yellow t-band abduc/flex, standing AAROM - Phase VI- 6 weeks (4/6) - Full ROM, UBE - Phase VII (7-12 weeks (4/13-5/18) - strengthening Pain Assessment Currently in Pain?: Yes Pain Score: 2  Pain Location: Shoulder Pain Orientation: Right Pain Type: Acute pain  Exercise/Treatments Supine Protraction: PROM;Strengthening;10 reps Protraction Weight (lbs): 2 Horizontal ABduction: PROM;Strengthening;10 reps Horizontal ABduction Weight (lbs): 2 External Rotation: PROM;Strengthening;10 reps External Rotation Weight (lbs): 2 Internal Rotation: PROM;Strengthening;10 reps Internal Rotation Weight (lbs): 2 Flexion: PROM;Strengthening;10 reps Shoulder Flexion Weight (lbs): 2 ABduction: PROM;10 reps;Strengthening Shoulder ABduction Weight (lbs): 2 Seated Protraction: Strengthening;12 reps Protraction Weight (lbs): 1 Horizontal ABduction: Strengthening;12 reps Horizontal ABduction Weight (lbs): 1 External Rotation: Strengthening;12 reps External Rotation Weight (lbs): 1 Internal Rotation: Strengthening;12 reps Internal Rotation Weight (lbs): 1 Flexion: Strengthening;12 reps Flexion Weight (lbs): 1 Abduction: Strengthening;12 reps ABduction Weight (lbs): 1   ROM / Strengthening /  Isometric Strengthening Over Head Lace: 2 min "W" Arms: 12 times with 1# X to V Arms: 12 times with 1# Proximal Shoulder Strengthening, Supine: 10x with 2# Proximal Shoulder Strengthening, Seated: 12x with 1# (2 rest breaks)   Manual Therapy Manual Therapy: Myofascial release Myofascial Release: MFR and manual stretching to right upper arm, scapular, and shoulder region to decrease pain and fascial restrictions improve pain free mobility.    Occupational Therapy Assessment and Plan OT Assessment and Plan Clinical Impression Statement: Pt presenting with increase nausea this session due to her cancer diagnosis - pt verbalized ability to continue with session as planned. Increase supine weights to 2# with session with some increased fatigue but good tolerance. Added overhead lace this session - pt had good tolerance of 2 min. OT Plan: Increase sitting strengthening reps.  Ball on wall.   Goals Short Term Goals Short Term Goal 1: Pt will be educated on HEP Short Term Goal 1 Progress: Met Short Term Goal 2: Pt will attain right shoulder PROM to [facility] in order to facilitate improved reaching for brushing her teeth Short Term Goal 2 Progress: Met Short Term Goal 3: Pt will have right shoulder pain of less than 5/10  Short Term Goal 3 Progress: Met Short Term Goal 4: Pt will have min fascial restrictions in right shoulder and upper arm region Short Term Goal 4 Progress: Met Long Term Goals Long Term Goal 1: Pt will achieve highest level of functioning in all ADL, IADL, work, and leisure tasks with RUE. Long Term Goal 1 Progress: Progressing toward goal Long Term Goal 2: Pt will have right shoulder strength of at least 4+/5 to facilitate improved lifting of her granchildren/great-grandchildren. Long Term Goal 2 Progress: Progressing toward goal Long Term Goal 3: Pt will have right shoulder AROM WNL in order to brush teeth with right hand. Long Term Goal 3 Progress: Met Long Term Goal 4: Pt  will have pain of less than 2/10  in right shoulder with daily activities Long Term Goal 4 Progress: Progressing toward goal Long Term Goal 5: Pt will have trace fascial restricions in right upper extremity. Long Term Goal 5 Progress: Met  Problem List Patient Active Problem List   Diagnosis Date Noted  . Pain in joint, shoulder region 03/21/2013  . Decreased range of motion of right shoulder 03/21/2013  . Muscle weakness (generalized) 03/21/2013  . Metastatic renal cell carcinoma   . Pain in thoracic spine 12/09/2011  . DDD (degenerative disc disease), lumbar 12/09/2011  . Right ankle pain 10/28/2011  . Difficulty in walking 10/28/2011  . TINEA PEDIS 04/10/2008  . DIABETES MELLITUS, TYPE II 04/10/2008  . HEADACHE 04/13/2007  . ABNORMAL ELECTROCARDIOGRAM 04/13/2007  . HYPERLIPIDEMIA 09/03/2006  . DEPRESSION 09/03/2006  . HYPERTENSION 09/03/2006  . COPD 09/03/2006  . CONSTIPATION NOS 09/03/2006  . OSTEOARTHRITIS 09/03/2006  . LOW BACK PAIN, CHRONIC 09/03/2006  . BURSITIS, HIPS, BILATERAL 09/03/2006  . FIBROMYALGIA 09/03/2006  . FATIGUE 09/03/2006  . URINARY INCONTINENCE 09/03/2006    End of Session Activity Tolerance: Patient tolerated treatment well General Behavior During Therapy: [facility] for tasks assessed/performed  GO    Bea Graff, MS, OTR/L [phone]  06/16/2013, 11:53 AM

## 2013-06-20 ENCOUNTER — Ambulatory Visit (HOSPITAL_COMMUNITY)
Admission: RE | Admit: 2013-06-20 | Discharge: 2013-06-20 | Disposition: A | Discharge status: Home / Self Care | Payer: Worker's Compensation | Source: Ambulatory Visit | Attending: Physician Assistant | Admitting: Physician Assistant

## 2013-06-20 DIAGNOSIS — IMO0001 Reserved for inherently not codable concepts without codable children: Secondary | ICD-10-CM | POA: Diagnosis not present

## 2013-06-20 DIAGNOSIS — M6281 Muscle weakness (generalized): Secondary | ICD-10-CM

## 2013-06-20 DIAGNOSIS — M25611 Stiffness of right shoulder, not elsewhere classified: Secondary | ICD-10-CM

## 2013-06-20 DIAGNOSIS — M25519 Pain in unspecified shoulder: Secondary | ICD-10-CM

## 2013-06-20 NOTE — Progress Notes (Signed)
Occupational Therapy Treatment Patient Details  Name: [name] [name] MRN: [id number] Date of Birth: [birth year]  Today's Date: 06/20/2013 Time: 3570-1779 OT Time Calculation (min): 43 min MFR 3903-0092 11' Therex 3300-7622 32'  Visit#: 22 of 63  Re-eval: 06/21/13    Authorization: BCBS (or workers comp?)  Authorization Time Period: n/a  Authorization Visit#: 22 of    Subjective Symptoms/Limitations Symptoms: S: My shoulder feels the same. A little sore.  Pain Assessment Currently in Pain?: Yes Pain Score: 2  Pain Location: Shoulder Pain Type: Acute pain  Precautions/Restrictions  Precautions Precautions: Shoulder Type of Shoulder Precautions: Protocol - scanned  Exercise/Treatments Supine Protraction: PROM;Strengthening;10 reps Protraction Weight (lbs): 2 Horizontal ABduction: PROM;Strengthening;10 reps Horizontal ABduction Weight (lbs): 2 External Rotation: PROM;Strengthening;10 reps External Rotation Weight (lbs): 2 Internal Rotation: PROM;Strengthening;10 reps Internal Rotation Weight (lbs): 2 Flexion: PROM;Strengthening;10 reps Shoulder Flexion Weight (lbs): 2 ABduction: PROM;10 reps;Strengthening Shoulder ABduction Weight (lbs): 2 Seated Protraction: Strengthening;15 reps Protraction Weight (lbs): 1 Horizontal ABduction: Strengthening;15 reps Horizontal ABduction Weight (lbs): 1 External Rotation: Strengthening;15 reps External Rotation Weight (lbs): 1 Internal Rotation: Strengthening;15 reps Internal Rotation Weight (lbs): 1 Flexion: Strengthening;15 reps Flexion Weight (lbs): 1 Abduction: Strengthening;15 reps ABduction Weight (lbs): 1 ROM / Strengthening / Isometric Strengthening UBE (Upper Arm Bike): 1.0 1' forward/ 1' reverse Wall Wash: 2' with 1# "W" Arms: 12 times with 1# X to V Arms: 12 times with 1# Proximal Shoulder Strengthening, Supine: 10x with 2# Proximal Shoulder Strengthening, Seated: 15x with 1# Rest breaks after each movement       Manual Therapy Manual Therapy: Myofascial release Myofascial Release: Myofascial release (MFR) and manual stretching to right upper arm, scapular, and shoulder region to decrease pain and fascial restrictions improve pain free mobility  Occupational Therapy Assessment and Plan OT Assessment and Plan Clinical Impression Statement: A: Increased reps while seated using 1#. Patient tolerated well. Pt did not complete ball on the wall due to time constraint.  OT Plan: P: Reassess. Ball on the wall.    Goals Short Term Goals Time to Complete Short Term Goals: 6 weeks Short Term Goal 1: Pt will be educated on HEP Short Term Goal 2: Pt will attain right shoulder PROM to [facility] in order to facilitate improved reaching for brushing her teeth Short Term Goal 3: Pt will have right shoulder pain of less than 5/10  Short Term Goal 4: Pt will have min fascial restrictions in right shoulder and upper arm region Long Term Goals Time to Complete Long Term Goals: 12 weeks Long Term Goal 1: Pt will achieve highest level of functioning in all ADL, IADL, work, and leisure tasks with RUE. Long Term Goal 1 Progress: Progressing toward goal Long Term Goal 2: Pt will have right shoulder strength of at least 4+/5 to facilitate improved lifting of her granchildren/great-grandchildren. Long Term Goal 2 Progress: Progressing toward goal Long Term Goal 3: Pt will have right shoulder AROM WNL in order to brush teeth with right hand. Long Term Goal 4: Pt will have pain of less than 2/10 in right shoulder with daily activities Long Term Goal 4 Progress: Progressing toward goal Long Term Goal 5: Pt will have trace fascial restricions in right upper extremity.  Problem List Patient Active Problem List   Diagnosis Date Noted  . Pain in joint, shoulder region 03/21/2013  . Decreased range of motion of right shoulder 03/21/2013  . Muscle weakness (generalized) 03/21/2013  . Metastatic renal cell carcinoma   . Pain in  thoracic spine  12/09/2011  . DDD (degenerative disc disease), lumbar 12/09/2011  . Right ankle pain 10/28/2011  . Difficulty in walking 10/28/2011  . TINEA PEDIS 04/10/2008  . DIABETES MELLITUS, TYPE II 04/10/2008  . HEADACHE 04/13/2007  . ABNORMAL ELECTROCARDIOGRAM 04/13/2007  . HYPERLIPIDEMIA 09/03/2006  . DEPRESSION 09/03/2006  . HYPERTENSION 09/03/2006  . COPD 09/03/2006  . CONSTIPATION NOS 09/03/2006  . OSTEOARTHRITIS 09/03/2006  . LOW BACK PAIN, CHRONIC 09/03/2006  . BURSITIS, HIPS, BILATERAL 09/03/2006  . FIBROMYALGIA 09/03/2006  . FATIGUE 09/03/2006  . URINARY INCONTINENCE 09/03/2006    End of Session Activity Tolerance: Patient tolerated treatment well General Behavior During Therapy: [facility] for tasks assessed/performed   Ailene Ravel, OTR/L,CBIS   06/20/2013, 11:51 AM

## 2013-06-23 ENCOUNTER — Encounter (HOSPITAL_COMMUNITY): Payer: Self-pay

## 2013-06-23 ENCOUNTER — Encounter (HOSPITAL_COMMUNITY): Payer: BC Managed Care – PPO | Attending: Hematology and Oncology

## 2013-06-23 ENCOUNTER — Encounter (HOSPITAL_BASED_OUTPATIENT_CLINIC_OR_DEPARTMENT_OTHER): Payer: BC Managed Care – PPO

## 2013-06-23 ENCOUNTER — Other Ambulatory Visit (HOSPITAL_COMMUNITY): Payer: Self-pay

## 2013-06-23 ENCOUNTER — Ambulatory Visit (HOSPITAL_COMMUNITY)
Admission: RE | Admit: 2013-06-23 | Discharge: 2013-06-23 | Disposition: A | Discharge status: Home / Self Care | Payer: Worker's Compensation | Source: Ambulatory Visit | Attending: Physician Assistant | Admitting: Physician Assistant

## 2013-06-23 VITALS — BP 145/84 | HR 67 | Temp 97.9°F | Resp 18 | Wt 190.6 lb

## 2013-06-23 DIAGNOSIS — R918 Other nonspecific abnormal finding of lung field: Secondary | ICD-10-CM | POA: Diagnosis present

## 2013-06-23 DIAGNOSIS — E119 Type 2 diabetes mellitus without complications: Secondary | ICD-10-CM

## 2013-06-23 DIAGNOSIS — IMO0001 Reserved for inherently not codable concepts without codable children: Secondary | ICD-10-CM | POA: Diagnosis not present

## 2013-06-23 DIAGNOSIS — C7800 Secondary malignant neoplasm of unspecified lung: Secondary | ICD-10-CM

## 2013-06-23 DIAGNOSIS — C801 Malignant (primary) neoplasm, unspecified: Secondary | ICD-10-CM | POA: Diagnosis present

## 2013-06-23 DIAGNOSIS — K219 Gastro-esophageal reflux disease without esophagitis: Secondary | ICD-10-CM | POA: Diagnosis present

## 2013-06-23 DIAGNOSIS — R5381 Other malaise: Secondary | ICD-10-CM | POA: Diagnosis not present

## 2013-06-23 DIAGNOSIS — C649 Malignant neoplasm of unspecified kidney, except renal pelvis: Secondary | ICD-10-CM | POA: Diagnosis present

## 2013-06-23 DIAGNOSIS — I10 Essential (primary) hypertension: Secondary | ICD-10-CM

## 2013-06-23 DIAGNOSIS — R5383 Other fatigue: Secondary | ICD-10-CM

## 2013-06-23 DIAGNOSIS — R110 Nausea: Secondary | ICD-10-CM

## 2013-06-23 LAB — CBC WITH DIFFERENTIAL/PLATELET
Basophils Absolute: 0 10*3/uL (ref 0.0–0.1)
Basophils Relative: 0 % (ref 0–1)
Eosinophils Absolute: 0.5 10*3/uL (ref 0.0–0.7)
Eosinophils Relative: 4 % (ref 0–5)
HCT: 42.7 % (ref 36.0–46.0)
HEMOGLOBIN: 14.7 g/dL (ref 12.0–15.0)
Lymphocytes Relative: 28 % (ref 12–46)
Lymphs Abs: 2.9 10*3/uL (ref 0.7–4.0)
MCH: 33.4 pg (ref 26.0–34.0)
MCHC: 34.4 g/dL (ref 30.0–36.0)
MCV: 97 fL (ref 78.0–100.0)
MONOS PCT: 5 % (ref 3–12)
Monocytes Absolute: 0.6 10*3/uL (ref 0.1–1.0)
NEUTROS PCT: 63 % (ref 43–77)
Neutro Abs: 6.6 10*3/uL (ref 1.7–7.7)
PLATELETS: 227 10*3/uL (ref 150–400)
RBC: 4.4 MIL/uL (ref 3.87–5.11)
RDW: 14.4 % (ref 11.5–15.5)
WBC: 10.6 10*3/uL — ABNORMAL HIGH (ref 4.0–10.5)

## 2013-06-23 LAB — TSH: TSH: 6 u[IU]/mL — ABNORMAL HIGH (ref 0.350–4.500)

## 2013-06-23 LAB — URINALYSIS, ROUTINE W REFLEX MICROSCOPIC
BILIRUBIN URINE: NEGATIVE
Glucose, UA: NEGATIVE mg/dL
HGB URINE DIPSTICK: NEGATIVE
Ketones, ur: NEGATIVE mg/dL
Leukocytes, UA: NEGATIVE
Nitrite: NEGATIVE
PH: 5.5 (ref 5.0–8.0)
Specific Gravity, Urine: >1.03 — ABNORMAL HIGH (ref 1.005–1.030)
Urobilinogen, UA: 0.2 mg/dL (ref 0.0–1.0)

## 2013-06-23 LAB — COMPREHENSIVE METABOLIC PANEL
ALT: 15 U/L (ref 0–35)
AST: 21 U/L (ref 0–37)
Albumin: 3.7 g/dL (ref 3.5–5.2)
Alkaline Phosphatase: 76 U/L (ref 39–117)
BILIRUBIN TOTAL: 0.5 mg/dL (ref 0.3–1.2)
BUN: 23 mg/dL (ref 6–23)
CHLORIDE: 104 meq/L (ref 96–112)
CO2: 21 mEq/L (ref 19–32)
Calcium: 10.1 mg/dL (ref 8.4–10.5)
Creatinine, Ser: 1.18 mg/dL — ABNORMAL HIGH (ref 0.50–1.10)
GFR calc Af Amer: 56 mL/min — ABNORMAL LOW (ref >90)
GFR calc non Af Amer: 48 mL/min — ABNORMAL LOW (ref >90)
Glucose, Bld: 117 mg/dL — ABNORMAL HIGH (ref 70–99)
POTASSIUM: 4.4 meq/L (ref 3.7–5.3)
SODIUM: 142 meq/L (ref 137–147)
TOTAL PROTEIN: 7.7 g/dL (ref 6.0–8.3)

## 2013-06-23 LAB — URINE MICROSCOPIC-ADD ON

## 2013-06-23 LAB — LACTATE DEHYDROGENASE: LDH: 138 U/L (ref 94–250)
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
## 2013-06-23 NOTE — Patient Instructions (Signed)
[facility] Discharge Instructions  RECOMMENDATIONS MADE BY THE CONSULTANT AND ANY TEST RESULTS WILL BE SENT TO YOUR REFERRING PHYSICIAN.  EXAM FINDINGS BY THE PHYSICIAN TODAY AND SIGNS OR SYMPTOMS TO REPORT TO CLINIC OR PRIMARY PHYSICIAN: Exam and findings as discussed by Dr. Barnet Glasgow. We will check blood work and urine sample monthly.  Report uncontrolled pain or other problems  MEDICATIONS PRESCRIBED:  none  INSTRUCTIONS/FOLLOW-UP: Follow-up monthly with lab work and office visit in 3 months.  Thank you for choosing [facility] to provide your oncology and hematology care.  To afford each patient quality time with our providers, please arrive at least 15 minutes before your scheduled appointment time.  With your help, our goal is to use those 15 minutes to complete the necessary work-up to ensure our physicians have the information they need to help with your evaluation and healthcare recommendations.    Effective January 1st, 2014, we ask that you re-schedule your appointment with our physicians should you arrive 10 or more minutes late for your appointment.  We strive to give you quality time with our providers, and arriving late affects you and other patients whose appointments are after yours.    Again, thank you for choosing [facility].  Our hope is that these requests will decrease the amount of time that you wait before being seen by our physicians.       _____________________________________________________________  Should you have questions after your visit to [facility], please contact our office at [phone] between the hours of 8:30 a.m. and 5:00 p.m.  Voicemails left after 4:30 p.m. will not be returned until the following business day.  For prescription refill requests, have your pharmacy contact our office with your prescription refill request.

## 2013-06-23 NOTE — Progress Notes (Signed)
Lab draw

## 2013-06-26 ENCOUNTER — Encounter (HOSPITAL_COMMUNITY): Payer: Self-pay | Admitting: Psychiatry

## 2013-06-26 ENCOUNTER — Ambulatory Visit (INDEPENDENT_AMBULATORY_CARE_PROVIDER_SITE_OTHER): Payer: BC Managed Care – PPO | Admitting: Psychiatry

## 2013-06-26 VITALS — BP 150/90 | Ht 65.0 in | Wt 188.0 lb

## 2013-06-26 DIAGNOSIS — F3289 Other specified depressive episodes: Secondary | ICD-10-CM

## 2013-06-26 DIAGNOSIS — N2889 Other specified disorders of kidney and ureter: Secondary | ICD-10-CM

## 2013-06-26 DIAGNOSIS — F329 Major depressive disorder, single episode, unspecified: Secondary | ICD-10-CM

## 2013-06-26 MED ORDER — METHYLPHENIDATE HCL 10 MG PO TABS: 10.0000 mg | ORAL_TABLET | Freq: Two times a day (BID) | ORAL | Status: DC

## 2013-06-26 MED ORDER — ZOLPIDEM TARTRATE 5 MG PO TABS: 5.0000 mg | ORAL_TABLET | Freq: Every evening | ORAL | Status: DC | PRN

## 2013-06-26 MED ORDER — PAROXETINE HCL 20 MG PO TABS: 20.0000 mg | ORAL_TABLET | Freq: Two times a day (BID) | ORAL | Status: DC
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
## 2013-06-27 ENCOUNTER — Ambulatory Visit (HOSPITAL_COMMUNITY): Payer: Self-pay

## 2013-06-27 ENCOUNTER — Ambulatory Visit (INDEPENDENT_AMBULATORY_CARE_PROVIDER_SITE_OTHER): Payer: BC Managed Care – PPO | Admitting: Psychiatry

## 2013-06-27 DIAGNOSIS — F3289 Other specified depressive episodes: Secondary | ICD-10-CM

## 2013-06-27 DIAGNOSIS — F329 Major depressive disorder, single episode, unspecified: Secondary | ICD-10-CM

## 2013-06-28 ENCOUNTER — Ambulatory Visit (HOSPITAL_COMMUNITY): Payer: Self-pay | Admitting: Psychiatry

## 2013-06-28 NOTE — Progress Notes (Signed)
   THERAPIST PROGRESS NOTE  Session Time: Tuesday 06/27/2013 4:05 PM - 4:55 PM  Participation Level: Active  Behavioral Response: CasualAlertAnxious and Depressed/tearful  Type of Therapy: Individual Therapy  Treatment Goals addressed:   Improve ability to manage stress and transitions without irritability and being overwhelmed      Resume normal interest in activities and increase social interaction   Interventions: CBT and Supportive  Summary: [name] [name] is a 62 y.o. female who presents with symptoms of depression and anxiety that have been intermittent for several years that have been well controlled until patient was diagnosed with cancer in 2013. Symptoms have worsened in recent months as patient reports constantly thinking about her diagnoses and fears cancer will spread. She has been given a prognosis of 2-4 years to live. Patient reports taking chemotherapy pills and reports extreme fatigue. Patient also is experiencing depression, anxiety, and crying spells.   Patient was last seen this clinician in February 2013. She reports increased anxiety and stress for the past several months. Patient is experiencing extreme fatigue, nausea daily, daily crying spells, irritability, and ruminating thoughts. Her physician is pleased with patient's current status as tests indicate cancer is not spreading. However, patient constantly thinks about dying and worries about the welfare of her family. She expresses frustration and sadness that she does not have the energy to play with her grandchildren. She also experiences poor concentration and memory difficulty which causes difficulty managing financial affairs. Her husband assists patient with business matters but patient expresses guilt regarding this.    Suicidal/Homicidal: No  Therapist Response: Therapist works with patient to process feelings, explore relaxation techniques, identify coping statements, discuss patient's spirituality  Plan:  Return again in 2 weeks.  Diagnosis: Axis I: Depressive Disorder NOS    Axis II: No diagnosis    BYNUM,PEGGY, LCSW 06/28/2013

## 2013-06-28 NOTE — Patient Instructions (Signed)
Discussed orally 

## 2013-06-30 ENCOUNTER — Ambulatory Visit (HOSPITAL_COMMUNITY): Payer: Self-pay | Admitting: Specialist

## 2013-07-07 ENCOUNTER — Other Ambulatory Visit (HOSPITAL_COMMUNITY): Payer: Self-pay | Admitting: Oncology

## 2013-07-07 ENCOUNTER — Other Ambulatory Visit (HOSPITAL_COMMUNITY): Payer: Self-pay | Admitting: Hematology and Oncology

## 2013-07-07 ENCOUNTER — Telehealth (HOSPITAL_COMMUNITY): Payer: Self-pay

## 2013-07-07 DIAGNOSIS — C649 Malignant neoplasm of unspecified kidney, except renal pelvis: Secondary | ICD-10-CM

## 2013-07-07 MED ORDER — PAZOPANIB HCL 200 MG PO TABS: 800.0000 mg | ORAL_TABLET | Freq: Every day | ORAL | Status: DC

## 2013-07-07 NOTE — Telephone Encounter (Signed)
Call from [facility] requesting refill for Votrient be either called to [phone] or faxed to [phone].

## 2013-07-13 ENCOUNTER — Ambulatory Visit (INDEPENDENT_AMBULATORY_CARE_PROVIDER_SITE_OTHER): Payer: BC Managed Care – PPO | Admitting: Psychiatry

## 2013-07-13 DIAGNOSIS — F329 Major depressive disorder, single episode, unspecified: Secondary | ICD-10-CM

## 2013-07-13 DIAGNOSIS — F3289 Other specified depressive episodes: Secondary | ICD-10-CM

## 2013-07-13 NOTE — Progress Notes (Signed)
   THERAPIST PROGRESS NOTE  Session Time: Thursday 07/13/2013 10:55 AM - 11:45 AM  Participation Level: Active  Behavioral Response: CasualAlert/ less depressed/less anxious  Type of Therapy: Individual Therapy  Treatment Goals addressed:  Improve ability to manage stress and transitions without irritability and being overwhelmed       Resume normal interest in activities and increase social interaction    Interventions: CBT and Supportive  Summary: [name] [name] is a 63 y.o. female who presents with symptoms of depression and anxiety that have been intermittent for several years that have been well controlled until patient was diagnosed with cancer in 2013. Symptoms have worsened in recent months as patient reports constantly thinking about her diagnoses and fears cancer will spread. She has been given a prognosis of 2-4 years to live. Patient reports taking chemotherapy pills and reports extreme fatigue. Patient also is experiencing depression, anxiety, and crying spells.   Patient reports improved mood and decreased worry since last session. She has been using spirituality and coping statements. She also has been able to compartmentalize tasks related to financial issues. Patient reports home remains crowded but being able to enjoy breaks house sitting for her sister-in-law. Patient continues to experience fatigue and expresses frustration regarding reduced physical functioning. She reports she had suspicions doctors may have shared information with her husband about her prognosis they didn't share with her. However, she confronted him last night and he assured her he didn't have any more information. Patient reports feeling better since conversation.   Suicidal/Homicidal: No  Therapist Response: Therapist works with patient to process feelings, reinforce patient's use of coping skills, identify realistic expectations of self, identify activities within patient's capability and ways to  improve patient's daily routine and structure, identify coping statements  Plan: Return again in 2-3 weeks.  Diagnosis: Axis I: Depressive Disorder NOS    Axis II: No diagnosis    BYNUM,PEGGY, LCSW 07/13/2013

## 2013-07-13 NOTE — Patient Instructions (Signed)
Discussed orally 

## 2013-07-21 ENCOUNTER — Encounter (HOSPITAL_COMMUNITY): Payer: BC Managed Care – PPO | Attending: Hematology and Oncology

## 2013-07-21 DIAGNOSIS — C801 Malignant (primary) neoplasm, unspecified: Secondary | ICD-10-CM | POA: Insufficient documentation

## 2013-07-21 DIAGNOSIS — E039 Hypothyroidism, unspecified: Secondary | ICD-10-CM | POA: Diagnosis present

## 2013-07-21 DIAGNOSIS — C649 Malignant neoplasm of unspecified kidney, except renal pelvis: Secondary | ICD-10-CM | POA: Diagnosis not present

## 2013-07-21 DIAGNOSIS — R918 Other nonspecific abnormal finding of lung field: Secondary | ICD-10-CM | POA: Diagnosis present

## 2013-07-21 DIAGNOSIS — R5383 Other fatigue: Secondary | ICD-10-CM

## 2013-07-21 DIAGNOSIS — K219 Gastro-esophageal reflux disease without esophagitis: Secondary | ICD-10-CM | POA: Diagnosis present

## 2013-07-21 DIAGNOSIS — R5381 Other malaise: Secondary | ICD-10-CM | POA: Diagnosis present

## 2013-07-21 DIAGNOSIS — C7800 Secondary malignant neoplasm of unspecified lung: Secondary | ICD-10-CM

## 2013-07-21 DIAGNOSIS — R110 Nausea: Secondary | ICD-10-CM | POA: Diagnosis present

## 2013-07-21 LAB — URINE MICROSCOPIC-ADD ON

## 2013-07-21 LAB — CBC WITH DIFFERENTIAL/PLATELET
BASOS ABS: 0 10*3/uL (ref 0.0–0.1)
Basophils Relative: 0 % (ref 0–1)
Eosinophils Absolute: 0.3 10*3/uL (ref 0.0–0.7)
Eosinophils Relative: 3 % (ref 0–5)
HCT: 43.4 % (ref 36.0–46.0)
Hemoglobin: 15 g/dL (ref 12.0–15.0)
LYMPHS PCT: 30 % (ref 12–46)
Lymphs Abs: 3 10*3/uL (ref 0.7–4.0)
MCH: 34.1 pg — ABNORMAL HIGH (ref 26.0–34.0)
MCHC: 34.6 g/dL (ref 30.0–36.0)
MCV: 98.6 fL (ref 78.0–100.0)
Monocytes Absolute: 0.4 10*3/uL (ref 0.1–1.0)
Monocytes Relative: 4 % (ref 3–12)
Neutro Abs: 6.3 10*3/uL (ref 1.7–7.7)
Neutrophils Relative %: 63 % (ref 43–77)
PLATELETS: 244 10*3/uL (ref 150–400)
RBC: 4.4 MIL/uL (ref 3.87–5.11)
RDW: 14.7 % (ref 11.5–15.5)
WBC: 10 10*3/uL (ref 4.0–10.5)

## 2013-07-21 LAB — URINALYSIS, ROUTINE W REFLEX MICROSCOPIC
Bilirubin Urine: NEGATIVE
GLUCOSE, UA: NEGATIVE mg/dL
Hgb urine dipstick: NEGATIVE
Ketones, ur: NEGATIVE mg/dL
Nitrite: NEGATIVE
PH: 5.5 (ref 5.0–8.0)
Urobilinogen, UA: 0.2 mg/dL (ref 0.0–1.0)

## 2013-07-21 LAB — COMPREHENSIVE METABOLIC PANEL
ALT: 19 U/L (ref 0–35)
AST: 21 U/L (ref 0–37)
Albumin: 3.4 g/dL — ABNORMAL LOW (ref 3.5–5.2)
Alkaline Phosphatase: 71 U/L (ref 39–117)
Anion gap: 16 — ABNORMAL HIGH (ref 5–15)
BUN: 25 mg/dL — ABNORMAL HIGH (ref 6–23)
CALCIUM: 9.8 mg/dL (ref 8.4–10.5)
CO2: 19 meq/L (ref 19–32)
Chloride: 106 mEq/L (ref 96–112)
Creatinine, Ser: 1.28 mg/dL — ABNORMAL HIGH (ref 0.50–1.10)
GFR calc Af Amer: 50 mL/min — ABNORMAL LOW (ref >90)
GFR calc non Af Amer: 44 mL/min — ABNORMAL LOW (ref >90)
Glucose, Bld: 117 mg/dL — ABNORMAL HIGH (ref 70–99)
Potassium: 5 mEq/L (ref 3.7–5.3)
SODIUM: 141 meq/L (ref 137–147)
Total Bilirubin: 0.5 mg/dL (ref 0.3–1.2)
Total Protein: 7.2 g/dL (ref 6.0–8.3)

## 2013-07-21 LAB — LACTATE DEHYDROGENASE: LDH: 132 U/L (ref 94–250)

## 2013-07-21 NOTE — Progress Notes (Signed)
LABS DRAWN FOR CBCD,CMP,LDH,TSH,UMAC

## 2013-07-22 LAB — TSH: TSH: 3.6 u[IU]/mL (ref 0.350–4.500)

## 2013-07-26 ENCOUNTER — Telehealth (HOSPITAL_COMMUNITY): Payer: Self-pay

## 2013-08-03 ENCOUNTER — Ambulatory Visit (HOSPITAL_COMMUNITY): Payer: Self-pay | Admitting: Psychiatry

## 2013-08-03 ENCOUNTER — Ambulatory Visit (INDEPENDENT_AMBULATORY_CARE_PROVIDER_SITE_OTHER): Payer: BC Managed Care – PPO | Admitting: Psychiatry

## 2013-08-03 DIAGNOSIS — F329 Major depressive disorder, single episode, unspecified: Secondary | ICD-10-CM

## 2013-08-03 DIAGNOSIS — F3289 Other specified depressive episodes: Secondary | ICD-10-CM

## 2013-08-03 NOTE — Progress Notes (Signed)
   THERAPIST PROGRESS NOTE  Session Time: Thursday 08/03/2013 9:00 AM - 9:50 AM  Participation Level: Active  Behavioral Response: CasualAlertAnxious and Irritable  Type of Therapy: Individual Therapy  Treatment Goals addressed:  Improve ability to manage stress and transitions without irritability and being overwhelmed          Resume normal interest in activities and increase social interaction   Interventions: CBT and Supportive  Summary: [name] [name] is a 63 y.o. female who presents with symptoms of depression and anxiety that have been intermittent for several years that have been well controlled until patient was diagnosed with cancer in 2013. Symptoms have worsened in recent months as patient reports constantly thinking about her diagnoses and fears cancer will spread. She has been given a prognosis of 2-4 years to live. Patient reports taking chemotherapy pills and reports extreme fatigue. Patient also experiences depression, anxiety, and crying spells.   Patient reports improved mood and doing well until last week when she began to experience shoulder pain. She began using a six day  prednisone pack as prescribed by her doctor. Patient reports enjoying having the initial increase in energy but experiencing sleep difficulty. She eventually began to experience fatigue which triggered automatic thoughts about having cancer and this being the end. Patient became anxious, irritable, and depressed. Patient shares fearing  death as she is uncertain about her salvation,.  Suicidal/Homicidal: No  Therapist Response: Therapist works with patient to process feelings, discuss possible effects of medication, identify and challenge thinking errors, discuss thoughts and feelings about death, discuss spirituality, encourage patient to talk with her pastor about spirituality and salvation.  Plan: Return again in 2-3 weeks.  Diagnosis: Axis I: Depressive Disorder NOS    Axis II:  Deferred    [name], LCSW 08/03/2013

## 2013-08-03 NOTE — Patient Instructions (Signed)
Discussed orally 

## 2013-08-07 ENCOUNTER — Other Ambulatory Visit: Payer: BC Managed Care – PPO

## 2013-08-08 ENCOUNTER — Other Ambulatory Visit: Payer: Self-pay | Admitting: Orthopedic Surgery

## 2013-08-08 ENCOUNTER — Encounter (HOSPITAL_COMMUNITY): Payer: Self-pay | Admitting: Oncology

## 2013-08-08 ENCOUNTER — Other Ambulatory Visit: Payer: BC Managed Care – PPO

## 2013-08-08 ENCOUNTER — Encounter (HOSPITAL_BASED_OUTPATIENT_CLINIC_OR_DEPARTMENT_OTHER): Payer: BC Managed Care – PPO | Admitting: Oncology

## 2013-08-08 VITALS — BP 130/84 | HR 92 | Temp 97.9°F | Resp 20 | Wt 184.6 lb

## 2013-08-08 DIAGNOSIS — C649 Malignant neoplasm of unspecified kidney, except renal pelvis: Secondary | ICD-10-CM | POA: Diagnosis not present

## 2013-08-08 DIAGNOSIS — R918 Other nonspecific abnormal finding of lung field: Secondary | ICD-10-CM

## 2013-08-08 DIAGNOSIS — R197 Diarrhea, unspecified: Secondary | ICD-10-CM

## 2013-08-08 DIAGNOSIS — I10 Essential (primary) hypertension: Secondary | ICD-10-CM

## 2013-08-08 DIAGNOSIS — K219 Gastro-esophageal reflux disease without esophagitis: Secondary | ICD-10-CM

## 2013-08-08 DIAGNOSIS — Z79899 Other long term (current) drug therapy: Secondary | ICD-10-CM

## 2013-08-08 DIAGNOSIS — R110 Nausea: Secondary | ICD-10-CM

## 2013-08-08 DIAGNOSIS — R5381 Other malaise: Secondary | ICD-10-CM

## 2013-08-08 DIAGNOSIS — E785 Hyperlipidemia, unspecified: Secondary | ICD-10-CM

## 2013-08-08 DIAGNOSIS — E039 Hypothyroidism, unspecified: Secondary | ICD-10-CM

## 2013-08-08 DIAGNOSIS — R5383 Other fatigue: Secondary | ICD-10-CM

## 2013-08-08 DIAGNOSIS — M542 Cervicalgia: Secondary | ICD-10-CM

## 2013-08-08 DIAGNOSIS — E119 Type 2 diabetes mellitus without complications: Secondary | ICD-10-CM

## 2013-08-08 LAB — COMPREHENSIVE METABOLIC PANEL
ALK PHOS: 73 U/L (ref 39–117)
ALT: 22 U/L (ref 0–35)
ANION GAP: 18 — AB (ref 5–15)
AST: 26 U/L (ref 0–37)
Albumin: 3.6 g/dL (ref 3.5–5.2)
BILIRUBIN TOTAL: 0.4 mg/dL (ref 0.3–1.2)
BUN: 19 mg/dL (ref 6–23)
CHLORIDE: 105 meq/L (ref 96–112)
CO2: 19 meq/L (ref 19–32)
Calcium: 9.9 mg/dL (ref 8.4–10.5)
Creatinine, Ser: 1.5 mg/dL — ABNORMAL HIGH (ref 0.50–1.10)
GFR calc Af Amer: 41 mL/min — ABNORMAL LOW (ref >90)
GFR calc non Af Amer: 36 mL/min — ABNORMAL LOW (ref >90)
Glucose, Bld: 80 mg/dL (ref 70–99)
Potassium: 4.5 mEq/L (ref 3.7–5.3)
Sodium: 142 mEq/L (ref 137–147)
Total Protein: 7.6 g/dL (ref 6.0–8.3)

## 2013-08-08 LAB — URINE MICROSCOPIC-ADD ON

## 2013-08-08 LAB — LIPID PANEL
CHOL/HDL RATIO: 3.1 ratio
Cholesterol: 160 mg/dL (ref 0–200)
HDL: 51 mg/dL (ref >39)
LDL CALC: 72 mg/dL (ref 0–99)
Triglycerides: 185 mg/dL — ABNORMAL HIGH (ref <150)
VLDL: 37 mg/dL (ref 0–40)

## 2013-08-08 LAB — URINALYSIS, ROUTINE W REFLEX MICROSCOPIC
BILIRUBIN URINE: NEGATIVE
Glucose, UA: NEGATIVE mg/dL
HGB URINE DIPSTICK: NEGATIVE
Nitrite: NEGATIVE
Specific Gravity, Urine: 1.025 (ref 1.005–1.030)
UROBILINOGEN UA: 0.2 mg/dL (ref 0.0–1.0)
pH: 5.5 (ref 5.0–8.0)

## 2013-08-08 LAB — HEMOGLOBIN A1C
Hgb A1c MFr Bld: 6 % — ABNORMAL HIGH (ref <5.7)
Mean Plasma Glucose: 126 mg/dL — ABNORMAL HIGH (ref <117)

## 2013-08-08 LAB — CBC WITH DIFFERENTIAL/PLATELET
BASOS ABS: 0 10*3/uL (ref 0.0–0.1)
Basophils Relative: 0 % (ref 0–1)
Eosinophils Absolute: 0.2 10*3/uL (ref 0.0–0.7)
Eosinophils Relative: 2 % (ref 0–5)
HCT: 45 % (ref 36.0–46.0)
Hemoglobin: 15.7 g/dL — ABNORMAL HIGH (ref 12.0–15.0)
LYMPHS ABS: 3.7 10*3/uL (ref 0.7–4.0)
Lymphocytes Relative: 29 % (ref 12–46)
MCH: 35.1 pg — ABNORMAL HIGH (ref 26.0–34.0)
MCHC: 34.9 g/dL (ref 30.0–36.0)
MCV: 100.7 fL — ABNORMAL HIGH (ref 78.0–100.0)
Monocytes Absolute: 0.5 10*3/uL (ref 0.1–1.0)
Monocytes Relative: 4 % (ref 3–12)
NEUTROS PCT: 65 % (ref 43–77)
Neutro Abs: 8.7 10*3/uL — ABNORMAL HIGH (ref 1.7–7.7)
PLATELETS: 271 10*3/uL (ref 150–400)
RBC: 4.47 MIL/uL (ref 3.87–5.11)
RDW: 14.9 % (ref 11.5–15.5)
WBC: 13.1 10*3/uL — AB (ref 4.0–10.5)

## 2013-08-08 LAB — LACTATE DEHYDROGENASE: LDH: 263 U/L — AB (ref 94–250)

## 2013-08-08 MED ORDER — ONDANSETRON HCL 8 MG PO TABS: 8.0000 mg | ORAL_TABLET | Freq: Two times a day (BID) | ORAL | Status: DC | PRN

## 2013-08-08 MED ORDER — PROCHLORPERAZINE MALEATE 10 MG PO TABS: 10.0000 mg | ORAL_TABLET | Freq: Four times a day (QID) | ORAL | Status: DC | PRN

## 2013-08-08 MED ORDER — PAZOPANIB HCL 200 MG PO TABS: 800.0000 mg | ORAL_TABLET | Freq: Every day | ORAL | Status: DC

## 2013-08-08 NOTE — Patient Instructions (Addendum)
[facility] Discharge Instructions  RECOMMENDATIONS MADE BY THE CONSULTANT AND ANY TEST RESULTS WILL BE SENT TO YOUR REFERRING PHYSICIAN.  EXAM FINDINGS BY THE PHYSICIAN TODAY AND SIGNS OR SYMPTOMS TO REPORT TO CLINIC OR PRIMARY PHYSICIAN: you seen [name] [name] today  Follow up every 4 weeks with labs.  Hold Votrient for a fews days.  Call Thursday or Friday to give [place] an update.  Prescription given for compazine.  Hold zofran and reglan.  If compazine does not help with nausea alone add zofran back   Thank you for choosing [facility] to provide your oncology and hematology care.  To afford each patient quality time with our providers, please arrive at least 15 minutes before your scheduled appointment time.  With your help, our goal is to use those 15 minutes to complete the necessary work-up to ensure our physicians have the information they need to help with your evaluation and healthcare recommendations.    Effective January 1st, 2014, we ask that you re-schedule your appointment with our physicians should you arrive 10 or more minutes late for your appointment.  We strive to give you quality time with our providers, and arriving late affects you and other patients whose appointments are after yours.    Again, thank you for choosing [facility].  Our hope is that these requests will decrease the amount of time that you wait before being seen by our physicians.       _____________________________________________________________  Should you have questions after your visit to [facility], please contact our office at [phone] between the hours of 8:30 a.m. and 5:00 p.m.  Voicemails left after 4:30 p.m. will not be returned until the following business day.  For prescription refill requests, have your pharmacy contact our office with your prescription refill request.

## 2013-08-08 NOTE — Addendum Note (Signed)
Addended by: Baird Cancer on: 08/08/2013 03:31 PM   Modules accepted: Orders

## 2013-08-08 NOTE — Progress Notes (Addendum)
[name] [name] is seen as a work-in today.  She reports that 2 weeks ago, she was given a Pred-Pak for suspected inflammatory response in the right shoulder by her orthopod.  He has ordered an MRI for the future to evaluate for a "pinched nerve."  She has completed her course of Prednisone and since then has had a few complaints:  1. Nausea- this has been progressively worse 2. Diarrhea- improving some.  She reports that it is watery.  She admits to one loose stool today as of 2:30 PM 3. Fatigue, secondary to #1 and 2.  She denies any new medications, other than the aforementioned.  It is known that Votrient can do this, but it would be odd for this to begin now that she has been on it for quite some time.  With this being said, we need to make sure that it is not the medication causing this.  She denies any sick contact that she knows of.  She does have 9 people living in her double wide including children.  Additionally, they experiencing a bug infestation and are saving up for a full house fumigation. Her husband reports that he is not feeling the best either.  This could all be related to the way [name] [name] feels.   Plan: 1. Hold Votrient x 48-72 hours 2. Hold Reglan 3. Hold Zofran 4. Rx for Compazine 10 mg every 6 hours.  The anticholinergic effect of this medication may held with loose stools. 5. May add Zofran if Compazine is ineffective 6. Labs today: CBC diff, CMET, TSH, LDH, UA 7. If she does not feel better on Thursday she will call us.  If she is feeling better, then one could debate that she was improving naturally as she only had 1 loose stool today and argue that re-instituting Votrient at same dose is reasonable.   8. The patient's husband reports that [facility] wanted her seen monthly in our clinic so I have added additional appointments to her appointment list to be seen every 4 weeks. 9. From an oncology standpoint, she is free to pursue orthopod treatment of her musculoskeletal  complaints.  Patient and plan discussed with [name] [name] and he is in agreement with the aforementioned.   More than 50% of the time spent with the patient was utilized for counseling and coordination of care.  [name] [name] 08/08/2013  Addendum: Labs came back and are as follows:  CBC    Component Value Date/Time   WBC 13.1* 08/08/2013 1404   RBC 4.47 08/08/2013 1404   HGB 15.7* 08/08/2013 1404   HCT 45.0 08/08/2013 1404   PLT 271 08/08/2013 1404   MCV 100.7* 08/08/2013 1404   MCH 35.1* 08/08/2013 1404   MCHC 34.9 08/08/2013 1404   RDW 14.9 08/08/2013 1404   LYMPHSABS 3.7 08/08/2013 1404   MONOABS 0.5 08/08/2013 1404   EOSABS 0.2 08/08/2013 1404   BASOSABS 0.0 08/08/2013 1404      Chemistry      Component Value Date/Time   NA 142 08/08/2013 1404   K 4.5 08/08/2013 1404   CL 105 08/08/2013 1404   CO2 19 08/08/2013 1404   BUN 19 08/08/2013 1404   CREATININE 1.50* 08/08/2013 1404   CREATININE 1.18* 05/23/2012 1211      Component Value Date/Time   CALCIUM 9.9 08/08/2013 1404   ALKPHOS 73 08/08/2013 1404   AST 26 08/08/2013 1404   ALT 22 08/08/2013 1404   BILITOT 0.4 08/08/2013 1404     Results for  [name], [name] (MRN [id number]) as of 08/09/2013 09:26  Ref. Range 08/08/2013 14:04  LDH Latest Range: 94-250 U/L 263 (H)   Results for [name], [name] (MRN [id number]) as of 08/09/2013 09:26  Ref. Range 08/08/2013 14:34  TSH Latest Range: 0.350-4.500 uIU/mL 7.720 (H)    With this information, I will start her on Levothyroxine 50 mcg daily and monitor TSH.  This is unlikely to be the source of her complaints mentioned above.  Patient and plan discussed with [name] [name] and he is in agreement with the aforementioned.   [name] [name] 08/09/2013

## 2013-08-08 NOTE — Addendum Note (Signed)
Addended by: Baird Cancer on: 08/08/2013 04:36 PM   Modules accepted: Orders

## 2013-08-09 LAB — TSH: TSH: 7.72 u[IU]/mL — ABNORMAL HIGH (ref 0.350–4.500)

## 2013-08-09 MED ORDER — LEVOTHYROXINE SODIUM 50 MCG PO TABS: 50.0000 ug | ORAL_TABLET | Freq: Every day | ORAL | Status: DC

## 2013-08-09 NOTE — Addendum Note (Signed)
Addended by: Baird Cancer on: 08/09/2013 09:32 AM   Modules accepted: Orders

## 2013-08-10 ENCOUNTER — Ambulatory Visit (INDEPENDENT_AMBULATORY_CARE_PROVIDER_SITE_OTHER): Payer: BC Managed Care – PPO | Admitting: Physician Assistant

## 2013-08-10 ENCOUNTER — Encounter: Payer: Self-pay | Admitting: Physician Assistant

## 2013-08-10 VITALS — BP 132/78 | HR 68 | Temp 97.7°F | Resp 14 | Ht 65.0 in | Wt 183.0 lb

## 2013-08-10 DIAGNOSIS — E785 Hyperlipidemia, unspecified: Secondary | ICD-10-CM

## 2013-08-10 DIAGNOSIS — I10 Essential (primary) hypertension: Secondary | ICD-10-CM

## 2013-08-10 DIAGNOSIS — E119 Type 2 diabetes mellitus without complications: Secondary | ICD-10-CM
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
## 2013-08-11 ENCOUNTER — Telehealth (HOSPITAL_COMMUNITY): Payer: Self-pay | Admitting: Oncology

## 2013-08-11 NOTE — Telephone Encounter (Signed)
Returning a telephone call to [place].  She reports that overall she is feeling improved.  She notes that on Wednesday she took 3 compazine tablets throughout the day and did not have any nausea.  Yesterday she took 1 and noted that she did not have nausea.  Today she has not taken any, but on the phone she notes that she may benefit from one because she is noting a minimal sensation of nausea.  She still has fatigue and I reviewed her labs with her.  Her TSH is noted to be minimally elevated.  I do not think this is contributing much to her fatigue, but it is worth noting and correcting.  I was escribed 50 mcg of Synthroid to her pharmacy and asked her to start that medication.  I have presented the case to Dr. Barnet Glasgow for some guidance regarding her Votrient.  She has been off the medication since I saw her on Wednesday and she reports that she feels better.  Dr. Barnet [place] recommends that we re-challenge at full dose Votrient and she can call us on Monday with an update.  My note from Wednesday notes that her diarrhea was improving when I saw her and therefore, she may have been improving naturally and Votrient may not have been the culprit at all.   Now if she feels worse on Monday, we may need to address her Votrient dose and decrease it.  Patient and plan discussed with Dr. Farrel Gobble and he is in agreement with the aforementioned.   KEFALAS,THOMAS 08/11/2013

## 2013-08-14 ENCOUNTER — Telehealth: Payer: Self-pay | Admitting: Physician Assistant

## 2013-08-14 DIAGNOSIS — R110 Nausea: Secondary | ICD-10-CM

## 2013-08-14 NOTE — Telephone Encounter (Signed)
Patient is calling because she has lost her prochlorperazine (COMPAZINE) 10 MG tablet Would like to know if we could call it in for her again [phone] (M)

## 2013-08-16 MED ORDER — PROCHLORPERAZINE MALEATE 10 MG PO TABS: 10.0000 mg | ORAL_TABLET | Freq: Four times a day (QID) | ORAL | Status: DC | PRN

## 2013-08-16 NOTE — Telephone Encounter (Signed)
Yes. Can Refill for # 60 + 0.  (She has cancer)

## 2013-08-16 NOTE — Telephone Encounter (Signed)
Med refilled and pt aware was approved

## 2013-08-17 ENCOUNTER — Ambulatory Visit
Admission: RE | Admit: 2013-08-17 | Discharge: 2013-08-17 | Disposition: A | Discharge status: Home / Self Care | Payer: PRIVATE HEALTH INSURANCE | Source: Ambulatory Visit | Attending: Orthopedic Surgery | Admitting: Orthopedic Surgery

## 2013-08-17 DIAGNOSIS — M542 Cervicalgia: Secondary | ICD-10-CM

## 2013-08-18 ENCOUNTER — Encounter (HOSPITAL_COMMUNITY): Payer: BC Managed Care – PPO

## 2013-08-21 ENCOUNTER — Ambulatory Visit (HOSPITAL_COMMUNITY): Payer: Self-pay | Admitting: Psychiatry
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
## 2013-09-05 ENCOUNTER — Encounter (HOSPITAL_BASED_OUTPATIENT_CLINIC_OR_DEPARTMENT_OTHER): Payer: BC Managed Care – PPO

## 2013-09-05 ENCOUNTER — Encounter (HOSPITAL_COMMUNITY): Payer: Self-pay | Admitting: Oncology

## 2013-09-05 ENCOUNTER — Encounter (HOSPITAL_COMMUNITY): Payer: BC Managed Care – PPO | Attending: Hematology and Oncology | Admitting: Oncology

## 2013-09-05 VITALS — BP 109/72 | HR 101 | Temp 97.5°F | Resp 16 | Wt 183.2 lb

## 2013-09-05 DIAGNOSIS — R5381 Other malaise: Secondary | ICD-10-CM | POA: Insufficient documentation

## 2013-09-05 DIAGNOSIS — C801 Malignant (primary) neoplasm, unspecified: Secondary | ICD-10-CM | POA: Insufficient documentation

## 2013-09-05 DIAGNOSIS — R5383 Other fatigue: Secondary | ICD-10-CM

## 2013-09-05 DIAGNOSIS — K219 Gastro-esophageal reflux disease without esophagitis: Secondary | ICD-10-CM

## 2013-09-05 DIAGNOSIS — C649 Malignant neoplasm of unspecified kidney, except renal pelvis: Secondary | ICD-10-CM

## 2013-09-05 DIAGNOSIS — R918 Other nonspecific abnormal finding of lung field: Secondary | ICD-10-CM | POA: Insufficient documentation

## 2013-09-05 LAB — CBC WITH DIFFERENTIAL/PLATELET
BASOS ABS: 0 10*3/uL (ref 0.0–0.1)
BASOS PCT: 0 % (ref 0–1)
EOS ABS: 0.3 10*3/uL (ref 0.0–0.7)
EOS PCT: 3 % (ref 0–5)
HEMATOCRIT: 43.4 % (ref 36.0–46.0)
HEMOGLOBIN: 15 g/dL (ref 12.0–15.0)
Lymphocytes Relative: 23 % (ref 12–46)
Lymphs Abs: 2.3 10*3/uL (ref 0.7–4.0)
MCH: 34.7 pg — AB (ref 26.0–34.0)
MCHC: 34.6 g/dL (ref 30.0–36.0)
MCV: 100.5 fL — AB (ref 78.0–100.0)
MONO ABS: 0.4 10*3/uL (ref 0.1–1.0)
MONOS PCT: 4 % (ref 3–12)
Neutro Abs: 7 10*3/uL (ref 1.7–7.7)
Neutrophils Relative %: 70 % (ref 43–77)
Platelets: 264 10*3/uL (ref 150–400)
RBC: 4.32 MIL/uL (ref 3.87–5.11)
RDW: 14.6 % (ref 11.5–15.5)
WBC: 10 10*3/uL (ref 4.0–10.5)

## 2013-09-05 LAB — COMPREHENSIVE METABOLIC PANEL
ALT: 17 U/L (ref 0–35)
AST: 19 U/L (ref 0–37)
Albumin: 3.5 g/dL (ref 3.5–5.2)
Alkaline Phosphatase: 68 U/L (ref 39–117)
Anion gap: 16 — ABNORMAL HIGH (ref 5–15)
BUN: 25 mg/dL — ABNORMAL HIGH (ref 6–23)
CO2: 21 mEq/L (ref 19–32)
Calcium: 9.6 mg/dL (ref 8.4–10.5)
Chloride: 104 mEq/L (ref 96–112)
Creatinine, Ser: 1.39 mg/dL — ABNORMAL HIGH (ref 0.50–1.10)
GFR calc Af Amer: 45 mL/min — ABNORMAL LOW (ref >90)
GFR calc non Af Amer: 39 mL/min — ABNORMAL LOW (ref >90)
Glucose, Bld: 132 mg/dL — ABNORMAL HIGH (ref 70–99)
Potassium: 4.5 mEq/L (ref 3.7–5.3)
Sodium: 141 mEq/L (ref 137–147)
Total Bilirubin: 0.4 mg/dL (ref 0.3–1.2)
Total Protein: 7.4 g/dL (ref 6.0–8.3)

## 2013-09-05 LAB — URINE MICROSCOPIC-ADD ON

## 2013-09-05 LAB — URINALYSIS, ROUTINE W REFLEX MICROSCOPIC
BILIRUBIN URINE: NEGATIVE
Glucose, UA: NEGATIVE mg/dL
Hgb urine dipstick: NEGATIVE
KETONES UR: NEGATIVE mg/dL
Nitrite: NEGATIVE
PH: 5 (ref 5.0–8.0)
Protein, ur: NEGATIVE mg/dL
SPECIFIC GRAVITY, URINE: 1.025 (ref 1.005–1.030)
UROBILINOGEN UA: 0.2 mg/dL (ref 0.0–1.0)

## 2013-09-05 LAB — LACTATE DEHYDROGENASE: LDH: 138 U/L (ref 94–250)

## 2013-09-05 NOTE — Patient Instructions (Addendum)
[facility] Discharge Instructions  RECOMMENDATIONS MADE BY THE CONSULTANT AND ANY TEST RESULTS WILL BE SENT TO YOUR REFERRING PHYSICIAN.  EXAM FINDINGS BY THE PHYSICIAN TODAY AND SIGNS OR SYMPTOMS TO REPORT TO CLINIC OR PRIMARY PHYSICIAN: You saw [name] [name] Today  Please follow up in 4 weeks with the doctor and lab work.  You can take beano or gas-x to help with gas pains.  Please let us know the results of CT scan from [facility].  Thank you for choosing [facility] to provide your oncology and hematology care.  To afford each patient quality time with our providers, please arrive at least 15 minutes before your scheduled appointment time.  With your help, our goal is to use those 15 minutes to complete the necessary work-up to ensure our physicians have the information they need to help with your evaluation and healthcare recommendations.    Effective January 1st, 2014, we ask that you re-schedule your appointment with our physicians should you arrive 10 or more minutes late for your appointment.  We strive to give you quality time with our providers, and arriving late affects you and other patients whose appointments are after yours.    Again, thank you for choosing [facility].  Our hope is that these requests will decrease the amount of time that you wait before being seen by our physicians.       _____________________________________________________________  Should you have questions after your visit to [facility], please contact our office at [phone] between the hours of 8:30 a.m. and 5:00 p.m.  Voicemails left after 4:30 p.m. will not be returned until the following business day.  For prescription refill requests, have your pharmacy contact our office with your prescription refill request.

## 2013-09-05 NOTE — Progress Notes (Signed)
LABS FOR TSH,CBCD,CMP,UA,LDH

## 2013-09-05 NOTE — Progress Notes (Signed)
STAR Program Physical Impairment and Functional Assessment Screening Tool  1. Are you having any pain, including headaches, joint pain, or muscle pain (upper body = OT; lower body = PT)?  Yes, this started after my diagnosis and is still a problem.  2. Do your hands and/or feet feel numb or tingle (PT)?  No  3. Does any part of your body feel swollen or larger than usual (upper body = OT; lower body = PT)?  Yes, this started after my diagnosis and is still a problem.  4. Are you so tired that you cannot do the things you want or need to do (PT or OT)?  Yes, this started after my diagnosis and is still a problem.  5. Are you feeling weak or are you having trouble moving any part of your body (PT/OT)?  Yes, this started after my diagnosis and is still a problem.  6. Are you having trouble concentrating, thinking, or remembering things (OT/ST)?  Yes, this started after my diagnosis and is still a problem.  7. Are you having trouble moving around or feel like you might trip or fall (PT)?  Yes, this started after my diagnosis and is still a problem.  8. Are you having trouble swallowing (ST)?  No  9. Are you having trouble speaking (ST)?  No  10. Are you having trouble with going or getting to the bathroom (OT)?  No  11. Are you having trouble with your sexual function (OT)?  No  12. Are you having trouble lifting things, even just your arms (OT/PT)?  Yes, this started after my diagnosis and is still a problem.  48. Are you having trouble taking care of yourself as in dressing or bathing (OT)?  Yes, this started after my diagnosis and is still a problem.  14. Are you having trouble with daily tasks like chores or shopping (OT)?  Yes, this started after my diagnosis and is still a problem.  15. Are you having trouble driving (OT)?  No  16. Are you having trouble returning to work or completing your tasks at work (OT)?  Yes, this started after my diagnosis and  is still a problem.  Other concerns: just tired all the time, aches and pains   Legend: OT = Occupational Therapy PT = Physical Therapy ST = Speech Therapy

## 2013-09-06 ENCOUNTER — Ambulatory Visit (INDEPENDENT_AMBULATORY_CARE_PROVIDER_SITE_OTHER): Payer: BC Managed Care – PPO | Admitting: Psychiatry

## 2013-09-06 DIAGNOSIS — F329 Major depressive disorder, single episode, unspecified: Secondary | ICD-10-CM

## 2013-09-06 DIAGNOSIS — F3289 Other specified depressive episodes: Secondary | ICD-10-CM

## 2013-09-06 LAB — TSH: TSH: 2.27 u[IU]/mL (ref 0.350–4.500)

## 2013-09-06 NOTE — Patient Instructions (Signed)
Discussed orally 

## 2013-09-06 NOTE — Progress Notes (Signed)
   THERAPIST PROGRESS NOTE  Session Time:  Wednesday 09/06/2013 11:10 AM - 12:00 PM  Participation Level: Active  Behavioral Response: CasualAlert/less anxious  Type of Therapy: Individual Therapy  Treatment Goals addressed:  Improve ability to manage stress and transitions without irritability and being overwhelmed   Interventions: CBT and Supportive  Summary: ANALIYAH LECHUGA is a 63 y.o. female who presents withwith symptoms of depression and anxiety that have been intermittent for several years that have been well controlled until patient was diagnosed with cancer in 2013. Symptoms have worsened in recent months as patient reports constantly thinking about her diagnoses and fears cancer will spread. She has been given a prognosis of 2-4 years to live. Patient reports taking chemotherapy pills and reports extreme fatigue. Patient also experiences depression, anxiety, and crying spells.   Patient reports decreased anxiety and decreased crying spells. She has been abe to intervene successfully negative spiraling thoughts and has been using coping statements as well as her spirituality. However, she expresses frustration that she continues to think about the future and had difficulty focusing on the present. She reports decreased doubt and fear. She is pleased with receiving favorable results from lab work yesterday. Patient continues to experience medical issues but has started using humor to react to some of the side effects of her treatment. She expresses frustration regarding fatigue but is hopeful about possibly starting a physical therapy program for cancer patients. She also expresses frustration that she grinds her teeth and reports this worsens when she is agitated.  Suicidal/Homicidal: No  Therapist Response: Therapist works with patient to process feelings, identify and reframe negative thought patterns, discuss possibility of attending cancer support group, and practice a mindfulness  technique using breath awareness.  Plan: Return again in 4 weeks.  Diagnosis: Axis I: Depressive Disorder NOS    Axis II: No diagnosis    [name], LCSW 09/06/2013

## 2013-09-07 ENCOUNTER — Other Ambulatory Visit (HOSPITAL_COMMUNITY): Payer: Self-pay | Admitting: Oncology

## 2013-09-07 DIAGNOSIS — C649 Malignant neoplasm of unspecified kidney, except renal pelvis: Secondary | ICD-10-CM

## 2013-09-07 MED ORDER — PAZOPANIB HCL 200 MG PO TABS: 800.0000 mg | ORAL_TABLET | Freq: Every day | ORAL | Status: DC

## 2013-09-15 ENCOUNTER — Encounter (HOSPITAL_COMMUNITY): Payer: PPO

## 2013-09-15 ENCOUNTER — Ambulatory Visit (HOSPITAL_COMMUNITY): Payer: Self-pay

## 2013-09-26 ENCOUNTER — Ambulatory Visit (INDEPENDENT_AMBULATORY_CARE_PROVIDER_SITE_OTHER): Payer: BC Managed Care – PPO | Admitting: Psychiatry

## 2013-09-26 ENCOUNTER — Encounter (HOSPITAL_COMMUNITY): Payer: Self-pay | Admitting: Psychiatry

## 2013-09-26 VITALS — BP 126/86 | HR 77 | Ht 65.0 in | Wt 181.0 lb

## 2013-09-26 DIAGNOSIS — F3289 Other specified depressive episodes: Secondary | ICD-10-CM

## 2013-09-26 DIAGNOSIS — F329 Major depressive disorder, single episode, unspecified: Secondary | ICD-10-CM

## 2013-09-26 DIAGNOSIS — N2889 Other specified disorders of kidney and ureter: Secondary | ICD-10-CM

## 2013-09-26 MED ORDER — PAROXETINE HCL 20 MG PO TABS: 20.0000 mg | ORAL_TABLET | Freq: Two times a day (BID) | ORAL | Status: DC

## 2013-09-26 MED ORDER — ZOLPIDEM TARTRATE 5 MG PO TABS
5.0000 mg | ORAL_TABLET | Freq: Every evening | ORAL | Status: DC | PRN
Start: 2013-09-26 — End: 2014-02-27

## 2013-09-26 MED ORDER — METHYLPHENIDATE HCL 20 MG PO TABS: 20.0000 mg | ORAL_TABLET | Freq: Two times a day (BID) | ORAL | Status: DC

## 2013-09-26 MED ORDER — ZOLPIDEM TARTRATE 5 MG PO TABS: 5.0000 mg | ORAL_TABLET | Freq: Every evening | ORAL | Status: DC | PRN
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
## 2013-09-27 ENCOUNTER — Ambulatory Visit (INDEPENDENT_AMBULATORY_CARE_PROVIDER_SITE_OTHER): Payer: BC Managed Care – PPO | Admitting: Psychiatry

## 2013-09-27 DIAGNOSIS — F3289 Other specified depressive episodes: Secondary | ICD-10-CM

## 2013-09-27 DIAGNOSIS — F329 Major depressive disorder, single episode, unspecified: Secondary | ICD-10-CM

## 2013-09-28 NOTE — Progress Notes (Signed)
   THERAPIST PROGRESS NOTE  Session Time: Wednesday 09/27/2013 4:05 PM - 4:50 PM  Participation Level: Active  Behavioral Response: CasualAlertAnxious  Type of Therapy: Individual Therapy  Treatment Goals addressed: Improve ability to manage stress and transitions with decreased irritability and feelings of being overwhelmed.  Interventions: Supportive  Summary: [name] [name] is a 63 y.o. female who presents with symptoms of depression and anxiety that have been intermittent for several years that have been well controlled until patient was diagnosed with cancer in 2013. Symptoms have worsened in recent months as patient reports constantly thinking about her diagnoses and fears cancer will spread. She has been given a prognosis of 2-4 years to live. Patient reports taking chemotherapy pills and reports extreme fatigue. Patient also experiences depression, anxiety, and crying spells.  Patient reports increased frustration and irritability since last session as she is experiencing increased fatigue. She reports staying in bed most of the time but has been trying to do errands with husband, read and attend church. She reports seeing doctors in [place] last week and being informed the size of the polyps in her lungs have increased. As a result, her medication has been changed. Patient expresses anxiety regarding this . She has obtained information regarding cancer support group and plans to begin attending group in [place] in about 2 months.when the next cycle begins. Husband remains very supportive,   Suicidal/Homicidal: No  Therapist Response: Therapist works with patient to develop treatment plan, process feelings about effects of illness, identify realistic expectations of self, identify coping statements  Plan: Return again in 3 weeks.  Diagnosis: Axis I: Depressive Disorder    Axis II: No diagnosis    Sheamus Hasting, LCSW 09/28/2013

## 2013-09-28 NOTE — Patient Instructions (Signed)
Discussed orally 
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
## 2013-10-03 ENCOUNTER — Encounter (HOSPITAL_COMMUNITY): Payer: Self-pay | Admitting: Oncology

## 2013-10-03 ENCOUNTER — Other Ambulatory Visit (HOSPITAL_COMMUNITY): Payer: Self-pay

## 2013-10-03 ENCOUNTER — Encounter (HOSPITAL_BASED_OUTPATIENT_CLINIC_OR_DEPARTMENT_OTHER): Payer: BC Managed Care – PPO

## 2013-10-03 ENCOUNTER — Ambulatory Visit (HOSPITAL_COMMUNITY): Payer: Self-pay | Admitting: Oncology

## 2013-10-03 ENCOUNTER — Encounter (HOSPITAL_COMMUNITY): Payer: BC Managed Care – PPO | Attending: Hematology and Oncology | Admitting: Oncology

## 2013-10-03 VITALS — BP 99/57 | HR 66 | Temp 97.4°F | Resp 20 | Wt 182.4 lb

## 2013-10-03 DIAGNOSIS — C801 Malignant (primary) neoplasm, unspecified: Secondary | ICD-10-CM | POA: Diagnosis present

## 2013-10-03 DIAGNOSIS — R918 Other nonspecific abnormal finding of lung field: Secondary | ICD-10-CM

## 2013-10-03 DIAGNOSIS — C649 Malignant neoplasm of unspecified kidney, except renal pelvis: Secondary | ICD-10-CM

## 2013-10-03 DIAGNOSIS — R141 Gas pain: Secondary | ICD-10-CM

## 2013-10-03 DIAGNOSIS — R5383 Other fatigue: Secondary | ICD-10-CM

## 2013-10-03 DIAGNOSIS — R5381 Other malaise: Secondary | ICD-10-CM | POA: Diagnosis present

## 2013-10-03 DIAGNOSIS — K219 Gastro-esophageal reflux disease without esophagitis: Secondary | ICD-10-CM | POA: Insufficient documentation

## 2013-10-03 DIAGNOSIS — C7800 Secondary malignant neoplasm of unspecified lung: Secondary | ICD-10-CM

## 2013-10-03 DIAGNOSIS — R142 Eructation: Secondary | ICD-10-CM

## 2013-10-03 DIAGNOSIS — Z23 Encounter for immunization: Secondary | ICD-10-CM

## 2013-10-03 DIAGNOSIS — G4700 Insomnia, unspecified: Secondary | ICD-10-CM

## 2013-10-03 DIAGNOSIS — F341 Dysthymic disorder: Secondary | ICD-10-CM

## 2013-10-03 DIAGNOSIS — R143 Flatulence: Secondary | ICD-10-CM

## 2013-10-03 LAB — URINALYSIS, ROUTINE W REFLEX MICROSCOPIC
Bilirubin Urine: NEGATIVE
Glucose, UA: NEGATIVE mg/dL
HGB URINE DIPSTICK: NEGATIVE
NITRITE: NEGATIVE
Specific Gravity, Urine: >1.03 — ABNORMAL HIGH (ref 1.005–1.030)
UROBILINOGEN UA: 1 mg/dL (ref 0.0–1.0)
pH: 5.5 (ref 5.0–8.0)

## 2013-10-03 LAB — CBC WITH DIFFERENTIAL/PLATELET
BASOS PCT: 0 % (ref 0–1)
Basophils Absolute: 0 10*3/uL (ref 0.0–0.1)
EOS ABS: 0.3 10*3/uL (ref 0.0–0.7)
EOS PCT: 4 % (ref 0–5)
HCT: 42.5 % (ref 36.0–46.0)
HEMOGLOBIN: 14.6 g/dL (ref 12.0–15.0)
LYMPHS ABS: 2.1 10*3/uL (ref 0.7–4.0)
Lymphocytes Relative: 26 % (ref 12–46)
MCH: 34.5 pg — ABNORMAL HIGH (ref 26.0–34.0)
MCHC: 34.4 g/dL (ref 30.0–36.0)
MCV: 100.5 fL — AB (ref 78.0–100.0)
MONOS PCT: 3 % (ref 3–12)
Monocytes Absolute: 0.2 10*3/uL (ref 0.1–1.0)
Neutro Abs: 5.5 10*3/uL (ref 1.7–7.7)
Neutrophils Relative %: 67 % (ref 43–77)
Platelets: 205 10*3/uL (ref 150–400)
RBC: 4.23 MIL/uL (ref 3.87–5.11)
RDW: 14.6 % (ref 11.5–15.5)
WBC: 8.2 10*3/uL (ref 4.0–10.5)

## 2013-10-03 LAB — LACTATE DEHYDROGENASE: LDH: 146 U/L (ref 94–250)

## 2013-10-03 LAB — URINE MICROSCOPIC-ADD ON

## 2013-10-03 LAB — COMPREHENSIVE METABOLIC PANEL
ALBUMIN: 3.3 g/dL — AB (ref 3.5–5.2)
ALK PHOS: 63 U/L (ref 39–117)
ALT: 17 U/L (ref 0–35)
ANION GAP: 14 (ref 5–15)
AST: 21 U/L (ref 0–37)
BUN: 25 mg/dL — ABNORMAL HIGH (ref 6–23)
CO2: 21 mEq/L (ref 19–32)
CREATININE: 1.59 mg/dL — AB (ref 0.50–1.10)
Calcium: 9.7 mg/dL (ref 8.4–10.5)
Chloride: 105 mEq/L (ref 96–112)
GFR calc non Af Amer: 33 mL/min — ABNORMAL LOW (ref >90)
GFR, EST AFRICAN AMERICAN: 39 mL/min — AB (ref >90)
GLUCOSE: 133 mg/dL — AB (ref 70–99)
Potassium: 4.6 mEq/L (ref 3.7–5.3)
Sodium: 140 mEq/L (ref 137–147)
TOTAL PROTEIN: 7 g/dL (ref 6.0–8.3)
Total Bilirubin: 0.6 mg/dL (ref 0.3–1.2)

## 2013-10-03 LAB — TSH: TSH: 2.31 u[IU]/mL (ref 0.350–4.500)

## 2013-10-03 MED ORDER — INFLUENZA VAC SPLIT QUAD 0.5 ML IM SUSY
0.5000 mL | PREFILLED_SYRINGE | Freq: Once | INTRAMUSCULAR | Status: AC
  Administered 2013-10-03: 0.5 mL via INTRAMUSCULAR

## 2013-10-03 MED ORDER — INFLUENZA VAC SPLIT QUAD 0.5 ML IM SUSY
PREFILLED_SYRINGE | INTRAMUSCULAR | Status: AC
  Filled 2013-10-03: qty 0.5

## 2013-10-03 NOTE — Progress Notes (Signed)
Labs for ua,tsh,ldh,cbcd,cmp

## 2013-10-03 NOTE — Progress Notes (Signed)
[name] [name]'s reason for visit today is for an injection [name] [name] also received fluraix  per MD orders; see [facility] for administration details.  [name] [name] tolerated all procedures well and without incident; questions were answered and patient was discharged.

## 2013-10-03 NOTE — Patient Instructions (Signed)
[facility] Discharge Instructions  RECOMMENDATIONS MADE BY THE CONSULTANT AND ANY TEST RESULTS WILL BE SENT TO YOUR REFERRING PHYSICIAN.  EXAM FINDINGS BY THE PHYSICIAN TODAY AND SIGNS OR SYMPTOMS TO REPORT TO CLINIC OR PRIMARY PHYSICIAN: You saw [name] [name] today  Please notify the clinic if you have any questions or concerns. Follow up in 4 weeks with doctor and lab  Thank you for choosing [facility] to provide your oncology and hematology care.  To afford each patient quality time with our providers, please arrive at least 15 minutes before your scheduled appointment time.  With your help, our goal is to use those 15 minutes to complete the necessary work-up to ensure our physicians have the information they need to help with your evaluation and healthcare recommendations.    Effective January 1st, 2014, we ask that you re-schedule your appointment with our physicians should you arrive 10 or more minutes late for your appointment.  We strive to give you quality time with our providers, and arriving late affects you and other patients whose appointments are after yours.    Again, thank you for choosing [facility].  Our hope is that these requests will decrease the amount of time that you wait before being seen by our physicians.       _____________________________________________________________  Should you have questions after your visit to [facility], please contact our office at [phone] between the hours of 8:30 a.m. and 5:00 p.m.  Voicemails left after 4:30 p.m. will not be returned until the following business day.  For prescription refill requests, have your pharmacy contact our office with your prescription refill request.

## 2013-10-04 ENCOUNTER — Telehealth (HOSPITAL_COMMUNITY): Payer: Self-pay | Admitting: Emergency Medicine

## 2013-10-04 NOTE — Telephone Encounter (Signed)
Pt called to check on her lab work.  I called her back to see if she had had any urinary symptoms.  She states she is not.  No treatment is needed at this time

## 2013-10-09 ENCOUNTER — Other Ambulatory Visit: Payer: Self-pay | Admitting: Physical Medicine and Rehabilitation

## 2013-10-09 ENCOUNTER — Ambulatory Visit (HOSPITAL_COMMUNITY)
Admission: RE | Admit: 2013-10-09 | Discharge: 2013-10-09 | Disposition: A | Discharge status: Home / Self Care | Payer: BC Managed Care – PPO | Source: Ambulatory Visit | Attending: Hematology and Oncology | Admitting: Hematology and Oncology

## 2013-10-09 DIAGNOSIS — M25511 Pain in right shoulder: Secondary | ICD-10-CM

## 2013-10-09 DIAGNOSIS — R29898 Other symptoms and signs involving the musculoskeletal system: Secondary | ICD-10-CM | POA: Insufficient documentation

## 2013-10-09 DIAGNOSIS — E119 Type 2 diabetes mellitus without complications: Secondary | ICD-10-CM | POA: Diagnosis not present

## 2013-10-09 DIAGNOSIS — M25519 Pain in unspecified shoulder: Secondary | ICD-10-CM | POA: Diagnosis not present

## 2013-10-09 DIAGNOSIS — I10 Essential (primary) hypertension: Secondary | ICD-10-CM | POA: Insufficient documentation

## 2013-10-09 DIAGNOSIS — IMO0001 Reserved for inherently not codable concepts without codable children: Secondary | ICD-10-CM | POA: Diagnosis not present

## 2013-10-09 DIAGNOSIS — R262 Difficulty in walking, not elsewhere classified: Secondary | ICD-10-CM | POA: Diagnosis not present

## 2013-10-09 DIAGNOSIS — R2689 Other abnormalities of gait and mobility: Secondary | ICD-10-CM
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
## 2013-10-11 ENCOUNTER — Ambulatory Visit (HOSPITAL_COMMUNITY): Admission: RE | Admit: 2013-10-11 | Payer: BC Managed Care – PPO | Source: Ambulatory Visit

## 2013-10-13 ENCOUNTER — Encounter (HOSPITAL_COMMUNITY): Payer: PPO

## 2013-10-13 ENCOUNTER — Ambulatory Visit (HOSPITAL_COMMUNITY): Payer: Self-pay

## 2013-10-16 ENCOUNTER — Inpatient Hospital Stay (HOSPITAL_COMMUNITY): Admission: RE | Admit: 2013-10-16 | Payer: Self-pay | Source: Ambulatory Visit

## 2013-10-17 ENCOUNTER — Other Ambulatory Visit: Payer: BC Managed Care – PPO

## 2013-10-17 DIAGNOSIS — I10 Essential (primary) hypertension: Secondary | ICD-10-CM

## 2013-10-17 DIAGNOSIS — Z79899 Other long term (current) drug therapy: Secondary | ICD-10-CM

## 2013-10-17 DIAGNOSIS — E785 Hyperlipidemia, unspecified: Secondary | ICD-10-CM

## 2013-10-17 DIAGNOSIS — E119 Type 2 diabetes mellitus without complications: Secondary | ICD-10-CM

## 2013-10-17 LAB — COMPLETE METABOLIC PANEL WITH GFR
ALT: 14 U/L (ref 0–35)
AST: 21 U/L (ref 0–37)
Albumin: 3.5 g/dL (ref 3.5–5.2)
Alkaline Phosphatase: 61 U/L (ref 39–117)
BUN: 18 mg/dL (ref 6–23)
CALCIUM: 8.4 mg/dL (ref 8.4–10.5)
CHLORIDE: 107 meq/L (ref 96–112)
CO2: 23 meq/L (ref 19–32)
CREATININE: 1.41 mg/dL — AB (ref 0.50–1.10)
GFR, EST AFRICAN AMERICAN: 45 mL/min — AB
GFR, Est Non African American: 39 mL/min — ABNORMAL LOW
GLUCOSE: 120 mg/dL — AB (ref 70–99)
Potassium: 4 mEq/L (ref 3.5–5.3)
Sodium: 141 mEq/L (ref 135–145)
Total Bilirubin: 0.5 mg/dL (ref 0.2–1.2)
Total Protein: 6 g/dL (ref 6.0–8.3)

## 2013-10-18 ENCOUNTER — Ambulatory Visit (INDEPENDENT_AMBULATORY_CARE_PROVIDER_SITE_OTHER): Payer: BC Managed Care – PPO | Admitting: Physician Assistant

## 2013-10-18 ENCOUNTER — Ambulatory Visit
Admission: RE | Admit: 2013-10-18 | Discharge: 2013-10-18 | Disposition: A | Discharge status: Home / Self Care | Payer: BC Managed Care – PPO | Source: Ambulatory Visit | Attending: Physical Medicine and Rehabilitation | Admitting: Physical Medicine and Rehabilitation

## 2013-10-18 ENCOUNTER — Encounter: Payer: Self-pay | Admitting: Physician Assistant

## 2013-10-18 VITALS — BP 108/72 | HR 68 | Temp 97.5°F | Resp 18 | Wt 180.0 lb

## 2013-10-18 DIAGNOSIS — M25511 Pain in right shoulder: Secondary | ICD-10-CM

## 2013-10-18 DIAGNOSIS — I10 Essential (primary) hypertension: Secondary | ICD-10-CM

## 2013-10-18 DIAGNOSIS — E119 Type 2 diabetes mellitus without complications: Secondary | ICD-10-CM

## 2013-10-18 DIAGNOSIS — E785 Hyperlipidemia, unspecified: Secondary | ICD-10-CM

## 2013-10-18 DIAGNOSIS — E039 Hypothyroidism, unspecified: Secondary | ICD-10-CM | POA: Insufficient documentation

## 2013-10-18 MED ORDER — IOHEXOL 180 MG/ML  SOLN: 15.0000 mL | Freq: Once | INTRAMUSCULAR | Status: AC | PRN
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
## 2013-10-20 ENCOUNTER — Ambulatory Visit (HOSPITAL_COMMUNITY)
Admission: RE | Admit: 2013-10-20 | Discharge: 2013-10-20 | Disposition: A | Discharge status: Home / Self Care | Payer: Worker's Compensation | Source: Ambulatory Visit | Attending: Hematology and Oncology | Admitting: Hematology and Oncology

## 2013-10-20 ENCOUNTER — Ambulatory Visit (INDEPENDENT_AMBULATORY_CARE_PROVIDER_SITE_OTHER): Payer: BC Managed Care – PPO | Admitting: Psychiatry

## 2013-10-20 DIAGNOSIS — F32A Depression, unspecified: Secondary | ICD-10-CM

## 2013-10-20 DIAGNOSIS — R262 Difficulty in walking, not elsewhere classified: Secondary | ICD-10-CM | POA: Insufficient documentation

## 2013-10-20 DIAGNOSIS — F329 Major depressive disorder, single episode, unspecified: Secondary | ICD-10-CM

## 2013-10-20 NOTE — Progress Notes (Signed)
Physical Therapy Treatment Patient Details  Name: [name] [name] MRN: [id number] Date of Birth: 08/06/1950  Today's Date: 10/20/2013 Time: 0907-1004 PT Time Calculation (min): 57 min TE 6195-0932  Visit#: 2 of 8  Re-eval: 11/08/13 Assessment Diagnosis: difficulty walking    Subjective: Symptoms/Limitations Symptoms: Pt reports she is feeling ok, just tired.  Pt reports she had an MRI on tuesday revealing a Rt RTC tear; going to see orthopedic MD next week for assessment.   Pain Assessment Currently in Pain?: Yes Pain Score: 6  Pain Location: Shoulder Pain Orientation: Right   Exercise/Treatments Stretches Passive Hamstring Stretch: 3 reps;30 seconds;Limitations Passive Hamstring Stretch Limitations: 14" Box Quad Stretch: 2 reps;30 seconds;Limitations Quad Stretch Limitations: Prone with rope Gastroc Stretch: 3 reps;30 seconds;Limitations Gastroc Stretch Limitations: Slantboard Aerobic Stationary Bike: [place] #3, Lv. 3, 5' Standing Heel Raises: 2 sets;10 reps;Limitations (10 on floor, 10 on airex) Heel Raises Limitations: HR 2x10, 10 on floor 10 on airex Lateral Step Up: 10 reps;Hand Hold: 2;Step Height: 6";Both Forward Step Up: 10 reps;Hand Hold: 2;Step Height: 6";Both Functional Squat: 10 reps SLS with Vectors: Tandem Stance, 20" x3 each Other Standing Knee Exercises: 3D Hip Excursion x10 Supine Bridges: 2 sets;5 reps Straight Leg Raises: 2 sets;10 reps Sidelying Hip ABduction: 20 reps (2x10) Prone  Hip Extension: 20 reps (2x10)      Physical Therapy Assessment and Plan PT Assessment and Plan Clinical Impression Statement: Initiated POC per PT.  Pt did report 2 occassional of lightheadedness with exercises today (after transferring from supine -> sit on the table, and during step up exercises) requiring a seated rest break.  No complaints of pain with exercises, only muscular fatigue/low activity tolerance.  Pt demonstrates decreased strength of Lt LE > Rt  LE requiring VC for technique to complete exercises on the Lt without compensations.  Pt will benefit from skilled therapeutic intervention in order to improve on the following deficits: Abnormal gait;Decreased balance;Pain;Decreased strength;Difficulty walking;Improper body mechanics;Decreased mobility Rehab Potential: Good PT Frequency: Min 2X/week PT Duration: 4 weeks PT Treatment/Interventions: Gait training PT Plan: Continue CKC exercises and balance exercises to tolerance.     Goals PT Short Term Goals PT Short Term Goal 2: Pt to be able to stand for 5 minutes to be able to do self grooming PT Short Term Goal 2 - Progress: Progressing toward goal PT Short Term Goal 3: Pt to be walking to the mailbox and back without feeling nauseated  PT Short Term Goal 3 - Progress: Progressing toward goal  Problem List Patient Active Problem List   Diagnosis Date Noted  . Thyroid activity decreased 10/18/2013  . Poor balance 10/09/2013  . Bilateral leg weakness 10/09/2013  . Pain in joint, shoulder region 03/21/2013  . Decreased range of motion of right shoulder 03/21/2013  . Muscle weakness (generalized) 03/21/2013  . Metastatic renal cell carcinoma   . Pain in thoracic spine 12/09/2011  . DDD (degenerative disc disease), lumbar 12/09/2011  . Right ankle pain 10/28/2011  . Difficulty in walking 10/28/2011  . TINEA PEDIS 04/10/2008  . Diabetes mellitus type 2, controlled 04/10/2008  . HEADACHE 04/13/2007  . ABNORMAL ELECTROCARDIOGRAM 04/13/2007  . Hyperlipemia 09/03/2006  . DEPRESSION 09/03/2006  . Essential hypertension 09/03/2006  . COPD 09/03/2006  . CONSTIPATION NOS 09/03/2006  . OSTEOARTHRITIS 09/03/2006  . LOW BACK PAIN, CHRONIC 09/03/2006  . BURSITIS, HIPS, BILATERAL 09/03/2006  . FIBROMYALGIA 09/03/2006  . FATIGUE 09/03/2006  . URINARY INCONTINENCE 09/03/2006    PT - End of Session  Activity Tolerance: Patient limited by fatigue PT Plan of Care PT Home Exercise Plan:  Updated HEP gastroc/hs/quad stretch, SLR x3, bridging, standing HR/TR   [name] [name] 10/20/2013, 10:09 AM
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
## 2013-10-20 NOTE — Patient Instructions (Signed)
Discussed orally 

## 2013-10-20 NOTE — Progress Notes (Signed)
   THERAPIST PROGRESS NOTE  Session Time: Friday 10/20/2013 1:00 PM - 1:53 PM  Participation Level: Active  Behavioral Response: CasualAlert/ less anxious, less depressed  Type of Therapy: Individual Therapy  Treatment Goals addressed: Improve ability to manage stress and transitions with decreased irritability and feelings of being overwhelmed  Interventions: CBT and Supportive  Summary: [name] [name] is a 63 y.o. female who presents with symptoms of depression and anxiety that have been intermittent for several years that have been well controlled until patient was diagnosed with cancer in 2013. Symptoms have worsened in recent months as patient reports constantly thinking about her diagnoses and fears cancer will spread. She has been given a prognosis of 2-4 years to live. Patient reports taking chemotherapy pills and reports extreme fatigue. Patient also experiences depression, anxiety, and crying spells  Patient reports feeling less anxious and less fearful. She expresses increased acceptance of diagnosis of cancer and states living with cancer. She still has moments when she cries but reports periods don't last as longe. She recently began a physical therapy and rehabilitation program for cancer patients. She has been pleased with her two visits and hopes to have more energy. Husband and family remain very supportive.   Suicidal/Homicidal: No  Therapist Response: Therapist works with patient too process feelings, identify thought patterns and effects on mood, identify ways for patient to pursue interest in reading and books (books on CD)  Plan: Return again in 4 weeks.  Diagnosis: Axis I: Depressive Disorder NOS    Axis II: Deferred    Antoria Lanza, LCSW 10/20/2013

## 2013-10-20 NOTE — Evaluation (Signed)
Note reviewed by clinical instructor and accurately reflects treatment session.  Jony Ladnier, OTR/L,CBIS   

## 2013-10-23 ENCOUNTER — Ambulatory Visit (HOSPITAL_COMMUNITY)
Admission: RE | Admit: 2013-10-23 | Discharge: 2013-10-23 | Disposition: A | Discharge status: Home / Self Care | Payer: Worker's Compensation | Source: Ambulatory Visit | Attending: Physician Assistant | Admitting: Physician Assistant

## 2013-10-23 DIAGNOSIS — R29898 Other symptoms and signs involving the musculoskeletal system: Secondary | ICD-10-CM

## 2013-10-23 DIAGNOSIS — R262 Difficulty in walking, not elsewhere classified: Secondary | ICD-10-CM | POA: Diagnosis not present

## 2013-10-23 DIAGNOSIS — R2689 Other abnormalities of gait and mobility: Secondary | ICD-10-CM

## 2013-10-23 NOTE — Progress Notes (Signed)
Physical Therapy Treatment Patient Details  Name: [name] [name] MRN: [id number] Date of Birth: 26-Dec-1950  Today's Date: 10/23/2013 Time: 4128-7867 PT Time Calculation (min): 45 min Charge:  There ex [id number]  Visit#: 3 of 8  Re-eval: 11/08/13    Authorization: BCBS  Authorization Time Period:    Authorization Visit#:   of     Subjective: Symptoms/Limitations Symptoms: Pt states that she has been doing her exercises when I think of them.  Once a day at least  Pain Assessment Currently in Pain?: Yes Pain Score: 5  Pain Location: Shoulder Pain Orientation: Right Pain Type: Chronic pain  Precautions/Restrictions  Precautions Precautions: Shoulder  Exercise/Treatments   Stretches Gastroc Stretch: 3 reps;30 seconds;Limitations Gastroc Stretch Limitations: Slantboard Aerobic Stationary Bike: Gallia #3, Lv. 4,10'' Standing Heel Raises: 10 reps;Limitations Heel Raises Limitations: no hand hold  Lateral Step Up: Step Height: 6";Limitations;5 reps Lateral Step Up Limitations: no hand hold  Forward Step Up: 10 reps;Step Height: 6";Limitations Forward Step Up Limitations: no hand hold  Functional Squat: 10 reps Rocker Board: 2 minutes;Limitations Rocker Board Limitations: Rt/Lt; Ant./ Post.  SLS with Vectors: 10" hold x 3 B  Other Standing Knee Exercises: T-band scapular retraction, rows and shld extension. x 10 @ Blue  Seated Other Seated Knee Exercises: scapular retraction x 10      Manual Therapy Manual Therapy: Myofascial release Myofascial Release: Myofascial release and manual stretching to right upper arm, trapezius, and scapularis region to decrease fascial restrictions and increase joint mobility in a pain free zone  Physical Therapy Assessment and Plan PT Assessment and Plan Clinical Impression Statement: Pt needed three short rest breaks during exercise session.  Pt encouraged to get out of bed more as pt spends most of her day in bed.  Pt completed  exercises with no hand hold to emphasize balance with therapist facilitation for proper form. PT Plan: Begin forward lunge/side lunge activity.         Problem List Patient Active Problem List   Diagnosis Date Noted  . Thyroid activity decreased 10/18/2013  . Poor balance 10/09/2013  . Bilateral leg weakness 10/09/2013  . Pain in joint, shoulder region 03/21/2013  . Decreased range of motion of right shoulder 03/21/2013  . Muscle weakness (generalized) 03/21/2013  . Metastatic renal cell carcinoma   . Pain in thoracic spine 12/09/2011  . DDD (degenerative disc disease), lumbar 12/09/2011  . Right ankle pain 10/28/2011  . Difficulty in walking 10/28/2011  . TINEA PEDIS 04/10/2008  . Diabetes mellitus type 2, controlled 04/10/2008  . HEADACHE 04/13/2007  . ABNORMAL ELECTROCARDIOGRAM 04/13/2007  . Hyperlipemia 09/03/2006  . DEPRESSION 09/03/2006  . Essential hypertension 09/03/2006  . COPD 09/03/2006  . CONSTIPATION NOS 09/03/2006  . OSTEOARTHRITIS 09/03/2006  . LOW BACK PAIN, CHRONIC 09/03/2006  . BURSITIS, HIPS, BILATERAL 09/03/2006  . FIBROMYALGIA 09/03/2006  . FATIGUE 09/03/2006  . URINARY INCONTINENCE 09/03/2006    General Behavior During Therapy: [facility] for tasks assessed/performed  GP    Senica Crall,CINDY 10/23/2013, 11:02 AM

## 2013-10-23 NOTE — Progress Notes (Signed)
Note reviewed by clinical instructor and accurately reflects treatment session.  Ziya Coonrod, OTR/L,CBIS   

## 2013-10-23 NOTE — Progress Notes (Signed)
Occupational Therapy Treatment Patient Details  Name: [name] [name] MRN: [id number] Date of Birth: 1950-08-10  Today's Date: 10/23/2013 Time: 9562-1308 OT Time Calculation (min): 33 min MFR: [id number] 11' Therex: 2952-8413 22'  Visit#: 2 of 12  Re-eval: 11/17/13   Subjective Symptoms/Limitations Symptoms: S: It's feeling ok today, not as bad as it did the other day.  Pain Assessment Currently in Pain?: Yes Pain Score: 5  Pain Location: Shoulder Pain Orientation: Right Pain Type: Chronic pain  Precautions/Restrictions  Precautions Precautions: Shoulder  Exercise/Treatments Supine Protraction: PROM;10 reps Horizontal ABduction: PROM;10 reps External Rotation: PROM;10 reps Internal Rotation: PROM;10 reps Flexion: PROM;10 reps ABduction: PROM;10 reps Seated Elevation: AROM;10 reps Extension: AROM;10 reps Row: AROM;10 reps   Therapy Ball Flexion: 10 reps ABduction: 10 reps      Manual Therapy Manual Therapy: Myofascial release Myofascial Release: Myofascial release and manual stretching to right upper arm, trapezius, and scapularis region to decrease fascial restrictions and increase joint mobility in a pain free zone  Occupational Therapy Assessment and Plan OT Assessment and Plan Clinical Impression Statement: A: Initiated myofascial release, manual stretching, and PROM exercises. Patient tolerated treatment well. Patient reported being "swimmy headed" upon arrival, felt better when in supine and sitting versus standing/moving.  Provided towel slide HEP.  OT Plan: P: Follow up on exercises. Continue working to increase PROM.    Goals Short Term Goals Time to Complete Short Term Goals: 3 weeks Short Term Goal 1: Patient will be educated on HEP. Short Term Goal 1 Progress: Progressing toward goal Short Term Goal 2: Patient will decrease pain to 4/10 during daily tasks.  Short Term Goal 2 Progress: Progressing toward goal Short Term Goal 3: Patient will decrease  fascial restrictions from mod-min.  Short Term Goal 3 Progress: Progressing toward goal Short Term Goal 4: Patient will increase AROM to [facility] to increase ability to reach items above shoulder level.   Short Term Goal 4 Progress: Progressing toward goal Short Term Goal 5: Patient will increase strength to 3/5 to increase ability to donn shirt.  Short Term Goal 5 Progress: Progressing toward goal Long Term Goals Time to Complete Long Term Goals: 6 weeks Long Term Goal 1: Patient will return to highest level of functioning/independence in all daily and leisure tasks.  Long Term Goal 1 Progress: Progressing toward goal Long Term Goal 2: Patient will decrease pain to 2/10 or less during daily tasks.  Long Term Goal 2 Progress: Progressing toward goal Long Term Goal 3: Patient will decrease fascial restrictions from min to trace amount or less.  Long Term Goal 3 Progress: Progressing toward goal Long Term Goal 4: Patient will increase AROM to WNL to increase ability to reach into overhead cabinets.  Long Term Goal 4 Progress: Progressing toward goal Long Term Goal 5: Patient will increase strength to 4/5 to increase ability to carry multiple items of clothing at once.  Long Term Goal 5 Progress: Progressing toward goal  Problem List Patient Active Problem List   Diagnosis Date Noted  . Thyroid activity decreased 10/18/2013  . Poor balance 10/09/2013  . Bilateral leg weakness 10/09/2013  . Pain in joint, shoulder region 03/21/2013  . Decreased range of motion of right shoulder 03/21/2013  . Muscle weakness (generalized) 03/21/2013  . Metastatic renal cell carcinoma   . Pain in thoracic spine 12/09/2011  . DDD (degenerative disc disease), lumbar 12/09/2011  . Right ankle pain 10/28/2011  . Difficulty in walking 10/28/2011  . TINEA PEDIS 04/10/2008  . Diabetes  mellitus type 2, controlled 04/10/2008  . HEADACHE 04/13/2007  . ABNORMAL ELECTROCARDIOGRAM 04/13/2007  . Hyperlipemia 09/03/2006   . DEPRESSION 09/03/2006  . Essential hypertension 09/03/2006  . COPD 09/03/2006  . CONSTIPATION NOS 09/03/2006  . OSTEOARTHRITIS 09/03/2006  . LOW BACK PAIN, CHRONIC 09/03/2006  . BURSITIS, HIPS, BILATERAL 09/03/2006  . FIBROMYALGIA 09/03/2006  . FATIGUE 09/03/2006  . URINARY INCONTINENCE 09/03/2006    End of Session Activity Tolerance: Patient tolerated treatment well General Behavior During Therapy: [facility] for tasks assessed/performed OT Plan of Care OT Home Exercise Plan: towel slides OT Patient Instructions: handout (scanned) Consulted and Agree with Plan of Care: Patient   Guadelupe Sabin. OT Student  10/23/2013, 11:03 AM

## 2013-10-25 ENCOUNTER — Ambulatory Visit (INDEPENDENT_AMBULATORY_CARE_PROVIDER_SITE_OTHER): Payer: BC Managed Care – PPO | Admitting: Psychiatry

## 2013-10-25 ENCOUNTER — Encounter (HOSPITAL_COMMUNITY): Payer: Self-pay | Admitting: Psychiatry

## 2013-10-25 VITALS — BP 124/80 | Ht 65.0 in | Wt 180.0 lb

## 2013-10-25 DIAGNOSIS — F32A Depression, unspecified: Secondary | ICD-10-CM

## 2013-10-25 DIAGNOSIS — N2889 Other specified disorders of kidney and ureter: Secondary | ICD-10-CM

## 2013-10-25 DIAGNOSIS — F329 Major depressive disorder, single episode, unspecified: Secondary | ICD-10-CM

## 2013-10-25 MED ORDER — METHYLPHENIDATE HCL 20 MG PO TABS: 20.0000 mg | ORAL_TABLET | Freq: Two times a day (BID) | ORAL | Status: DC

## 2013-10-25 MED ORDER — PAROXETINE HCL 20 MG PO TABS: 20.0000 mg | ORAL_TABLET | Freq: Two times a day (BID) | ORAL | Status: DC
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
## 2013-10-26 ENCOUNTER — Telehealth (HOSPITAL_COMMUNITY): Payer: Self-pay

## 2013-10-26 ENCOUNTER — Ambulatory Visit (HOSPITAL_COMMUNITY)
Admission: RE | Admit: 2013-10-26 | Discharge: 2013-10-26 | Disposition: A | Discharge status: Home / Self Care | Payer: Worker's Compensation | Source: Ambulatory Visit | Attending: Hematology and Oncology | Admitting: Hematology and Oncology

## 2013-10-26 DIAGNOSIS — R262 Difficulty in walking, not elsewhere classified: Secondary | ICD-10-CM | POA: Diagnosis not present

## 2013-10-26 DIAGNOSIS — R29898 Other symptoms and signs involving the musculoskeletal system: Secondary | ICD-10-CM

## 2013-10-26 DIAGNOSIS — R2689 Other abnormalities of gait and mobility: Secondary | ICD-10-CM

## 2013-10-26 NOTE — Telephone Encounter (Signed)
[name] [name] is going to have to have shoulder surgery again on 10/26.  Wants to know if and when she should stop the votrient and when to restart.

## 2013-10-26 NOTE — Telephone Encounter (Signed)
Message copied by Mellissa Kohut on Thu Oct 26, 2013  3:47 PM ------      Message from: [place], [place]: Thu Oct 26, 2013  3:43 PM       There exists about a 15% risk of hemorrhage with Votrient. Suggest she stop the drug 7 days before surgery and restart 3 days after being discharged from the hospital after surgery. ------

## 2013-10-26 NOTE — Telephone Encounter (Signed)
Patient notified and verbalized understanding of instructions. 

## 2013-10-26 NOTE — Progress Notes (Signed)
Physical Therapy Treatment Patient Details  Name: [name] [name] MRN: [id number] Date of Birth: 1950/10/09  Today's Date: 10/26/2013 Time: 1020-1102 PT Time Calculation (min): 42 min    Charges: 1020-1031 Gait training, 1031-1102 Visit#: 4 of 8  Re-eval: 11/08/13  Authorization: BCBS   Subjective: Symptoms/Limitations Symptoms: Patient states she feels unsteady while walking, pain only in shoulder (being addressed by OT), no LE pain. Patient notes fatigue following 85minutes of gait training. Pain Assessment Currently in Pain?: Yes Pain Score: 5  Pain Location: Shoulder Pain Orientation: Right Pain Type: Chronic pain  Precautions/Restrictions  Precautions Precautions: Shoulder  Exercise/Treatments Stretches Passive Hamstring Stretch: 2 reps;20 seconds Passive Hamstring Stretch Limitations: 14" Box Gastroc Stretch: 2 reps;20 seconds Gastroc Stretch Limitations: Slantboard Aerobic Stationary Bike: McNeil #3, Lv. 4,10'' Standing Heel Raises: 10 reps;Limitations Heel Raises Limitations: heel raise to toe raise no hand hold  Lateral Step Up: Step Height: 6";Limitations;10 reps Lateral Step Up Limitations: no hand hold  Forward Step Up: 10 reps;Step Height: 6";Limitations Forward Step Up Limitations: no hand hold 5x 6", 5x8" Functional Squat: 10 reps;Limitations Functional Squat Limitations: also 5x split stance each Gait Training: Walking 181ft, 43ft high knees, 63ft high heel, 30 ft side stepping Lt and Rt, walking lunges 37ft back wards walking 2ft.   Manual Therapy Manual Therapy: Myofascial release Myofascial Release: Myofascial release and manual stretching to right upper arm, trapezius, and scapularis region to decrease fascial restrictions and increase joint mobility in a pain free zone  Physical Therapy Assessment and Plan PT Assessment and Plan Clinical Impression Statement: Patient required multiple rest breaks throughout session secondary to fatigue,  though all exercises were performed at a low intensity. Patient noted that she sometimes gets dizzy with excessive activity so caution was taken during session to allow for adequate rest. Patient dmeosntrat3ed good perofrmance of forwardlunge walsk with correct techniqued thoughj she demsontrated limited stride length secondary to limited strength/stability PT Plan: Begin forward lunge/side lunge activity next session.     Goals PT Short Term Goals PT Short Term Goal 1: Pt to be able to sit with comfort for 45 minutes in order to go our to dinner PT Short Term Goal 1 - Progress: Progressing toward goal PT Short Term Goal 2: Pt to be able to stand for 5 minutes to be able to do self grooming PT Short Term Goal 2 - Progress: Progressing toward goal PT Short Term Goal 3: Pt to be walking to the mailbox and back without feeling nauseated  PT Short Term Goal 3 - Progress: Progressing toward goal  Problem List Patient Active Problem List   Diagnosis Date Noted  . Thyroid activity decreased 10/18/2013  . Poor balance 10/09/2013  . Bilateral leg weakness 10/09/2013  . Pain in joint, shoulder region 03/21/2013  . Decreased range of motion of right shoulder 03/21/2013  . Muscle weakness (generalized) 03/21/2013  . Metastatic renal cell carcinoma   . Pain in thoracic spine 12/09/2011  . DDD (degenerative disc disease), lumbar 12/09/2011  . Right ankle pain 10/28/2011  . Difficulty in walking 10/28/2011  . TINEA PEDIS 04/10/2008  . Diabetes mellitus type 2, controlled 04/10/2008  . HEADACHE 04/13/2007  . ABNORMAL ELECTROCARDIOGRAM 04/13/2007  . Hyperlipemia 09/03/2006  . DEPRESSION 09/03/2006  . Essential hypertension 09/03/2006  . COPD 09/03/2006  . CONSTIPATION NOS 09/03/2006  . OSTEOARTHRITIS 09/03/2006  . LOW BACK PAIN, CHRONIC 09/03/2006  . BURSITIS, HIPS, BILATERAL 09/03/2006  . FIBROMYALGIA 09/03/2006  . FATIGUE 09/03/2006  . URINARY INCONTINENCE  09/03/2006    PT - End of  Session Activity Tolerance: Patient limited by fatigue General Behavior During Therapy: [facility] for tasks assessed/performed  GP    Angellica Maddison R 10/26/2013, 10:55 AM

## 2013-10-26 NOTE — Progress Notes (Signed)
Note reviewed by clinical instructor and accurately reflects treatment session.  Laura Essenmacher, OTR/L,CBIS   

## 2013-10-26 NOTE — Progress Notes (Signed)
Occupational Therapy Treatment Patient Details  Name: [name] [name] MRN: [id number] Date of Birth: Oct 30, 1950  Today's Date: 10/26/2013 Time: 9381-0175 OT Time Calculation (min): 33 min MFR: 1025-8527 14' Therex: 7824-2353 19'  Visit#: 3 of 12  Re-eval: 11/17/13   Subjective Symptoms/Limitations Symptoms: S: It's not gone away but it's not hurting as bad as it could be.  Pain Assessment Currently in Pain?: Yes Pain Score: 5  Pain Location: Shoulder Pain Orientation: Right Pain Type: Chronic pain  Precautions/Restrictions  Precautions Precautions: Shoulder  Exercise/Treatments Supine Protraction: PROM;10 reps Horizontal ABduction: PROM;10 reps External Rotation: PROM;10 reps Internal Rotation: PROM;10 reps Flexion: PROM;10 reps ABduction: PROM;10 reps Seated Elevation: AROM;10 reps Extension: AROM;10 reps Row: AROM;10 reps   Therapy Ball Flexion: 10 reps ABduction: 10 reps    Manual Therapy Manual Therapy: Myofascial release Myofascial Release: Myofascial release and manual stretching to right upper arm, trapezius, and scapularis region to decrease fascial restrictions and increase joint mobility in a pain free zone  Occupational Therapy Assessment and Plan OT Assessment and Plan Clinical Impression Statement: A: Continued PROM, and scapular exercises. Patient tolerated treatment well. Patient reports she is having rotator cuff repair surgery on 11/06/2013. She will continue therapy next week and return when cleared after her surgery.  OT Plan: P: Increase AROM scapular exercises to 15. Continue working on increasing PROM.    Goals Short Term Goals Time to Complete Short Term Goals: 3 weeks Short Term Goal 1: Patient will be educated on HEP. Short Term Goal 2: Patient will decrease pain to 4/10 during daily tasks.  Short Term Goal 3: Patient will decrease fascial restrictions from mod-min.  Short Term Goal 4: Patient will increase AROM to [facility] to increase  ability to reach items above shoulder level.   Short Term Goal 5: Patient will increase strength to 3/5 to increase ability to donn shirt.  Long Term Goals Time to Complete Long Term Goals: 6 weeks Long Term Goal 1: Patient will return to highest level of functioning/independence in all daily and leisure tasks.  Long Term Goal 2: Patient will decrease pain to 2/10 or less during daily tasks.  Long Term Goal 3: Patient will decrease fascial restrictions from min to trace amount or less.  Long Term Goal 4: Patient will increase AROM to WNL to increase ability to reach into overhead cabinets.  Long Term Goal 5: Patient will increase strength to 4/5 to increase ability to carry multiple items of clothing at once.   Problem List Patient Active Problem List   Diagnosis Date Noted  . Thyroid activity decreased 10/18/2013  . Poor balance 10/09/2013  . Bilateral leg weakness 10/09/2013  . Pain in joint, shoulder region 03/21/2013  . Decreased range of motion of right shoulder 03/21/2013  . Muscle weakness (generalized) 03/21/2013  . Metastatic renal cell carcinoma   . Pain in thoracic spine 12/09/2011  . DDD (degenerative disc disease), lumbar 12/09/2011  . Right ankle pain 10/28/2011  . Difficulty in walking 10/28/2011  . TINEA PEDIS 04/10/2008  . Diabetes mellitus type 2, controlled 04/10/2008  . HEADACHE 04/13/2007  . ABNORMAL ELECTROCARDIOGRAM 04/13/2007  . Hyperlipemia 09/03/2006  . DEPRESSION 09/03/2006  . Essential hypertension 09/03/2006  . COPD 09/03/2006  . CONSTIPATION NOS 09/03/2006  . OSTEOARTHRITIS 09/03/2006  . LOW BACK PAIN, CHRONIC 09/03/2006  . BURSITIS, HIPS, BILATERAL 09/03/2006  . FIBROMYALGIA 09/03/2006  . FATIGUE 09/03/2006  . URINARY INCONTINENCE 09/03/2006    End of Session Activity Tolerance: Patient tolerated treatment well General  Behavior During Therapy: [facility] for tasks assessed/performed   Guadelupe Sabin. OT Student  10/26/2013, 11:58 AM

## 2013-10-27 ENCOUNTER — Other Ambulatory Visit: Payer: Self-pay

## 2013-10-30 ENCOUNTER — Inpatient Hospital Stay (HOSPITAL_COMMUNITY): Admission: RE | Admit: 2013-10-30 | Payer: Self-pay | Source: Ambulatory Visit

## 2013-10-30 ENCOUNTER — Ambulatory Visit (HOSPITAL_COMMUNITY): Payer: Self-pay

## 2013-10-30 ENCOUNTER — Telehealth (HOSPITAL_COMMUNITY): Payer: Self-pay

## 2013-10-30 ENCOUNTER — Other Ambulatory Visit (HOSPITAL_COMMUNITY): Payer: Self-pay | Admitting: Oncology

## 2013-10-30 DIAGNOSIS — C649 Malignant neoplasm of unspecified kidney, except renal pelvis: Secondary | ICD-10-CM

## 2013-10-30 MED ORDER — PAZOPANIB HCL 200 MG PO TABS: 600.0000 mg | ORAL_TABLET | Freq: Every day | ORAL | Status: DC

## 2013-10-30 NOTE — Telephone Encounter (Signed)
Decided to cx this week - doesn't want to do therapy before surgery

## 2013-10-30 NOTE — Telephone Encounter (Signed)
Call from pharmacy requesting new prescription for Votrient since dosage was changed.  Can be called to [phone] option 5 or faxed to [phone].

## 2013-10-31 ENCOUNTER — Other Ambulatory Visit (HOSPITAL_COMMUNITY): Payer: BC Managed Care – PPO

## 2013-11-01 ENCOUNTER — Other Ambulatory Visit (HOSPITAL_COMMUNITY): Payer: Self-pay | Admitting: Oncology

## 2013-11-01 DIAGNOSIS — E039 Hypothyroidism, unspecified: Secondary | ICD-10-CM

## 2013-11-01 MED ORDER — LEVOTHYROXINE SODIUM 50 MCG PO TABS: 50.0000 ug | ORAL_TABLET | Freq: Every day | ORAL | Status: DC

## 2013-11-02 ENCOUNTER — Ambulatory Visit (HOSPITAL_COMMUNITY): Payer: Self-pay | Admitting: Physical Therapy

## 2013-11-02 ENCOUNTER — Inpatient Hospital Stay (HOSPITAL_COMMUNITY): Admission: RE | Admit: 2013-11-02 | Payer: Self-pay | Source: Ambulatory Visit | Admitting: Specialist

## 2013-11-06 ENCOUNTER — Ambulatory Visit (HOSPITAL_COMMUNITY): Payer: Self-pay
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
## 2013-11-08 ENCOUNTER — Ambulatory Visit (HOSPITAL_COMMUNITY): Payer: Self-pay | Admitting: Physical Therapy

## 2013-11-09 ENCOUNTER — Encounter (HOSPITAL_COMMUNITY): Payer: BC Managed Care – PPO

## 2013-11-09 ENCOUNTER — Encounter (HOSPITAL_COMMUNITY): Payer: BC Managed Care – PPO | Attending: Hematology and Oncology | Admitting: Oncology

## 2013-11-09 ENCOUNTER — Encounter (HOSPITAL_COMMUNITY): Payer: Self-pay | Admitting: Oncology

## 2013-11-09 VITALS — BP 111/53 | HR 79 | Temp 98.5°F | Resp 18 | Wt 191.1 lb

## 2013-11-09 DIAGNOSIS — C649 Malignant neoplasm of unspecified kidney, except renal pelvis: Secondary | ICD-10-CM | POA: Diagnosis present

## 2013-11-09 DIAGNOSIS — K219 Gastro-esophageal reflux disease without esophagitis: Secondary | ICD-10-CM | POA: Insufficient documentation

## 2013-11-09 DIAGNOSIS — C7800 Secondary malignant neoplasm of unspecified lung: Secondary | ICD-10-CM

## 2013-11-09 DIAGNOSIS — R5383 Other fatigue: Secondary | ICD-10-CM

## 2013-11-09 DIAGNOSIS — R918 Other nonspecific abnormal finding of lung field: Secondary | ICD-10-CM | POA: Diagnosis present

## 2013-11-09 LAB — COMPREHENSIVE METABOLIC PANEL
ALK PHOS: 161 U/L — AB (ref 39–117)
ALT: 114 U/L — ABNORMAL HIGH (ref 0–35)
AST: 124 U/L — ABNORMAL HIGH (ref 0–37)
Albumin: 3 g/dL — ABNORMAL LOW (ref 3.5–5.2)
Anion gap: 14 (ref 5–15)
BUN: 18 mg/dL (ref 6–23)
CO2: 23 mEq/L (ref 19–32)
Calcium: 9.7 mg/dL (ref 8.4–10.5)
Chloride: 107 mEq/L (ref 96–112)
Creatinine, Ser: 1.26 mg/dL — ABNORMAL HIGH (ref 0.50–1.10)
GFR calc Af Amer: 51 mL/min — ABNORMAL LOW (ref >90)
GFR calc non Af Amer: 44 mL/min — ABNORMAL LOW (ref >90)
Glucose, Bld: 133 mg/dL — ABNORMAL HIGH (ref 70–99)
POTASSIUM: 4.3 meq/L (ref 3.7–5.3)
Sodium: 144 mEq/L (ref 137–147)
Total Bilirubin: 0.4 mg/dL (ref 0.3–1.2)
Total Protein: 6.7 g/dL (ref 6.0–8.3)

## 2013-11-09 LAB — URINALYSIS, ROUTINE W REFLEX MICROSCOPIC
Bilirubin Urine: NEGATIVE
Glucose, UA: NEGATIVE mg/dL
HGB URINE DIPSTICK: NEGATIVE
Ketones, ur: NEGATIVE mg/dL
Leukocytes, UA: NEGATIVE
Nitrite: NEGATIVE
Urobilinogen, UA: 1 mg/dL (ref 0.0–1.0)
pH: 5.5 (ref 5.0–8.0)

## 2013-11-09 LAB — CBC WITH DIFFERENTIAL/PLATELET
BASOS PCT: 0 % (ref 0–1)
Basophils Absolute: 0 10*3/uL (ref 0.0–0.1)
EOS ABS: 0.4 10*3/uL (ref 0.0–0.7)
Eosinophils Relative: 7 % — ABNORMAL HIGH (ref 0–5)
HCT: 38.9 % (ref 36.0–46.0)
Hemoglobin: 12.6 g/dL (ref 12.0–15.0)
Lymphocytes Relative: 19 % (ref 12–46)
Lymphs Abs: 1.2 10*3/uL (ref 0.7–4.0)
MCH: 33.5 pg (ref 26.0–34.0)
MCHC: 32.4 g/dL (ref 30.0–36.0)
MCV: 103.5 fL — ABNORMAL HIGH (ref 78.0–100.0)
Monocytes Absolute: 0.3 10*3/uL (ref 0.1–1.0)
Monocytes Relative: 5 % (ref 3–12)
Neutro Abs: 4.3 10*3/uL (ref 1.7–7.7)
Neutrophils Relative %: 69 % (ref 43–77)
PLATELETS: 237 10*3/uL (ref 150–400)
RBC: 3.76 MIL/uL — AB (ref 3.87–5.11)
RDW: 14.5 % (ref 11.5–15.5)
WBC: 6.1 10*3/uL (ref 4.0–10.5)

## 2013-11-09 LAB — LACTATE DEHYDROGENASE: LDH: 173 U/L (ref 94–250)

## 2013-11-09 LAB — TSH: TSH: 3.13 u[IU]/mL (ref 0.350–4.500)

## 2013-11-09 LAB — URINE MICROSCOPIC-ADD ON

## 2013-11-09 NOTE — Patient Instructions (Signed)
[facility] Discharge Instructions  RECOMMENDATIONS MADE BY THE CONSULTANT AND ANY TEST RESULTS WILL BE SENT TO YOUR REFERRING PHYSICIAN.  MEDICATIONS PRESCRIBED:  none  INSTRUCTIONS GIVEN AND DISCUSSED: Restart Votrient 600 mg tonight as planned.  Continue medication daily.  SPECIAL INSTRUCTIONS/FOLLOW-UP: Good luck in the recovery of your shoulder surgery. Return in 4 weeks for follow-up  Thank you for choosing [facility] to provide your oncology and hematology care.  To afford each patient quality time with our providers, please arrive at least 15 minutes before your scheduled appointment time.  With your help, our goal is to use those 15 minutes to complete the necessary work-up to ensure our physicians have the information they need to help with your evaluation and healthcare recommendations.    Effective January 1st, 2014, we ask that you re-schedule your appointment with our physicians should you arrive 10 or more minutes late for your appointment.  We strive to give you quality time with our providers, and arriving late affects you and other patients whose appointments are after yours.    Again, thank you for choosing [facility].  Our hope is that these requests will decrease the amount of time that you wait before being seen by our physicians.       _____________________________________________________________  Should you have questions after your visit to [facility], please contact our office at [phone] between the hours of 8:30 a.m. and 5:00 p.m.  Voicemails left after 4:30 p.m. will not be returned until the following business day.  For prescription refill requests, have your pharmacy contact our office with your prescription refill request.

## 2013-11-09 NOTE — Progress Notes (Signed)
LABS FOR TSH,LDH,CMP,CBCD, UA

## 2013-11-13 ENCOUNTER — Other Ambulatory Visit: Payer: Self-pay | Admitting: Physician Assistant

## 2013-11-13 NOTE — Telephone Encounter (Signed)
Medication refilled per protocol. 

## 2013-11-21 ENCOUNTER — Ambulatory Visit (HOSPITAL_COMMUNITY)
Admission: RE | Admit: 2013-11-21 | Discharge: 2013-11-21 | Disposition: A | Discharge status: Home / Self Care | Payer: Worker's Compensation | Source: Ambulatory Visit | Attending: Hematology and Oncology | Admitting: Hematology and Oncology

## 2013-11-21 ENCOUNTER — Encounter (HOSPITAL_COMMUNITY): Payer: Self-pay

## 2013-11-21 DIAGNOSIS — R262 Difficulty in walking, not elsewhere classified: Secondary | ICD-10-CM | POA: Diagnosis not present

## 2013-11-21 DIAGNOSIS — M6281 Muscle weakness (generalized): Secondary | ICD-10-CM

## 2013-11-21 DIAGNOSIS — M25611 Stiffness of right shoulder, not elsewhere classified: Secondary | ICD-10-CM

## 2013-11-21 DIAGNOSIS — M25511 Pain in right shoulder: Secondary | ICD-10-CM

## 2013-11-21 NOTE — Addendum Note (Signed)
Encounter addended by: Debby Bud, OT on: 11/21/2013  4:48 PM<BR>     Documentation filed: Fast Note

## 2013-11-21 NOTE — Addendum Note (Signed)
Encounter addended by: Debby Bud, OT on: 11/21/2013  5:01 PM<BR>     Documentation filed: Patient Instructions Section
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
## 2013-11-21 NOTE — Patient Instructions (Addendum)
ELBOW: Extension - Sitting (Table Top)   Rest arm on table, elbow bent. Straighten elbow. ___ reps per set, ___ sets per day, ___ days per week Use towel or pillowcase under arm as needed.  Copyright  VHI. All rights reserved.   SHOULDER: Flexion On Table   Place hands on table, elbows straight. Move hips away from body. Press hands down into table. Hold ___ seconds. ___ reps per set, ___ sets per day, ___ days per week   Copyright  VHI. All rights reserved.

## 2013-11-23 ENCOUNTER — Encounter (HOSPITAL_COMMUNITY): Payer: Self-pay

## 2013-11-23 ENCOUNTER — Ambulatory Visit (INDEPENDENT_AMBULATORY_CARE_PROVIDER_SITE_OTHER): Payer: Medicare HMO | Admitting: Psychiatry

## 2013-11-23 ENCOUNTER — Ambulatory Visit (HOSPITAL_COMMUNITY)
Admission: RE | Admit: 2013-11-23 | Discharge: 2013-11-23 | Disposition: A | Discharge status: Home / Self Care | Payer: Worker's Compensation | Source: Ambulatory Visit | Attending: Physician Assistant | Admitting: Physician Assistant

## 2013-11-23 DIAGNOSIS — M25511 Pain in right shoulder: Secondary | ICD-10-CM

## 2013-11-23 DIAGNOSIS — F32A Depression, unspecified: Secondary | ICD-10-CM

## 2013-11-23 DIAGNOSIS — M6281 Muscle weakness (generalized): Secondary | ICD-10-CM

## 2013-11-23 DIAGNOSIS — F329 Major depressive disorder, single episode, unspecified: Secondary | ICD-10-CM

## 2013-11-23 DIAGNOSIS — M25611 Stiffness of right shoulder, not elsewhere classified: Secondary | ICD-10-CM

## 2013-11-23 DIAGNOSIS — R262 Difficulty in walking, not elsewhere classified: Secondary | ICD-10-CM | POA: Diagnosis not present

## 2013-11-23 NOTE — Patient Instructions (Signed)
Discussed orally 

## 2013-11-23 NOTE — Patient Instructions (Signed)
The Twist   While wearing sling, Twist hips with-out moving legs. Allow shoulder to move with the twist, rather than remaining stiff. Do 5 times. Increase repetitions gradually up to 15. Repeat as able throughout the day.  Copyright  VHI. All rights reserved.   Bea Graff Elyan Vanwieren, MS, OTR/L [place]

## 2013-11-23 NOTE — Progress Notes (Signed)
     THERAPIST PROGRESS NOTE  Session Time: Thursday 11/23/2013 1:10 PM - 2:55 PM  Participation Level: Active  Behavioral Response: CasualAlert/ less anxious, less depressed/anxious  Type of Therapy: Individual Therapy  Treatment Goals addressed: Improve ability to manage stress and transitions with decreased irritability and feelings of being overwhelmed  Interventions: CBT and Supportive  Summary: [name] [name] is a 63 y.o. female who presents with symptoms of depression and anxiety that have been intermittent for several years that have been well controlled until patient was diagnosed with cancer in 2013. Symptoms have worsened in recent months as patient reports constantly thinking about her diagnoses and fears cancer will spread. She has been given a prognosis of 2-4 years to live. Patient reports taking chemotherapy pills and reports extreme fatigue. Patient also experiences depression, anxiety, and crying spells  Patient reports increased stress as she and her family have been managing an infestation of bed bugs and recently had house treated professionally but found more bed bugs last night. This has created finanical stress as well as disruption to patient's and family's routine and sense of normalcy. Patient stayed with sister for a few days and now has to consider staying with her again due to bug problem. Patient also expresses worry about her children's and grandchildren's choices and expresses regret over her past choices in her interation with one of her granddaughters. Patient also had surgery again on rotator cuff 10 days ago and now is attending therapy. This has interrupted her participation in physical therapy for cancer patients but she plans to resume in January 2016.  Suicidal/Homicidal: No  Therapist Response: Therapist works with patient too process feelings, identify ways to limit excessive worry , discuss boundary issues, identify coping statements  Plan: Return  again in 4 weeks.  Diagnosis: Axis I: Depressive Disorder NOS    Axis II: Deferred    BYNUM,PEGGY, LCSW 11/23/2013   BYNUM,PEGGY, LCSW 11/23/2013
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
## 2013-11-28 ENCOUNTER — Ambulatory Visit (HOSPITAL_COMMUNITY)
Admission: RE | Admit: 2013-11-28 | Discharge: 2013-11-28 | Disposition: A | Discharge status: Home / Self Care | Payer: Worker's Compensation | Source: Ambulatory Visit | Attending: Physician Assistant | Admitting: Physician Assistant

## 2013-11-28 ENCOUNTER — Encounter (HOSPITAL_COMMUNITY): Payer: Self-pay

## 2013-11-28 DIAGNOSIS — R262 Difficulty in walking, not elsewhere classified: Secondary | ICD-10-CM | POA: Diagnosis not present

## 2013-11-28 DIAGNOSIS — M25511 Pain in right shoulder: Secondary | ICD-10-CM

## 2013-11-28 DIAGNOSIS — M6281 Muscle weakness (generalized): Secondary | ICD-10-CM

## 2013-11-28 DIAGNOSIS — M25611 Stiffness of right shoulder, not elsewhere classified: Secondary | ICD-10-CM
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
## 2013-11-30 ENCOUNTER — Ambulatory Visit (HOSPITAL_COMMUNITY): Payer: Worker's Compensation
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
## 2013-12-04 ENCOUNTER — Ambulatory Visit (HOSPITAL_COMMUNITY)
Admission: RE | Admit: 2013-12-04 | Discharge: 2013-12-04 | Disposition: A | Discharge status: Home / Self Care | Payer: Worker's Compensation | Source: Ambulatory Visit | Attending: Physician Assistant | Admitting: Physician Assistant

## 2013-12-04 ENCOUNTER — Encounter (HOSPITAL_COMMUNITY): Payer: Self-pay

## 2013-12-04 DIAGNOSIS — M6281 Muscle weakness (generalized): Secondary | ICD-10-CM

## 2013-12-04 DIAGNOSIS — M25511 Pain in right shoulder: Secondary | ICD-10-CM

## 2013-12-04 DIAGNOSIS — R262 Difficulty in walking, not elsewhere classified: Secondary | ICD-10-CM | POA: Diagnosis not present

## 2013-12-04 DIAGNOSIS — M25611 Stiffness of right shoulder, not elsewhere classified: Secondary | ICD-10-CM
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
## 2013-12-05 ENCOUNTER — Encounter (HOSPITAL_COMMUNITY): Payer: BC Managed Care – PPO | Attending: Hematology and Oncology | Admitting: Oncology

## 2013-12-05 ENCOUNTER — Encounter (HOSPITAL_COMMUNITY): Payer: Self-pay | Admitting: Oncology

## 2013-12-05 ENCOUNTER — Encounter (HOSPITAL_BASED_OUTPATIENT_CLINIC_OR_DEPARTMENT_OTHER): Payer: BC Managed Care – PPO

## 2013-12-05 VITALS — BP 138/64 | HR 76 | Temp 97.4°F | Resp 18 | Wt 180.1 lb

## 2013-12-05 DIAGNOSIS — K219 Gastro-esophageal reflux disease without esophagitis: Secondary | ICD-10-CM | POA: Insufficient documentation

## 2013-12-05 DIAGNOSIS — R5383 Other fatigue: Secondary | ICD-10-CM | POA: Diagnosis present

## 2013-12-05 DIAGNOSIS — C649 Malignant neoplasm of unspecified kidney, except renal pelvis: Secondary | ICD-10-CM | POA: Insufficient documentation

## 2013-12-05 DIAGNOSIS — C7800 Secondary malignant neoplasm of unspecified lung: Secondary | ICD-10-CM | POA: Diagnosis not present

## 2013-12-05 DIAGNOSIS — R918 Other nonspecific abnormal finding of lung field: Secondary | ICD-10-CM | POA: Diagnosis present

## 2013-12-05 LAB — CBC WITH DIFFERENTIAL/PLATELET
Basophils Absolute: 0 10*3/uL (ref 0.0–0.1)
Basophils Relative: 0 % (ref 0–1)
Eosinophils Absolute: 0.3 10*3/uL (ref 0.0–0.7)
Eosinophils Relative: 6 % — ABNORMAL HIGH (ref 0–5)
HCT: 42.6 % (ref 36.0–46.0)
Hemoglobin: 14.3 g/dL (ref 12.0–15.0)
Lymphocytes Relative: 35 % (ref 12–46)
Lymphs Abs: 1.9 10*3/uL (ref 0.7–4.0)
MCH: 33.4 pg (ref 26.0–34.0)
MCHC: 33.6 g/dL (ref 30.0–36.0)
MCV: 99.5 fL (ref 78.0–100.0)
MONO ABS: 0.3 10*3/uL (ref 0.1–1.0)
MONOS PCT: 5 % (ref 3–12)
Neutro Abs: 2.9 10*3/uL (ref 1.7–7.7)
Neutrophils Relative %: 54 % (ref 43–77)
PLATELETS: 171 10*3/uL (ref 150–400)
RBC: 4.28 MIL/uL (ref 3.87–5.11)
RDW: 14.3 % (ref 11.5–15.5)
WBC: 5.4 10*3/uL (ref 4.0–10.5)

## 2013-12-05 LAB — COMPREHENSIVE METABOLIC PANEL
ALK PHOS: 85 U/L (ref 39–117)
ALT: 18 U/L (ref 0–35)
ANION GAP: 16 — AB (ref 5–15)
AST: 21 U/L (ref 0–37)
Albumin: 3.4 g/dL — ABNORMAL LOW (ref 3.5–5.2)
BILIRUBIN TOTAL: 0.4 mg/dL (ref 0.3–1.2)
BUN: 29 mg/dL — AB (ref 6–23)
CHLORIDE: 105 meq/L (ref 96–112)
CO2: 21 mEq/L (ref 19–32)
CREATININE: 1.46 mg/dL — AB (ref 0.50–1.10)
Calcium: 9.4 mg/dL (ref 8.4–10.5)
GFR, EST AFRICAN AMERICAN: 43 mL/min — AB (ref >90)
GFR, EST NON AFRICAN AMERICAN: 37 mL/min — AB (ref >90)
GLUCOSE: 143 mg/dL — AB (ref 70–99)
Potassium: 4.1 mEq/L (ref 3.7–5.3)
Sodium: 142 mEq/L (ref 137–147)
Total Protein: 7.1 g/dL (ref 6.0–8.3)

## 2013-12-05 LAB — URINE MICROSCOPIC-ADD ON

## 2013-12-05 LAB — URINALYSIS, ROUTINE W REFLEX MICROSCOPIC
Bilirubin Urine: NEGATIVE
Glucose, UA: NEGATIVE mg/dL
HGB URINE DIPSTICK: NEGATIVE
KETONES UR: NEGATIVE mg/dL
Nitrite: NEGATIVE
Specific Gravity, Urine: >1.03 — ABNORMAL HIGH (ref 1.005–1.030)
UROBILINOGEN UA: 1 mg/dL (ref 0.0–1.0)
pH: 5.5 (ref 5.0–8.0)

## 2013-12-05 LAB — TSH: TSH: 6.82 u[IU]/mL — ABNORMAL HIGH (ref 0.350–4.500)

## 2013-12-05 LAB — LACTATE DEHYDROGENASE: LDH: 175 U/L (ref 94–250)

## 2013-12-05 NOTE — Progress Notes (Signed)
LABS FOR TSH,LDH,CMP,CBCD,UA

## 2013-12-05 NOTE — Patient Instructions (Signed)
[facility] Discharge Instructions  RECOMMENDATIONS MADE BY THE CONSULTANT AND ANY TEST RESULTS WILL BE SENT TO YOUR REFERRING PHYSICIAN.  Continue Votrient daily (600 mg). We will repeat labs in 4 weeks and see you back in 4 weeks.  Follow-up with [facility] as directed in [place] 2016.  Thank you for choosing [facility] to provide your oncology and hematology care.  To afford each patient quality time with our providers, please arrive at least 15 minutes before your scheduled appointment time.  With your help, our goal is to use those 15 minutes to complete the necessary work-up to ensure our physicians have the information they need to help with your evaluation and healthcare recommendations.    Effective January 1st, 2014, we ask that you re-schedule your appointment with our physicians should you arrive 10 or more minutes late for your appointment.  We strive to give you quality time with our providers, and arriving late affects you and other patients whose appointments are after yours.    Again, thank you for choosing [facility].  Our hope is that these requests will decrease the amount of time that you wait before being seen by our physicians.       _____________________________________________________________  Should you have questions after your visit to [facility], please contact our office at [phone] between the hours of 8:30 a.m. and 5:00 p.m.  Voicemails left after 4:30 p.m. will not be returned until the following business day.  For prescription refill requests, have your pharmacy contact our office with your prescription refill request.

## 2013-12-06 ENCOUNTER — Encounter (HOSPITAL_COMMUNITY): Payer: Self-pay

## 2013-12-06 ENCOUNTER — Ambulatory Visit (HOSPITAL_COMMUNITY)
Admission: RE | Admit: 2013-12-06 | Discharge: 2013-12-06 | Disposition: A | Discharge status: Home / Self Care | Payer: Worker's Compensation | Source: Ambulatory Visit | Attending: Physician Assistant | Admitting: Physician Assistant

## 2013-12-06 DIAGNOSIS — M25611 Stiffness of right shoulder, not elsewhere classified: Secondary | ICD-10-CM

## 2013-12-06 DIAGNOSIS — M25511 Pain in right shoulder: Secondary | ICD-10-CM

## 2013-12-06 DIAGNOSIS — M6281 Muscle weakness (generalized): Secondary | ICD-10-CM

## 2013-12-06 DIAGNOSIS — R262 Difficulty in walking, not elsewhere classified: Secondary | ICD-10-CM | POA: Diagnosis not present
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
## 2013-12-12 ENCOUNTER — Ambulatory Visit (HOSPITAL_COMMUNITY)
Admission: RE | Admit: 2013-12-12 | Discharge: 2013-12-12 | Disposition: A | Discharge status: Home / Self Care | Payer: Worker's Compensation | Source: Ambulatory Visit | Attending: Hematology and Oncology | Admitting: Hematology and Oncology

## 2013-12-12 ENCOUNTER — Encounter (HOSPITAL_COMMUNITY): Payer: Self-pay

## 2013-12-12 DIAGNOSIS — M25611 Stiffness of right shoulder, not elsewhere classified: Secondary | ICD-10-CM

## 2013-12-12 DIAGNOSIS — R262 Difficulty in walking, not elsewhere classified: Secondary | ICD-10-CM | POA: Insufficient documentation

## 2013-12-12 DIAGNOSIS — M6281 Muscle weakness (generalized): Secondary | ICD-10-CM

## 2013-12-12 DIAGNOSIS — M25511 Pain in right shoulder: Secondary | ICD-10-CM
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
## 2013-12-14 ENCOUNTER — Encounter (HOSPITAL_COMMUNITY): Payer: Self-pay

## 2013-12-14 ENCOUNTER — Ambulatory Visit (HOSPITAL_COMMUNITY)
Admission: RE | Admit: 2013-12-14 | Discharge: 2013-12-14 | Disposition: A | Discharge status: Home / Self Care | Payer: Worker's Compensation | Source: Ambulatory Visit | Attending: Physician Assistant | Admitting: Physician Assistant

## 2013-12-14 DIAGNOSIS — M25511 Pain in right shoulder: Secondary | ICD-10-CM

## 2013-12-14 DIAGNOSIS — M25611 Stiffness of right shoulder, not elsewhere classified: Secondary | ICD-10-CM

## 2013-12-14 DIAGNOSIS — M6281 Muscle weakness (generalized): Secondary | ICD-10-CM

## 2013-12-14 DIAGNOSIS — R262 Difficulty in walking, not elsewhere classified: Secondary | ICD-10-CM | POA: Diagnosis not present
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
## 2013-12-19 ENCOUNTER — Ambulatory Visit (HOSPITAL_COMMUNITY): Payer: BC Managed Care – PPO

## 2013-12-19 ENCOUNTER — Telehealth (HOSPITAL_COMMUNITY): Payer: Self-pay | Admitting: *Deleted

## 2013-12-21 ENCOUNTER — Encounter (HOSPITAL_COMMUNITY): Payer: Self-pay

## 2013-12-21 ENCOUNTER — Ambulatory Visit (HOSPITAL_COMMUNITY)
Admission: RE | Admit: 2013-12-21 | Discharge: 2013-12-21 | Disposition: A | Discharge status: Home / Self Care | Payer: Medicare Other | Source: Ambulatory Visit | Attending: Physician Assistant | Admitting: Physician Assistant

## 2013-12-21 DIAGNOSIS — M6281 Muscle weakness (generalized): Secondary | ICD-10-CM | POA: Diagnosis not present

## 2013-12-21 DIAGNOSIS — M25511 Pain in right shoulder: Secondary | ICD-10-CM

## 2013-12-21 DIAGNOSIS — M25611 Stiffness of right shoulder, not elsewhere classified: Secondary | ICD-10-CM

## 2013-12-21 DIAGNOSIS — Z5189 Encounter for other specified aftercare: Secondary | ICD-10-CM | POA: Diagnosis present
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
## 2013-12-22 ENCOUNTER — Ambulatory Visit (HOSPITAL_COMMUNITY): Payer: Self-pay | Admitting: Psychiatry

## 2013-12-25 ENCOUNTER — Telehealth (HOSPITAL_COMMUNITY): Payer: Self-pay

## 2013-12-25 ENCOUNTER — Encounter (HOSPITAL_COMMUNITY): Payer: Self-pay | Admitting: Psychiatry

## 2013-12-25 ENCOUNTER — Ambulatory Visit (INDEPENDENT_AMBULATORY_CARE_PROVIDER_SITE_OTHER): Payer: No Typology Code available for payment source | Admitting: Psychiatry

## 2013-12-25 ENCOUNTER — Other Ambulatory Visit: Payer: Self-pay | Admitting: Physician Assistant

## 2013-12-25 VITALS — BP 153/93 | HR 56 | Ht 65.0 in | Wt 180.6 lb

## 2013-12-25 DIAGNOSIS — F32A Depression, unspecified: Secondary | ICD-10-CM

## 2013-12-25 DIAGNOSIS — N2889 Other specified disorders of kidney and ureter: Secondary | ICD-10-CM

## 2013-12-25 DIAGNOSIS — F329 Major depressive disorder, single episode, unspecified: Secondary | ICD-10-CM

## 2013-12-25 MED ORDER — METHYLPHENIDATE HCL 20 MG PO TABS: 20.0000 mg | ORAL_TABLET | Freq: Two times a day (BID) | ORAL | Status: DC

## 2013-12-25 MED ORDER — PAROXETINE HCL 20 MG PO TABS: 20.0000 mg | ORAL_TABLET | Freq: Two times a day (BID) | ORAL | Status: DC
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
## 2013-12-25 NOTE — Telephone Encounter (Signed)
-----   Message from Baird Cancer, PA-C sent at 12/25/2013  5:02 PM EST ----- Please ask patient to hold Compazine.  She doesn't take the medication often, unless she is getting it from someone other than me because I only gave her #60 in September.  Dr. Harrington Challenger (psych) is concerned about tardive dyskinesia from this medication.  ----- Message -----    From: Levonne Spiller, MD    Sent: 12/25/2013   3:49 PM      To: Baird Cancer, PA-C  Hello! [name] [name] sees me in Psychiatry for depression. She has developed tardive dyskinesia (constant chewing motion and tongue tremor) which I think is secondary to compazine. Could you please consider something else for her nausea?  Thanks, Levonne Spiller MD

## 2013-12-25 NOTE — Telephone Encounter (Signed)
Jini notified, verbalized understanding of instruction and she will go back to using the zofran for nausea if needed.

## 2013-12-26 ENCOUNTER — Encounter (HOSPITAL_COMMUNITY): Payer: Self-pay

## 2013-12-26 ENCOUNTER — Ambulatory Visit (HOSPITAL_COMMUNITY)
Admission: RE | Admit: 2013-12-26 | Discharge: 2013-12-26 | Disposition: A | Discharge status: Home / Self Care | Payer: Medicare Other | Source: Ambulatory Visit | Attending: Physician Assistant | Admitting: Physician Assistant

## 2013-12-26 ENCOUNTER — Other Ambulatory Visit (HOSPITAL_COMMUNITY): Payer: Self-pay | Admitting: Oncology

## 2013-12-26 DIAGNOSIS — M6281 Muscle weakness (generalized): Secondary | ICD-10-CM

## 2013-12-26 DIAGNOSIS — M25511 Pain in right shoulder: Secondary | ICD-10-CM

## 2013-12-26 DIAGNOSIS — M25611 Stiffness of right shoulder, not elsewhere classified: Secondary | ICD-10-CM

## 2013-12-26 DIAGNOSIS — Z5189 Encounter for other specified aftercare: Secondary | ICD-10-CM | POA: Diagnosis not present
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
## 2013-12-26 NOTE — Telephone Encounter (Signed)
Medication refilled per protocol. 

## 2013-12-28 ENCOUNTER — Other Ambulatory Visit (HOSPITAL_COMMUNITY): Payer: Self-pay

## 2013-12-28 ENCOUNTER — Encounter (HOSPITAL_COMMUNITY): Payer: Self-pay

## 2013-12-28 ENCOUNTER — Ambulatory Visit (HOSPITAL_COMMUNITY)
Admission: RE | Admit: 2013-12-28 | Discharge: 2013-12-28 | Disposition: A | Discharge status: Home / Self Care | Payer: Medicare Other | Source: Ambulatory Visit | Attending: Physician Assistant | Admitting: Physician Assistant

## 2013-12-28 DIAGNOSIS — M25611 Stiffness of right shoulder, not elsewhere classified: Secondary | ICD-10-CM

## 2013-12-28 DIAGNOSIS — M25511 Pain in right shoulder: Secondary | ICD-10-CM

## 2013-12-28 DIAGNOSIS — Z5189 Encounter for other specified aftercare: Secondary | ICD-10-CM | POA: Diagnosis not present

## 2013-12-28 DIAGNOSIS — M6281 Muscle weakness (generalized): Secondary | ICD-10-CM
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
## 2013-12-29 ENCOUNTER — Other Ambulatory Visit (HOSPITAL_COMMUNITY): Payer: Self-pay

## 2013-12-29 DIAGNOSIS — C649 Malignant neoplasm of unspecified kidney, except renal pelvis: Secondary | ICD-10-CM

## 2013-12-30 DIAGNOSIS — G2401 Drug induced subacute dyskinesia: Secondary | ICD-10-CM | POA: Insufficient documentation

## 2013-12-30 NOTE — Assessment & Plan Note (Addendum)
On Votrient 600 mg daily (decreased from 800 mg daily).  Tolerating medication reasonably well with positive response to therapy thus far.  Main treatment facility if [facility] and urology department.  Compliance encouraged.  I personally reviewed and went over laboratory results with the patient.  The results are noted within this dictation.  She is to follow-up at [facility] as directed in Jan 2016.   She will return in 2 months for follow-up. Labs today as planned.

## 2013-12-30 NOTE — Progress Notes (Signed)
-  Rescheduled-  [name] [name]  

## 2013-12-30 NOTE — Assessment & Plan Note (Signed)
Secondary to Compazine.  Medication discontinued.  She is to avoid Compazine, Phenergan, and Haldol as well.  These medications are added to allergy list as intolerances.

## 2014-01-01 ENCOUNTER — Other Ambulatory Visit (HOSPITAL_COMMUNITY): Payer: Self-pay

## 2014-01-01 ENCOUNTER — Ambulatory Visit (HOSPITAL_COMMUNITY): Payer: Self-pay | Admitting: Oncology

## 2014-01-01 ENCOUNTER — Ambulatory Visit (HOSPITAL_COMMUNITY): Payer: Medicare Other | Admitting: Specialist

## 2014-01-02 ENCOUNTER — Other Ambulatory Visit (HOSPITAL_COMMUNITY): Payer: Self-pay

## 2014-01-02 ENCOUNTER — Ambulatory Visit (HOSPITAL_COMMUNITY): Payer: Self-pay | Admitting: Hematology & Oncology

## 2014-01-03 ENCOUNTER — Ambulatory Visit (HOSPITAL_COMMUNITY)
Admission: RE | Admit: 2014-01-03 | Discharge: 2014-01-03 | Disposition: A | Discharge status: Home / Self Care | Payer: Medicare Other | Source: Ambulatory Visit | Attending: Physician Assistant | Admitting: Physician Assistant

## 2014-01-03 ENCOUNTER — Encounter (HOSPITAL_COMMUNITY): Payer: Self-pay

## 2014-01-03 DIAGNOSIS — Z5189 Encounter for other specified aftercare: Secondary | ICD-10-CM | POA: Diagnosis not present

## 2014-01-03 DIAGNOSIS — M25511 Pain in right shoulder: Secondary | ICD-10-CM

## 2014-01-03 DIAGNOSIS — M6281 Muscle weakness (generalized): Secondary | ICD-10-CM

## 2014-01-03 DIAGNOSIS — M25611 Stiffness of right shoulder, not elsewhere classified: Secondary | ICD-10-CM
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
## 2014-01-08 NOTE — Assessment & Plan Note (Addendum)
On Votrient 600 mg daily (decreased from 800 mg daily). Tolerating medication reasonably well with positive response to therapy thus far. Main treatment facility if [facility] and urology department. Compliance encouraged. I personally reviewed and went over laboratory results with the patient. The results are noted within this dictation. She is to follow-up at [facility] as directed in Jan 2016. She will return in 6 weeks for follow-up.

## 2014-01-08 NOTE — Assessment & Plan Note (Signed)
Secondary to Compazine. Medication discontinued. She is to avoid Compazine, Phenergan, and Haldol as well. These medications are added to allergy list as intolerances.
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
## 2014-01-10 ENCOUNTER — Encounter (HOSPITAL_BASED_OUTPATIENT_CLINIC_OR_DEPARTMENT_OTHER): Payer: Medicare Other

## 2014-01-10 ENCOUNTER — Encounter (HOSPITAL_COMMUNITY): Payer: Medicare Other | Attending: Hematology and Oncology | Admitting: Oncology

## 2014-01-10 ENCOUNTER — Ambulatory Visit (HOSPITAL_COMMUNITY): Payer: Medicare Other

## 2014-01-10 ENCOUNTER — Encounter (HOSPITAL_COMMUNITY): Payer: Worker's Compensation

## 2014-01-10 VITALS — BP 133/67 | HR 74 | Temp 97.7°F | Resp 22 | Wt 176.2 lb

## 2014-01-10 DIAGNOSIS — K219 Gastro-esophageal reflux disease without esophagitis: Secondary | ICD-10-CM

## 2014-01-10 DIAGNOSIS — R112 Nausea with vomiting, unspecified: Secondary | ICD-10-CM

## 2014-01-10 DIAGNOSIS — C7800 Secondary malignant neoplasm of unspecified lung: Secondary | ICD-10-CM | POA: Diagnosis not present

## 2014-01-10 DIAGNOSIS — F329 Major depressive disorder, single episode, unspecified: Secondary | ICD-10-CM

## 2014-01-10 DIAGNOSIS — N390 Urinary tract infection, site not specified: Secondary | ICD-10-CM | POA: Diagnosis not present

## 2014-01-10 DIAGNOSIS — R5383 Other fatigue: Secondary | ICD-10-CM

## 2014-01-10 DIAGNOSIS — G2401 Drug induced subacute dyskinesia: Secondary | ICD-10-CM | POA: Diagnosis not present

## 2014-01-10 DIAGNOSIS — C649 Malignant neoplasm of unspecified kidney, except renal pelvis: Secondary | ICD-10-CM

## 2014-01-10 DIAGNOSIS — R918 Other nonspecific abnormal finding of lung field: Secondary | ICD-10-CM | POA: Diagnosis present

## 2014-01-10 DIAGNOSIS — R1110 Vomiting, unspecified: Secondary | ICD-10-CM

## 2014-01-10 DIAGNOSIS — F32A Depression, unspecified: Secondary | ICD-10-CM

## 2014-01-10 DIAGNOSIS — R82998 Other abnormal findings in urine: Secondary | ICD-10-CM

## 2014-01-10 LAB — CBC WITH DIFFERENTIAL/PLATELET
BASOS ABS: 0 10*3/uL (ref 0.0–0.1)
BASOS PCT: 0 % (ref 0–1)
EOS PCT: 5 % (ref 0–5)
Eosinophils Absolute: 0.4 10*3/uL (ref 0.0–0.7)
HCT: 43.4 % (ref 36.0–46.0)
Hemoglobin: 14.6 g/dL (ref 12.0–15.0)
LYMPHS ABS: 1.9 10*3/uL (ref 0.7–4.0)
Lymphocytes Relative: 23 % (ref 12–46)
MCH: 33.5 pg (ref 26.0–34.0)
MCHC: 33.6 g/dL (ref 30.0–36.0)
MCV: 99.5 fL (ref 78.0–100.0)
Monocytes Absolute: 0.2 10*3/uL (ref 0.1–1.0)
Monocytes Relative: 3 % (ref 3–12)
NEUTROS PCT: 69 % (ref 43–77)
Neutro Abs: 5.6 10*3/uL (ref 1.7–7.7)
PLATELETS: 241 10*3/uL (ref 150–400)
RBC: 4.36 MIL/uL (ref 3.87–5.11)
RDW: 15.3 % (ref 11.5–15.5)
WBC: 8.1 10*3/uL (ref 4.0–10.5)

## 2014-01-10 LAB — URINALYSIS, ROUTINE W REFLEX MICROSCOPIC
BILIRUBIN URINE: NEGATIVE
GLUCOSE, UA: NEGATIVE mg/dL
HGB URINE DIPSTICK: NEGATIVE
Nitrite: NEGATIVE
Specific Gravity, Urine: 1.025 (ref 1.005–1.030)
UROBILINOGEN UA: 1 mg/dL (ref 0.0–1.0)
pH: 5.5 (ref 5.0–8.0)

## 2014-01-10 LAB — COMPREHENSIVE METABOLIC PANEL
ALBUMIN: 3.5 g/dL (ref 3.5–5.2)
ALK PHOS: 64 U/L (ref 39–117)
ALT: 16 U/L (ref 0–35)
AST: 21 U/L (ref 0–37)
Anion gap: 9 (ref 5–15)
BILIRUBIN TOTAL: 0.6 mg/dL (ref 0.3–1.2)
BUN: 18 mg/dL (ref 6–23)
CALCIUM: 9.5 mg/dL (ref 8.4–10.5)
CHLORIDE: 109 meq/L (ref 96–112)
CO2: 22 mmol/L (ref 19–32)
CREATININE: 1.11 mg/dL — AB (ref 0.50–1.10)
GFR calc Af Amer: 59 mL/min — ABNORMAL LOW (ref >90)
GFR calc non Af Amer: 51 mL/min — ABNORMAL LOW (ref >90)
Glucose, Bld: 104 mg/dL — ABNORMAL HIGH (ref 70–99)
Potassium: 4.4 mmol/L (ref 3.5–5.1)
Sodium: 140 mmol/L (ref 135–145)
Total Protein: 6.6 g/dL (ref 6.0–8.3)

## 2014-01-10 LAB — TSH: TSH: 2.203 u[IU]/mL (ref 0.350–4.500)

## 2014-01-10 LAB — URINE MICROSCOPIC-ADD ON

## 2014-01-10 LAB — LACTATE DEHYDROGENASE: LDH: 97 U/L (ref 94–250)

## 2014-01-10 MED ORDER — SODIUM CHLORIDE 0.9 % IV SOLN: 8.0000 mg | Freq: Once | INTRAVENOUS | Status: DC

## 2014-01-10 MED ORDER — OXYCODONE HCL 5 MG PO TABS: 5.0000 mg | ORAL_TABLET | Freq: Four times a day (QID) | ORAL | Status: DC | PRN

## 2014-01-10 MED ORDER — LORAZEPAM 0.5 MG PO TABS: 0.5000 mg | ORAL_TABLET | ORAL | Status: DC | PRN

## 2014-01-10 MED ORDER — ONDANSETRON HCL 8 MG PO TABS: 8.0000 mg | ORAL_TABLET | Freq: Three times a day (TID) | ORAL | Status: DC | PRN

## 2014-01-10 MED ORDER — LORAZEPAM 2 MG/ML IJ SOLN
0.5000 mg | Freq: Once | INTRAMUSCULAR | Status: AC
  Administered 2014-01-10: 0.5 mg via INTRAVENOUS
  Filled 2014-01-10: qty 1

## 2014-01-10 MED ORDER — DEXAMETHASONE SODIUM PHOSPHATE 10 MG/ML IJ SOLN: 10.0000 mg | Freq: Once | INTRAMUSCULAR | Status: DC

## 2014-01-10 MED ORDER — SULFAMETHOXAZOLE-TRIMETHOPRIM 800-160 MG PO TABS: 1.0000 | ORAL_TABLET | Freq: Two times a day (BID) | ORAL | Status: AC

## 2014-01-10 MED ORDER — SODIUM CHLORIDE 0.9 % IV SOLN
INTRAVENOUS | Status: DC
  Administered 2014-01-10: 12:00:00 via INTRAVENOUS

## 2014-01-10 MED ORDER — LORAZEPAM 0.5 MG PO TABS: 0.5000 mg | ORAL_TABLET | Freq: Three times a day (TID) | ORAL | Status: DC

## 2014-01-10 MED ORDER — SODIUM CHLORIDE 0.9 % IV SOLN
Freq: Once | INTRAVENOUS | Status: AC
  Administered 2014-01-10: 8 mg via INTRAVENOUS
  Filled 2014-01-10: qty 4

## 2014-01-10 NOTE — Progress Notes (Signed)
Lab draw

## 2014-01-10 NOTE — Patient Instructions (Addendum)
[facility] Discharge Instructions  RECOMMENDATIONS MADE BY THE CONSULTANT AND ANY TEST RESULTS WILL BE SENT TO YOUR REFERRING PHYSICIAN.  Today, I provided two prescriptions: 1. Ativan (Lorazepam).  This medication can be taken every 4-6 hours for nausea/vomiting.  You can take this medication sublingually if needed.  This will hopefully help with your anxiety as well.  2. Oxycodone 5-10 mg every 4-6 hours as needed for pain.  Follow-up at [facility] as planned.  Thank you for choosing [facility] to provide your oncology and hematology care.  To afford each patient quality time with our providers, please arrive at least 15 minutes before your scheduled appointment time.  With your help, our goal is to use those 15 minutes to complete the necessary work-up to ensure our physicians have the information they need to help with your evaluation and healthcare recommendations.    Effective January 1st, 2014, we ask that you re-schedule your appointment with our physicians should you arrive 10 or more minutes late for your appointment.  We strive to give you quality time with our providers, and arriving late affects you and other patients whose appointments are after yours.    Again, thank you for choosing [facility].  Our hope is that these requests will decrease the amount of time that you wait before being seen by our physicians.       _____________________________________________________________  Should you have questions after your visit to [facility], please contact our office at [phone] between the hours of 8:30 a.m. and 5:00 p.m.  Voicemails left after 4:30 p.m. will not be returned until the following business day.  For prescription refill requests, have your pharmacy contact our office with your prescription refill request.

## 2014-01-10 NOTE — Patient Instructions (Signed)
[facility] Discharge Instructions  RECOMMENDATIONS MADE BY THE CONSULTANT AND ANY TEST RESULTS WILL BE SENT TO YOUR REFERRING PHYSICIAN.  Today your were given IV fluids and medications for nausea. Follow up as scheduled.  Thank you for choosing [facility] to provide your oncology and hematology care.  To afford each patient quality time with our providers, please arrive at least 15 minutes before your scheduled appointment time.  With your help, our goal is to use those 15 minutes to complete the necessary work-up to ensure our physicians have the information they need to help with your evaluation and healthcare recommendations.    Effective January 1st, 2014, we ask that you re-schedule your appointment with our physicians should you arrive 10 or more minutes late for your appointment.  We strive to give you quality time with our providers, and arriving late affects you and other patients whose appointments are after yours.    Again, thank you for choosing [facility].  Our hope is that these requests will decrease the amount of time that you wait before being seen by our physicians.       _____________________________________________________________  Should you have questions after your visit to [facility], please contact our office at [phone] between the hours of 8:30 a.m. and 4:30 p.m.  Voicemails left after 4:30 p.m. will not be returned until the following business day.  For prescription refill requests, have your pharmacy contact our office with your prescription refill request.    _______________________________________________________________  We hope that we have given you very good care.  You may receive a patient satisfaction survey in the mail, please complete it and return it as soon as possible.  We value your feedback!  _______________________________________________________________  Have you asked about  our STAR program?  STAR stands for Survivorship Training and Rehabilitation, and this is a nationally recognized cancer care program that focuses on survivorship and rehabilitation.  Cancer and cancer treatments may cause problems, such as, pain, making you feel tired and keeping you from doing the things that you need or want to do. Cancer rehabilitation can help. Our goal is to reduce these troubling effects and help you have the best quality of life possible.  You may receive a survey from a nurse that asks questions about your current state of health.  Based on the survey results, all eligible patients will be referred to the [facility] program for an evaluation so we can better serve you!  A frequently asked questions sheet is available upon request.

## 2014-01-10 NOTE — Assessment & Plan Note (Signed)
Will give 1 L normal saline today with anti-emetics including 10 mg of dexamethasone, 8 mg of Zofran, and 0.5 mg of Ativan (all medications intravenous). I've revamped her antibiotic regimen at home by increasing frequency of Zofran to 8 mg every 8 hours, and added Ativan by mouth or sublingually 0.5 mg every 4-6 hours as needed for nausea and/or vomiting. This will hopefully help with her anxiety as well. She will update [place] via telephone

## 2014-01-10 NOTE — Progress Notes (Signed)
Patient stated she is feeling much better after meds. Pt tolerated ivf's well.

## 2014-01-10 NOTE — Assessment & Plan Note (Signed)
Followed by Dr. Harrington Challenger, psych.

## 2014-01-10 NOTE — Assessment & Plan Note (Signed)
Asymptomatic, the given situation, we'll treat with Bactrim 7 days

## 2014-01-16 ENCOUNTER — Encounter (HOSPITAL_COMMUNITY): Payer: Self-pay

## 2014-01-16 ENCOUNTER — Ambulatory Visit (HOSPITAL_COMMUNITY)
Admission: RE | Admit: 2014-01-16 | Discharge: 2014-01-16 | Disposition: A | Discharge status: Home / Self Care | Payer: PPO | Source: Ambulatory Visit | Attending: Physician Assistant | Admitting: Physician Assistant

## 2014-01-16 DIAGNOSIS — Z5189 Encounter for other specified aftercare: Secondary | ICD-10-CM | POA: Insufficient documentation

## 2014-01-16 DIAGNOSIS — M25611 Stiffness of right shoulder, not elsewhere classified: Secondary | ICD-10-CM | POA: Diagnosis not present

## 2014-01-16 DIAGNOSIS — M25511 Pain in right shoulder: Secondary | ICD-10-CM | POA: Insufficient documentation

## 2014-01-16 DIAGNOSIS — M6281 Muscle weakness (generalized): Secondary | ICD-10-CM | POA: Insufficient documentation

## 2014-01-16 NOTE — Patient Instructions (Signed)
ROM: Abduction (Standing)   Bring arms straight out from sides and raise as high as possible without pain. Repeat __10__ times per set. Do __1__ sets per session. Do __2__ sessions per day.   Extension (Active) ROM: Extension (Standing)   Bring arms straight back as far as possible without pain. Repeat _10___ times per set. Do _1___ sets per session. Do _2___ sessions per day.     ROM: External / Internal Rotation - in Abduction (Standing)   With upper arms parallel to floor and elbows bent at right angles, gently rotate arms up then down as far as possible without pain. Repeat __10__ times per set. Do __1__ sets per session. Do _2___ sessions per day.      Flexors Stretch (Active)   Stand, arms straight at sides. Bring arms straight forward and upward as high as possible without pain.  Repeat _10__ times per session. Do _2__ sessions per day.   Scapular Retraction (Standing)   With arms at sides, pinch shoulder blades together. Repeat 10____ times per set. Do __1_ sets per session. Do _2___ sessions per day.
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
## 2014-01-17 ENCOUNTER — Telehealth: Payer: Self-pay | Admitting: Nutrition

## 2014-01-17 ENCOUNTER — Ambulatory Visit (INDEPENDENT_AMBULATORY_CARE_PROVIDER_SITE_OTHER): Payer: PPO | Admitting: Psychiatry

## 2014-01-17 DIAGNOSIS — F418 Other specified anxiety disorders: Secondary | ICD-10-CM

## 2014-01-17 NOTE — Patient Instructions (Signed)
Discussed orally 

## 2014-01-17 NOTE — Progress Notes (Signed)
      THERAPIST PROGRESS NOTE  Session Time: Wednesday 01/17/2014 2:00 PM - 2:50 PM  Participation Level: Active  Behavioral Response: CasualAlert/ anxious, depressed  Type of Therapy: Individual Therapy  Treatment Goals addressed: Improve ability to manage stress and transitions with decreased irritability and feelings of being overwhelmed  Interventions: CBT and Supportive  Summary: [name] [name] is a 64 y.o. female who presents with symptoms of depression and anxiety that have been intermittent for several years that have been well controlled until patient was diagnosed with cancer in 2013. Symptoms have worsened in recent months as patient reports constantly thinking about her diagnoses and fears cancer will spread. She has been given a prognosis of 2-4 years to live. Patient reports taking chemotherapy pills and reports extreme fatigue. Patient also experiences depression, anxiety, and crying spells  Patient reports increased stress, anxiety, depressed mood, and feelings of doom for the past few weeks. Patient reports being worried due to uncontrollable chewing resulting in sores and pain in her mouth. Her oncologist has discontinued the Compazine which may have caused this side effect per patient's report. Patient reports she began to have negative spiraling thoughts including this was the end regarding her life and fear her husband would die before she dies. She also reports stress related to financial issues. She continues to express frustration regarding her reduced physical functioning and expresses guilt about being unable to help her family as she has in the past.  Suicidal/Homicidal: No  Therapist Response: Therapist works with patient to process feelings, identify ways to intervene in negative spiraling thought patterns including coping statements, discussing acceptance, review relaxation techniques  Plan: Return again in 4 weeks.  Diagnosis: Axis I: Depressive Disorder  NOS    Axis II: Deferred    BYNUM,PEGGY, LCSW 01/17/2014

## 2014-01-17 NOTE — Telephone Encounter (Signed)
Patient identified to be at risk for malnutrition on the MST secondary to poor appetite and weight loss. Contacted patient by phone.  However, she was unavailable. Left message and contact information for patient to return my call.

## 2014-01-18 ENCOUNTER — Encounter (HOSPITAL_COMMUNITY): Payer: Self-pay

## 2014-01-18 ENCOUNTER — Ambulatory Visit (HOSPITAL_COMMUNITY)
Admission: RE | Admit: 2014-01-18 | Discharge: 2014-01-18 | Disposition: A | Discharge status: Home / Self Care | Payer: PPO | Source: Ambulatory Visit | Attending: Physician Assistant | Admitting: Physician Assistant

## 2014-01-18 DIAGNOSIS — Z5189 Encounter for other specified aftercare: Secondary | ICD-10-CM | POA: Diagnosis not present

## 2014-01-18 DIAGNOSIS — M25511 Pain in right shoulder: Secondary | ICD-10-CM

## 2014-01-18 DIAGNOSIS — M25611 Stiffness of right shoulder, not elsewhere classified: Secondary | ICD-10-CM

## 2014-01-18 DIAGNOSIS — M6281 Muscle weakness (generalized): Secondary | ICD-10-CM
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
## 2014-01-23 ENCOUNTER — Encounter (HOSPITAL_COMMUNITY): Payer: Self-pay

## 2014-01-23 ENCOUNTER — Other Ambulatory Visit: Payer: Self-pay

## 2014-01-23 ENCOUNTER — Ambulatory Visit (HOSPITAL_COMMUNITY)
Admission: RE | Admit: 2014-01-23 | Discharge: 2014-01-23 | Disposition: A | Discharge status: Home / Self Care | Payer: PPO | Source: Ambulatory Visit | Attending: Physician Assistant | Admitting: Physician Assistant

## 2014-01-23 DIAGNOSIS — Z5189 Encounter for other specified aftercare: Secondary | ICD-10-CM | POA: Diagnosis not present

## 2014-01-23 DIAGNOSIS — M25511 Pain in right shoulder: Secondary | ICD-10-CM

## 2014-01-23 DIAGNOSIS — Z1231 Encounter for screening mammogram for malignant neoplasm of breast: Secondary | ICD-10-CM

## 2014-01-23 DIAGNOSIS — M6281 Muscle weakness (generalized): Secondary | ICD-10-CM

## 2014-01-23 DIAGNOSIS — M25611 Stiffness of right shoulder, not elsewhere classified: Secondary | ICD-10-CM
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
## 2014-01-25 ENCOUNTER — Encounter (HOSPITAL_COMMUNITY): Payer: Self-pay

## 2014-01-25 ENCOUNTER — Other Ambulatory Visit (HOSPITAL_COMMUNITY): Payer: Self-pay | Admitting: Oncology

## 2014-01-25 DIAGNOSIS — C649 Malignant neoplasm of unspecified kidney, except renal pelvis: Secondary | ICD-10-CM

## 2014-01-26 ENCOUNTER — Encounter (HOSPITAL_COMMUNITY): Payer: Self-pay

## 2014-01-26 ENCOUNTER — Ambulatory Visit (HOSPITAL_COMMUNITY)
Admission: RE | Admit: 2014-01-26 | Discharge: 2014-01-26 | Disposition: A | Discharge status: Home / Self Care | Payer: PPO | Source: Ambulatory Visit | Attending: Orthopedic Surgery | Admitting: Orthopedic Surgery

## 2014-01-26 DIAGNOSIS — M25511 Pain in right shoulder: Secondary | ICD-10-CM

## 2014-01-26 DIAGNOSIS — M25611 Stiffness of right shoulder, not elsewhere classified: Secondary | ICD-10-CM

## 2014-01-26 DIAGNOSIS — M6281 Muscle weakness (generalized): Secondary | ICD-10-CM

## 2014-01-26 DIAGNOSIS — Z5189 Encounter for other specified aftercare: Secondary | ICD-10-CM | POA: Diagnosis not present
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
## 2014-01-27 NOTE — Assessment & Plan Note (Signed)
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 [name] after being on [place] since May 2014.  Telephone conversation with Dr. [name] regarding future treatment options reviewed.  Dr. [name] recommend Nivolumab which we can administer to the patient locally.  Patient education provided today regarding this regimen.  Future chemotherapy teching is needed.  Port-a-cath insertion is required.  Will start treatment in the near future after the completion of the aforementioned.  Pre-chemo therapy orders placed. Return in 2-3 weeks for follow-up.
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
## 2014-01-28 NOTE — Patient Instructions (Signed)
[place]   CHEMOTHERAPY INSTRUCTIONS  OPDIVO administration: You will receive this medication once every 2 weeks. It will take 1 hour for this medication to infuse.  You have been given an [facility] wallet card. Please keep this in your wallet. You have also been given an Opdivo Patient Monitoring Checklist. On this checklist, while reviewing, if anything becomes a YES - please call us!   OPDIVO (nivolumab) is a prescription medicine used to treat a type of advanced stage lung cancer (non-small cell lung cancer) that has spread or grown and you have tried chemotherapy that contains platinum, and it did not work or is no longer working.   The most common side effects of OPDIVO in people with non-small cell lung cancer include: feeling tired; pain in muscles, bones, and joints; decreased appetite; cough; constipation; shortness of breath; and nausea.  Tell us immediately if you get these symptoms during an infusion of OPDIVO: chills or shaking; itching or rash; flushing; difficulty breathing; dizziness; fever; and feeling like passing out.   Serious side effects may include:     Lung problems (pneumonitis). Symptoms of pneumonitis may include: new or worsening cough; chest pain; and shortness of breath.     Intestinal problems (colitis) that can lead to tears or holes in your intestine. Signs and symptoms of colitis may include: diarrhea (loose stools) or more bowel movements than usual; blood in your stools or dark, tarry, sticky stools; and severe stomach area (abdomen) pain or tenderness.     Liver problems (hepatitis). Signs and symptoms of hepatitis may include: yellowing of your skin or the whites of your eyes; severe nausea or vomiting; pain on the right side of your stomach area (abdomen); drowsiness; dark urine (tea colored); bleeding or bruising more easily than normal; and feeling less hungry than usual.     Hormone gland problems (especially the  thyroid, pituitary, and glands). Signs and symptoms that your hormone glands are not working properly may include: headaches that will not go away or unusual headaches; extreme tiredness, weight gain or weight loss; dizziness or fainting; changes in mood or behavior, such as decreased sex drive, irritability, or forgetfulness; hair loss; feeling cold; constipation; and voice gets deeper.     Kidney problems, including nephritis and kidney failure. Signs of kidney problems may include: decrease in the amount of urine; blood in your urine; swelling in your ankles; and loss of appetite.     Inflammation of the brain (encephalitis). Signs and symptoms of encephalitis may include: headache; fever; tiredness or weakness; confusion; memory problems; sleepiness; seeing or hearing things that are not really there (hallucinations); seizures; and stiff neck.     Problems in other organs. Signs of these problems may include: rash; changes in eyesight; severe or persistent muscle or joint pains; and severe muscle weakness.   Do not ignore any symptoms!!!!! We need to know if you are having any side effects or potential side effects of this treatment!   EDUCATIONAL MATERIALS GIVEN AND REVIEWED: Chemotherapy and You booklet and Specific Instructions Sheets: Opdivo   SELF CARE ACTIVITIES WHILE ON CHEMOTHERAPY: Increase your fluid intake 48 hours prior to treatment and drink at least 2 quarts per day after treatment., No alcohol intake., No aspirin or other medications unless approved by your oncologist., Eat foods that are light and easy to digest., Eat foods at cold or room temperature., No fried, fatty, or spicy foods immediately before or after treatment., Have teeth cleaned professionally before  starting treatment. Keep dentures and partial plates clean., Use soft toothbrush and do not use mouthwashes that contain alcohol. Biotene is a good mouthwash that is available at most pharmacies or may be ordered by  calling [phone]., Use warm salt water gargles (1 teaspoon salt per 1 quart warm water) before and after meals and at bedtime. Or you may rinse with 2 tablespoons of three -percent hydrogen peroxide mixed in eight ounces of water., Always use sunscreen with SPF (Sun Protection Factor) of 30 or higher., Use your nausea medication as directed to prevent nausea., Use your stool softener or laxative as directed to prevent constipation. and Use your anti-diarrheal medication as directed to stop diarrhea.  Please wash your hands for at least 30 seconds using warm soapy water. Handwashing is the #1 way to prevent the spread of germs. Stay away from sick people or people who are getting over a cold. If you develop respiratory systems such as green/yellow mucus production or productive cough or persistent cough let us know and we will see if you need an antibiotic. It is a good idea to keep a pair of gloves on when going into grocery stores/[employer] to decrease your risk of coming into contact with germs on the carts, etc. Carry alcohol hand gel with you at all times and use it frequently if out in public. All foods need to be cooked thoroughly. No raw foods. No medium or undercooked meats, eggs. If your food is cooked medium well, it does not need to be hot pink or saturated with bloody liquid at all. Vegetables and fruits need to be washed/rinsed under the faucet with a dish detergent before being consumed. You can eat raw fruits and vegetables unless we tell you otherwise but it would be best if you cooked them or bought frozen. Do not eat off of salad bars or hot bars unless you really trust the cleanliness of the restaurant. If you need dental work, please let Dr. Whitney Muse know before you go for your appointment so that we can coordinate the best possible time for you in regards to your chemo regimen. You need to also let your dentist know that you are actively taking chemo. We may need to do labs prior to your  dental appointment. We also want your bowels moving at least every other day. If this is not happening, we need to know so that we can get you on a bowel regimen to help you go.     MEDICATIONS: You have been given prescriptions for the following medications:   Over-the-Counter Meds:  Senna - this is a mild laxative used to treat mild constipation. May take 1-8 tabs by mouth everyday as needed for mild constipation.  Milk of Magnesia - this is a laxative used to treat moderate to severe constipation. May take 2-4 tablespoons every 8 hours as needed. May increase to 8 tablespoons x 1 dose and if no bowel movement call the [facility].     SYMPTOMS TO REPORT AS SOON AS POSSIBLE AFTER TREATMENT:  FEVER GREATER THAN 100.5 F  CHILLS WITH OR WITHOUT FEVER  NAUSEA AND VOMITING THAT IS NOT CONTROLLED WITH YOUR NAUSEA MEDICATION  UNUSUAL SHORTNESS OF BREATH  UNUSUAL BRUISING OR BLEEDING  TENDERNESS IN MOUTH AND THROAT WITH OR WITHOUT PRESENCE OF ULCERS  URINARY PROBLEMS  BOWEL PROBLEMS  UNUSUAL RASH    Wear comfortable clothing and clothing appropriate for easy access to any Portacath or PICC line. Let us know if there  is anything that we can do to make your therapy better!      I have been informed and understand all of the instructions given to me and have received a copy. I have been instructed to call the clinic [phone] or my family physician as soon as possible for continued medical care, if indicated. I do not have any more questions at this time but understand that I may call the [facility] or the Patient Navigator at [phone] during office hours should I have questions or need assistance in obtaining follow-up care.       Nivolumab injection What is this medicine? NIVOLUMAB (nye VOL ue mab) is used to treat certain types of melanoma and lung cancer. This medicine may be used for other purposes; ask your health care provider or pharmacist if you  have questions. COMMON BRAND NAME(S): Opdivo What should I tell my health care provider before I take this medicine? They need to know if you have any of these conditions: -eye disease, vision problems -history of pancreatitis -immune system problems -inflammatory bowel disease -kidney disease -liver disease -lung disease -lupus -myasthenia gravis -multiple sclerosis -organ transplant -stomach or intestine problems -thyroid disease -tingling of the fingers or toes, or other nerve disorder -an unusual or allergic reaction to nivolumab, other medicines, foods, dyes, or preservatives -pregnant or trying to get pregnant -breast-feeding How should I use this medicine? This medicine is for infusion into a vein. It is given by a health care professional in a hospital or clinic setting. A special MedGuide will be given to you before each treatment. Be sure to read this information carefully each time. Talk to your pediatrician regarding the use of this medicine in children. Special care may be needed. Overdosage: If you think you've taken too much of this medicine contact a poison control center or emergency room at once. Overdosage: If you think you have taken too much of this medicine contact a poison control center or emergency room at once. NOTE: This medicine is only for you. Do not share this medicine with others. What if I miss a dose? It is important not to miss your dose. Call your doctor or health care professional if you are unable to keep an appointment. What may interact with this medicine? Interactions have not been studied. This list may not describe all possible interactions. Give your health care provider a list of all the medicines, herbs, non-prescription drugs, or dietary supplements you use. Also tell them if you smoke, drink alcohol, or use illegal drugs. Some items may interact with your medicine. What should I watch for while using this medicine? Tell your doctor or  healthcare professional if your symptoms do not start to get better or if they get worse. Your condition will be monitored carefully while you are receiving this medicine. You may need blood work done while you are taking this medicine. What side effects may I notice from receiving this medicine? Side effects that you should report to your doctor or health care professional as soon as possible: -allergic reactions like skin rash, itching or hives, swelling of the face, lips, or tongue -black, tarry stools -bloody or watery diarrhea -changes in vision -chills -cough -depressed mood -eye pain -feeling anxious -fever -general ill feeling or flu-like symptoms -hair loss -loss of appetite -low blood counts - this medicine may decrease the number of white blood cells, red blood cells and platelets. You may be at increased risk for infections and bleeding -pain,  tingling, numbness in the hands or feet -redness, blistering, peeling or loosening of the skin, including inside the mouth -red pinpoint spots on skin -signs of decreased platelets or bleeding - bruising, pinpoint red spots on the skin, black, tarry stools, blood in the urine -signs of decreased red blood cells - unusually weak or tired, feeling faint or lightheaded, falls -signs of infection - fever or chills, cough, sore throat, pain or trouble passing urine -signs and symptoms of a dangerous change in heartbeat or heart rhythm like chest pain; dizziness; fast or irregular heartbeat; palpitations; feeling faint or lightheaded, falls; breathing problems -signs and symptoms of high blood sugar such as dizziness; dry mouth; dry skin; fruity breath; nausea; stomach pain; increased hunger or thirst; increased urination -signs and symptoms of kidney injury like trouble passing urine or change in the amount of urine -signs and symptoms of liver injury like dark yellow or brown urine; general ill feeling or flu-like symptoms; light-colored  stools; loss of appetite; nausea; right upper belly pain; unusually weak or tired; yellowing of the eyes or skin -signs and symptoms of increased potassium like muscle weakness; chest pain; or fast, irregular heartbeat -signs and symptoms of low potassium like muscle cramps or muscle pain; chest pain; dizziness; feeling faint or lightheaded, falls; palpitations; breathing problems; or fast, irregular heartbeat -swelling of the ankles, feet, hands -weight gainSide effects that usually do not require medical attention (report to your doctor or health care professional if they continue or are bothersome): -constipation -general ill feeling or flu-like symptoms -hair loss -loss of appetite -nausea, vomiting This list may not describe all possible side effects. Call your doctor for medical advice about side effects. You may report side effects to FDA at 1-800-FDA-1088. Where should I keep my medicine? This drug is given in a hospital or clinic and will not be stored at home. NOTE: This sheet is a summary. It may not cover all possible information. If you have questions about this medicine, talk to your doctor, pharmacist, or health care provider.  2015, [employer]. (2013-03-20 13:18:19)

## 2014-01-29 ENCOUNTER — Encounter (HOSPITAL_COMMUNITY): Payer: PPO

## 2014-01-29 ENCOUNTER — Encounter (HOSPITAL_COMMUNITY): Payer: Self-pay | Admitting: Oncology

## 2014-01-29 ENCOUNTER — Other Ambulatory Visit: Payer: Self-pay | Admitting: Radiology

## 2014-01-29 ENCOUNTER — Encounter (HOSPITAL_COMMUNITY): Payer: PPO | Attending: Hematology and Oncology | Admitting: Oncology

## 2014-01-29 VITALS — BP 134/78 | HR 91 | Temp 97.6°F | Resp 16 | Wt 173.4 lb

## 2014-01-29 DIAGNOSIS — C649 Malignant neoplasm of unspecified kidney, except renal pelvis: Secondary | ICD-10-CM | POA: Insufficient documentation

## 2014-01-29 DIAGNOSIS — R918 Other nonspecific abnormal finding of lung field: Secondary | ICD-10-CM | POA: Insufficient documentation

## 2014-01-29 DIAGNOSIS — R5383 Other fatigue: Secondary | ICD-10-CM | POA: Insufficient documentation

## 2014-01-29 DIAGNOSIS — K219 Gastro-esophageal reflux disease without esophagitis: Secondary | ICD-10-CM | POA: Insufficient documentation

## 2014-01-29 DIAGNOSIS — C7800 Secondary malignant neoplasm of unspecified lung: Secondary | ICD-10-CM

## 2014-01-29 MED ORDER — LIDOCAINE-PRILOCAINE 2.5-2.5 % EX CREA: TOPICAL_CREAM | CUTANEOUS | Status: AC

## 2014-01-29 NOTE — Progress Notes (Signed)
Opdivo teaching done. EMLA cream called into [facility]. Distress screening to be done on Monday with first Opdivo tx.

## 2014-01-30 ENCOUNTER — Inpatient Hospital Stay (HOSPITAL_COMMUNITY): Admission: RE | Admit: 2014-01-30 | Payer: Self-pay | Source: Ambulatory Visit

## 2014-01-30 ENCOUNTER — Ambulatory Visit (HOSPITAL_COMMUNITY)
Admission: RE | Admit: 2014-01-30 | Discharge: 2014-01-30 | Disposition: A | Discharge status: Home / Self Care | Payer: PPO | Source: Ambulatory Visit | Attending: Orthopedic Surgery | Admitting: Orthopedic Surgery

## 2014-01-30 DIAGNOSIS — M25511 Pain in right shoulder: Secondary | ICD-10-CM

## 2014-01-30 DIAGNOSIS — M25611 Stiffness of right shoulder, not elsewhere classified: Secondary | ICD-10-CM

## 2014-01-30 DIAGNOSIS — M6281 Muscle weakness (generalized): Secondary | ICD-10-CM

## 2014-01-30 DIAGNOSIS — Z5189 Encounter for other specified aftercare: Secondary | ICD-10-CM | POA: Diagnosis not present
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
## 2014-01-31 ENCOUNTER — Encounter (HOSPITAL_COMMUNITY): Payer: Self-pay

## 2014-01-31 ENCOUNTER — Ambulatory Visit
Admission: RE | Admit: 2014-01-31 | Discharge: 2014-01-31 | Disposition: A | Discharge status: Home / Self Care | Payer: PPO | Source: Ambulatory Visit

## 2014-01-31 ENCOUNTER — Ambulatory Visit (HOSPITAL_COMMUNITY)
Admission: RE | Admit: 2014-01-31 | Discharge: 2014-01-31 | Disposition: A | Discharge status: Home / Self Care | Payer: PPO | Source: Ambulatory Visit | Attending: Oncology | Admitting: Oncology

## 2014-01-31 ENCOUNTER — Other Ambulatory Visit (HOSPITAL_COMMUNITY): Payer: Self-pay | Admitting: Oncology

## 2014-01-31 ENCOUNTER — Other Ambulatory Visit: Payer: Self-pay | Admitting: Radiology

## 2014-01-31 DIAGNOSIS — C799 Secondary malignant neoplasm of unspecified site: Secondary | ICD-10-CM | POA: Diagnosis not present

## 2014-01-31 DIAGNOSIS — E119 Type 2 diabetes mellitus without complications: Secondary | ICD-10-CM | POA: Insufficient documentation

## 2014-01-31 DIAGNOSIS — Z85528 Personal history of other malignant neoplasm of kidney: Secondary | ICD-10-CM | POA: Insufficient documentation

## 2014-01-31 DIAGNOSIS — E780 Pure hypercholesterolemia: Secondary | ICD-10-CM | POA: Insufficient documentation

## 2014-01-31 DIAGNOSIS — F329 Major depressive disorder, single episode, unspecified: Secondary | ICD-10-CM | POA: Insufficient documentation

## 2014-01-31 DIAGNOSIS — I10 Essential (primary) hypertension: Secondary | ICD-10-CM | POA: Diagnosis not present

## 2014-01-31 DIAGNOSIS — K219 Gastro-esophageal reflux disease without esophagitis: Secondary | ICD-10-CM | POA: Diagnosis not present

## 2014-01-31 DIAGNOSIS — Z79899 Other long term (current) drug therapy: Secondary | ICD-10-CM | POA: Insufficient documentation

## 2014-01-31 DIAGNOSIS — Z905 Acquired absence of kidney: Secondary | ICD-10-CM | POA: Insufficient documentation

## 2014-01-31 DIAGNOSIS — C649 Malignant neoplasm of unspecified kidney, except renal pelvis: Secondary | ICD-10-CM

## 2014-01-31 DIAGNOSIS — Z1231 Encounter for screening mammogram for malignant neoplasm of breast: Secondary | ICD-10-CM

## 2014-01-31 DIAGNOSIS — Z452 Encounter for adjustment and management of vascular access device: Secondary | ICD-10-CM | POA: Insufficient documentation

## 2014-01-31 DIAGNOSIS — G4730 Sleep apnea, unspecified: Secondary | ICD-10-CM | POA: Insufficient documentation

## 2014-01-31 LAB — CBC WITH DIFFERENTIAL/PLATELET
BASOS ABS: 0 10*3/uL (ref 0.0–0.1)
BASOS PCT: 0 % (ref 0–1)
EOS PCT: 2 % (ref 0–5)
Eosinophils Absolute: 0.2 10*3/uL (ref 0.0–0.7)
HCT: 40.6 % (ref 36.0–46.0)
Hemoglobin: 13.5 g/dL (ref 12.0–15.0)
LYMPHS PCT: 18 % (ref 12–46)
Lymphs Abs: 1.4 10*3/uL (ref 0.7–4.0)
MCH: 33.4 pg (ref 26.0–34.0)
MCHC: 33.3 g/dL (ref 30.0–36.0)
MCV: 100.5 fL — AB (ref 78.0–100.0)
Monocytes Absolute: 0.4 10*3/uL (ref 0.1–1.0)
Monocytes Relative: 5 % (ref 3–12)
Neutro Abs: 5.8 10*3/uL (ref 1.7–7.7)
Neutrophils Relative %: 75 % (ref 43–77)
Platelets: 220 10*3/uL (ref 150–400)
RBC: 4.04 MIL/uL (ref 3.87–5.11)
RDW: 15.2 % (ref 11.5–15.5)
WBC: 7.8 10*3/uL (ref 4.0–10.5)

## 2014-01-31 LAB — PROTIME-INR
INR: 1.01 (ref 0.00–1.49)
Prothrombin Time: 13.4 seconds (ref 11.6–15.2)

## 2014-01-31 MED ORDER — CEFAZOLIN SODIUM-DEXTROSE 2-3 GM-% IV SOLR
2.0000 g | Freq: Once | INTRAVENOUS | Status: AC
  Administered 2014-01-31: 2 g via INTRAVENOUS

## 2014-01-31 MED ORDER — FENTANYL CITRATE 0.05 MG/ML IJ SOLN
INTRAMUSCULAR | Status: AC | PRN
  Administered 2014-01-31 (×3): 25 ug via INTRAVENOUS
  Administered 2014-01-31: 50 ug via INTRAVENOUS

## 2014-01-31 MED ORDER — MIDAZOLAM HCL 2 MG/2ML IJ SOLN
INTRAMUSCULAR | Status: AC | PRN
  Administered 2014-01-31: 0.5 mg via INTRAVENOUS
  Administered 2014-01-31: 1 mg via INTRAVENOUS
  Administered 2014-01-31: 0.5 mg via INTRAVENOUS
  Administered 2014-01-31: 1 mg via INTRAVENOUS

## 2014-01-31 MED ORDER — MIDAZOLAM HCL 2 MG/2ML IJ SOLN
INTRAMUSCULAR | Status: AC
Start: 2014-01-31 — End: 2014-01-31
  Filled 2014-01-31: qty 6

## 2014-01-31 MED ORDER — FENTANYL CITRATE 0.05 MG/ML IJ SOLN
INTRAMUSCULAR | Status: AC
  Filled 2014-01-31: qty 4

## 2014-01-31 MED ORDER — CEFAZOLIN SODIUM-DEXTROSE 2-3 GM-% IV SOLR
INTRAVENOUS | Status: AC
  Administered 2014-01-31: 2 g via INTRAVENOUS
  Filled 2014-01-31: qty 50

## 2014-01-31 MED ORDER — LIDOCAINE HCL 1 % IJ SOLN
INTRAMUSCULAR | Status: AC
  Filled 2014-01-31: qty 20

## 2014-01-31 MED ORDER — HEPARIN SOD (PORK) LOCK FLUSH 100 UNIT/ML IV SOLN
INTRAVENOUS | Status: AC | PRN
  Administered 2014-01-31: 500 [IU]

## 2014-01-31 MED ORDER — SODIUM CHLORIDE 0.9 % IV SOLN
INTRAVENOUS | Status: DC
  Administered 2014-01-31: 500 mL via INTRAVENOUS

## 2014-01-31 MED ORDER — HEPARIN SOD (PORK) LOCK FLUSH 100 UNIT/ML IV SOLN
INTRAVENOUS | Status: AC
  Filled 2014-01-31: qty 5
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
## 2014-01-31 NOTE — Procedures (Signed)
R IJ Port cathter placement with US and fluoroscopy No complication No blood loss. See complete dictation in [facility] PACS.  

## 2014-01-31 NOTE — Discharge Instructions (Signed)
Implanted Port Insertion, Care After °Refer to this sheet in the next few weeks. These instructions provide you with information on caring for yourself after your procedure. Your health care provider may also give you more specific instructions. Your treatment has been planned according to current medical practices, but problems sometimes occur. Call your health care provider if you have any problems or questions after your procedure. °WHAT TO EXPECT AFTER THE PROCEDURE °After your procedure, it is typical to have the following:  °· Discomfort at the port insertion site. Ice packs to the area will help. °· Bruising on the skin over the port. This will subside in 3-4 days. °HOME CARE INSTRUCTIONS °· After your port is placed, you will get a manufacturer's information card. The card has information about your port. Keep this card with you at all times.   °· Know what kind of port you have. There are many types of ports available.   °· Wear a medical alert bracelet in case of an emergency. This can help alert health care workers that you have a port.   °· The port can stay in for as long as your health care provider believes it is necessary.   °· A home health care nurse may give medicines and take care of the port.   °· You or a family member can get special training and directions for giving medicine and taking care of the port at home.   °SEEK MEDICAL CARE IF:  °· Your port does not flush or you are unable to get a blood return.   °· You have a fever or chills. °SEEK IMMEDIATE MEDICAL CARE IF: °· You have new fluid or pus coming from your incision.   °· You notice a bad smell coming from your incision site.   °· You have swelling, pain, or more redness at the incision or port site.   °· You have chest pain or shortness of breath. °Document Released: 10/19/2012 Document Revised: 01/03/2013 Document Reviewed: 10/19/2012 °ExitCare® Patient Information ©2015 [employer]. This information is not intended to replace  advice given to you by your health care provider. Make sure you discuss any questions you have with your health care provider. °Implanted Port Home Guide °An implanted port is a type of central line that is placed under the skin. Central lines are used to provide IV access when treatment or nutrition needs to be given through a person's veins. Implanted ports are used for long-term IV access. An implanted port may be placed because:  °· You need IV medicine that would be irritating to the small veins in your hands or arms.   °· You need long-term IV medicines, such as antibiotics.   °· You need IV nutrition for a long period.   °· You need frequent blood draws for lab tests.   °· You need dialysis.   °Implanted ports are usually placed in the chest area, but they can also be placed in the upper arm, the abdomen, or the leg. An implanted port has two main parts:  °· Reservoir. The reservoir is round and will appear as a small, raised area under your skin. The reservoir is the part where a needle is inserted to give medicines or draw blood.   °· Catheter. The catheter is a thin, flexible tube that extends from the reservoir. The catheter is placed into a large vein. Medicine that is inserted into the reservoir goes into the catheter and then into the vein.   °HOW WILL I CARE FOR MY INCISION SITE? °Do not get the incision site wet. Bathe or   shower as directed by your health care provider.  °HOW IS MY PORT ACCESSED? °Special steps must be taken to access the port:  °· Before the port is accessed, a numbing cream can be placed on the skin. This helps numb the skin over the port site.   °· Your health care provider uses a sterile technique to access the port. °· Your health care provider must put on a mask and sterile gloves. °· The skin over your port is cleaned carefully with an antiseptic and allowed to dry. °· The port is gently pinched between sterile gloves, and a needle is inserted into the port. °· Only  "non-coring" port needles should be used to access the port. Once the port is accessed, a blood return should be checked. This helps ensure that the port is in the vein and is not clogged.   °· If your port needs to remain accessed for a constant infusion, a clear (transparent) bandage will be placed over the needle site. The bandage and needle will need to be changed every week, or as directed by your health care provider.   °· Keep the bandage covering the needle clean and dry. Do not get it wet. Follow your health care provider's instructions on how to take a shower or bath while the port is accessed.   °· If your port does not need to stay accessed, no bandage is needed over the port.   °WHAT IS FLUSHING? °Flushing helps keep the port from getting clogged. Follow your health care provider's instructions on how and when to flush the port. Ports are usually flushed with saline solution or a medicine called heparin. The need for flushing will depend on how the port is used.  °· If the port is used for intermittent medicines or blood draws, the port will need to be flushed:   °· After medicines have been given.   °· After blood has been drawn.   °· As part of routine maintenance.   °· If a constant infusion is running, the port may not need to be flushed.   °HOW LONG WILL MY PORT STAY IMPLANTED? °The port can stay in for as long as your health care provider thinks it is needed. When it is time for the port to come out, surgery will be done to remove it. The procedure is similar to the one performed when the port was put in.  °WHEN SHOULD I SEEK IMMEDIATE MEDICAL CARE? °When you have an implanted port, you should seek immediate medical care if:  °· You notice a bad smell coming from the incision site.   °· You have swelling, redness, or drainage at the incision site.   °· You have more swelling or pain at the port site or the surrounding area.   °· You have a fever that is not controlled with medicine. °Document  Released: 12/29/2004 Document Revised: 10/19/2012 Document Reviewed: 09/05/2012 °ExitCare® Patient Information ©2015 [employer]. This information is not intended to replace advice given to you by your health care provider. Make sure you discuss any questions you have with your health care provider. °Conscious Sedation °Sedation is the use of medicines to promote relaxation and relieve discomfort and anxiety. Conscious sedation is a type of sedation. Under conscious sedation you are less alert than normal but are still able to respond to instructions or stimulation. Conscious sedation is used during short medical and dental procedures. It is milder than deep sedation or general anesthesia and allows you to return to your regular activities sooner.  °LET YOUR HEALTH CARE PROVIDER   KNOW ABOUT:  °· Any allergies you have. °· All medicines you are taking, including vitamins, herbs, eye drops, creams, and over-the-counter medicines. °· Use of steroids (by mouth or creams). °· Previous problems you or members of your family have had with the use of anesthetics. °· Any blood disorders you have. °· Previous surgeries you have had. °· Medical conditions you have. °· Possibility of pregnancy, if this applies. °· Use of cigarettes, alcohol, or illegal drugs. °RISKS AND COMPLICATIONS °Generally, this is a safe procedure. However, as with any procedure, problems can occur. Possible problems include: °· Oversedation. °· Trouble breathing on your own. You may need to have a breathing tube until you are awake and breathing on your own. °· Allergic reaction to any of the medicines used for the procedure. °BEFORE THE PROCEDURE °· You may have blood tests done. These tests can help show how well your kidneys and liver are working. They can also show how well your blood clots. °· A physical exam will be done.   °· Only take medicines as directed by your health care provider. You may need to stop taking medicines (such as blood  thinners, aspirin, or nonsteroidal anti-inflammatory drugs) before the procedure.   °· Do not eat or drink at least 6 hours before the procedure or as directed by your health care provider. °· Arrange for a responsible adult, family member, or friend to take you home after the procedure. He or she should stay with you for at least 24 hours after the procedure, until the medicine has worn off. °PROCEDURE  °· An intravenous (IV) catheter will be inserted into one of your veins. Medicine will be able to flow directly into your body through this catheter. You may be given medicine through this tube to help prevent pain and help you relax. °· The medical or dental procedure will be done. °AFTER THE PROCEDURE °· You will stay in a recovery area until the medicine has worn off. Your blood pressure and pulse will be checked.   °·  Depending on the procedure you had, you may be allowed to go home when you can tolerate liquids and your pain is under control. °Document Released: 09/23/2000 Document Revised: 01/03/2013 Document Reviewed: 09/05/2012 °ExitCare® Patient Information ©2015 [employer]. This information is not intended to replace advice given to you by your health care provider. Make sure you discuss any questions you have with your health care provider. °Conscious Sedation, Adult, Care After °Refer to this sheet in the next few weeks. These instructions provide you with information on caring for yourself after your procedure. Your health care provider may also give you more specific instructions. Your treatment has been planned according to current medical practices, but problems sometimes occur. Call your health care provider if you have any problems or questions after your procedure. °WHAT TO EXPECT AFTER THE PROCEDURE  °After your procedure: °· You may feel sleepy, clumsy, and have poor balance for several hours. °· Vomiting may occur if you eat too soon after the procedure. °HOME CARE INSTRUCTIONS °· Do not  participate in any activities where you could become injured for at least 24 hours. Do not: °¨ Drive. °¨ Swim. °¨ Ride a bicycle. °¨ Operate heavy machinery. °¨ Cook. °¨ Use power tools. °¨ Climb ladders. °¨ Work from a high place. °· Do not make important decisions or sign legal documents until you are improved. °· If you vomit, drink water, juice, or soup when you can drink without vomiting. Make sure you have little   or no nausea before eating solid foods. °· Only take over-the-counter or prescription medicines for pain, discomfort, or fever as directed by your health care provider. °· Make sure you and your family fully understand everything about the medicines given to you, including what side effects may occur. °· You should not drink alcohol, take sleeping pills, or take medicines that cause drowsiness for at least 24 hours. °· If you smoke, do not smoke without supervision. °· If you are feeling better, you may resume normal activities 24 hours after you were sedated. °· Keep all appointments with your health care provider. °SEEK MEDICAL CARE IF: °· Your skin is pale or bluish in color. °· You continue to feel nauseous or vomit. °· Your pain is getting worse and is not helped by medicine. °· You have bleeding or swelling. °· You are still sleepy or feeling clumsy after 24 hours. °SEEK IMMEDIATE MEDICAL CARE IF: °· You develop a rash. °· You have difficulty breathing. °· You develop any type of allergic problem. °· You have a fever. °MAKE SURE YOU: °· Understand these instructions. °· Will watch your condition. °· Will get help right away if you are not doing well or get worse. °Document Released: 10/19/2012 Document Reviewed: 10/19/2012 °ExitCare® Patient Information ©2015 [employer]. This information is not intended to replace advice given to you by your health care provider. Make sure you discuss any questions you have with your health care provider. ° °

## 2014-02-02 ENCOUNTER — Inpatient Hospital Stay (HOSPITAL_COMMUNITY): Admission: RE | Admit: 2014-02-02 | Payer: Self-pay | Source: Ambulatory Visit

## 2014-02-02 ENCOUNTER — Ambulatory Visit (HOSPITAL_COMMUNITY): Payer: PPO

## 2014-02-05 ENCOUNTER — Encounter (HOSPITAL_COMMUNITY): Payer: Self-pay | Admitting: Oncology

## 2014-02-05 ENCOUNTER — Encounter (HOSPITAL_COMMUNITY): Payer: Self-pay

## 2014-02-05 ENCOUNTER — Encounter (HOSPITAL_COMMUNITY): Payer: Self-pay | Admitting: Emergency Medicine

## 2014-02-05 ENCOUNTER — Encounter (HOSPITAL_BASED_OUTPATIENT_CLINIC_OR_DEPARTMENT_OTHER): Payer: PPO

## 2014-02-05 ENCOUNTER — Other Ambulatory Visit (HOSPITAL_COMMUNITY): Payer: Self-pay | Admitting: Oncology

## 2014-02-05 DIAGNOSIS — R5383 Other fatigue: Secondary | ICD-10-CM | POA: Diagnosis present

## 2014-02-05 DIAGNOSIS — R918 Other nonspecific abnormal finding of lung field: Secondary | ICD-10-CM | POA: Diagnosis present

## 2014-02-05 DIAGNOSIS — C7800 Secondary malignant neoplasm of unspecified lung: Secondary | ICD-10-CM

## 2014-02-05 DIAGNOSIS — E876 Hypokalemia: Secondary | ICD-10-CM

## 2014-02-05 DIAGNOSIS — C649 Malignant neoplasm of unspecified kidney, except renal pelvis: Secondary | ICD-10-CM

## 2014-02-05 DIAGNOSIS — K219 Gastro-esophageal reflux disease without esophagitis: Secondary | ICD-10-CM | POA: Diagnosis present

## 2014-02-05 DIAGNOSIS — Z5112 Encounter for antineoplastic immunotherapy: Secondary | ICD-10-CM

## 2014-02-05 LAB — COMPREHENSIVE METABOLIC PANEL
ALT: 13 U/L (ref 0–35)
AST: 20 U/L (ref 0–37)
Albumin: 3.7 g/dL (ref 3.5–5.2)
Alkaline Phosphatase: 59 U/L (ref 39–117)
Anion gap: 8 (ref 5–15)
BUN: 20 mg/dL (ref 6–23)
CO2: 19 mmol/L (ref 19–32)
Calcium: 9.9 mg/dL (ref 8.4–10.5)
Chloride: 112 mmol/L (ref 96–112)
Creatinine, Ser: 1.18 mg/dL — ABNORMAL HIGH (ref 0.50–1.10)
GFR, EST AFRICAN AMERICAN: 55 mL/min — AB (ref >90)
GFR, EST NON AFRICAN AMERICAN: 48 mL/min — AB (ref >90)
Glucose, Bld: 169 mg/dL — ABNORMAL HIGH (ref 70–99)
POTASSIUM: 3.4 mmol/L — AB (ref 3.5–5.1)
Sodium: 139 mmol/L (ref 135–145)
TOTAL PROTEIN: 7 g/dL (ref 6.0–8.3)
Total Bilirubin: 0.8 mg/dL (ref 0.3–1.2)

## 2014-02-05 LAB — CBC WITH DIFFERENTIAL/PLATELET
Basophils Absolute: 0 10*3/uL (ref 0.0–0.1)
Basophils Relative: 0 % (ref 0–1)
Eosinophils Absolute: 0.2 10*3/uL (ref 0.0–0.7)
Eosinophils Relative: 2 % (ref 0–5)
HCT: 36.6 % (ref 36.0–46.0)
HEMOGLOBIN: 12.3 g/dL (ref 12.0–15.0)
LYMPHS ABS: 1.3 10*3/uL (ref 0.7–4.0)
Lymphocytes Relative: 15 % (ref 12–46)
MCH: 33.9 pg (ref 26.0–34.0)
MCHC: 33.6 g/dL (ref 30.0–36.0)
MCV: 100.8 fL — AB (ref 78.0–100.0)
MONOS PCT: 3 % (ref 3–12)
Monocytes Absolute: 0.3 10*3/uL (ref 0.1–1.0)
NEUTROS ABS: 6.5 10*3/uL (ref 1.7–7.7)
Neutrophils Relative %: 80 % — ABNORMAL HIGH (ref 43–77)
PLATELETS: 207 10*3/uL (ref 150–400)
RBC: 3.63 MIL/uL — AB (ref 3.87–5.11)
RDW: 15.3 % (ref 11.5–15.5)
WBC: 8.2 10*3/uL (ref 4.0–10.5)

## 2014-02-05 LAB — TSH: TSH: 0.433 u[IU]/mL (ref 0.350–4.500)

## 2014-02-05 MED ORDER — POTASSIUM CHLORIDE CRYS ER 20 MEQ PO TBCR: 20.0000 meq | EXTENDED_RELEASE_TABLET | Freq: Every day | ORAL | Status: DC

## 2014-02-05 MED ORDER — SODIUM CHLORIDE 0.9 % IJ SOLN
10.0000 mL | INTRAMUSCULAR | Status: DC | PRN
Start: 2014-02-05 — End: 2014-02-05
  Administered 2014-02-05: 10 mL
  Filled 2014-02-05: qty 10

## 2014-02-05 MED ORDER — SODIUM CHLORIDE 0.9 % IV SOLN
3.0000 mg/kg | Freq: Once | INTRAVENOUS | Status: AC
  Administered 2014-02-05: 240 mg via INTRAVENOUS
  Filled 2014-02-05: qty 24

## 2014-02-05 MED ORDER — HEPARIN SOD (PORK) LOCK FLUSH 100 UNIT/ML IV SOLN
500.0000 [IU] | Freq: Once | INTRAVENOUS | Status: AC | PRN
  Administered 2014-02-05: 500 [IU]
  Filled 2014-02-05: qty 5

## 2014-02-05 MED ORDER — SODIUM CHLORIDE 0.9 % IV SOLN
Freq: Once | INTRAVENOUS | Status: AC
  Administered 2014-02-05: 10:00:00 via INTRAVENOUS

## 2014-02-05 NOTE — Progress Notes (Signed)
Patient tolerated chemotherapy well.  Port flushed after treatment.

## 2014-02-05 NOTE — Progress Notes (Signed)
Called pt to notify her that potassium was low and prescription was sent to the pharmacy.

## 2014-02-05 NOTE — Patient Instructions (Signed)
[facility] Discharge Instructions for Patients Receiving Chemotherapy  Today you received the following chemotherapy agents  Opdivo (Nivolumab).  Please follow up as scheduled.  Call for any questions or concerns.      If you develop nausea and vomiting, or diarrhea that is not controlled by your medication, call the clinic.  The clinic phone number is [phone]. Office hours are Monday-Friday 8:30am-5:00pm.  BELOW ARE SYMPTOMS THAT SHOULD BE REPORTED IMMEDIATELY:  *FEVER GREATER THAN 101.0 F  *CHILLS WITH OR WITHOUT FEVER  NAUSEA AND VOMITING THAT IS NOT CONTROLLED WITH YOUR NAUSEA MEDICATION  *UNUSUAL SHORTNESS OF BREATH  *UNUSUAL BRUISING OR BLEEDING  TENDERNESS IN MOUTH AND THROAT WITH OR WITHOUT PRESENCE OF ULCERS  *URINARY PROBLEMS  *BOWEL PROBLEMS  UNUSUAL RASH Items with * indicate a potential emergency and should be followed up as soon as possible. If you have an emergency after office hours please contact your primary care physician or go to the nearest emergency department.  Please call the clinic during office hours if you have any questions or concerns.   You may also contact the Patient Navigator at [phone] should you have any questions or need assistance in obtaining follow up care. _____________________________________________________________________ Have you asked about our STAR program?    STAR stands for Survivorship Training and Rehabilitation, and this is a nationally recognized cancer care program that focuses on survivorship and rehabilitation.  Cancer and cancer treatments may cause problems, such as, pain, making you feel tired and keeping you from doing the things that you need or want to do. Cancer rehabilitation can help. Our goal is to reduce these troubling effects and help you have the best quality of life possible.  You may receive a survey from a nurse that asks questions about your current state of health.  Based  on the survey results, all eligible patients will be referred to the [facility] program for an evaluation so we can better serve you! A frequently asked questions sheet is available upon request.

## 2014-02-06 ENCOUNTER — Encounter (HOSPITAL_COMMUNITY): Payer: Self-pay

## 2014-02-06 ENCOUNTER — Telehealth (HOSPITAL_COMMUNITY): Payer: Self-pay | Admitting: Oncology

## 2014-02-06 ENCOUNTER — Telehealth (HOSPITAL_COMMUNITY): Payer: Self-pay | Admitting: *Deleted

## 2014-02-06 ENCOUNTER — Ambulatory Visit (HOSPITAL_COMMUNITY)
Admission: RE | Admit: 2014-02-06 | Discharge: 2014-02-06 | Disposition: A | Discharge status: Home / Self Care | Payer: PPO | Source: Ambulatory Visit | Attending: Orthopedic Surgery | Admitting: Orthopedic Surgery

## 2014-02-06 DIAGNOSIS — M25611 Stiffness of right shoulder, not elsewhere classified: Secondary | ICD-10-CM | POA: Insufficient documentation

## 2014-02-06 DIAGNOSIS — M6281 Muscle weakness (generalized): Secondary | ICD-10-CM | POA: Diagnosis not present

## 2014-02-06 DIAGNOSIS — M25511 Pain in right shoulder: Secondary | ICD-10-CM | POA: Diagnosis present

## 2014-02-06 NOTE — Telephone Encounter (Signed)
FAXED [place]
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
## 2014-02-06 NOTE — Telephone Encounter (Signed)
24h follow up: Answering machine picked up. Message left asking patient to call me and let me know how she was doing since having Opdivo tx yday.

## 2014-02-07 ENCOUNTER — Telehealth (HOSPITAL_COMMUNITY): Payer: Self-pay | Admitting: Hematology & Oncology

## 2014-02-07 NOTE — Telephone Encounter (Signed)
Pt is enrolled in PAN

## 2014-02-08 ENCOUNTER — Ambulatory Visit (HOSPITAL_COMMUNITY)
Admission: RE | Admit: 2014-02-08 | Discharge: 2014-02-08 | Disposition: A | Discharge status: Home / Self Care | Payer: PPO | Source: Ambulatory Visit | Attending: Physician Assistant | Admitting: Physician Assistant

## 2014-02-08 DIAGNOSIS — M25511 Pain in right shoulder: Secondary | ICD-10-CM | POA: Diagnosis not present

## 2014-02-08 DIAGNOSIS — M25611 Stiffness of right shoulder, not elsewhere classified: Secondary | ICD-10-CM

## 2014-02-08 DIAGNOSIS — M6281 Muscle weakness (generalized): Secondary | ICD-10-CM
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
## 2014-02-12 ENCOUNTER — Other Ambulatory Visit: Payer: Self-pay | Admitting: Physician Assistant

## 2014-02-12 ENCOUNTER — Encounter (HOSPITAL_COMMUNITY): Payer: Self-pay | Admitting: *Deleted

## 2014-02-12 NOTE — Progress Notes (Signed)
Prior Auth Methylphenidate 20 mg  Approved from 02-12-2014 until 01-12-2015 Approval code is [id number]

## 2014-02-12 NOTE — Telephone Encounter (Signed)
Medication refilled per protocol. 

## 2014-02-13 ENCOUNTER — Ambulatory Visit (HOSPITAL_COMMUNITY)
Admission: RE | Admit: 2014-02-13 | Discharge: 2014-02-13 | Disposition: A | Discharge status: Home / Self Care | Payer: PPO | Source: Ambulatory Visit | Attending: Orthopedic Surgery | Admitting: Orthopedic Surgery

## 2014-02-13 DIAGNOSIS — M25611 Stiffness of right shoulder, not elsewhere classified: Secondary | ICD-10-CM | POA: Diagnosis not present

## 2014-02-13 DIAGNOSIS — M25511 Pain in right shoulder: Secondary | ICD-10-CM

## 2014-02-13 DIAGNOSIS — M6281 Muscle weakness (generalized): Secondary | ICD-10-CM | POA: Insufficient documentation
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
## 2014-02-14 ENCOUNTER — Ambulatory Visit (INDEPENDENT_AMBULATORY_CARE_PROVIDER_SITE_OTHER): Payer: PPO | Admitting: Psychiatry

## 2014-02-14 DIAGNOSIS — F418 Other specified anxiety disorders: Secondary | ICD-10-CM

## 2014-02-14 NOTE — Progress Notes (Signed)
      THERAPIST PROGRESS NOTE  Session Time: Wednesday 02/14/2014 1:15 PM -1:55 PM  Participation Level: Active  Behavioral Response: CasualAlert/ anxious, depressed,tearful  Type of Therapy: Individual Therapy  Treatment Goals addressed: Improve ability to manage stress and transitions with decreased irritability and feelings of being overwhelmed  Interventions: CBT and Supportive  Summary: [name] [name] is a 64 y.o. female who presents with symptoms of depression and anxiety that have been intermittent for several years that have been well controlled until patient was diagnosed with cancer in 2013. Symptoms have worsened in recent months as patient reports constantly thinking about her diagnoses and fears cancer will spread. She has been given a prognosis of 2-4 years to live. Patient reports taking chemotherapy pills and reports extreme fatigue. Patient also experiences depression, anxiety, and crying spells  Patient reports increased stress, anxiety, depressed mood, and uncontrollable crying spells. She states starting to feel one step closer to death when her oncologist decreased one of her medications in an attempt to reduce patient's nausea. However, patient interpreted this as "what['s the use of giving her this medication, she is at the end". However, she reports doctors have given her a new cancer medication that is supposed to prolong her life and states feeling better since taking it. However, she has constant worry that she is going to die soon. She also worries about the welfare of her family once she dies.  She continues to have negative spiraling thoughts including this was the end regarding her life and fear her husband would die before she dies.   Suicidal/Homicidal: No  Therapist Response: Therapist works with patient to process feelings, identify thinking errors and effects on mood and behavior, identify ways to intervene in negative spiraling thought patterns including  coping statements, review relaxation and coping techniques  Plan: Return again in 4 weeks. Patient is scheduled to see psychiatrist Dr. Harrington Challenger on 02/26/2014 and will discuss concerns regarding medication.  Diagnosis: Axis I: Depressive Disorder NOS    Axis II: Deferred    Tulani Kidney, LCSW 02/14/2014

## 2014-02-14 NOTE — Patient Instructions (Signed)
Discussed orally 

## 2014-02-16 ENCOUNTER — Ambulatory Visit (HOSPITAL_COMMUNITY): Payer: PPO

## 2014-02-19 ENCOUNTER — Encounter (HOSPITAL_COMMUNITY): Payer: Self-pay

## 2014-02-19 ENCOUNTER — Encounter (HOSPITAL_COMMUNITY): Payer: PPO | Attending: Hematology and Oncology

## 2014-02-19 DIAGNOSIS — R5383 Other fatigue: Secondary | ICD-10-CM | POA: Diagnosis present

## 2014-02-19 DIAGNOSIS — R918 Other nonspecific abnormal finding of lung field: Secondary | ICD-10-CM | POA: Diagnosis present

## 2014-02-19 DIAGNOSIS — K219 Gastro-esophageal reflux disease without esophagitis: Secondary | ICD-10-CM | POA: Insufficient documentation

## 2014-02-19 DIAGNOSIS — C649 Malignant neoplasm of unspecified kidney, except renal pelvis: Secondary | ICD-10-CM

## 2014-02-19 DIAGNOSIS — Z5112 Encounter for antineoplastic immunotherapy: Secondary | ICD-10-CM

## 2014-02-19 LAB — CBC WITH DIFFERENTIAL/PLATELET
Basophils Absolute: 0 10*3/uL (ref 0.0–0.1)
Basophils Relative: 0 % (ref 0–1)
EOS ABS: 0.2 10*3/uL (ref 0.0–0.7)
Eosinophils Relative: 2 % (ref 0–5)
HCT: 31.7 % — ABNORMAL LOW (ref 36.0–46.0)
HEMOGLOBIN: 10.4 g/dL — AB (ref 12.0–15.0)
Lymphocytes Relative: 15 % (ref 12–46)
Lymphs Abs: 1 10*3/uL (ref 0.7–4.0)
MCH: 33.3 pg (ref 26.0–34.0)
MCHC: 32.8 g/dL (ref 30.0–36.0)
MCV: 101.6 fL — ABNORMAL HIGH (ref 78.0–100.0)
Monocytes Absolute: 0.4 10*3/uL (ref 0.1–1.0)
Monocytes Relative: 5 % (ref 3–12)
Neutro Abs: 5.2 10*3/uL (ref 1.7–7.7)
Neutrophils Relative %: 77 % (ref 43–77)
Platelets: 206 10*3/uL (ref 150–400)
RBC: 3.12 MIL/uL — ABNORMAL LOW (ref 3.87–5.11)
RDW: 14 % (ref 11.5–15.5)
WBC: 6.7 10*3/uL (ref 4.0–10.5)

## 2014-02-19 LAB — COMPREHENSIVE METABOLIC PANEL
ALT: 16 U/L (ref 0–35)
AST: 18 U/L (ref 0–37)
Albumin: 3.5 g/dL (ref 3.5–5.2)
Alkaline Phosphatase: 68 U/L (ref 39–117)
Anion gap: 4 — ABNORMAL LOW (ref 5–15)
BUN: 36 mg/dL — ABNORMAL HIGH (ref 6–23)
CO2: 15 mmol/L — AB (ref 19–32)
CREATININE: 1.46 mg/dL — AB (ref 0.50–1.10)
Calcium: 9.6 mg/dL (ref 8.4–10.5)
Chloride: 119 mmol/L — ABNORMAL HIGH (ref 96–112)
GFR calc Af Amer: 43 mL/min — ABNORMAL LOW (ref >90)
GFR, EST NON AFRICAN AMERICAN: 37 mL/min — AB (ref >90)
Glucose, Bld: 134 mg/dL — ABNORMAL HIGH (ref 70–99)
Potassium: 5.3 mmol/L — ABNORMAL HIGH (ref 3.5–5.1)
Sodium: 138 mmol/L (ref 135–145)
TOTAL PROTEIN: 6.9 g/dL (ref 6.0–8.3)
Total Bilirubin: 0.5 mg/dL (ref 0.3–1.2)

## 2014-02-19 LAB — TSH: TSH: 0.025 u[IU]/mL — AB (ref 0.350–4.500)

## 2014-02-19 MED ORDER — SODIUM CHLORIDE 0.9 % IV SOLN
3.0000 mg/kg | Freq: Once | INTRAVENOUS | Status: AC
  Administered 2014-02-19: 240 mg via INTRAVENOUS
  Filled 2014-02-19: qty 24

## 2014-02-19 MED ORDER — SODIUM CHLORIDE 0.9 % IJ SOLN: 10.0000 mL | INTRAMUSCULAR | Status: DC | PRN

## 2014-02-19 MED ORDER — HEPARIN SOD (PORK) LOCK FLUSH 100 UNIT/ML IV SOLN
500.0000 [IU] | Freq: Once | INTRAVENOUS | Status: AC | PRN
  Administered 2014-02-19: 500 [IU]
  Filled 2014-02-19: qty 5

## 2014-02-19 MED ORDER — SODIUM CHLORIDE 0.9 % IV SOLN
Freq: Once | INTRAVENOUS | Status: AC
  Administered 2014-02-19: 10:00:00 via INTRAVENOUS

## 2014-02-19 NOTE — Progress Notes (Signed)
Patient tolerated treatment well.

## 2014-02-19 NOTE — Patient Instructions (Signed)
[facility] Discharge Instructions for Patients Receiving Chemotherapy  Today you received the following chemotherapy agents opdivo Call the clinic if you have any questions or concerns, follow up as scheduled  To help prevent nausea and vomiting after your treatment, we encourage you to take your nausea medication    If you develop nausea and vomiting that is not controlled by your nausea medication, call the clinic. If it is after clinic hours your family physician or the after hours number for the clinic or go to the Emergency Department.   BELOW ARE SYMPTOMS THAT SHOULD BE REPORTED IMMEDIATELY:  *FEVER GREATER THAN 101.0 F  *CHILLS WITH OR WITHOUT FEVER  NAUSEA AND VOMITING THAT IS NOT CONTROLLED WITH YOUR NAUSEA MEDICATION  *UNUSUAL SHORTNESS OF BREATH  *UNUSUAL BRUISING OR BLEEDING  TENDERNESS IN MOUTH AND THROAT WITH OR WITHOUT PRESENCE OF ULCERS  *URINARY PROBLEMS  *BOWEL PROBLEMS  UNUSUAL RASH Items with * indicate a potential emergency and should be followed up as soon as possible.  One of the nurses will contact you 24 hours after your treatment. Please let the nurse know about any problems that you may have experienced. Feel free to call the clinic you have any questions or concerns. The clinic phone number is [phone].   I have been informed and understand all the instructions given to me. I know to contact the clinic, my physician, or go to the Emergency Department if any problems should occur. I do not have any questions at this time, but understand that I may call the clinic during office hours or the Patient Navigator at [phone] should I have any questions or need assistance in obtaining follow up care.    Nivolumab injection What is this medicine? NIVOLUMAB (nye VOL ue mab) is used to treat certain types of melanoma and lung cancer. This medicine may be used for other purposes; ask your health care provider or pharmacist if you  have questions. COMMON BRAND NAME(S): Opdivo What should I tell my health care provider before I take this medicine? They need to know if you have any of these conditions: -eye disease, vision problems -history of pancreatitis -immune system problems -inflammatory bowel disease -kidney disease -liver disease -lung disease -lupus -myasthenia gravis -multiple sclerosis -organ transplant -stomach or intestine problems -thyroid disease -tingling of the fingers or toes, or other nerve disorder -an unusual or allergic reaction to nivolumab, other medicines, foods, dyes, or preservatives -pregnant or trying to get pregnant -breast-feeding How should I use this medicine? This medicine is for infusion into a vein. It is given by a health care professional in a hospital or clinic setting. A special MedGuide will be given to you before each treatment. Be sure to read this information carefully each time. Talk to your pediatrician regarding the use of this medicine in children. Special care may be needed. Overdosage: If you think you've taken too much of this medicine contact a poison control center or emergency room at once. Overdosage: If you think you have taken too much of this medicine contact a poison control center or emergency room at once. NOTE: This medicine is only for you. Do not share this medicine with others. What if I miss a dose? It is important not to miss your dose. Call your doctor or health care professional if you are unable to keep an appointment. What may interact with this medicine? Interactions have not been studied. This list may not describe all possible interactions. Give your  health care provider a list of all the medicines, herbs, non-prescription drugs, or dietary supplements you use. Also tell them if you smoke, drink alcohol, or use illegal drugs. Some items may interact with your medicine. What should I watch for while using this medicine? Tell your doctor or  healthcare professional if your symptoms do not start to get better or if they get worse. Your condition will be monitored carefully while you are receiving this medicine. You may need blood work done while you are taking this medicine. What side effects may I notice from receiving this medicine? Side effects that you should report to your doctor or health care professional as soon as possible: -allergic reactions like skin rash, itching or hives, swelling of the face, lips, or tongue -black, tarry stools -bloody or watery diarrhea -changes in vision -chills -cough -depressed mood -eye pain -feeling anxious -fever -general ill feeling or flu-like symptoms -hair loss -loss of appetite -low blood counts - this medicine may decrease the number of white blood cells, red blood cells and platelets. You may be at increased risk for infections and bleeding -pain, tingling, numbness in the hands or feet -redness, blistering, peeling or loosening of the skin, including inside the mouth -red pinpoint spots on skin -signs of decreased platelets or bleeding - bruising, pinpoint red spots on the skin, black, tarry stools, blood in the urine -signs of decreased red blood cells - unusually weak or tired, feeling faint or lightheaded, falls -signs of infection - fever or chills, cough, sore throat, pain or trouble passing urine -signs and symptoms of a dangerous change in heartbeat or heart rhythm like chest pain; dizziness; fast or irregular heartbeat; palpitations; feeling faint or lightheaded, falls; breathing problems -signs and symptoms of high blood sugar such as dizziness; dry mouth; dry skin; fruity breath; nausea; stomach pain; increased hunger or thirst; increased urination -signs and symptoms of kidney injury like trouble passing urine or change in the amount of urine -signs and symptoms of liver injury like dark yellow or brown urine; general ill feeling or flu-like symptoms; light-colored  stools; loss of appetite; nausea; right upper belly pain; unusually weak or tired; yellowing of the eyes or skin -signs and symptoms of increased potassium like muscle weakness; chest pain; or fast, irregular heartbeat -signs and symptoms of low potassium like muscle cramps or muscle pain; chest pain; dizziness; feeling faint or lightheaded, falls; palpitations; breathing problems; or fast, irregular heartbeat -swelling of the ankles, feet, hands -weight gainSide effects that usually do not require medical attention (report to your doctor or health care professional if they continue or are bothersome): -constipation -general ill feeling or flu-like symptoms -hair loss -loss of appetite -nausea, vomiting This list may not describe all possible side effects. Call your doctor for medical advice about side effects. You may report side effects to FDA at 1-800-FDA-1088. Where should I keep my medicine? This drug is given in a hospital or clinic and will not be stored at home. NOTE: This sheet is a summary. It may not cover all possible information. If you have questions about this medicine, talk to your doctor, pharmacist, or health care provider.  2015, Elsevier/Gold Standard. (2013-03-20 13:18:19)

## 2014-02-19 NOTE — Progress Notes (Signed)
..  [facility] Discharge Instructions for Patients Receiving Chemotherapy  Today you received the following chemotherapy agents opdivo  To help prevent nausea and vomiting after your treatment, we encourage you to take your nausea medication. If you develop nausea and vomiting, or diarrhea that is not controlled by your medication, call the clinic.  The clinic phone number is [phone]. Office hours are Monday-Friday 8:30am-5:00pm.  BELOW ARE SYMPTOMS THAT SHOULD BE REPORTED IMMEDIATELY:  *FEVER GREATER THAN 101.0 F  *CHILLS WITH OR WITHOUT FEVER  NAUSEA AND VOMITING THAT IS NOT CONTROLLED WITH YOUR NAUSEA MEDICATION  *UNUSUAL SHORTNESS OF BREATH  *UNUSUAL BRUISING OR BLEEDING  TENDERNESS IN MOUTH AND THROAT WITH OR WITHOUT PRESENCE OF ULCERS  *URINARY PROBLEMS  *BOWEL PROBLEMS  UNUSUAL RASH Items with * indicate a potential emergency and should be followed up as soon as possible. If you have an emergency after office hours please contact your primary care physician or go to the nearest emergency department.  Please call the clinic during office hours if you have any questions or concerns.   You may also contact the Patient Navigator at [phone] should you have any questions or need assistance in obtaining follow up care. _____________________________________________________________________ Have you asked about our STAR program?    STAR stands for Survivorship Training and Rehabilitation, and this is a nationally recognized cancer care program that focuses on survivorship and rehabilitation.  Cancer and cancer treatments may cause problems, such as, pain, making you feel tired and keeping you from doing the things that you need or want to do. Cancer rehabilitation can help. Our goal is to reduce these troubling effects and help you have the best quality of life possible.  You may receive a survey from a nurse that asks questions about your current state  of health.  Based on the survey results, all eligible patients will be referred to the [facility] program for an evaluation so we can better serve you! A frequently asked questions sheet is available upon request.

## 2014-02-20 ENCOUNTER — Ambulatory Visit (HOSPITAL_COMMUNITY)
Admission: RE | Admit: 2014-02-20 | Discharge: 2014-02-20 | Disposition: A | Discharge status: Home / Self Care | Payer: PPO | Source: Ambulatory Visit | Attending: Physician Assistant | Admitting: Physician Assistant

## 2014-02-20 ENCOUNTER — Encounter (HOSPITAL_COMMUNITY): Payer: Self-pay

## 2014-02-20 DIAGNOSIS — M25511 Pain in right shoulder: Secondary | ICD-10-CM

## 2014-02-20 DIAGNOSIS — M6281 Muscle weakness (generalized): Secondary | ICD-10-CM

## 2014-02-20 DIAGNOSIS — M25611 Stiffness of right shoulder, not elsewhere classified: Secondary | ICD-10-CM
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
## 2014-02-21 ENCOUNTER — Encounter (HOSPITAL_BASED_OUTPATIENT_CLINIC_OR_DEPARTMENT_OTHER): Payer: PPO | Admitting: Hematology & Oncology

## 2014-02-21 ENCOUNTER — Encounter (HOSPITAL_BASED_OUTPATIENT_CLINIC_OR_DEPARTMENT_OTHER): Payer: PPO

## 2014-02-21 ENCOUNTER — Other Ambulatory Visit (HOSPITAL_COMMUNITY): Payer: Self-pay | Admitting: Oncology

## 2014-02-21 VITALS — BP 106/55 | HR 108 | Temp 97.3°F | Resp 20 | Wt 176.6 lb

## 2014-02-21 DIAGNOSIS — C649 Malignant neoplasm of unspecified kidney, except renal pelvis: Secondary | ICD-10-CM

## 2014-02-21 DIAGNOSIS — E875 Hyperkalemia: Secondary | ICD-10-CM

## 2014-02-21 DIAGNOSIS — F32A Depression, unspecified: Secondary | ICD-10-CM

## 2014-02-21 DIAGNOSIS — C7800 Secondary malignant neoplasm of unspecified lung: Secondary | ICD-10-CM

## 2014-02-21 DIAGNOSIS — F329 Major depressive disorder, single episode, unspecified: Secondary | ICD-10-CM

## 2014-02-21 DIAGNOSIS — R110 Nausea: Secondary | ICD-10-CM

## 2014-02-21 LAB — CREATININE, SERUM
CREATININE: 1.58 mg/dL — AB (ref 0.50–1.10)
GFR calc Af Amer: 39 mL/min — ABNORMAL LOW (ref >90)
GFR, EST NON AFRICAN AMERICAN: 34 mL/min — AB (ref >90)

## 2014-02-21 LAB — POTASSIUM: Potassium: 6 mmol/L — ABNORMAL HIGH (ref 3.5–5.1)

## 2014-02-21 MED ORDER — SODIUM POLYSTYRENE SULFONATE 15 GM/60ML PO SUSP: ORAL | Status: DC

## 2014-02-21 MED ORDER — SODIUM POLYSTYRENE SULFONATE PO POWD: ORAL | Status: DC

## 2014-02-21 NOTE — Patient Instructions (Addendum)
[place] at [facility] Discharge Instructions  RECOMMENDATIONS MADE BY THE CONSULTANT AND ANY TEST RESULTS WILL BE SENT TO YOUR REFERRING PHYSICIAN.   Exam and discussion by Dr. Whitney Muse.   Your potassium level is too high and you need to stop taking the oral potassium. Take the Kayexalate as soon as you get it and repeat at bedtime.  Call with any issues or concerns. Will seen you back with next treatment and recheck your potassium level.      Thank you for choosing [place] at [facility] to provide your oncology and hematology care.  To afford each patient quality time with our provider, please arrive at least 15 minutes before your scheduled appointment time.    You need to re-schedule your appointment should you arrive 10 or more minutes late.  We strive to give you quality time with our providers, and arriving late affects you and other patients whose appointments are after yours.  Also, if you no show three or more times for appointments you may be dismissed from the clinic at the providers discretion.     Again, thank you for choosing [facility].  Our hope is that these requests will decrease the amount of time that you wait before being seen by our physicians.       _____________________________________________________________  Should you have questions after your visit to [facility], please contact our office at [phone] between the hours of 8:30 a.m. and 4:30 p.m.  Voicemails left after 4:30 p.m. will not be returned until the following business day.  For prescription refill requests, have your pharmacy contact our office.

## 2014-02-21 NOTE — Progress Notes (Signed)
CRITICAL VALUE ALERT  Critical value received:  Potassium 6.0   Date of notification:  02/21/14  Time of notification:  0102  Critical value read back:Yes.    Nurse who received alert:  A. Ouida Sills, RN  MD notified:  Dr. Whitney Muse  Time of first page:  [phone]
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
## 2014-02-21 NOTE — Progress Notes (Signed)
LABS DRAWN

## 2014-02-23 ENCOUNTER — Ambulatory Visit (HOSPITAL_COMMUNITY): Payer: PPO

## 2014-02-23 NOTE — Telephone Encounter (Signed)
Pt not feeling well.

## 2014-02-26 ENCOUNTER — Ambulatory Visit (HOSPITAL_COMMUNITY): Payer: Self-pay | Admitting: Psychiatry

## 2014-02-27 ENCOUNTER — Ambulatory Visit (INDEPENDENT_AMBULATORY_CARE_PROVIDER_SITE_OTHER): Payer: PPO | Admitting: Psychiatry

## 2014-02-27 ENCOUNTER — Ambulatory Visit (HOSPITAL_COMMUNITY)
Admission: RE | Admit: 2014-02-27 | Discharge: 2014-02-27 | Disposition: A | Discharge status: Home / Self Care | Payer: PPO | Source: Ambulatory Visit | Attending: Hematology & Oncology | Admitting: Hematology & Oncology

## 2014-02-27 ENCOUNTER — Encounter (HOSPITAL_COMMUNITY): Payer: Self-pay | Admitting: Psychiatry

## 2014-02-27 VITALS — BP 129/70 | HR 101 | Ht 66.0 in | Wt 174.0 lb

## 2014-02-27 DIAGNOSIS — F329 Major depressive disorder, single episode, unspecified: Secondary | ICD-10-CM

## 2014-02-27 DIAGNOSIS — F418 Other specified anxiety disorders: Secondary | ICD-10-CM

## 2014-02-27 DIAGNOSIS — M25511 Pain in right shoulder: Secondary | ICD-10-CM | POA: Diagnosis not present

## 2014-02-27 DIAGNOSIS — M25611 Stiffness of right shoulder, not elsewhere classified: Secondary | ICD-10-CM

## 2014-02-27 DIAGNOSIS — M6281 Muscle weakness (generalized): Secondary | ICD-10-CM

## 2014-02-27 MED ORDER — ESCITALOPRAM OXALATE 20 MG PO TABS: 20.0000 mg | ORAL_TABLET | Freq: Two times a day (BID) | ORAL | Status: DC

## 2014-02-27 MED ORDER — ALPRAZOLAM 1 MG PO TABS: 1.0000 mg | ORAL_TABLET | Freq: Three times a day (TID) | ORAL | Status: DC | PRN

## 2014-02-27 MED ORDER — ZOLPIDEM TARTRATE 5 MG PO TABS: 5.0000 mg | ORAL_TABLET | Freq: Every evening | ORAL | Status: DC | PRN
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
## 2014-03-01 ENCOUNTER — Ambulatory Visit (HOSPITAL_COMMUNITY): Payer: PPO

## 2014-03-01 ENCOUNTER — Encounter (HOSPITAL_COMMUNITY): Payer: Self-pay

## 2014-03-01 DIAGNOSIS — M25611 Stiffness of right shoulder, not elsewhere classified: Secondary | ICD-10-CM

## 2014-03-01 DIAGNOSIS — M25511 Pain in right shoulder: Secondary | ICD-10-CM

## 2014-03-01 DIAGNOSIS — M6281 Muscle weakness (generalized): Secondary | ICD-10-CM
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
## 2014-03-04 ENCOUNTER — Encounter (HOSPITAL_COMMUNITY): Payer: Self-pay | Admitting: Hematology & Oncology

## 2014-03-04 DIAGNOSIS — E875 Hyperkalemia: Secondary | ICD-10-CM | POA: Insufficient documentation

## 2014-03-04 NOTE — Assessment & Plan Note (Signed)
Pleasant 64 year old female with stage IV renal cell carcinoma currently on nivolumab. She realistically had no problems tolerating treatment. She has underlying nausea which on questioning is chronic and daily. It is not relieved easily with anti-emetics nor PPIs. It is definitely a quality of life issue for her. I have recommended referring her to Dr. Laural Golden for an EGD. We will plan on seeing her back next week prior to her next cycle of chemotherapy with laboratory studies prior.

## 2014-03-04 NOTE — Assessment & Plan Note (Signed)
She has hypokalemia today. Potassium is not on her medication list but she is currently taking it. I advised her to discontinue her potassium supplementation. We have prescribed her Kayexalate. I advised her on appropriate use of Kayexalate. We will recheck her potassium in the next several days. She is also on an ACE inhibitor. However I do not feel the ACE inhibitor is the current cause; I feel it is  her potassium supplementation.

## 2014-03-04 NOTE — Assessment & Plan Note (Signed)
She currently sees a psychiatrist for her problems with depression. I reviewed with [name] [name] in detail the process of grieving. I advised her that over the next several weeks I can see her and we can work on some of her issues. I advised her that the way she feels is certainly not abnormal. We discussed trying to "live in the moment" and how difficult that is. We will continue to work on her emotional struggles moving forward as well.

## 2014-03-05 ENCOUNTER — Encounter (HOSPITAL_COMMUNITY): Payer: Self-pay | Admitting: Hematology & Oncology

## 2014-03-05 ENCOUNTER — Encounter (HOSPITAL_BASED_OUTPATIENT_CLINIC_OR_DEPARTMENT_OTHER): Payer: PPO

## 2014-03-05 ENCOUNTER — Encounter (HOSPITAL_BASED_OUTPATIENT_CLINIC_OR_DEPARTMENT_OTHER): Payer: PPO | Admitting: Hematology & Oncology

## 2014-03-05 VITALS — BP 96/48 | HR 60 | Temp 97.9°F | Resp 15 | Wt 175.9 lb

## 2014-03-05 DIAGNOSIS — C649 Malignant neoplasm of unspecified kidney, except renal pelvis: Secondary | ICD-10-CM

## 2014-03-05 DIAGNOSIS — Z5112 Encounter for antineoplastic immunotherapy: Secondary | ICD-10-CM

## 2014-03-05 LAB — CBC WITH DIFFERENTIAL/PLATELET
BASOS PCT: 0 % (ref 0–1)
Basophils Absolute: 0 10*3/uL (ref 0.0–0.1)
Eosinophils Absolute: 0.3 10*3/uL (ref 0.0–0.7)
Eosinophils Relative: 5 % (ref 0–5)
HEMATOCRIT: 32.2 % — AB (ref 36.0–46.0)
HEMOGLOBIN: 10.4 g/dL — AB (ref 12.0–15.0)
Lymphocytes Relative: 18 % (ref 12–46)
Lymphs Abs: 1.3 10*3/uL (ref 0.7–4.0)
MCH: 32.2 pg (ref 26.0–34.0)
MCHC: 32.3 g/dL (ref 30.0–36.0)
MCV: 99.7 fL (ref 78.0–100.0)
MONOS PCT: 4 % (ref 3–12)
Monocytes Absolute: 0.3 10*3/uL (ref 0.1–1.0)
NEUTROS ABS: 5.4 10*3/uL (ref 1.7–7.7)
Neutrophils Relative %: 73 % (ref 43–77)
Platelets: 225 10*3/uL (ref 150–400)
RBC: 3.23 MIL/uL — ABNORMAL LOW (ref 3.87–5.11)
RDW: 13.9 % (ref 11.5–15.5)
WBC: 7.4 10*3/uL (ref 4.0–10.5)

## 2014-03-05 LAB — COMPREHENSIVE METABOLIC PANEL
ALK PHOS: 64 U/L (ref 39–117)
ALT: 13 U/L (ref 0–35)
AST: 18 U/L (ref 0–37)
Albumin: 3.5 g/dL (ref 3.5–5.2)
BUN: 31 mg/dL — AB (ref 6–23)
CO2: 21 mmol/L (ref 19–32)
CREATININE: 1.25 mg/dL — AB (ref 0.50–1.10)
Calcium: 9.2 mg/dL (ref 8.4–10.5)
Chloride: 117 mmol/L — ABNORMAL HIGH (ref 96–112)
GFR calc Af Amer: 52 mL/min — ABNORMAL LOW (ref >90)
GFR, EST NON AFRICAN AMERICAN: 44 mL/min — AB (ref >90)
GLUCOSE: 143 mg/dL — AB (ref 70–99)
POTASSIUM: 4.5 mmol/L (ref 3.5–5.1)
Sodium: 140 mmol/L (ref 135–145)
Total Bilirubin: 0.5 mg/dL (ref 0.3–1.2)
Total Protein: 6.6 g/dL (ref 6.0–8.3)

## 2014-03-05 LAB — TSH: TSH: 0.049 u[IU]/mL — AB (ref 0.350–4.500)

## 2014-03-05 MED ORDER — SODIUM CHLORIDE 0.9 % IJ SOLN
10.0000 mL | INTRAMUSCULAR | Status: DC | PRN
  Administered 2014-03-05: 10 mL
  Filled 2014-03-05: qty 10

## 2014-03-05 MED ORDER — HEPARIN SOD (PORK) LOCK FLUSH 100 UNIT/ML IV SOLN
500.0000 [IU] | Freq: Once | INTRAVENOUS | Status: AC | PRN
  Administered 2014-03-05: 500 [IU]
  Filled 2014-03-05: qty 5

## 2014-03-05 MED ORDER — SODIUM CHLORIDE 0.9 % IV SOLN
3.0000 mg/kg | Freq: Once | INTRAVENOUS | Status: AC
  Administered 2014-03-05: 240 mg via INTRAVENOUS
  Filled 2014-03-05: qty 24

## 2014-03-05 MED ORDER — SODIUM CHLORIDE 0.9 % IV SOLN
Freq: Once | INTRAVENOUS | Status: AC
  Administered 2014-03-05: 11:00:00 via INTRAVENOUS

## 2014-03-05 NOTE — Progress Notes (Signed)
Tolerated chemo well. 

## 2014-03-05 NOTE — Assessment & Plan Note (Signed)
64 year old female with stage IV renal cell carcinoma currently on nivolumab. [name] [name]'s biggest issue is struggling with acceptance of her end-of-life issues. We will continue to work on these moving forward. She is somewhat better today. She is cleared for her next cycle of treatment. We will plan on seeing her back in 2 weeks prior to her next dose. I have again left a very open ended with her and advised her to let us know prior to follow-up should she have any needs or concerns.

## 2014-03-05 NOTE — Patient Instructions (Signed)
[facility] Discharge Instructions for Patients Receiving Chemotherapy  Today you received the following chemotherapy agents Opdivo. To help prevent nausea and vomiting after your treatment, we encourage you to take your nausea medication as instructed. If you develop nausea and vomiting that is not controlled by your nausea medication, call the clinic. If it is after clinic hours your family physician or the after hours number for the clinic or go to the Emergency Department.  BELOW ARE SYMPTOMS THAT SHOULD BE REPORTED IMMEDIATELY:  *FEVER GREATER THAN 101.0 F  *CHILLS WITH OR WITHOUT FEVER  NAUSEA AND VOMITING THAT IS NOT CONTROLLED WITH YOUR NAUSEA MEDICATION  *UNUSUAL SHORTNESS OF BREATH  *UNUSUAL BRUISING OR BLEEDING  TENDERNESS IN MOUTH AND THROAT WITH OR WITHOUT PRESENCE OF ULCERS  *URINARY PROBLEMS  *BOWEL PROBLEMS  UNUSUAL RASH Items with * indicate a potential emergency and should be followed up as soon as possible.  Return as scheduled.  I have been informed and understand all the instructions given to me. I know to contact the clinic, my physician, or go to the Emergency Department if any problems should occur. I do not have any questions at this time, but understand that I may call the clinic during office hours or the Patient Navigator at [phone] should I have any questions or need assistance in obtaining follow up care.    __________________________________________  _____________  __________ Signature of Patient or Authorized Representative            Date                   Time    __________________________________________ Nurse's Signature

## 2014-03-05 NOTE — Patient Instructions (Addendum)
[facility] at [facility] Discharge Instructions  RECOMMENDATIONS MADE BY THE CONSULTANT AND ANY TEST RESULTS WILL BE SENT TO YOUR REFERRING PHYSICIAN.  Exam and discussion by Dr. Whitney Muse.    Referral has been made to Dr. Laural Golden - they have the referral and they will contact you with an appointment. Report any uncontrolled pain, nausea, or other  issues or concerns.  Follow-up in 2 weeks.  Thank you for choosing [facility] to provide your oncology and hematology care.  To afford each patient quality time with our provider, please arrive at least 15 minutes before your scheduled appointment time.    You need to re-schedule your appointment should you arrive 10 or more minutes late.  We strive to give you quality time with our providers, and arriving late affects you and other patients whose appointments are after yours.  Also, if you no show three or more times for appointments you may be dismissed from the clinic at the providers discretion.     Again, thank you for choosing [facility].  Our hope is that these requests will decrease the amount of time that you wait before being seen by our physicians.       _____________________________________________________________  Should you have questions after your visit to [facility] please contact our office at [phone] between the hours of 8:30 a.m. and 4:30 p.m.  Voicemails left after 4:30 p.m. will not be returned until the following business day.  For prescription refill requests, have your pharmacy contact our office.
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
## 2014-03-06 ENCOUNTER — Ambulatory Visit (HOSPITAL_COMMUNITY): Payer: PPO | Admitting: Specialist

## 2014-03-06 DIAGNOSIS — M25511 Pain in right shoulder: Secondary | ICD-10-CM | POA: Diagnosis not present

## 2014-03-06 DIAGNOSIS — M25611 Stiffness of right shoulder, not elsewhere classified: Secondary | ICD-10-CM

## 2014-03-06 DIAGNOSIS — M6281 Muscle weakness (generalized): Secondary | ICD-10-CM
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
## 2014-03-07 ENCOUNTER — Encounter (INDEPENDENT_AMBULATORY_CARE_PROVIDER_SITE_OTHER): Payer: Self-pay | Admitting: *Deleted

## 2014-03-08 ENCOUNTER — Ambulatory Visit (HOSPITAL_COMMUNITY): Payer: PPO

## 2014-03-08 ENCOUNTER — Encounter (HOSPITAL_COMMUNITY): Payer: Self-pay

## 2014-03-08 DIAGNOSIS — M6281 Muscle weakness (generalized): Secondary | ICD-10-CM

## 2014-03-08 DIAGNOSIS — M25511 Pain in right shoulder: Secondary | ICD-10-CM | POA: Diagnosis not present

## 2014-03-08 DIAGNOSIS — M25611 Stiffness of right shoulder, not elsewhere classified: Secondary | ICD-10-CM
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
## 2014-03-13 ENCOUNTER — Encounter (HOSPITAL_COMMUNITY): Payer: Self-pay | Admitting: Specialist

## 2014-03-13 ENCOUNTER — Encounter (HOSPITAL_COMMUNITY): Payer: Self-pay | Admitting: Psychiatry

## 2014-03-13 ENCOUNTER — Ambulatory Visit (INDEPENDENT_AMBULATORY_CARE_PROVIDER_SITE_OTHER): Payer: PPO | Admitting: Psychiatry

## 2014-03-13 VITALS — BP 126/88 | HR 57 | Ht 66.0 in | Wt 178.0 lb

## 2014-03-13 DIAGNOSIS — F329 Major depressive disorder, single episode, unspecified: Secondary | ICD-10-CM

## 2014-03-13 DIAGNOSIS — F418 Other specified anxiety disorders: Secondary | ICD-10-CM

## 2014-03-13 MED ORDER — ALPRAZOLAM 1 MG PO TABS: 1.0000 mg | ORAL_TABLET | Freq: Four times a day (QID) | ORAL | Status: DC
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
## 2014-03-14 ENCOUNTER — Ambulatory Visit (HOSPITAL_COMMUNITY): Payer: Self-pay | Admitting: Psychiatry

## 2014-03-16 ENCOUNTER — Encounter (HOSPITAL_COMMUNITY): Payer: Self-pay | Admitting: *Deleted

## 2014-03-16 ENCOUNTER — Ambulatory Visit (HOSPITAL_COMMUNITY): Payer: Self-pay | Admitting: Psychiatry

## 2014-03-16 ENCOUNTER — Ambulatory Visit (HOSPITAL_COMMUNITY): Payer: PPO | Admitting: Specialist

## 2014-03-19 ENCOUNTER — Encounter (HOSPITAL_COMMUNITY): Payer: PPO | Attending: Hematology and Oncology

## 2014-03-19 ENCOUNTER — Encounter (HOSPITAL_BASED_OUTPATIENT_CLINIC_OR_DEPARTMENT_OTHER): Payer: PPO | Admitting: Hematology & Oncology

## 2014-03-19 ENCOUNTER — Other Ambulatory Visit (HOSPITAL_COMMUNITY): Payer: Self-pay | Admitting: *Deleted

## 2014-03-19 ENCOUNTER — Encounter (HOSPITAL_COMMUNITY): Payer: Self-pay | Admitting: Hematology & Oncology

## 2014-03-19 DIAGNOSIS — F329 Major depressive disorder, single episode, unspecified: Secondary | ICD-10-CM

## 2014-03-19 DIAGNOSIS — R918 Other nonspecific abnormal finding of lung field: Secondary | ICD-10-CM | POA: Diagnosis present

## 2014-03-19 DIAGNOSIS — C649 Malignant neoplasm of unspecified kidney, except renal pelvis: Secondary | ICD-10-CM

## 2014-03-19 DIAGNOSIS — K219 Gastro-esophageal reflux disease without esophagitis: Secondary | ICD-10-CM | POA: Diagnosis present

## 2014-03-19 DIAGNOSIS — R5383 Other fatigue: Secondary | ICD-10-CM | POA: Insufficient documentation

## 2014-03-19 DIAGNOSIS — Z5112 Encounter for antineoplastic immunotherapy: Secondary | ICD-10-CM

## 2014-03-19 LAB — COMPREHENSIVE METABOLIC PANEL
ALK PHOS: 51 U/L (ref 39–117)
ALT: 11 U/L (ref 0–35)
AST: 20 U/L (ref 0–37)
Albumin: 3.7 g/dL (ref 3.5–5.2)
Anion gap: 9 (ref 5–15)
BUN: 22 mg/dL (ref 6–23)
CALCIUM: 9.4 mg/dL (ref 8.4–10.5)
CO2: 22 mmol/L (ref 19–32)
Chloride: 109 mmol/L (ref 96–112)
Creatinine, Ser: 1.32 mg/dL — ABNORMAL HIGH (ref 0.50–1.10)
GFR calc Af Amer: 48 mL/min — ABNORMAL LOW (ref >90)
GFR, EST NON AFRICAN AMERICAN: 42 mL/min — AB (ref >90)
GLUCOSE: 150 mg/dL — AB (ref 70–99)
Potassium: 4 mmol/L (ref 3.5–5.1)
Sodium: 140 mmol/L (ref 135–145)
TOTAL PROTEIN: 6.7 g/dL (ref 6.0–8.3)
Total Bilirubin: 0.3 mg/dL (ref 0.3–1.2)

## 2014-03-19 LAB — CBC WITH DIFFERENTIAL/PLATELET
Basophils Absolute: 0 10*3/uL (ref 0.0–0.1)
Basophils Relative: 0 % (ref 0–1)
EOS PCT: 4 % (ref 0–5)
Eosinophils Absolute: 0.3 10*3/uL (ref 0.0–0.7)
HCT: 32.9 % — ABNORMAL LOW (ref 36.0–46.0)
HEMOGLOBIN: 10.7 g/dL — AB (ref 12.0–15.0)
LYMPHS ABS: 1.1 10*3/uL (ref 0.7–4.0)
Lymphocytes Relative: 16 % (ref 12–46)
MCH: 32.6 pg (ref 26.0–34.0)
MCHC: 32.5 g/dL (ref 30.0–36.0)
MCV: 100.3 fL — AB (ref 78.0–100.0)
MONOS PCT: 3 % (ref 3–12)
Monocytes Absolute: 0.2 10*3/uL (ref 0.1–1.0)
Neutro Abs: 5.5 10*3/uL (ref 1.7–7.7)
Neutrophils Relative %: 77 % (ref 43–77)
PLATELETS: 170 10*3/uL (ref 150–400)
RBC: 3.28 MIL/uL — ABNORMAL LOW (ref 3.87–5.11)
RDW: 14.4 % (ref 11.5–15.5)
WBC: 7.1 10*3/uL (ref 4.0–10.5)

## 2014-03-19 LAB — TSH: TSH: 4.572 u[IU]/mL — ABNORMAL HIGH (ref 0.350–4.500)

## 2014-03-19 MED ORDER — SODIUM CHLORIDE 0.9 % IV SOLN
3.0000 mg/kg | Freq: Once | INTRAVENOUS | Status: AC
  Administered 2014-03-19: 240 mg via INTRAVENOUS
  Filled 2014-03-19: qty 20

## 2014-03-19 MED ORDER — SODIUM CHLORIDE 0.9 % IJ SOLN
10.0000 mL | INTRAMUSCULAR | Status: DC | PRN
  Administered 2014-03-19: 10 mL
  Filled 2014-03-19: qty 10

## 2014-03-19 MED ORDER — SODIUM CHLORIDE 0.9 % IV SOLN
Freq: Once | INTRAVENOUS | Status: AC
  Administered 2014-03-19: 11:00:00 via INTRAVENOUS

## 2014-03-19 MED ORDER — HEPARIN SOD (PORK) LOCK FLUSH 100 UNIT/ML IV SOLN
500.0000 [IU] | Freq: Once | INTRAVENOUS | Status: AC | PRN
  Administered 2014-03-19: 500 [IU]
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
## 2014-03-19 NOTE — Patient Instructions (Signed)
[place] at [facility]  Discharge Instructions:  You are going to receive opdivo today.  Return in 2 weeks to receive chemotherapy again and to see the doctor.  If you have any questions or concerns before then please call the clinic. _______________________________________________________________  Thank you for choosing [place] at [facility] to provide your oncology and hematology care.  To afford each patient quality time with our providers, please arrive at least 15 minutes before your scheduled appointment.  You need to re-schedule your appointment if you arrive 10 or more minutes late.  We strive to give you quality time with our providers, and arriving late affects you and other patients whose appointments are after yours.  Also, if you no show three or more times for appointments you may be dismissed from the clinic.  Again, thank you for choosing [facility] [place] hope is that these requests will allow you access to exceptional care and in a timely manner. _______________________________________________________________  If you have questions after your visit, please contact our office at [phone] between the hours of 8:30 a.m. and 5:00 p.m. Voicemails left after 4:30 p.m. will not be returned until the following business day. _______________________________________________________________  For prescription refill requests, have your pharmacy contact our office. _______________________________________________________________  Recommendations made by the consultant and any test results will be sent to your referring physician. _______________________________________________________________  Ardine Eng injection What is this medicine? NIVOLUMAB (nye VOL ue mab) is used to treat certain types of melanoma and lung cancer. This medicine may be used for other purposes; ask your health care provider or  pharmacist if you have questions. COMMON BRAND NAME(S): Opdivo What should I tell my health care provider before I take this medicine? They need to know if you have any of these conditions: -eye disease, vision problems -history of pancreatitis -immune system problems -inflammatory bowel disease -kidney disease -liver disease -lung disease -lupus -myasthenia gravis -multiple sclerosis -organ transplant -stomach or intestine problems -thyroid disease -tingling of the fingers or toes, or other nerve disorder -an unusual or allergic reaction to nivolumab, other medicines, foods, dyes, or preservatives -pregnant or trying to get pregnant -breast-feeding How should I use this medicine? This medicine is for infusion into a vein. It is given by a health care professional in a hospital or clinic setting. A special MedGuide will be given to you before each treatment. Be sure to read this information carefully each time. Talk to your pediatrician regarding the use of this medicine in children. Special care may be needed. Overdosage: If you think you've taken too much of this medicine contact a poison control center or emergency room at once. Overdosage: If you think you have taken too much of this medicine contact a poison control center or emergency room at once. NOTE: This medicine is only for you. Do not share this medicine with others. What if I miss a dose? It is important not to miss your dose. Call your doctor or health care professional if you are unable to keep an appointment. What may interact with this medicine? Interactions have not been studied. This list may not describe all possible interactions. Give your health care provider a list of all the medicines, herbs, non-prescription drugs, or dietary supplements you use. Also tell them if you smoke, drink alcohol, or use illegal drugs. Some items may interact with your medicine. What should I watch for while using this  medicine? Tell  your doctor or healthcare professional if your symptoms do not start to get better or if they get worse. Your condition will be monitored carefully while you are receiving this medicine. You may need blood work done while you are taking this medicine. What side effects may I notice from receiving this medicine? Side effects that you should report to your doctor or health care professional as soon as possible: -allergic reactions like skin rash, itching or hives, swelling of the face, lips, or tongue -black, tarry stools -bloody or watery diarrhea -changes in vision -chills -cough -depressed mood -eye pain -feeling anxious -fever -general ill feeling or flu-like symptoms -hair loss -loss of appetite -low blood counts - this medicine may decrease the number of white blood cells, red blood cells and platelets. You may be at increased risk for infections and bleeding -pain, tingling, numbness in the hands or feet -redness, blistering, peeling or loosening of the skin, including inside the mouth -red pinpoint spots on skin -signs of decreased platelets or bleeding - bruising, pinpoint red spots on the skin, black, tarry stools, blood in the urine -signs of decreased red blood cells - unusually weak or tired, feeling faint or lightheaded, falls -signs of infection - fever or chills, cough, sore throat, pain or trouble passing urine -signs and symptoms of a dangerous change in heartbeat or heart rhythm like chest pain; dizziness; fast or irregular heartbeat; palpitations; feeling faint or lightheaded, falls; breathing problems -signs and symptoms of high blood sugar such as dizziness; dry mouth; dry skin; fruity breath; nausea; stomach pain; increased hunger or thirst; increased urination -signs and symptoms of kidney injury like trouble passing urine or change in the amount of urine -signs and symptoms of liver injury like dark yellow or brown urine; general ill feeling or  flu-like symptoms; light-colored stools; loss of appetite; nausea; right upper belly pain; unusually weak or tired; yellowing of the eyes or skin -signs and symptoms of increased potassium like muscle weakness; chest pain; or fast, irregular heartbeat -signs and symptoms of low potassium like muscle cramps or muscle pain; chest pain; dizziness; feeling faint or lightheaded, falls; palpitations; breathing problems; or fast, irregular heartbeat -swelling of the ankles, feet, hands -weight gainSide effects that usually do not require medical attention (report to your doctor or health care professional if they continue or are bothersome): -constipation -general ill feeling or flu-like symptoms -hair loss -loss of appetite -nausea, vomiting This list may not describe all possible side effects. Call your doctor for medical advice about side effects. You may report side effects to FDA at 1-800-FDA-1088. Where should I keep my medicine? This drug is given in a hospital or clinic and will not be stored at home. NOTE: This sheet is a summary. It may not cover all possible information. If you have questions about this medicine, talk to your doctor, pharmacist, or health care provider.  2015, Elsevier/Gold Standard. (2013-03-20 13:18:19)

## 2014-03-19 NOTE — Progress Notes (Signed)
1320 Tolerated chemo well.

## 2014-03-19 NOTE — Patient Instructions (Signed)
[place] at [facility]  Discharge Instructions:  You are going to receive opdivo today.  Return in 2 weeks to receive chemotherapy again and to see the doctor.  If you have any questions or concerns before then please call the clinic. _______________________________________________________________  Thank you for choosing [place] at [facility] to provide your oncology and hematology care.  To afford each patient quality time with our providers, please arrive at least 15 minutes before your scheduled appointment.  You need to re-schedule your appointment if you arrive 10 or more minutes late.  We strive to give you quality time with our providers, and arriving late affects you and other patients whose appointments are after yours.  Also, if you no show three or more times for appointments you may be dismissed from the clinic.  Again, thank you for choosing [place] at [place] hope is that these requests will allow you access to exceptional care and in a timely manner. _______________________________________________________________  If you have questions after your visit, please contact our office at [phone] between the hours of 8:30 a.m. and 5:00 p.m. Voicemails left after 4:30 p.m. will not be returned until the following business day. _______________________________________________________________  For prescription refill requests, have your pharmacy contact our office. _______________________________________________________________  Recommendations made by the consultant and any test results will be sent to your referring physician. _______________________________________________________________  Ardine Eng injection What is this medicine? NIVOLUMAB (nye VOL ue mab) is used to treat certain types of melanoma and lung cancer. This medicine may be used for other purposes; ask your health care provider or  pharmacist if you have questions. COMMON BRAND NAME(S): Opdivo What should I tell my health care provider before I take this medicine? They need to know if you have any of these conditions: -eye disease, vision problems -history of pancreatitis -immune system problems -inflammatory bowel disease -kidney disease -liver disease -lung disease -lupus -myasthenia gravis -multiple sclerosis -organ transplant -stomach or intestine problems -thyroid disease -tingling of the fingers or toes, or other nerve disorder -an unusual or allergic reaction to nivolumab, other medicines, foods, dyes, or preservatives -pregnant or trying to get pregnant -breast-feeding How should I use this medicine? This medicine is for infusion into a vein. It is given by a health care professional in a hospital or clinic setting. A special MedGuide will be given to you before each treatment. Be sure to read this information carefully each time. Talk to your pediatrician regarding the use of this medicine in children. Special care may be needed. Overdosage: If you think you've taken too much of this medicine contact a poison control center or emergency room at once. Overdosage: If you think you have taken too much of this medicine contact a poison control center or emergency room at once. NOTE: This medicine is only for you. Do not share this medicine with others. What if I miss a dose? It is important not to miss your dose. Call your doctor or health care professional if you are unable to keep an appointment. What may interact with this medicine? Interactions have not been studied. This list may not describe all possible interactions. Give your health care provider a list of all the medicines, herbs, non-prescription drugs, or dietary supplements you use. Also tell them if you smoke, drink alcohol, or use illegal drugs. Some items may interact with your medicine. What should I watch for while using this  medicine? Tell  your doctor or healthcare professional if your symptoms do not start to get better or if they get worse. Your condition will be monitored carefully while you are receiving this medicine. You may need blood work done while you are taking this medicine. What side effects may I notice from receiving this medicine? Side effects that you should report to your doctor or health care professional as soon as possible: -allergic reactions like skin rash, itching or hives, swelling of the face, lips, or tongue -black, tarry stools -bloody or watery diarrhea -changes in vision -chills -cough -depressed mood -eye pain -feeling anxious -fever -general ill feeling or flu-like symptoms -hair loss -loss of appetite -low blood counts - this medicine may decrease the number of white blood cells, red blood cells and platelets. You may be at increased risk for infections and bleeding -pain, tingling, numbness in the hands or feet -redness, blistering, peeling or loosening of the skin, including inside the mouth -red pinpoint spots on skin -signs of decreased platelets or bleeding - bruising, pinpoint red spots on the skin, black, tarry stools, blood in the urine -signs of decreased red blood cells - unusually weak or tired, feeling faint or lightheaded, falls -signs of infection - fever or chills, cough, sore throat, pain or trouble passing urine -signs and symptoms of a dangerous change in heartbeat or heart rhythm like chest pain; dizziness; fast or irregular heartbeat; palpitations; feeling faint or lightheaded, falls; breathing problems -signs and symptoms of high blood sugar such as dizziness; dry mouth; dry skin; fruity breath; nausea; stomach pain; increased hunger or thirst; increased urination -signs and symptoms of kidney injury like trouble passing urine or change in the amount of urine -signs and symptoms of liver injury like dark yellow or brown urine; general ill feeling or  flu-like symptoms; light-colored stools; loss of appetite; nausea; right upper belly pain; unusually weak or tired; yellowing of the eyes or skin -signs and symptoms of increased potassium like muscle weakness; chest pain; or fast, irregular heartbeat -signs and symptoms of low potassium like muscle cramps or muscle pain; chest pain; dizziness; feeling faint or lightheaded, falls; palpitations; breathing problems; or fast, irregular heartbeat -swelling of the ankles, feet, hands -weight gainSide effects that usually do not require medical attention (report to your doctor or health care professional if they continue or are bothersome): -constipation -general ill feeling or flu-like symptoms -hair loss -loss of appetite -nausea, vomiting This list may not describe all possible side effects. Call your doctor for medical advice about side effects. You may report side effects to FDA at 1-800-FDA-1088. Where should I keep my medicine? This drug is given in a hospital or clinic and will not be stored at home. NOTE: This sheet is a summary. It may not cover all possible information. If you have questions about this medicine, talk to your doctor, pharmacist, or health care provider.  2015, Elsevier/Gold Standard. (2013-03-20 13:18:19)

## 2014-03-20 ENCOUNTER — Ambulatory Visit (HOSPITAL_COMMUNITY): Payer: PPO | Admitting: Occupational Therapy

## 2014-03-21 ENCOUNTER — Other Ambulatory Visit (HOSPITAL_COMMUNITY): Payer: Self-pay | Admitting: Oncology

## 2014-03-21 DIAGNOSIS — E039 Hypothyroidism, unspecified: Secondary | ICD-10-CM

## 2014-03-21 MED ORDER — LEVOTHYROXINE SODIUM 50 MCG PO TABS: 50.0000 ug | ORAL_TABLET | Freq: Every day | ORAL | Status: DC

## 2014-03-22 ENCOUNTER — Encounter (HOSPITAL_COMMUNITY): Payer: Self-pay | Admitting: Occupational Therapy

## 2014-03-22 ENCOUNTER — Ambulatory Visit (HOSPITAL_COMMUNITY): Payer: PPO | Attending: Physician Assistant | Admitting: Occupational Therapy

## 2014-03-22 DIAGNOSIS — Z5189 Encounter for other specified aftercare: Secondary | ICD-10-CM | POA: Insufficient documentation

## 2014-03-22 DIAGNOSIS — M6281 Muscle weakness (generalized): Secondary | ICD-10-CM | POA: Insufficient documentation

## 2014-03-22 DIAGNOSIS — M25611 Stiffness of right shoulder, not elsewhere classified: Secondary | ICD-10-CM | POA: Diagnosis not present

## 2014-03-22 DIAGNOSIS — M25511 Pain in right shoulder: Secondary | ICD-10-CM | POA: Insufficient documentation
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
## 2014-03-22 NOTE — Patient Instructions (Signed)
Strengthening: Chest Pull - Resisted   Hold Theraband in front of body with hands about shoulder width a part. Pull band a part and back together slowly. Repeat ___10_ times. Complete __1__ set(s) per session.. Repeat 1-2____ session(s) per day.  http://orth.exer.us/926   Copyright  VHI. All rights reserved.   PNF Strengthening: Resisted   Standing with resistive band around each hand, bring right arm up and away, thumb back. Repeat __10__ times per set. Do __1__ sets per session. Do _1-2___ sessions per day.  http://orth.exer.us/918   Copyright  VHI. All rights reserved.   PNF Strengthening: Resisted   Standing with resistive band around each hand, bring right arm up and across body. Repeat __10__ times per set. Do __1__ sets per session. Do _1-2___ sessions per day.  http://orth.exer.us/920   Copyright  VHI. All rights reserved.    Resisted External Rotation: in Neutral - Bilateral   Sit or stand, tubing in both hands, elbows at sides, bent to 90, forearms forward. Pinch shoulder blades together and rotate forearms out. Keep elbows at sides. Repeat _10___ times per set. Do __1__ sets per session. Do _1-2___ sessions per day.  http://orth.exer.us/966   Copyright  VHI. All rights reserved.   PNF Strengthening: Resisted   Standing, hold resistive band above head. Bring right arm down and out from side. Repeat _10___ times per set. Do _1__ sets per session. Do _1-2___ sessions per day.  http://orth.exer.us/922   Copyright  VHI. All rights reserved.

## 2014-03-23 ENCOUNTER — Ambulatory Visit (INDEPENDENT_AMBULATORY_CARE_PROVIDER_SITE_OTHER): Payer: PPO | Admitting: Psychiatry

## 2014-03-23 DIAGNOSIS — F418 Other specified anxiety disorders: Secondary | ICD-10-CM

## 2014-03-23 NOTE — Progress Notes (Signed)
      THERAPIST PROGRESS NOTE  Session Time: Friday 03/23/2014  1:35 PM - 2:30 PM  Participation Level: Active  Behavioral Response: CasualAlert/ anxious, depressed,tearful  Type of Therapy: Individual Therapy  Treatment Goals addressed: Improve ability to manage stress and transitions with decreased irritability and feelings of being overwhelmed  Interventions: CBT and Supportive  Summary: [name] [name] is a 64 y.o. female who presents with symptoms of depression and anxiety that have been intermittent for several years that have been well controlled until patient was diagnosed with cancer in 2013. Symptoms have worsened in recent months as patient reports constantly thinking about her diagnoses and fears cancer will spread. She has been given a prognosis of 2-4 years to live. Patient reports taking chemotherapy pills and reports extreme fatigue. Patient also experiences depression, anxiety, and crying spells  Patient reports continued stress, anxiety, depressed mood, and uncontrollable crying spells. She reports having suicidal thoughts of taking a bottle of pills and sending a good-bye text to her family about three weeks ago. She reports having thoughts of I can't take it anymore last week but denies having any intent or plan. She denies current suicidal ideations. She has begun taking lexapro and increased xanax as instructed by psychiatrist Dr. Harrington Challenger. Patient reports decreased crying spells but still experiencing depression, anxiety,loss of interest in activities, and excessive worry. She reports stress related to trying to complete taxes as her sister plans to help her and patient feels she is a burden to sister. She also is worried about the spiritual well-being and salvation of her children and grandchildren. She reports additional stress related to going to see oncologist in [place] on Monday 03/26/2014. She has reconnected with a former pastor in [place] and reports this has been  helpful. She has been to church since last session and plans to attend this Sunday.    Suicidal/Homicidal:  She reports having suicidal thoughts of taking a bottle of pills and sending a good-bye text to her family about three weeks ago but then deciding against it.  She reports having thoughts of "I can't take it anymore"  last week but denies having any intent or plan. She denies current suicidal ideations. She agrees to call this practice, call 911, or have someone take her to the emergency room should symptoms worsen.  Therapist Response: Therapist works with patient to discuss safety concerns and possibility of hospitalization. Patient denies current suicidal ideations along with any intent or plan. She does say she has considered hospitalization but knows she will face the same problems once she is out of the hospital. She says if she does go to the hospital, she would rather go after she sees her oncologist on Monday, 03/26/2014 as she does not want to have to wait for another appointment. Therapist works with patient to process feelings, identify ways to communicate concerns/needs to sister as well as pace herself regarding completing the taxes, identify ways to intervene in negative spiraling thought patterns including coping statements, and encouraged patient to maintain contact with her former pastor.  Plan: Return again in 4 days. Patient  agrees to call this practice, call 911, or have someone take her to the emergency room should symptoms worsen.    Diagnosis: Axis I: Depressive Disorder NOS    Axis II: Deferred    Arthor Gorter, LCSW 03/23/2014

## 2014-03-23 NOTE — Patient Instructions (Signed)
Discussed orally 

## 2014-03-27 ENCOUNTER — Encounter (HOSPITAL_COMMUNITY): Payer: Self-pay | Admitting: Emergency Medicine

## 2014-03-27 ENCOUNTER — Emergency Department (HOSPITAL_COMMUNITY)
Admission: EM | Admit: 2014-03-27 | Discharge: 2014-03-27 | Disposition: A | Discharge status: Home / Self Care | Payer: PPO | Attending: Emergency Medicine | Admitting: Emergency Medicine

## 2014-03-27 ENCOUNTER — Encounter (HOSPITAL_COMMUNITY): Payer: Self-pay | Admitting: Occupational Therapy

## 2014-03-27 ENCOUNTER — Ambulatory Visit (INDEPENDENT_AMBULATORY_CARE_PROVIDER_SITE_OTHER): Payer: PPO | Admitting: Psychiatry

## 2014-03-27 DIAGNOSIS — F1310 Sedative, hypnotic or anxiolytic abuse, uncomplicated: Secondary | ICD-10-CM | POA: Insufficient documentation

## 2014-03-27 DIAGNOSIS — Z79899 Other long term (current) drug therapy: Secondary | ICD-10-CM | POA: Insufficient documentation

## 2014-03-27 DIAGNOSIS — Z8781 Personal history of (healed) traumatic fracture: Secondary | ICD-10-CM | POA: Insufficient documentation

## 2014-03-27 DIAGNOSIS — F329 Major depressive disorder, single episode, unspecified: Secondary | ICD-10-CM | POA: Diagnosis not present

## 2014-03-27 DIAGNOSIS — F32A Depression, unspecified: Secondary | ICD-10-CM

## 2014-03-27 DIAGNOSIS — Z8739 Personal history of other diseases of the musculoskeletal system and connective tissue: Secondary | ICD-10-CM | POA: Insufficient documentation

## 2014-03-27 DIAGNOSIS — C799 Secondary malignant neoplasm of unspecified site: Secondary | ICD-10-CM

## 2014-03-27 DIAGNOSIS — E780 Pure hypercholesterolemia: Secondary | ICD-10-CM | POA: Insufficient documentation

## 2014-03-27 DIAGNOSIS — I10 Essential (primary) hypertension: Secondary | ICD-10-CM | POA: Diagnosis not present

## 2014-03-27 DIAGNOSIS — C7900 Secondary malignant neoplasm of unspecified kidney and renal pelvis: Secondary | ICD-10-CM | POA: Diagnosis not present

## 2014-03-27 DIAGNOSIS — E119 Type 2 diabetes mellitus without complications: Secondary | ICD-10-CM | POA: Insufficient documentation

## 2014-03-27 DIAGNOSIS — Z8669 Personal history of other diseases of the nervous system and sense organs: Secondary | ICD-10-CM | POA: Diagnosis not present

## 2014-03-27 DIAGNOSIS — K219 Gastro-esophageal reflux disease without esophagitis: Secondary | ICD-10-CM | POA: Diagnosis not present

## 2014-03-27 DIAGNOSIS — F418 Other specified anxiety disorders: Secondary | ICD-10-CM

## 2014-03-27 DIAGNOSIS — Z008 Encounter for other general examination: Secondary | ICD-10-CM | POA: Diagnosis present

## 2014-03-27 HISTORY — DX: Anxiety disorder, unspecified: F41.9

## 2014-03-27 LAB — RAPID URINE DRUG SCREEN, HOSP PERFORMED
Amphetamines: NOT DETECTED
BARBITURATES: NOT DETECTED
Benzodiazepines: POSITIVE — AB
Cocaine: NOT DETECTED
Opiates: NOT DETECTED
Tetrahydrocannabinol: NOT DETECTED

## 2014-03-27 LAB — URINALYSIS, ROUTINE W REFLEX MICROSCOPIC
Bilirubin Urine: NEGATIVE
Glucose, UA: NEGATIVE mg/dL
HGB URINE DIPSTICK: NEGATIVE
Ketones, ur: NEGATIVE mg/dL
Nitrite: NEGATIVE
Protein, ur: NEGATIVE mg/dL
SPECIFIC GRAVITY, URINE: 1.02 (ref 1.005–1.030)
UROBILINOGEN UA: 0.2 mg/dL (ref 0.0–1.0)
pH: 5.5 (ref 5.0–8.0)

## 2014-03-27 LAB — BASIC METABOLIC PANEL
ANION GAP: 8 (ref 5–15)
BUN: 31 mg/dL — ABNORMAL HIGH (ref 6–23)
CALCIUM: 9.2 mg/dL (ref 8.4–10.5)
CO2: 22 mmol/L (ref 19–32)
Chloride: 109 mmol/L (ref 96–112)
Creatinine, Ser: 1.53 mg/dL — ABNORMAL HIGH (ref 0.50–1.10)
GFR calc Af Amer: 40 mL/min — ABNORMAL LOW (ref >90)
GFR, EST NON AFRICAN AMERICAN: 35 mL/min — AB (ref >90)
Glucose, Bld: 139 mg/dL — ABNORMAL HIGH (ref 70–99)
POTASSIUM: 3.9 mmol/L (ref 3.5–5.1)
SODIUM: 139 mmol/L (ref 135–145)

## 2014-03-27 LAB — CBC WITH DIFFERENTIAL/PLATELET
BASOS ABS: 0 10*3/uL (ref 0.0–0.1)
BASOS PCT: 0 % (ref 0–1)
EOS ABS: 0.3 10*3/uL (ref 0.0–0.7)
Eosinophils Relative: 4 % (ref 0–5)
HCT: 33.5 % — ABNORMAL LOW (ref 36.0–46.0)
Hemoglobin: 10.7 g/dL — ABNORMAL LOW (ref 12.0–15.0)
Lymphocytes Relative: 23 % (ref 12–46)
Lymphs Abs: 1.4 10*3/uL (ref 0.7–4.0)
MCH: 31.8 pg (ref 26.0–34.0)
MCHC: 31.9 g/dL (ref 30.0–36.0)
MCV: 99.4 fL (ref 78.0–100.0)
MONOS PCT: 5 % (ref 3–12)
Monocytes Absolute: 0.3 10*3/uL (ref 0.1–1.0)
NEUTROS PCT: 68 % (ref 43–77)
Neutro Abs: 4.1 10*3/uL (ref 1.7–7.7)
Platelets: 163 10*3/uL (ref 150–400)
RBC: 3.37 MIL/uL — AB (ref 3.87–5.11)
RDW: 14.3 % (ref 11.5–15.5)
WBC: 6.1 10*3/uL (ref 4.0–10.5)

## 2014-03-27 LAB — URINE MICROSCOPIC-ADD ON

## 2014-03-27 LAB — ETHANOL: ALCOHOL ETHYL (B): 5 mg/dL (ref 0–9)
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
## 2014-03-27 NOTE — Discharge Instructions (Signed)
Follow-up with behavioral health counseling as recommended.  Return to the emergency department if your symptoms worsen or change.   Depression Depression refers to feeling sad, low, down in the dumps, blue, gloomy, or empty. In general, there are two kinds of depression: 1. Normal sadness or normal grief. This kind of depression is one that we all feel from time to time after upsetting life experiences, such as the loss of a job or the ending of a relationship. This kind of depression is considered normal, is short lived, and resolves within a few days to 2 weeks. Depression experienced after the loss of a loved one (bereavement) often lasts longer than 2 weeks but normally gets better with time. 2. Clinical depression. This kind of depression lasts longer than normal sadness or normal grief or interferes with your ability to function at home, at work, and in school. It also interferes with your personal relationships. It affects almost every aspect of your life. Clinical depression is an illness. Symptoms of depression can also be caused by conditions other than those mentioned above, such as:  Physical illness. Some physical illnesses, including underactive thyroid gland (hypothyroidism), severe anemia, specific types of cancer, diabetes, uncontrolled seizures, heart and lung problems, strokes, and chronic pain are commonly associated with symptoms of depression.  Side effects of some prescription medicine. In some people, certain types of medicine can cause symptoms of depression.  Substance abuse. Abuse of alcohol and illicit drugs can cause symptoms of depression. SYMPTOMS Symptoms of normal sadness and normal grief include the following:  Feeling sad or crying for short periods of time.  Not caring about anything (apathy).  Difficulty sleeping or sleeping too much.  No longer able to enjoy the things you used to enjoy.  Desire to be by oneself all the time (social  isolation).  Lack of energy or motivation.  Difficulty concentrating or remembering.  Change in appetite or weight.  Restlessness or agitation. Symptoms of clinical depression include the same symptoms of normal sadness or normal grief and also the following symptoms:  Feeling sad or crying all the time.  Feelings of guilt or worthlessness.  Feelings of hopelessness or helplessness.  Thoughts of suicide or the desire to harm yourself (suicidal ideation).  Loss of touch with reality (psychotic symptoms). Seeing or hearing things that are not real (hallucinations) or having false beliefs about your life or the people around you (delusions and paranoia). DIAGNOSIS  The diagnosis of clinical depression is usually based on how bad the symptoms are and how long they have lasted. Your health care provider will also ask you questions about your medical history and substance use to find out if physical illness, use of prescription medicine, or substance abuse is causing your depression. Your health care provider may also order blood tests. TREATMENT  Often, normal sadness and normal grief do not require treatment. However, sometimes antidepressant medicine is given for bereavement to ease the depressive symptoms until they resolve. The treatment for clinical depression depends on how bad the symptoms are but often includes antidepressant medicine, counseling with a mental health professional, or both. Your health care provider will help to determine what treatment is best for you. Depression caused by physical illness usually goes away with appropriate medical treatment of the illness. If prescription medicine is causing depression, talk with your health care provider about stopping the medicine, decreasing the dose, or changing to another medicine. Depression caused by the abuse of alcohol or illicit drugs goes away when  you stop using these substances. Some adults need professional help in order  to stop drinking or using drugs. SEEK IMMEDIATE MEDICAL CARE IF:  You have thoughts about hurting yourself or others.  You lose touch with reality (have psychotic symptoms).  You are taking medicine for depression and have a serious side effect. FOR MORE INFORMATION  National Alliance on Mental Illness: www.nami.[employer] of Mental Health: https://carter.com/ Document Released: 12/27/1999 Document Revised: 05/15/2013 Document Reviewed: 03/30/2011 [facility] Patient Information 2015 [place], [place]. This information is not intended to replace advice given to you by your health care provider. Make sure you discuss any questions you have with your health care provider.    Emergency Department Resource Guide 1) Find a Doctor and Pay Out of Pocket Although you won't have to find out who is covered by your insurance plan, it is a good idea to ask around and get recommendations. You will then need to call the office and see if the doctor you have chosen will accept you as a new patient and what types of options they offer for patients who are self-pay. Some doctors offer discounts or will set up payment plans for their patients who do not have insurance, but you will need to ask so you aren't surprised when you get to your appointment.  2) Contact Your Local Health Department Not all health departments have doctors that can see patients for sick visits, but many do, so it is worth a call to see if yours does. If you don't know where your local health department is, you can check in your phone book. The CDC also has a tool to help you locate your state's health department, and many state websites also have listings of all of their local health departments.  3) Find a [facility] If your illness is not likely to be very severe or complicated, you may want to try a walk in clinic. These are popping up all over the country in pharmacies, drugstores, and shopping centers. They're usually  staffed by nurse practitioners or physician assistants that have been trained to treat common illnesses and complaints. They're usually fairly quick and inexpensive. However, if you have serious medical issues or chronic medical problems, these are probably not your best option.  No Primary Care Doctor: - Call Health Connect at  [phone] - they can help you locate a primary care doctor that  accepts your insurance, provides certain services, etc. - Physician Referral Service- [phone]  Chronic Pain Problems: Organization         Address  Phone   Notes  [facility]  [phone] Patients need to be referred by their primary care doctor.   Medication Assistance: Organization         Address  Phone   Notes  [facility] Medication [facility] [address] [place], [place] [address] [phone] --Must be a resident of [facility] -- Must have NO insurance coverage whatsoever (no Medicaid/ Medicare, etc.) -- The pt. MUST have a primary care doctor that directs their care regularly and follows them in the community   MedAssist  [phone]   [employer]  [phone]    Agencies that provide inexpensive medical care: Organization         Address  Phone   Notes  [place]  [phone]   [facility]    [phone]   [facility]  [place], [place] [address] [phone]   [place] [address] [place] [phone]   Planned Parenthood    [phone]   [facility]    [phone]   [place] [address], [place] Phone:  [phone], Fax:  [phone] Hours of Operation:  9 am - 6 pm, M-F.  Also accepts Medicaid/Medicare and self-pay.  [facility] for [place] [address], [address], [place] Phone: [phone], Fax: [phone]. Hours of  Operation:  8:30 am - 5:30 pm, M-F.  Also accepts Medicaid and self-pay.  [facility] [place] [address] [place] Phone: [phone]   [place], [place], [place] [phone], Ext. [phone] Mondays & Thursdays: 7-9 AM.  First 15 patients are seen on a first come, first serve basis.    [place] Providers:  Organization         Address  Phone   Notes  [facility] [address], [place] [phone] Also accepts self-pay patients.  [facility] [address], [place]  [phone]   [place], [address], [place] [phone]   [facility] [address], [place] [phone]   Lucianne Lei [address], [place]   [phone] Only accepts [place] Access [place] patients after they have their name applied to their card.   Self-Pay (no insurance) in [facility]:  Organization         Address  Phone   Notes  Sickle Cell Patients, [facility] [place] [phone]   [facility] Urgent Care [place] [phone]   [facility] [place]  [place], [place],  [phone]   [facility]/Dr. Osei-Bonsu  [address] [place] or [address], [place] [phone] Phone number for both [place] and [place] locations is the same.  Urgent Medical and [facility] [address], [place] [phone]   [facility] [address] [place] or [address] [phone]   [facility] [address] [place] [phone], phone; [phone], fax Sees patients 1st and 3rd Saturday of every month.  Must not qualify for public or private insurance (i.e. Medicaid, Medicare,  [place] Health Choice, Veterans' Benefits)  Household income should be no more than 200% of the poverty level The clinic cannot treat you if you are pregnant or think you are pregnant  Sexually transmitted diseases are not treated at the clinic.    Dental Care: Organization         Address  Phone  Notes  [facility] Department of [facility] [phone] Accepts children up to age 62 who are enrolled in [place] or [place]; pregnant women with a Medicaid card; and children who have applied for Medicaid or  Health Choice, but were declined, whose parents can pay a reduced fee at time of service.  [facility] Department of [facility]  [address], [place] [phone] Accepts children up to age 82 who are enrolled in [place]  or [place] Health Choice; pregnant women with a Medicaid card; and children who have applied for Medicaid or [place] Health Choice, but were declined, whose parents can pay a reduced fee at time of service.  [place] Adult Dental Access PROGRAM  [place] [phone] Patients are seen by appointment only. Walk-ins are not accepted. [place] will see patients 20 years of age and older. Monday - Tuesday (8am-5pm) Most Wednesdays (8:30-5pm) $30 per visit, cash only  [facility] Adult Dental Access PROGRAM  [address], [facility] [phone] Patients are seen by appointment only. Walk-ins are not accepted. [place] will see patients 34 years of age and older. One Wednesday Evening (Monthly: Volunteer Based).  $30 per visit, cash only  [place]  [phone] for adults; Children under age 62, call Graduate Pediatric Dentistry at [phone]. Children aged 65-14, please call [phone] to request a pediatric application.  Dental services are provided in all areas of dental care including fillings, crowns and bridges,  complete and partial dentures, implants, gum treatment, root canals, and extractions. Preventive care is also provided. Treatment is provided to both adults and children. Patients are selected via a lottery and there is often a waiting list.   [facility] [address], [place]  [phone] [email or web address]   [facility] [address] [place], [place] [phone], Ext. [phone] Second and Fourth Thursday of each month, opens at 6:30 AM; Clinic ends at 9 AM.  Patients are seen on a first-come first-served basis, and a limited number are seen during each clinic.   [facility]  [address] Hillard Danker [place], [place] [phone]   Eligibility Requirements You must have lived in [place], [place], or [place] counties for at least the last three months.   You cannot be eligible for state or federal sponsored [employer], including [employer], [place], or [employer].   You generally cannot be eligible for healthcare insurance through your employer.    How to apply: Eligibility screenings are held every Tuesday and Wednesday afternoon from 1:00 pm until 4:00 pm. You do not need an appointment for the interview!  [facility] [address] [place], [place]   [place]  [place] Department  [place]  [phone]    Behavioral Health Resources in the Community: Intensive Outpatient Programs Organization         Address  Phone  Notes  [place]. [address], [place], [place] [phone]   [facility] Outpatient [address] [place], [place]   ADS: Alcohol & Drug Svcs [address] [place], [place]   [place] [address]  [place], [place] or [phone]   Substance Abuse Resources Organization          Address  Phone  Notes  Alcohol and Drug Services  [phone]   [place]  [phone]   [facility] [place]   Chinita Pester  [phone]   Residential & Outpatient Substance Abuse Program  [phone]   Psychological Services Organization         Address  Phone  Notes  [facility] [place]  [place]  [phone]   [place] [address], [place] or [phone]  Mobile Crisis Teams Organization         Address  Phone  Notes  [facility], Mobile Crisis Care Unit  [phone]   [facility]  [address] [place], [place]   [place] Levels [address], [place] [phone]    Self-Help/Support Groups Organization         Address  Phone             Notes  [facility]. of [place] - variety of support groups  [place] Call for more information  Narcotics Anonymous (NA), [facility] [address], [employer] [place]  2 meetings at this location   Special educational needs teacher         Address  Phone  Notes  ASAP Residential Treatment [place],    [place]  [phone]   [facility]  [address] [place] [id number], [place], [place]   [place], [place] [phone] Admissions: 8am-3pm M-F  Incentives Substance [place] [address]    [place], [place] [phone]   [facility] [address] [place], [place], [place]   [facility] [address]  [place], [place]   [facility] - Intensive Outpatient [address], [place], [place]   [facility] ([place].) [place].,  [place], [place] [phone] or [phone]   [facility]idential Treatment Services (RTS) [address], [place], [place] Accepts Medicaid  [facility] [place] [address],  [place] [phone] Substance Abuse/Addiction Treatment   [facility] Organization         Address  Phone  Notes  [facility]  [phone]   Domenic Schwab, PhD [address] [place], [place]   [phone] or [phone]   [place], [place] [phone]   [facility] [address], [place], [place] [phone] Insurance/Medicaid/sponsorship through [facility] and Families [address] [place]                                    [place], [place] [phone] [place] [address], [place] [phone]    Dr. Adele Schilder  [phone]   [facility] Dept. 1) [address], [place] 2) [place] 3)  [address], [place] [phone]  [phone]   [place] [phone] or [phone] (After Hours)

## 2014-03-27 NOTE — ED Notes (Signed)
June 13th, 2013, dx with renal carcinoma.  Having depression and crying.  Having thoughts of killing her self.  No thoughts of hurting others.
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
## 2014-03-28 NOTE — Patient Instructions (Signed)
Discussed orally 

## 2014-03-28 NOTE — Progress Notes (Signed)
       THERAPIST PROGRESS NOTE  Session Time: Friday 03/23/2014  1:35 PM - 2:30 PM  Participation Level: Active  Behavioral Response: CasualAlert/ anxious, depressed,tearful  Type of Therapy: Individual Therapy  Treatment Goals addressed: Improve ability to manage stress and transitions with decreased irritability and feelings of being overwhelmed  Interventions: CBT and Supportive  Summary: [name] [name] is a 64 y.o. female who presents with symptoms of depression and anxiety that have been intermittent for several years that have been well controlled until patient was diagnosed with cancer in 2013. Symptoms have worsened in recent months as patient reports constantly thinking about her diagnoses and fears cancer will spread. She has been given a prognosis of 2-4 years to live. Patient reports taking chemotherapy pills and reports extreme fatigue. Patient also experiences depression, anxiety, and crying spells  Patient reports seeing oncologist in [facility] yesterday and bing informed medication has been effective in shrinking the nodules in her lung. Patient reports being happy initially but then becoming upset, worried, and depressed as she does not know how long this medication will be effective for her and states it is always in the back of her mind that she is dying with cancer. She expresses regret and frustration about the family finances and guilt about asking her sister to be responsible for patient's final arrangements upon her death.  She reports anxiety,rumination depressed mood, and uncontrollable crying spells. She initially shares with therapist she has not had any suicidal ideations last session. However she remains very depressed and is crying during session. She then states she doesn't know if she can take it anymore.  Psychiatrist Dr. Harrington Challenger attends part of the session to discuss possible hospitalization. Patient then shares she has had suicidal thoughts of taking a bottle of  pills more than once.    Suicidal/Homicidal:  Patient admits suicidal ideations. Therapist and psychiatrist Dr. Harrington Challenger discuss safety concerns with patient. She agrees she will go to [facility] ER for assessment for hospitalization. Therapist works with patient and husband to facilitate referral to ER for assessment.   Therapist Response: Therapist works with patient and husband to discuss safety concerns and referral to [facility] ER for assessment for hospitalization. Therapist calls and talks with charge nurse Tiffany and refers patient for assessment. Therapist informs nurse patient is experiencing depression and suicidal ideations and will be escorted to ER by her husband.     Plan: Patient and husband agree to go to [facility] ER to have patient assessed for hospitalization.    Diagnosis: Axis I: Depressive Disorder NOS    Axis II: Deferred    BYNUM,PEGGY, LCSW 03/28/2014

## 2014-03-29 ENCOUNTER — Encounter (HOSPITAL_COMMUNITY): Payer: Self-pay

## 2014-04-02 ENCOUNTER — Other Ambulatory Visit (HOSPITAL_COMMUNITY): Payer: Self-pay | Admitting: Oncology

## 2014-04-02 ENCOUNTER — Encounter (HOSPITAL_COMMUNITY): Payer: Self-pay | Admitting: Hematology & Oncology

## 2014-04-02 ENCOUNTER — Encounter (HOSPITAL_BASED_OUTPATIENT_CLINIC_OR_DEPARTMENT_OTHER): Payer: PPO | Admitting: Hematology & Oncology

## 2014-04-02 ENCOUNTER — Encounter (HOSPITAL_BASED_OUTPATIENT_CLINIC_OR_DEPARTMENT_OTHER): Payer: PPO

## 2014-04-02 VITALS — BP 117/49 | HR 57 | Temp 97.6°F | Resp 20 | Wt 178.6 lb

## 2014-04-02 DIAGNOSIS — C649 Malignant neoplasm of unspecified kidney, except renal pelvis: Secondary | ICD-10-CM

## 2014-04-02 DIAGNOSIS — F329 Major depressive disorder, single episode, unspecified: Secondary | ICD-10-CM

## 2014-04-02 DIAGNOSIS — Z5112 Encounter for antineoplastic immunotherapy: Secondary | ICD-10-CM

## 2014-04-02 DIAGNOSIS — F32A Depression, unspecified: Secondary | ICD-10-CM

## 2014-04-02 DIAGNOSIS — E876 Hypokalemia: Secondary | ICD-10-CM

## 2014-04-02 LAB — COMPREHENSIVE METABOLIC PANEL
ALBUMIN: 3.8 g/dL (ref 3.5–5.2)
ALT: 12 U/L (ref 0–35)
AST: 21 U/L (ref 0–37)
Alkaline Phosphatase: 55 U/L (ref 39–117)
Anion gap: 11 (ref 5–15)
BUN: 17 mg/dL (ref 6–23)
CALCIUM: 9.8 mg/dL (ref 8.4–10.5)
CO2: 22 mmol/L (ref 19–32)
Chloride: 106 mmol/L (ref 96–112)
Creatinine, Ser: 1.29 mg/dL — ABNORMAL HIGH (ref 0.50–1.10)
GFR calc Af Amer: 50 mL/min — ABNORMAL LOW (ref >90)
GFR calc non Af Amer: 43 mL/min — ABNORMAL LOW (ref >90)
Glucose, Bld: 162 mg/dL — ABNORMAL HIGH (ref 70–99)
Potassium: 3.3 mmol/L — ABNORMAL LOW (ref 3.5–5.1)
SODIUM: 139 mmol/L (ref 135–145)
Total Bilirubin: 0.6 mg/dL (ref 0.3–1.2)
Total Protein: 7 g/dL (ref 6.0–8.3)

## 2014-04-02 LAB — TSH: TSH: 8.723 u[IU]/mL — ABNORMAL HIGH (ref 0.350–4.500)

## 2014-04-02 LAB — CBC WITH DIFFERENTIAL/PLATELET
BASOS ABS: 0 10*3/uL (ref 0.0–0.1)
Basophils Relative: 0 % (ref 0–1)
Eosinophils Absolute: 0.2 10*3/uL (ref 0.0–0.7)
Eosinophils Relative: 3 % (ref 0–5)
HEMATOCRIT: 35.5 % — AB (ref 36.0–46.0)
HEMOGLOBIN: 11.6 g/dL — AB (ref 12.0–15.0)
Lymphocytes Relative: 16 % (ref 12–46)
Lymphs Abs: 1.3 10*3/uL (ref 0.7–4.0)
MCH: 32.1 pg (ref 26.0–34.0)
MCHC: 32.7 g/dL (ref 30.0–36.0)
MCV: 98.3 fL (ref 78.0–100.0)
MONO ABS: 0.3 10*3/uL (ref 0.1–1.0)
Monocytes Relative: 4 % (ref 3–12)
Neutro Abs: 6.1 10*3/uL (ref 1.7–7.7)
Neutrophils Relative %: 77 % (ref 43–77)
Platelets: 205 10*3/uL (ref 150–400)
RBC: 3.61 MIL/uL — ABNORMAL LOW (ref 3.87–5.11)
RDW: 14.1 % (ref 11.5–15.5)
WBC: 7.9 10*3/uL (ref 4.0–10.5)

## 2014-04-02 MED ORDER — SODIUM CHLORIDE 0.9 % IV SOLN
Freq: Once | INTRAVENOUS | Status: AC
  Administered 2014-04-02: 11:00:00 via INTRAVENOUS

## 2014-04-02 MED ORDER — SODIUM CHLORIDE 0.9 % IV SOLN
3.0000 mg/kg | Freq: Once | INTRAVENOUS | Status: AC
  Administered 2014-04-02: 240 mg via INTRAVENOUS
  Filled 2014-04-02: qty 20

## 2014-04-02 MED ORDER — SODIUM CHLORIDE 0.9 % IJ SOLN: 10.0000 mL | INTRAMUSCULAR | Status: DC | PRN

## 2014-04-02 MED ORDER — HEPARIN SOD (PORK) LOCK FLUSH 100 UNIT/ML IV SOLN
500.0000 [IU] | Freq: Once | INTRAVENOUS | Status: AC | PRN
  Administered 2014-04-02: 500 [IU]
  Filled 2014-04-02: qty 5

## 2014-04-02 MED ORDER — POTASSIUM CHLORIDE CRYS ER 20 MEQ PO TBCR
20.0000 meq | EXTENDED_RELEASE_TABLET | Freq: Two times a day (BID) | ORAL | Status: DC
Start: 2014-04-02 — End: 2014-04-30

## 2014-04-02 NOTE — Patient Instructions (Signed)
[facility] at [facility] Discharge Instructions  RECOMMENDATIONS MADE BY THE CONSULTANT AND ANY TEST RESULTS WILL BE SENT TO YOUR REFERRING PHYSICIAN.  Exam and discussion by Dr. Whitney Muse Keep your appointment with your psychiatrist  Try to get out more and do things. Call with any questions or concerns. Follow-up in 2 weeks.  Thank you for choosing [facility] at [facility] to provide your oncology and hematology care.  To afford each patient quality time with our provider, please arrive at least 15 minutes before your scheduled appointment time.    You need to re-schedule your appointment should you arrive 10 or more minutes late.  We strive to give you quality time with our providers, and arriving late affects you and other patients whose appointments are after yours.  Also, if you no show three or more times for appointments you may be dismissed from the clinic at the providers discretion.     Again, thank you for choosing [facility].  Our hope is that these requests will decrease the amount of time that you wait before being seen by our physicians.       _____________________________________________________________  Should you have questions after your visit to [facility], please contact our office at [phone] between the hours of 8:30 a.m. and 4:30 p.m.  Voicemails left after 4:30 p.m. will not be returned until the following business day.  For prescription refill requests, have your pharmacy contact our office.

## 2014-04-02 NOTE — Patient Instructions (Addendum)
[facility] Discharge Instructions for Patients Receiving Chemotherapy  Today you received the following chemotherapy agents: Opdivo  To help prevent nausea and vomiting after your treatment, we encourage you to take your nausea medication: Zofran 8mg  tablet. May take 1 tablet every 8 hours as needed for nausea/vomiting   If you develop nausea and vomiting, or diarrhea that is not controlled by your medication, call the clinic.  The clinic phone number is [phone]. Office hours are Monday-Friday 8:30am-5:00pm.  BELOW ARE SYMPTOMS THAT SHOULD BE REPORTED IMMEDIATELY:  *FEVER GREATER THAN 101.0 F  *CHILLS WITH OR WITHOUT FEVER  NAUSEA AND VOMITING THAT IS NOT CONTROLLED WITH YOUR NAUSEA MEDICATION  *UNUSUAL SHORTNESS OF BREATH  *UNUSUAL BRUISING OR BLEEDING  TENDERNESS IN MOUTH AND THROAT WITH OR WITHOUT PRESENCE OF ULCERS  *URINARY PROBLEMS  *BOWEL PROBLEMS  UNUSUAL RASH Items with * indicate a potential emergency and should be followed up as soon as possible. If you have an emergency after office hours please contact your primary care physician or go to the nearest emergency department.  Please call the clinic during office hours if you have any questions or concerns.   You may also contact the Patient Navigator at [phone] should you have any questions or need assistance in obtaining follow up care. _____________________________________________________________________ Have you asked about our STAR program?    STAR stands for Survivorship Training and Rehabilitation, and this is a nationally recognized cancer care program that focuses on survivorship and rehabilitation.  Cancer and cancer treatments may cause problems, such as, pain, making you feel tired and keeping you from doing the things that you need or want to do. Cancer rehabilitation can help. Our goal is to reduce these troubling effects and help you have the best quality of life  possible.  You may receive a survey from a nurse that asks questions about your current state of health.  Based on the survey results, all eligible patients will be referred to the [facility] program for an evaluation so we can better serve you! A frequently asked questions sheet is available upon request.
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
## 2014-04-02 NOTE — Progress Notes (Signed)
Tolerated Opdivo without any problems.

## 2014-04-03 ENCOUNTER — Ambulatory Visit (INDEPENDENT_AMBULATORY_CARE_PROVIDER_SITE_OTHER): Payer: PPO | Admitting: Internal Medicine

## 2014-04-03 ENCOUNTER — Encounter (INDEPENDENT_AMBULATORY_CARE_PROVIDER_SITE_OTHER): Payer: Self-pay | Admitting: *Deleted

## 2014-04-03 ENCOUNTER — Other Ambulatory Visit (INDEPENDENT_AMBULATORY_CARE_PROVIDER_SITE_OTHER): Payer: Self-pay | Admitting: *Deleted

## 2014-04-03 ENCOUNTER — Encounter (INDEPENDENT_AMBULATORY_CARE_PROVIDER_SITE_OTHER): Payer: Self-pay | Admitting: Internal Medicine

## 2014-04-03 ENCOUNTER — Encounter (HOSPITAL_COMMUNITY): Payer: Self-pay | Admitting: Occupational Therapy

## 2014-04-03 VITALS — BP 102/60 | HR 60 | Temp 97.9°F | Ht 66.0 in | Wt 177.0 lb

## 2014-04-03 DIAGNOSIS — K219 Gastro-esophageal reflux disease without esophagitis: Secondary | ICD-10-CM

## 2014-04-03 DIAGNOSIS — R110 Nausea: Secondary | ICD-10-CM
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
## 2014-04-03 NOTE — Patient Instructions (Signed)
egd

## 2014-04-04 ENCOUNTER — Ambulatory Visit (INDEPENDENT_AMBULATORY_CARE_PROVIDER_SITE_OTHER): Payer: PPO | Admitting: Psychiatry

## 2014-04-04 ENCOUNTER — Encounter (HOSPITAL_COMMUNITY): Payer: Self-pay | Admitting: Psychiatry

## 2014-04-04 VITALS — BP 110/69 | HR 68 | Ht 66.0 in | Wt 177.4 lb

## 2014-04-04 DIAGNOSIS — F329 Major depressive disorder, single episode, unspecified: Secondary | ICD-10-CM | POA: Diagnosis not present

## 2014-04-04 DIAGNOSIS — F32A Depression, unspecified: Secondary | ICD-10-CM
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
## 2014-04-05 ENCOUNTER — Encounter (HOSPITAL_COMMUNITY): Payer: Self-pay

## 2014-04-09 ENCOUNTER — Ambulatory Visit (HOSPITAL_COMMUNITY): Payer: Self-pay | Admitting: Psychiatry

## 2014-04-10 ENCOUNTER — Encounter (HOSPITAL_COMMUNITY): Payer: Self-pay | Admitting: Occupational Therapy

## 2014-04-12 ENCOUNTER — Encounter (HOSPITAL_COMMUNITY): Payer: Self-pay

## 2014-04-16 ENCOUNTER — Encounter (HOSPITAL_COMMUNITY): Payer: Self-pay

## 2014-04-16 ENCOUNTER — Encounter (HOSPITAL_COMMUNITY): Payer: PPO | Attending: Hematology and Oncology

## 2014-04-16 ENCOUNTER — Encounter (HOSPITAL_BASED_OUTPATIENT_CLINIC_OR_DEPARTMENT_OTHER): Payer: PPO | Admitting: Oncology

## 2014-04-16 DIAGNOSIS — R5383 Other fatigue: Secondary | ICD-10-CM | POA: Diagnosis present

## 2014-04-16 DIAGNOSIS — C649 Malignant neoplasm of unspecified kidney, except renal pelvis: Secondary | ICD-10-CM

## 2014-04-16 DIAGNOSIS — C7800 Secondary malignant neoplasm of unspecified lung: Secondary | ICD-10-CM | POA: Diagnosis not present

## 2014-04-16 DIAGNOSIS — R918 Other nonspecific abnormal finding of lung field: Secondary | ICD-10-CM | POA: Diagnosis present

## 2014-04-16 DIAGNOSIS — Z5112 Encounter for antineoplastic immunotherapy: Secondary | ICD-10-CM | POA: Diagnosis not present

## 2014-04-16 DIAGNOSIS — K219 Gastro-esophageal reflux disease without esophagitis: Secondary | ICD-10-CM | POA: Insufficient documentation

## 2014-04-16 DIAGNOSIS — E875 Hyperkalemia: Secondary | ICD-10-CM

## 2014-04-16 LAB — CBC WITH DIFFERENTIAL/PLATELET
Basophils Absolute: 0 10*3/uL (ref 0.0–0.1)
Basophils Relative: 0 % (ref 0–1)
Eosinophils Absolute: 0.2 10*3/uL (ref 0.0–0.7)
Eosinophils Relative: 4 % (ref 0–5)
HCT: 37.5 % (ref 36.0–46.0)
HEMOGLOBIN: 12 g/dL (ref 12.0–15.0)
LYMPHS ABS: 1.4 10*3/uL (ref 0.7–4.0)
LYMPHS PCT: 21 % (ref 12–46)
MCH: 32.2 pg (ref 26.0–34.0)
MCHC: 32 g/dL (ref 30.0–36.0)
MCV: 100.5 fL — AB (ref 78.0–100.0)
Monocytes Absolute: 0.4 10*3/uL (ref 0.1–1.0)
Monocytes Relative: 6 % (ref 3–12)
NEUTROS PCT: 69 % (ref 43–77)
Neutro Abs: 4.4 10*3/uL (ref 1.7–7.7)
Platelets: 190 10*3/uL (ref 150–400)
RBC: 3.73 MIL/uL — ABNORMAL LOW (ref 3.87–5.11)
RDW: 14 % (ref 11.5–15.5)
WBC: 6.3 10*3/uL (ref 4.0–10.5)

## 2014-04-16 LAB — COMPREHENSIVE METABOLIC PANEL
ALBUMIN: 4.2 g/dL (ref 3.5–5.2)
ALK PHOS: 60 U/L (ref 39–117)
ALT: 11 U/L (ref 0–35)
AST: 19 U/L (ref 0–37)
Anion gap: 7 (ref 5–15)
BUN: 29 mg/dL — AB (ref 6–23)
CHLORIDE: 113 mmol/L — AB (ref 96–112)
CO2: 18 mmol/L — ABNORMAL LOW (ref 19–32)
Calcium: 9.7 mg/dL (ref 8.4–10.5)
Creatinine, Ser: 1.62 mg/dL — ABNORMAL HIGH (ref 0.50–1.10)
GFR, EST AFRICAN AMERICAN: 38 mL/min — AB (ref >90)
GFR, EST NON AFRICAN AMERICAN: 33 mL/min — AB (ref >90)
Glucose, Bld: 100 mg/dL — ABNORMAL HIGH (ref 70–99)
Potassium: 5.2 mmol/L — ABNORMAL HIGH (ref 3.5–5.1)
Sodium: 138 mmol/L (ref 135–145)
TOTAL PROTEIN: 7.3 g/dL (ref 6.0–8.3)
Total Bilirubin: 0.6 mg/dL (ref 0.3–1.2)

## 2014-04-16 LAB — TSH: TSH: 1.873 u[IU]/mL (ref 0.350–4.500)

## 2014-04-16 MED ORDER — SODIUM CHLORIDE 0.9 % IV SOLN
Freq: Once | INTRAVENOUS | Status: AC
  Administered 2014-04-16: 13:00:00 via INTRAVENOUS

## 2014-04-16 MED ORDER — SODIUM CHLORIDE 0.9 % IJ SOLN: 10.0000 mL | INTRAMUSCULAR | Status: DC | PRN

## 2014-04-16 MED ORDER — HEPARIN SOD (PORK) LOCK FLUSH 100 UNIT/ML IV SOLN
500.0000 [IU] | Freq: Once | INTRAVENOUS | Status: AC | PRN
  Administered 2014-04-16: 500 [IU]
  Filled 2014-04-16: qty 5

## 2014-04-16 MED ORDER — SODIUM CHLORIDE 0.9 % IV SOLN
3.0000 mg/kg | Freq: Once | INTRAVENOUS | Status: AC
  Administered 2014-04-16: 240 mg via INTRAVENOUS
  Filled 2014-04-16: qty 20

## 2014-04-16 NOTE — Patient Instructions (Signed)
[place] at [facility]  Discharge Instructions:  You saw [name] [name] today. Decrease Potassium to one time a day. Return in 4 weeks to see the doctor. Return as scheduled for chemotherapy.  Please call the clinic if you have any questions or concerns.  _______________________________________________________________  Thank you for choosing [place] at [facility] to provide your oncology and hematology care.  To afford each patient quality time with our providers, please arrive at least 15 minutes before your scheduled appointment.  You need to re-schedule your appointment if you arrive 10 or more minutes late.  We strive to give you quality time with our providers, and arriving late affects you and other patients whose appointments are after yours.  Also, if you no show three or more times for appointments you may be dismissed from the clinic.  Again, thank you for choosing [place] [facility] hope is that these requests will allow you access to exceptional care and in a timely manner. _______________________________________________________________  If you have questions after your visit, please contact our office at [phone] between the hours of 8:30 a.m. and 5:00 p.m. Voicemails left after 4:30 p.m. will not be returned until the following business day. _______________________________________________________________  For prescription refill requests, have your pharmacy contact our office. _______________________________________________________________  Recommendations made by the consultant and any test results will be sent to your referring physician. _______________________________________________________________
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
## 2014-04-16 NOTE — Assessment & Plan Note (Addendum)
Pleasant 64 year old female with stage IV renal cell carcinoma. She is currently on single agent Nivolumab with excellent tolerance. She had restaging studies at [facility] on 03/26/2014 demonstrating stable to improved disease.   Pre-chemo labs as ordered.  Oncology history is updated.  Return in 4 weeks for follow-up with continuation of planned therapy.  Next appointment at [facility] is in May 2016.  Hyperkalemia is noted.  I have decreased her Kdur to 20 Meq daily (compared to 40 mEq daily).

## 2014-04-16 NOTE — Patient Instructions (Signed)
[facility] Discharge Instructions for Patients Receiving Chemotherapy  Today you received the following chemotherapy agents:  Opdivo  If you develop nausea and vomiting, or diarrhea that is not controlled by your medication, call the clinic.  The clinic phone number is [phone]. Office hours are Monday-Friday 8:30am-5:00pm.  BELOW ARE SYMPTOMS THAT SHOULD BE REPORTED IMMEDIATELY:  *FEVER GREATER THAN 101.0 F  *CHILLS WITH OR WITHOUT FEVER  NAUSEA AND VOMITING THAT IS NOT CONTROLLED WITH YOUR NAUSEA MEDICATION  *UNUSUAL SHORTNESS OF BREATH  *UNUSUAL BRUISING OR BLEEDING  TENDERNESS IN MOUTH AND THROAT WITH OR WITHOUT PRESENCE OF ULCERS  *URINARY PROBLEMS  *BOWEL PROBLEMS  UNUSUAL RASH Items with * indicate a potential emergency and should be followed up as soon as possible. If you have an emergency after office hours please contact your primary care physician or go to the nearest emergency department.  Please call the clinic during office hours if you have any questions or concerns.   You may also contact the Patient Navigator at [phone] should you have any questions or need assistance in obtaining follow up care. _____________________________________________________________________ Have you asked about our STAR program?    STAR stands for Survivorship Training and Rehabilitation, and this is a nationally recognized cancer care program that focuses on survivorship and rehabilitation.  Cancer and cancer treatments may cause problems, such as, pain, making you feel tired and keeping you from doing the things that you need or want to do. Cancer rehabilitation can help. Our goal is to reduce these troubling effects and help you have the best quality of life possible.  You may receive a survey from a nurse that asks questions about your current state of health.  Based on the survey results, all eligible patients will be referred to the [facility] program for an  evaluation so we can better serve you! A frequently asked questions sheet is available upon request.

## 2014-04-19 ENCOUNTER — Encounter: Payer: Self-pay | Admitting: Physician Assistant

## 2014-04-19 ENCOUNTER — Ambulatory Visit (INDEPENDENT_AMBULATORY_CARE_PROVIDER_SITE_OTHER): Payer: PPO | Admitting: Physician Assistant

## 2014-04-19 VITALS — BP 112/76 | HR 64 | Temp 97.8°F | Resp 18 | Wt 178.0 lb

## 2014-04-19 DIAGNOSIS — I10 Essential (primary) hypertension: Secondary | ICD-10-CM

## 2014-04-19 DIAGNOSIS — C649 Malignant neoplasm of unspecified kidney, except renal pelvis: Secondary | ICD-10-CM | POA: Diagnosis not present

## 2014-04-19 DIAGNOSIS — F329 Major depressive disorder, single episode, unspecified: Secondary | ICD-10-CM | POA: Diagnosis not present

## 2014-04-19 DIAGNOSIS — E119 Type 2 diabetes mellitus without complications: Secondary | ICD-10-CM | POA: Diagnosis not present

## 2014-04-19 DIAGNOSIS — E785 Hyperlipidemia, unspecified: Secondary | ICD-10-CM

## 2014-04-19 DIAGNOSIS — F32A Depression, unspecified: Secondary | ICD-10-CM

## 2014-04-19 LAB — HEMOGLOBIN A1C, FINGERSTICK: HEMOGLOBIN A1C, FINGERSTICK: 4.8 % (ref <5.7)
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
## 2014-04-20 ENCOUNTER — Ambulatory Visit (INDEPENDENT_AMBULATORY_CARE_PROVIDER_SITE_OTHER): Payer: PPO | Admitting: Psychiatry

## 2014-04-20 DIAGNOSIS — F329 Major depressive disorder, single episode, unspecified: Secondary | ICD-10-CM | POA: Diagnosis not present

## 2014-04-20 DIAGNOSIS — F32A Depression, unspecified: Secondary | ICD-10-CM

## 2014-04-20 NOTE — Progress Notes (Signed)
       THERAPIST PROGRESS NOTE  Session Time: Friday 04/20/2014 9:07 AM - 10:03 AM  Participation Level: Active  Behavioral Response: CasualAlert/ anxious, depressed,tearful  Type of Therapy: Individual Therapy  Treatment Goals addressed: Improve ability to manage stress and transitions with decreased irritability and feelings of being overwhelmed  Interventions: ACT, Supportive  Summary: [name] [name] is a 64 y.o. female who presents with symptoms of depression and anxiety that have been intermittent for several years that have been well controlled until patient was diagnosed with cancer in 2013. Symptoms have worsened in recent months as patient reports constantly thinking about her diagnoses and fears cancer will spread. She has been given a prognosis of 2-4 years to live. Patient reports taking chemotherapy pills and reports extreme fatigue. Patient also experiences depression, anxiety, and crying spells  Patient reports she was evaluated by telespsych  at [facility] after last session. She says it was recommended she continue to seek outpatient treatment as she did not need hospitalization. Patient denies having any suicidal ideations since that time. However, she remains very depressed, anxious, and continues to experience frequent crying spells. She reports her learning yesterday she will not be able to leave land to her children as she had promised. She expresses fear children will be disappointed and withdraw from her. She also worries that children are not independent now and expresses fear about their future.  Suicidal/Homicidal: No    Therapist Response: Therapist works with patient to process feelings, identify ways she is relating to her thoughts and the effects on her relationship with her family, identify her values (connection) and actions she could pursue to facilitate this with her family.   Plan: Patient agrees to return for an appointment in 2 weeks    Diagnosis: Axis I:  Depressive Disorder NOS    Axis II: Deferred    Kanishk Stroebel, LCSW 04/20/2014

## 2014-04-20 NOTE — Patient Instructions (Signed)
Discussed orally 

## 2014-04-24 ENCOUNTER — Other Ambulatory Visit (HOSPITAL_COMMUNITY): Payer: Self-pay | Admitting: Oncology

## 2014-04-24 DIAGNOSIS — C649 Malignant neoplasm of unspecified kidney, except renal pelvis: Secondary | ICD-10-CM

## 2014-04-24 MED ORDER — OXYCODONE HCL 5 MG PO TABS: 5.0000 mg | ORAL_TABLET | Freq: Four times a day (QID) | ORAL | Status: DC | PRN

## 2014-04-26 ENCOUNTER — Encounter (HOSPITAL_COMMUNITY)
Admission: RE | Disposition: A | Discharge status: Home / Self Care | Payer: Self-pay | Source: Ambulatory Visit | Attending: Internal Medicine

## 2014-04-26 ENCOUNTER — Encounter (HOSPITAL_COMMUNITY): Payer: Self-pay | Admitting: *Deleted

## 2014-04-26 ENCOUNTER — Ambulatory Visit (HOSPITAL_COMMUNITY)
Admission: RE | Admit: 2014-04-26 | Discharge: 2014-04-26 | Disposition: A | Discharge status: Home / Self Care | Payer: PPO | Source: Ambulatory Visit | Attending: Internal Medicine | Admitting: Internal Medicine

## 2014-04-26 DIAGNOSIS — K219 Gastro-esophageal reflux disease without esophagitis: Secondary | ICD-10-CM

## 2014-04-26 DIAGNOSIS — M1990 Unspecified osteoarthritis, unspecified site: Secondary | ICD-10-CM | POA: Diagnosis not present

## 2014-04-26 DIAGNOSIS — Z888 Allergy status to other drugs, medicaments and biological substances status: Secondary | ICD-10-CM | POA: Diagnosis not present

## 2014-04-26 DIAGNOSIS — K2970 Gastritis, unspecified, without bleeding: Secondary | ICD-10-CM | POA: Diagnosis not present

## 2014-04-26 DIAGNOSIS — G4730 Sleep apnea, unspecified: Secondary | ICD-10-CM | POA: Diagnosis not present

## 2014-04-26 DIAGNOSIS — C649 Malignant neoplasm of unspecified kidney, except renal pelvis: Secondary | ICD-10-CM | POA: Insufficient documentation

## 2014-04-26 DIAGNOSIS — E119 Type 2 diabetes mellitus without complications: Secondary | ICD-10-CM | POA: Insufficient documentation

## 2014-04-26 DIAGNOSIS — Z9049 Acquired absence of other specified parts of digestive tract: Secondary | ICD-10-CM | POA: Insufficient documentation

## 2014-04-26 DIAGNOSIS — F329 Major depressive disorder, single episode, unspecified: Secondary | ICD-10-CM | POA: Diagnosis not present

## 2014-04-26 DIAGNOSIS — R110 Nausea: Secondary | ICD-10-CM | POA: Diagnosis not present

## 2014-04-26 DIAGNOSIS — K760 Fatty (change of) liver, not elsewhere classified: Secondary | ICD-10-CM | POA: Insufficient documentation

## 2014-04-26 DIAGNOSIS — Z90710 Acquired absence of both cervix and uterus: Secondary | ICD-10-CM | POA: Insufficient documentation

## 2014-04-26 DIAGNOSIS — I10 Essential (primary) hypertension: Secondary | ICD-10-CM | POA: Diagnosis not present

## 2014-04-26 DIAGNOSIS — B159 Hepatitis A without hepatic coma: Secondary | ICD-10-CM | POA: Insufficient documentation

## 2014-04-26 DIAGNOSIS — Z905 Acquired absence of kidney: Secondary | ICD-10-CM | POA: Insufficient documentation

## 2014-04-26 DIAGNOSIS — F419 Anxiety disorder, unspecified: Secondary | ICD-10-CM | POA: Diagnosis not present

## 2014-04-26 HISTORY — PX: ESOPHAGOGASTRODUODENOSCOPY: SHX5428

## 2014-04-26 LAB — GLUCOSE, CAPILLARY: GLUCOSE-CAPILLARY: 115 mg/dL — AB (ref 70–99)

## 2014-04-26 SURGERY — EGD (ESOPHAGOGASTRODUODENOSCOPY)
Anesthesia: Moderate Sedation

## 2014-04-26 MED ORDER — MEPERIDINE HCL 50 MG/ML IJ SOLN
INTRAMUSCULAR | Status: DC | PRN
  Administered 2014-04-26 (×2): 25 mg via INTRAVENOUS

## 2014-04-26 MED ORDER — MIDAZOLAM HCL 5 MG/5ML IJ SOLN
INTRAMUSCULAR | Status: AC
  Filled 2014-04-26: qty 10

## 2014-04-26 MED ORDER — BUTAMBEN-TETRACAINE-BENZOCAINE 2-2-14 % EX AERO
INHALATION_SPRAY | CUTANEOUS | Status: DC | PRN
  Administered 2014-04-26: 2 via TOPICAL

## 2014-04-26 MED ORDER — MIDAZOLAM HCL 5 MG/5ML IJ SOLN
INTRAMUSCULAR | Status: DC | PRN
  Administered 2014-04-26 (×5): 2 mg via INTRAVENOUS

## 2014-04-26 MED ORDER — STERILE WATER FOR IRRIGATION IR SOLN
Status: DC | PRN
  Administered 2014-04-26: 15:00:00

## 2014-04-26 MED ORDER — SODIUM CHLORIDE 0.9 % IV SOLN
INTRAVENOUS | Status: DC
  Administered 2014-04-26: 1000 mL via INTRAVENOUS

## 2014-04-26 MED ORDER — MEPERIDINE HCL 50 MG/ML IJ SOLN
INTRAMUSCULAR | Status: AC
  Filled 2014-04-26: qty 1
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
## 2014-04-26 NOTE — Discharge Instructions (Signed)
Resume usual medications and diet. No driving for 24 hours. Physician will call with biopsy results.   Esophagogastroduodenoscopy Care After Refer to this sheet in the next few weeks. These instructions provide you with information on caring for yourself after your procedure. Your caregiver may also give you more specific instructions. Your treatment has been planned according to current medical practices, but problems sometimes occur. Call your caregiver if you have any problems or questions after your procedure.  HOME CARE INSTRUCTIONS  Do not eat or drink anything until the numbing medicine (local anesthetic) has worn off and your gag reflex has returned. You will know that the local anesthetic has worn off when you can swallow comfortably.  Do not drive for 12 hours after the procedure or as directed by your caregiver.  Only take medicines as directed by your caregiver. SEEK MEDICAL CARE IF:   You cannot stop coughing.  You are not urinating at all or less than usual. SEEK IMMEDIATE MEDICAL CARE IF:  You have difficulty swallowing.  You cannot eat or drink.  You have worsening throat or chest pain.  You have dizziness, lightheadedness, or you faint.  You have nausea or vomiting.  You have chills.  You have a fever.  You have severe abdominal pain.  You have black, tarry, or bloody stools.   Gastritis, Adult Gastritis is soreness and puffiness (inflammation) of the lining of the stomach. If you do not get help, gastritis can cause bleeding and sores (ulcers) in the stomach. HOME CARE   Only take medicine as told by your doctor.  If you were given antibiotic medicines, take them as told. Finish the medicines even if you start to feel better.  Drink enough fluids to keep your pee (urine) clear or pale yellow.  Avoid foods and drinks that make your problems worse. Foods you may want to avoid include:  Caffeine or alcohol.  Chocolate.  Mint.  Garlic and  onions.  Spicy foods.  Citrus fruits, including oranges, lemons, or limes.  Food containing tomatoes, including sauce, chili, salsa, and pizza.  Fried and fatty foods.  Eat small meals throughout the day instead of large meals. GET HELP RIGHT AWAY IF:   You have black or dark red poop (stools).  You throw up (vomit) blood. It may look like coffee grounds.  You cannot keep fluids down.  Your belly (abdominal) pain gets worse.  You have a fever.  You do not feel better after 1 week.  You have any other questions or concerns. MAKE SURE YOU:   Understand these instructions.  Will watch your condition.  Will get help right away if you are not doing well or get worse. Document Released: 06/17/2007 Document Revised: 03/23/2011 Document Reviewed: 02/11/2011 [facility] Patient Information 2015 [place], [place]. This information is not intended to replace advice given to you by your health care provider. Make sure you discuss any questions you have with your health care provider.

## 2014-04-26 NOTE — Op Note (Signed)
EGD PROCEDURE REPORT  PATIENT:  [name] [name]  MR#:  [id number] Birthdate:  10-Sep-1950, 64 y.o., female Endoscopist:  Dr. Rogene Houston, MD Referred By:  Dr. Molli Hazard, MD  Procedure Date: 04/26/2014  Procedure:   EGD  Indications: Patient is 64 year old Caucasian female with multiple medical problems including metastatic renal cell carcinoma who is now maintained on Nivolumab who presents with recurrent nausea. She has chronic GERD and she says heartburns well controlled with medication. She is undergoing diagnostic EGD.            Informed Consent:  The risks, benefits, alternatives & imponderables which include, but are not limited to, bleeding, infection, perforation, drug reaction and potential missed lesion have been reviewed.  The potential for biopsy, lesion removal, esophageal dilation, etc. have also been discussed.  Questions have been answered.  All parties agreeable.  Please see history & physical in medical record for more information.  Medications:  Demerol 50 mg IV Versed 10 mg IV Cetacaine spray topically for oropharyngeal anesthesia  Description of procedure:  The endoscope was introduced through the mouth and advanced to the second portion of the duodenum without difficulty or limitations. The mucosal surfaces were surveyed very carefully during advancement of the scope and upon withdrawal.  Findings:  Esophagus:  Mucosa of the esophagus was normal. Wavy GE junction but no ring or stricture noted. GEJ:  39 cm Stomach:  Stomach was empty and distended very well with insufflation. There was no food debris in the stomach. Small amount of bile present. Folds in the proximal stomach were normal. Examination of mucosa at gastric body revealed edema and erythema and specks of blood primarily along lesser curvature and posterior wall. No erosions or ulcers noted. Pyloric channel was patent. Angularis fundus and cardia were unremarkable. Duodenum:  Normal bulbar and  post bulbar mucosa.  Therapeutic/Diagnostic Maneuvers Performed:  Biopsies taken from gastric mucosa for routine histology.  Complications:  None  Impression: No evidence of erosive esophagitis. Gastritis primarily involving gastric body. Biopsy taken to rule out H. pylori infection. This abnormality could be secondary to potassium or infection but would not explain her nausea.  Comment; Pattern of her nausea would suggest depression may be one of the causes.  Recommendations:  Standard instructions given. I will be contacting patient with biopsy results and further recommendations.  Aleisha Paone U  04/26/2014  3:41 PM  CC: Dr. Jenna Luo TOM, MD & Dr. Rayne Du ref. provider found CC Dr. Molli Hazard, MD

## 2014-04-27 ENCOUNTER — Encounter (HOSPITAL_COMMUNITY): Payer: Self-pay | Admitting: Internal Medicine

## 2014-04-30 ENCOUNTER — Other Ambulatory Visit (HOSPITAL_COMMUNITY): Payer: Self-pay | Admitting: Psychiatry

## 2014-04-30 ENCOUNTER — Other Ambulatory Visit (HOSPITAL_COMMUNITY): Payer: Self-pay | Admitting: Oncology

## 2014-04-30 ENCOUNTER — Encounter (HOSPITAL_BASED_OUTPATIENT_CLINIC_OR_DEPARTMENT_OTHER): Payer: PPO

## 2014-04-30 VITALS — BP 111/48 | HR 58 | Temp 97.7°F | Resp 18 | Wt 179.0 lb

## 2014-04-30 DIAGNOSIS — E875 Hyperkalemia: Secondary | ICD-10-CM

## 2014-04-30 DIAGNOSIS — Z5112 Encounter for antineoplastic immunotherapy: Secondary | ICD-10-CM | POA: Diagnosis not present

## 2014-04-30 DIAGNOSIS — C649 Malignant neoplasm of unspecified kidney, except renal pelvis: Secondary | ICD-10-CM

## 2014-04-30 DIAGNOSIS — C7800 Secondary malignant neoplasm of unspecified lung: Secondary | ICD-10-CM | POA: Diagnosis not present

## 2014-04-30 LAB — COMPREHENSIVE METABOLIC PANEL
ALT: 12 U/L (ref 0–35)
ANION GAP: 6 (ref 5–15)
AST: 16 U/L (ref 0–37)
Albumin: 3.8 g/dL (ref 3.5–5.2)
Alkaline Phosphatase: 50 U/L (ref 39–117)
BUN: 35 mg/dL — ABNORMAL HIGH (ref 6–23)
CHLORIDE: 114 mmol/L — AB (ref 96–112)
CO2: 19 mmol/L (ref 19–32)
Calcium: 9.4 mg/dL (ref 8.4–10.5)
Creatinine, Ser: 1.5 mg/dL — ABNORMAL HIGH (ref 0.50–1.10)
GFR calc non Af Amer: 36 mL/min — ABNORMAL LOW (ref >90)
GFR, EST AFRICAN AMERICAN: 41 mL/min — AB (ref >90)
Glucose, Bld: 91 mg/dL (ref 70–99)
POTASSIUM: 5.4 mmol/L — AB (ref 3.5–5.1)
SODIUM: 139 mmol/L (ref 135–145)
Total Bilirubin: 0.4 mg/dL (ref 0.3–1.2)
Total Protein: 6.9 g/dL (ref 6.0–8.3)

## 2014-04-30 LAB — CBC WITH DIFFERENTIAL/PLATELET
BASOS PCT: 0 % (ref 0–1)
Basophils Absolute: 0 10*3/uL (ref 0.0–0.1)
Eosinophils Absolute: 0.3 10*3/uL (ref 0.0–0.7)
Eosinophils Relative: 4 % (ref 0–5)
HCT: 34.9 % — ABNORMAL LOW (ref 36.0–46.0)
HEMOGLOBIN: 11.6 g/dL — AB (ref 12.0–15.0)
Lymphocytes Relative: 19 % (ref 12–46)
Lymphs Abs: 1.6 10*3/uL (ref 0.7–4.0)
MCH: 32.2 pg (ref 26.0–34.0)
MCHC: 33.2 g/dL (ref 30.0–36.0)
MCV: 96.9 fL (ref 78.0–100.0)
MONOS PCT: 6 % (ref 3–12)
Monocytes Absolute: 0.5 10*3/uL (ref 0.1–1.0)
Neutro Abs: 5.9 10*3/uL (ref 1.7–7.7)
Neutrophils Relative %: 71 % (ref 43–77)
Platelets: 155 10*3/uL (ref 150–400)
RBC: 3.6 MIL/uL — ABNORMAL LOW (ref 3.87–5.11)
RDW: 14 % (ref 11.5–15.5)
WBC: 8.3 10*3/uL (ref 4.0–10.5)

## 2014-04-30 LAB — TSH: TSH: 1.705 u[IU]/mL (ref 0.350–4.500)

## 2014-04-30 MED ORDER — SODIUM POLYSTYRENE SULFONATE 15 GM/60ML PO SUSP
120.0000 g | Freq: Once | ORAL | Status: DC
  Filled 2014-04-30: qty 480

## 2014-04-30 MED ORDER — SODIUM POLYSTYRENE SULFONATE PO POWD: Freq: Once | ORAL | Status: DC

## 2014-04-30 MED ORDER — SODIUM CHLORIDE 0.9 % IV SOLN
Freq: Once | INTRAVENOUS | Status: AC
  Administered 2014-04-30: 14:00:00 via INTRAVENOUS

## 2014-04-30 MED ORDER — SODIUM CHLORIDE 0.9 % IV SOLN
3.0000 mg/kg | Freq: Once | INTRAVENOUS | Status: AC
  Administered 2014-04-30: 240 mg via INTRAVENOUS
  Filled 2014-04-30: qty 20

## 2014-04-30 MED ORDER — SODIUM POLYSTYRENE SULFONATE 15 GM/60ML PO SUSP
15.0000 g | Freq: Once | ORAL | Status: AC
  Administered 2014-04-30: 15 g via ORAL
  Filled 2014-04-30: qty 60

## 2014-04-30 MED ORDER — HEPARIN SOD (PORK) LOCK FLUSH 100 UNIT/ML IV SOLN
500.0000 [IU] | Freq: Once | INTRAVENOUS | Status: AC | PRN
  Administered 2014-04-30: 500 [IU]

## 2014-04-30 MED ORDER — LORAZEPAM 2 MG/ML IJ SOLN
0.2500 mg | Freq: Once | INTRAMUSCULAR | Status: AC
  Administered 2014-04-30: 0.25 mg via INTRAVENOUS
  Filled 2014-04-30: qty 1

## 2014-04-30 NOTE — Patient Instructions (Addendum)
[facility] Discharge Instructions for Patients Receiving Chemotherapy   Today you received the following chemotherapy agents: Opdivo  We also gave you Ativan for your nausea prior to you receiving Opdivo.   We gave you Kayexelate for your high potassium level. Please stop taking your Potassium tablets @ home.   Continue taking your Carafate.   The clinic phone number is [phone]. Office hours are Monday-Friday 8:30am-5:00pm.  BELOW ARE SYMPTOMS THAT SHOULD BE REPORTED IMMEDIATELY:  *FEVER GREATER THAN 101.0 F  *CHILLS WITH OR WITHOUT FEVER  NAUSEA AND VOMITING THAT IS NOT CONTROLLED WITH YOUR NAUSEA MEDICATION  *UNUSUAL SHORTNESS OF BREATH  *UNUSUAL BRUISING OR BLEEDING  TENDERNESS IN MOUTH AND THROAT WITH OR WITHOUT PRESENCE OF ULCERS  *URINARY PROBLEMS  *BOWEL PROBLEMS  UNUSUAL RASH Items with * indicate a potential emergency and should be followed up as soon as possible. If you have an emergency after office hours please contact your primary care physician or go to the nearest emergency department.  Please call the clinic during office hours if you have any questions or concerns.   You may also contact the Patient Navigator at [phone] should you have any questions or need assistance in obtaining follow up care. _____________________________________________________________________ Have you asked about our STAR program?    STAR stands for Survivorship Training and Rehabilitation, and this is a nationally recognized cancer care program that focuses on survivorship and rehabilitation.  Cancer and cancer treatments may cause problems, such as, pain, making you feel tired and keeping you from doing the things that you need or want to do. Cancer rehabilitation can help. Our goal is to reduce these troubling effects and help you have the best quality of life possible.  You may receive a survey from a nurse that asks questions about your current state  of health.  Based on the survey results, all eligible patients will be referred to the [facility] program for an evaluation so we can better serve you! A frequently asked questions sheet is available upon request.

## 2014-04-30 NOTE — Progress Notes (Signed)
[name] [name] Tolerated chemotherapy well today. Discharged via wheelchair

## 2014-05-02 ENCOUNTER — Ambulatory Visit (INDEPENDENT_AMBULATORY_CARE_PROVIDER_SITE_OTHER): Payer: PPO | Admitting: Psychiatry

## 2014-05-02 ENCOUNTER — Encounter (HOSPITAL_COMMUNITY): Payer: Self-pay | Admitting: Psychiatry

## 2014-05-02 VITALS — BP 110/70 | Ht 66.0 in | Wt 177.4 lb

## 2014-05-02 DIAGNOSIS — F329 Major depressive disorder, single episode, unspecified: Secondary | ICD-10-CM

## 2014-05-02 DIAGNOSIS — F32A Depression, unspecified: Secondary | ICD-10-CM

## 2014-05-02 MED ORDER — DIAZEPAM 10 MG PO TABS: 10.0000 mg | ORAL_TABLET | Freq: Four times a day (QID) | ORAL | Status: DC

## 2014-05-02 MED ORDER — ESCITALOPRAM OXALATE 20 MG PO TABS: 20.0000 mg | ORAL_TABLET | Freq: Two times a day (BID) | ORAL | Status: DC
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
## 2014-05-14 ENCOUNTER — Encounter (HOSPITAL_COMMUNITY): Payer: Self-pay | Admitting: Hematology & Oncology

## 2014-05-14 ENCOUNTER — Encounter (HOSPITAL_COMMUNITY): Payer: PPO | Attending: Hematology and Oncology | Admitting: Hematology & Oncology

## 2014-05-14 ENCOUNTER — Inpatient Hospital Stay (HOSPITAL_COMMUNITY): Payer: Self-pay

## 2014-05-14 ENCOUNTER — Encounter (HOSPITAL_BASED_OUTPATIENT_CLINIC_OR_DEPARTMENT_OTHER): Payer: PPO

## 2014-05-14 VITALS — BP 117/65 | HR 67 | Temp 97.6°F | Resp 16 | Wt 182.2 lb

## 2014-05-14 VITALS — BP 131/66 | HR 61 | Temp 97.6°F | Resp 16

## 2014-05-14 DIAGNOSIS — K279 Peptic ulcer, site unspecified, unspecified as acute or chronic, without hemorrhage or perforation: Secondary | ICD-10-CM

## 2014-05-14 DIAGNOSIS — C649 Malignant neoplasm of unspecified kidney, except renal pelvis: Secondary | ICD-10-CM | POA: Diagnosis not present

## 2014-05-14 DIAGNOSIS — Z5112 Encounter for antineoplastic immunotherapy: Secondary | ICD-10-CM

## 2014-05-14 DIAGNOSIS — C7802 Secondary malignant neoplasm of left lung: Secondary | ICD-10-CM

## 2014-05-14 DIAGNOSIS — C7801 Secondary malignant neoplasm of right lung: Secondary | ICD-10-CM

## 2014-05-14 DIAGNOSIS — R5383 Other fatigue: Secondary | ICD-10-CM | POA: Diagnosis present

## 2014-05-14 DIAGNOSIS — R918 Other nonspecific abnormal finding of lung field: Secondary | ICD-10-CM | POA: Diagnosis present

## 2014-05-14 DIAGNOSIS — K219 Gastro-esophageal reflux disease without esophagitis: Secondary | ICD-10-CM | POA: Diagnosis present

## 2014-05-14 LAB — CBC WITH DIFFERENTIAL/PLATELET
BASOS PCT: 0 % (ref 0–1)
Basophils Absolute: 0 10*3/uL (ref 0.0–0.1)
EOS ABS: 0.2 10*3/uL (ref 0.0–0.7)
Eosinophils Relative: 3 % (ref 0–5)
HCT: 35.9 % — ABNORMAL LOW (ref 36.0–46.0)
Hemoglobin: 11.7 g/dL — ABNORMAL LOW (ref 12.0–15.0)
LYMPHS ABS: 1.5 10*3/uL (ref 0.7–4.0)
Lymphocytes Relative: 21 % (ref 12–46)
MCH: 31.4 pg (ref 26.0–34.0)
MCHC: 32.6 g/dL (ref 30.0–36.0)
MCV: 96.2 fL (ref 78.0–100.0)
MONO ABS: 0.4 10*3/uL (ref 0.1–1.0)
Monocytes Relative: 5 % (ref 3–12)
NEUTROS PCT: 71 % (ref 43–77)
Neutro Abs: 5.2 10*3/uL (ref 1.7–7.7)
Platelets: 171 10*3/uL (ref 150–400)
RBC: 3.73 MIL/uL — ABNORMAL LOW (ref 3.87–5.11)
RDW: 14.2 % (ref 11.5–15.5)
WBC: 7.3 10*3/uL (ref 4.0–10.5)

## 2014-05-14 LAB — COMPREHENSIVE METABOLIC PANEL
ALT: 11 U/L — ABNORMAL LOW (ref 14–54)
AST: 17 U/L (ref 15–41)
Albumin: 3.7 g/dL (ref 3.5–5.0)
Alkaline Phosphatase: 54 U/L (ref 38–126)
Anion gap: 6 (ref 5–15)
BILIRUBIN TOTAL: 0.5 mg/dL (ref 0.3–1.2)
BUN: 25 mg/dL — ABNORMAL HIGH (ref 6–20)
CO2: 25 mmol/L (ref 22–32)
Calcium: 9.4 mg/dL (ref 8.9–10.3)
Chloride: 109 mmol/L (ref 101–111)
Creatinine, Ser: 1.16 mg/dL — ABNORMAL HIGH (ref 0.44–1.00)
GFR, EST AFRICAN AMERICAN: 56 mL/min — AB (ref >60)
GFR, EST NON AFRICAN AMERICAN: 49 mL/min — AB (ref >60)
Glucose, Bld: 116 mg/dL — ABNORMAL HIGH (ref 70–99)
Potassium: 3.8 mmol/L (ref 3.5–5.1)
Sodium: 140 mmol/L (ref 135–145)
Total Protein: 6.9 g/dL (ref 6.5–8.1)

## 2014-05-14 LAB — TSH: TSH: 2.235 u[IU]/mL (ref 0.350–4.500)

## 2014-05-14 MED ORDER — NIVOLUMAB CHEMO INJECTION 100 MG/10ML
3.0000 mg/kg | Freq: Once | INTRAVENOUS | Status: AC
  Administered 2014-05-14: 240 mg via INTRAVENOUS
  Filled 2014-05-14: qty 20

## 2014-05-14 MED ORDER — SUCRALFATE 1 GM/10ML PO SUSP: 1.0000 g | Freq: Three times a day (TID) | ORAL | Status: DC

## 2014-05-14 MED ORDER — SODIUM CHLORIDE 0.9 % IV SOLN
Freq: Once | INTRAVENOUS | Status: AC
  Administered 2014-05-14: 11:00:00 via INTRAVENOUS

## 2014-05-14 MED ORDER — HEPARIN SOD (PORK) LOCK FLUSH 100 UNIT/ML IV SOLN
500.0000 [IU] | Freq: Once | INTRAVENOUS | Status: AC | PRN
  Administered 2014-05-14: 500 [IU]

## 2014-05-14 MED ORDER — SODIUM CHLORIDE 0.9 % IJ SOLN: 10.0000 mL | INTRAMUSCULAR | Status: DC | PRN

## 2014-05-14 NOTE — Progress Notes (Signed)
Denette Nicholaus Bloom Tolerated chemotherapy well today Discharged ambulatory

## 2014-05-14 NOTE — Patient Instructions (Signed)
[facility] Discharge Instructions for Patients Receiving Chemotherapy   Today you received the following chemotherapy agents opdivo Follow up as scheduled Please call the clinic if you have any questions or concerns  To help prevent nausea and vomiting after your treatment, we encourage you to take your nausea medication If you develop nausea and vomiting, or diarrhea that is not controlled by your medication, call the clinic.  The clinic phone number is [phone]. Office hours are Monday-Friday 8:30am-5:00pm.  BELOW ARE SYMPTOMS THAT SHOULD BE REPORTED IMMEDIATELY:  *FEVER GREATER THAN 101.0 F  *CHILLS WITH OR WITHOUT FEVER  NAUSEA AND VOMITING THAT IS NOT CONTROLLED WITH YOUR NAUSEA MEDICATION  *UNUSUAL SHORTNESS OF BREATH  *UNUSUAL BRUISING OR BLEEDING  TENDERNESS IN MOUTH AND THROAT WITH OR WITHOUT PRESENCE OF ULCERS  *URINARY PROBLEMS  *BOWEL PROBLEMS  UNUSUAL RASH Items with * indicate a potential emergency and should be followed up as soon as possible. If you have an emergency after office hours please contact your primary care physician or go to the nearest emergency department.  Please call the clinic during office hours if you have any questions or concerns.   You may also contact the Patient Navigator at [phone] should you have any questions or need assistance in obtaining follow up care. _____________________________________________________________________ Have you asked about our STAR program?    STAR stands for Survivorship Training and Rehabilitation, and this is a nationally recognized cancer care program that focuses on survivorship and rehabilitation.  Cancer and cancer treatments may cause problems, such as, pain, making you feel tired and keeping you from doing the things that you need or want to do. Cancer rehabilitation can help. Our goal is to reduce these troubling effects and help you have the best quality of life  possible.  You may receive a survey from a nurse that asks questions about your current state of health.  Based on the survey results, all eligible patients will be referred to the [facility] program for an evaluation so we can better serve you! A frequently asked questions sheet is available upon request.

## 2014-05-14 NOTE — Patient Instructions (Signed)
..  [place] at [facility] Discharge Instructions  RECOMMENDATIONS MADE BY THE CONSULTANT AND ANY TEST RESULTS WILL BE SENT TO YOUR REFERRING PHYSICIAN.  Exam and discussion with Dr. Whitney Muse. Continue with treatment as scheduled. Follow up appointment in 1 month. Call for any new or worsening symptoms, questions, or concerns.   Thank you for choosing [place] at [facility] to provide your oncology and hematology care.  To afford each patient quality time with our provider, please arrive at least 15 minutes before your scheduled appointment time.    You need to re-schedule your appointment should you arrive 10 or more minutes late.  We strive to give you quality time with our providers, and arriving late affects you and other patients whose appointments are after yours.  Also, if you no show three or more times for appointments you may be dismissed from the clinic at the providers discretion.     Again, thank you for choosing [facility].  Our hope is that these requests will decrease the amount of time that you wait before being seen by our physicians.       _____________________________________________________________  Should you have questions after your visit to [facility], please contact our office at [phone] between the hours of 8:30 a.m. and 4:30 p.m.  Voicemails left after 4:30 p.m. will not be returned until the following business day.  For prescription refill requests, have your pharmacy contact our office.
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
## 2014-05-15 ENCOUNTER — Ambulatory Visit (INDEPENDENT_AMBULATORY_CARE_PROVIDER_SITE_OTHER): Payer: PPO | Admitting: Psychiatry

## 2014-05-15 ENCOUNTER — Other Ambulatory Visit (HOSPITAL_COMMUNITY): Payer: Self-pay | Admitting: Oncology

## 2014-05-15 DIAGNOSIS — E039 Hypothyroidism, unspecified: Secondary | ICD-10-CM

## 2014-05-15 DIAGNOSIS — F32A Depression, unspecified: Secondary | ICD-10-CM

## 2014-05-15 DIAGNOSIS — F329 Major depressive disorder, single episode, unspecified: Secondary | ICD-10-CM

## 2014-05-15 MED ORDER — LEVOTHYROXINE SODIUM 50 MCG PO TABS: 75.0000 ug | ORAL_TABLET | Freq: Every day | ORAL | Status: DC

## 2014-05-15 NOTE — Progress Notes (Signed)
        THERAPIST PROGRESS NOTE  Session Time: Tuesday 05/15/2014 1:10 PM - 2:05 PM  Participation Level: Active  Behavioral Response: CasualAlert/ anxious, depressed,tearful  Type of Therapy: Individual Therapy  Treatment Goals addressed: Improve ability to manage stress and transitions with decreased irritability and feelings of being overwhelmed  Interventions: ACT, Supportive  Summary: [name] [name] is a 64 y.o. female who presents with symptoms of depression and anxiety that have been intermittent for several years that have been well controlled until patient was diagnosed with cancer in 2013. Symptoms have worsened in recent months as patient reports constantly thinking about her diagnoses and fears cancer will spread. She has been given a prognosis of 2-4 years to live. Patient reports taking chemotherapy pills and reports extreme fatigue. Patient also experiences depression, anxiety, and crying spells  Patient reports continued daily crying spells. She states her family is getting tired of her crying. She reports feeling overwhelmed and having passive suicidal ideations with no intent and no plan last week. She denies current suicidal ideations but reports feelings of hopelessness. She continues to worry about her children's future and family finances. She also reports increased anxiety having terminal cancer as she is scheduled for follow-up medical appointment in [facility] next week. She fears the nodules in her lungs have grown. She fears dying and is afraid she will go to hell. She expresses guilt about past and states not feeling worthy enough to read the bible or go to heaven. Patient continues to attend church and talk with a pastor. She also is worried about her granddaughter's father who recently moved in with patient and her husband but has a drinking problem.   Suicidal/Homicidal: No    Therapist Response: Therapist works with patient to process feelings about death and  to facilitate willingness to accept feelings as part of being human, identify ways she is relating to her thoughts and the effects on her relationship with her family, identify her values (connection) and actions she could pursue to facilitate this with her family,   Plan: Patient agrees to return for an appointment in 3-4 weeks    Diagnosis: Axis I: Depressive Disorder NOS    Axis II: Deferred    Kavion Mancinas, LCSW 05/15/2014

## 2014-05-15 NOTE — Patient Instructions (Signed)
Discussed orally 

## 2014-05-23 ENCOUNTER — Encounter (HOSPITAL_COMMUNITY): Payer: Self-pay | Admitting: Psychiatry

## 2014-05-23 ENCOUNTER — Ambulatory Visit (INDEPENDENT_AMBULATORY_CARE_PROVIDER_SITE_OTHER): Payer: PPO | Admitting: Psychiatry

## 2014-05-23 VITALS — BP 103/63 | HR 68 | Ht 66.0 in | Wt 177.6 lb

## 2014-05-23 DIAGNOSIS — F329 Major depressive disorder, single episode, unspecified: Secondary | ICD-10-CM

## 2014-05-23 DIAGNOSIS — F32A Depression, unspecified: Secondary | ICD-10-CM
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
## 2014-05-23 NOTE — Patient Instructions (Signed)
Stop lexapro start Vybriid samples

## 2014-05-25 ENCOUNTER — Telehealth (HOSPITAL_COMMUNITY): Payer: Self-pay

## 2014-05-25 ENCOUNTER — Telehealth (HOSPITAL_COMMUNITY): Payer: Self-pay | Admitting: *Deleted

## 2014-05-25 NOTE — Telephone Encounter (Signed)
Pt called asking if she can take her Zantac along with all her other medications. Pt number is [phone].

## 2014-05-25 NOTE — Telephone Encounter (Signed)
Pt called asking if she can take her Zantac along with all her other medications. Pt number is [phone].

## 2014-05-25 NOTE — Telephone Encounter (Signed)
[email or web address]

## 2014-05-25 NOTE — Telephone Encounter (Signed)
Call back to patient regarding Viibyrd use and message left.  Per Dr. Whitney Muse it's ok for her to take it.  To call back with any questions.

## 2014-05-28 ENCOUNTER — Encounter (HOSPITAL_BASED_OUTPATIENT_CLINIC_OR_DEPARTMENT_OTHER): Payer: PPO

## 2014-05-28 ENCOUNTER — Other Ambulatory Visit (HOSPITAL_COMMUNITY): Payer: Self-pay | Admitting: Oncology

## 2014-05-28 VITALS — BP 114/49 | HR 66 | Temp 97.8°F | Resp 18 | Wt 179.8 lb

## 2014-05-28 DIAGNOSIS — E039 Hypothyroidism, unspecified: Secondary | ICD-10-CM

## 2014-05-28 DIAGNOSIS — Z5112 Encounter for antineoplastic immunotherapy: Secondary | ICD-10-CM

## 2014-05-28 DIAGNOSIS — C649 Malignant neoplasm of unspecified kidney, except renal pelvis: Secondary | ICD-10-CM | POA: Diagnosis not present

## 2014-05-28 DIAGNOSIS — C7801 Secondary malignant neoplasm of right lung: Secondary | ICD-10-CM

## 2014-05-28 LAB — CBC WITH DIFFERENTIAL/PLATELET
BASOS PCT: 0 % (ref 0–1)
Basophils Absolute: 0 10*3/uL (ref 0.0–0.1)
EOS ABS: 0.2 10*3/uL (ref 0.0–0.7)
EOS PCT: 3 % (ref 0–5)
HEMATOCRIT: 36.2 % (ref 36.0–46.0)
Hemoglobin: 12 g/dL (ref 12.0–15.0)
Lymphocytes Relative: 20 % (ref 12–46)
Lymphs Abs: 1.3 10*3/uL (ref 0.7–4.0)
MCH: 31.2 pg (ref 26.0–34.0)
MCHC: 33.1 g/dL (ref 30.0–36.0)
MCV: 94 fL (ref 78.0–100.0)
MONOS PCT: 5 % (ref 3–12)
Monocytes Absolute: 0.3 10*3/uL (ref 0.1–1.0)
NEUTROS ABS: 4.9 10*3/uL (ref 1.7–7.7)
Neutrophils Relative %: 72 % (ref 43–77)
PLATELETS: 185 10*3/uL (ref 150–400)
RBC: 3.85 MIL/uL — AB (ref 3.87–5.11)
RDW: 14 % (ref 11.5–15.5)
WBC: 6.8 10*3/uL (ref 4.0–10.5)

## 2014-05-28 LAB — COMPREHENSIVE METABOLIC PANEL
ALBUMIN: 3.9 g/dL (ref 3.5–5.0)
ALT: 13 U/L — AB (ref 14–54)
AST: 19 U/L (ref 15–41)
Alkaline Phosphatase: 66 U/L (ref 38–126)
Anion gap: 9 (ref 5–15)
BUN: 33 mg/dL — ABNORMAL HIGH (ref 6–20)
CALCIUM: 9.5 mg/dL (ref 8.9–10.3)
CHLORIDE: 108 mmol/L (ref 101–111)
CO2: 23 mmol/L (ref 22–32)
Creatinine, Ser: 1.46 mg/dL — ABNORMAL HIGH (ref 0.44–1.00)
GFR, EST AFRICAN AMERICAN: 43 mL/min — AB (ref >60)
GFR, EST NON AFRICAN AMERICAN: 37 mL/min — AB (ref >60)
Glucose, Bld: 129 mg/dL — ABNORMAL HIGH (ref 65–99)
Potassium: 3.9 mmol/L (ref 3.5–5.1)
Sodium: 140 mmol/L (ref 135–145)
TOTAL PROTEIN: 7.1 g/dL (ref 6.5–8.1)
Total Bilirubin: 0.5 mg/dL (ref 0.3–1.2)

## 2014-05-28 LAB — TSH: TSH: 0.294 u[IU]/mL — AB (ref 0.350–4.500)

## 2014-05-28 MED ORDER — SODIUM CHLORIDE 0.9 % IJ SOLN
10.0000 mL | INTRAMUSCULAR | Status: DC | PRN
  Administered 2014-05-28: 10 mL
  Filled 2014-05-28: qty 10

## 2014-05-28 MED ORDER — SODIUM CHLORIDE 0.9 % IV SOLN
3.0000 mg/kg | Freq: Once | INTRAVENOUS | Status: AC
  Administered 2014-05-28: 240 mg via INTRAVENOUS
  Filled 2014-05-28: qty 20

## 2014-05-28 MED ORDER — SODIUM CHLORIDE 0.9 % IV SOLN
Freq: Once | INTRAVENOUS | Status: AC
  Administered 2014-05-28: 13:00:00 via INTRAVENOUS

## 2014-05-28 MED ORDER — HEPARIN SOD (PORK) LOCK FLUSH 100 UNIT/ML IV SOLN
500.0000 [IU] | Freq: Once | INTRAVENOUS | Status: AC | PRN
  Administered 2014-05-28: 500 [IU]
  Filled 2014-05-28: qty 5

## 2014-05-28 MED ORDER — LEVOTHYROXINE SODIUM 50 MCG PO TABS: 50.0000 ug | ORAL_TABLET | Freq: Every day | ORAL | Status: DC

## 2014-05-28 NOTE — Telephone Encounter (Signed)
Yes she can, please inform her

## 2014-05-28 NOTE — Telephone Encounter (Signed)
Informed pt and pt showed understanding

## 2014-05-28 NOTE — Progress Notes (Signed)
Tolerated chemo well. 

## 2014-05-28 NOTE — Patient Instructions (Signed)
[facility] Discharge Instructions for Patients Receiving Chemotherapy  Today you received the following chemotherapy agents Opdivo.  If you develop nausea and vomiting that is not controlled by your nausea medication, call the clinic. If it is after clinic hours your family physician or the after hours number for the clinic or go to the Emergency Department. BELOW ARE SYMPTOMS THAT SHOULD BE REPORTED IMMEDIATELY:  *FEVER GREATER THAN 101.0 F  *CHILLS WITH OR WITHOUT FEVER  NAUSEA AND VOMITING THAT IS NOT CONTROLLED WITH YOUR NAUSEA MEDICATION  *UNUSUAL SHORTNESS OF BREATH  *UNUSUAL BRUISING OR BLEEDING  TENDERNESS IN MOUTH AND THROAT WITH OR WITHOUT PRESENCE OF ULCERS  *URINARY PROBLEMS  *BOWEL PROBLEMS  UNUSUAL RASH Items with * indicate a potential emergency and should be followed up as soon as possible.  Return as scheduled.  I have been informed and understand all the instructions given to me. I know to contact the clinic, my physician, or go to the Emergency Department if any problems should occur. I do not have any questions at this time, but understand that I may call the clinic during office hours or the Patient Navigator at [phone] should I have any questions or need assistance in obtaining follow up care.    __________________________________________  _____________  __________ Signature of Patient or Authorized Representative            Date                   Time    __________________________________________ Nurse's Signature

## 2014-06-05 ENCOUNTER — Emergency Department (HOSPITAL_COMMUNITY): Payer: PPO

## 2014-06-05 ENCOUNTER — Emergency Department (HOSPITAL_COMMUNITY)
Admission: EM | Admit: 2014-06-05 | Discharge: 2014-06-05 | Disposition: A | Discharge status: Home / Self Care | Payer: PPO | Attending: Emergency Medicine | Admitting: Emergency Medicine

## 2014-06-05 ENCOUNTER — Encounter (HOSPITAL_COMMUNITY): Payer: Self-pay | Admitting: *Deleted

## 2014-06-05 DIAGNOSIS — K219 Gastro-esophageal reflux disease without esophagitis: Secondary | ICD-10-CM | POA: Diagnosis not present

## 2014-06-05 DIAGNOSIS — Y92410 Unspecified street and highway as the place of occurrence of the external cause: Secondary | ICD-10-CM | POA: Diagnosis not present

## 2014-06-05 DIAGNOSIS — Z8619 Personal history of other infectious and parasitic diseases: Secondary | ICD-10-CM | POA: Insufficient documentation

## 2014-06-05 DIAGNOSIS — T148XXA Other injury of unspecified body region, initial encounter: Secondary | ICD-10-CM

## 2014-06-05 DIAGNOSIS — F419 Anxiety disorder, unspecified: Secondary | ICD-10-CM | POA: Diagnosis not present

## 2014-06-05 DIAGNOSIS — Z85528 Personal history of other malignant neoplasm of kidney: Secondary | ICD-10-CM | POA: Insufficient documentation

## 2014-06-05 DIAGNOSIS — S20212A Contusion of left front wall of thorax, initial encounter: Secondary | ICD-10-CM | POA: Diagnosis not present

## 2014-06-05 DIAGNOSIS — G4730 Sleep apnea, unspecified: Secondary | ICD-10-CM | POA: Insufficient documentation

## 2014-06-05 DIAGNOSIS — S199XXA Unspecified injury of neck, initial encounter: Secondary | ICD-10-CM | POA: Diagnosis present

## 2014-06-05 DIAGNOSIS — Z9981 Dependence on supplemental oxygen: Secondary | ICD-10-CM | POA: Insufficient documentation

## 2014-06-05 DIAGNOSIS — E119 Type 2 diabetes mellitus without complications: Secondary | ICD-10-CM | POA: Insufficient documentation

## 2014-06-05 DIAGNOSIS — S40012A Contusion of left shoulder, initial encounter: Secondary | ICD-10-CM | POA: Diagnosis not present

## 2014-06-05 DIAGNOSIS — S1093XA Contusion of unspecified part of neck, initial encounter: Secondary | ICD-10-CM | POA: Diagnosis not present

## 2014-06-05 DIAGNOSIS — E780 Pure hypercholesterolemia: Secondary | ICD-10-CM | POA: Insufficient documentation

## 2014-06-05 DIAGNOSIS — Y998 Other external cause status: Secondary | ICD-10-CM | POA: Insufficient documentation

## 2014-06-05 DIAGNOSIS — M1990 Unspecified osteoarthritis, unspecified site: Secondary | ICD-10-CM | POA: Insufficient documentation

## 2014-06-05 DIAGNOSIS — F329 Major depressive disorder, single episode, unspecified: Secondary | ICD-10-CM | POA: Diagnosis not present

## 2014-06-05 DIAGNOSIS — I10 Essential (primary) hypertension: Secondary | ICD-10-CM | POA: Insufficient documentation

## 2014-06-05 DIAGNOSIS — Y9389 Activity, other specified: Secondary | ICD-10-CM | POA: Insufficient documentation

## 2014-06-05 DIAGNOSIS — Z79899 Other long term (current) drug therapy: Secondary | ICD-10-CM | POA: Insufficient documentation

## 2014-06-05 DIAGNOSIS — T1490XA Injury, unspecified, initial encounter: Secondary | ICD-10-CM

## 2014-06-05 LAB — COMPREHENSIVE METABOLIC PANEL
ALK PHOS: 64 U/L (ref 38–126)
ALT: 16 U/L (ref 14–54)
AST: 23 U/L (ref 15–41)
Albumin: 3.8 g/dL (ref 3.5–5.0)
Anion gap: 12 (ref 5–15)
BUN: 28 mg/dL — ABNORMAL HIGH (ref 6–20)
CHLORIDE: 109 mmol/L (ref 101–111)
CO2: 19 mmol/L — AB (ref 22–32)
Calcium: 9.7 mg/dL (ref 8.9–10.3)
Creatinine, Ser: 1.42 mg/dL — ABNORMAL HIGH (ref 0.44–1.00)
GFR calc Af Amer: 44 mL/min — ABNORMAL LOW (ref >60)
GFR, EST NON AFRICAN AMERICAN: 38 mL/min — AB (ref >60)
GLUCOSE: 102 mg/dL — AB (ref 65–99)
Potassium: 4.3 mmol/L (ref 3.5–5.1)
Sodium: 140 mmol/L (ref 135–145)
TOTAL PROTEIN: 6.6 g/dL (ref 6.5–8.1)
Total Bilirubin: 0.8 mg/dL (ref 0.3–1.2)

## 2014-06-05 LAB — CDS SEROLOGY

## 2014-06-05 LAB — CBC
HCT: 37.2 % (ref 36.0–46.0)
Hemoglobin: 12.4 g/dL (ref 12.0–15.0)
MCH: 30.8 pg (ref 26.0–34.0)
MCHC: 33.3 g/dL (ref 30.0–36.0)
MCV: 92.3 fL (ref 78.0–100.0)
PLATELETS: 188 10*3/uL (ref 150–400)
RBC: 4.03 MIL/uL (ref 3.87–5.11)
RDW: 14 % (ref 11.5–15.5)
WBC: 8.1 10*3/uL (ref 4.0–10.5)

## 2014-06-05 LAB — I-STAT TROPONIN, ED: TROPONIN I, POC: 0 ng/mL (ref 0.00–0.08)

## 2014-06-05 LAB — SAMPLE TO BLOOD BANK

## 2014-06-05 LAB — PROTIME-INR
INR: 1.16 (ref 0.00–1.49)
PROTHROMBIN TIME: 15 s (ref 11.6–15.2)

## 2014-06-05 MED ORDER — SODIUM CHLORIDE 0.9 % IV BOLUS (SEPSIS)
1000.0000 mL | Freq: Once | INTRAVENOUS | Status: AC
  Administered 2014-06-05: 1000 mL via INTRAVENOUS

## 2014-06-05 MED ORDER — MORPHINE SULFATE 4 MG/ML IJ SOLN
4.0000 mg | Freq: Once | INTRAMUSCULAR | Status: AC
  Administered 2014-06-05: 4 mg via INTRAVENOUS
  Filled 2014-06-05: qty 1

## 2014-06-05 MED ORDER — MORPHINE SULFATE 2 MG/ML IJ SOLN
INTRAMUSCULAR | Status: AC
Start: 2014-06-05 — End: 2014-06-05
  Filled 2014-06-05: qty 2

## 2014-06-05 MED ORDER — ONDANSETRON HCL 4 MG/2ML IJ SOLN
4.0000 mg | Freq: Once | INTRAMUSCULAR | Status: AC
  Administered 2014-06-05: 4 mg via INTRAVENOUS

## 2014-06-05 MED ORDER — IOHEXOL 300 MG/ML  SOLN
150.0000 mL | Freq: Once | INTRAMUSCULAR | Status: AC | PRN
  Administered 2014-06-05: 150 mL via INTRAVENOUS

## 2014-06-05 MED ORDER — ONDANSETRON HCL 4 MG/2ML IJ SOLN
INTRAMUSCULAR | Status: AC
  Filled 2014-06-05: qty 2

## 2014-06-05 NOTE — ED Notes (Signed)
MD at bedside. 
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
## 2014-06-05 NOTE — ED Notes (Signed)
Pt up to bathroom with min. Amount of asst.

## 2014-06-05 NOTE — ED Notes (Signed)
Pt to CT at this time.

## 2014-06-05 NOTE — ED Provider Notes (Signed)
Imaging without acute traumatic findings. On neck imaging patient does have a thyroid lesion that recommends ultrasound biopsy. Findings discussed with the patient. Patient has pain medication at home and will discharge home to follow-up with PCP. Patient is able to ambulate in the department without difficulty.  Patient has appointment on Thursday for biopsy of the area in her thyroid.  Blanchie Dessert, MD 06/05/14 1932

## 2014-06-05 NOTE — ED Notes (Signed)
Pt remains in CT at this time.  

## 2014-06-05 NOTE — ED Notes (Signed)
Pt up to bathroom with minimal asst.
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
## 2014-06-05 NOTE — Discharge Instructions (Signed)
Contusion °A contusion is a deep bruise. Contusions are the result of an injury that caused bleeding under the skin. The contusion may turn blue, purple, or yellow. Minor injuries will give you a painless contusion, but more severe contusions may stay painful and swollen for a few weeks.  °CAUSES  °A contusion is usually caused by a blow, trauma, or direct force to an area of the body. °SYMPTOMS  °· Swelling and redness of the injured area. °· Bruising of the injured area. °· Tenderness and soreness of the injured area. °· Pain. °DIAGNOSIS  °The diagnosis can be made by taking a history and physical exam. An X-ray, CT scan, or MRI may be needed to determine if there were any associated injuries, such as fractures. °TREATMENT  °Specific treatment will depend on what area of the body was injured. In general, the best treatment for a contusion is resting, icing, elevating, and applying cold compresses to the injured area. Over-the-counter medicines may also be recommended for pain control. Ask your caregiver what the best treatment is for your contusion. °HOME CARE INSTRUCTIONS  °· Put ice on the injured area. °¨ Put ice in a plastic bag. °¨ Place a towel between your skin and the bag. °¨ Leave the ice on for 15-20 minutes, 3-4 times a day, or as directed by your health care provider. °· Only take over-the-counter or prescription medicines for pain, discomfort, or fever as directed by your caregiver. Your caregiver may recommend avoiding anti-inflammatory medicines (aspirin, ibuprofen, and naproxen) for 48 hours because these medicines may increase bruising. °· Rest the injured area. °· If possible, elevate the injured area to reduce swelling. °SEEK IMMEDIATE MEDICAL CARE IF:  °· You have increased bruising or swelling. °· You have pain that is getting worse. °· Your swelling or pain is not relieved with medicines. °MAKE SURE YOU:  °· Understand these instructions. °· Will watch your condition. °· Will get help right  away if you are not doing well or get worse. °Document Released: 10/08/2004 Document Revised: 01/03/2013 Document Reviewed: 11/03/2010 °[employer]® Patient Information ©2015 [employer]. This information is not intended to replace advice given to you by your health care provider. Make sure you discuss any questions you have with your health care provider. ° °

## 2014-06-07 ENCOUNTER — Ambulatory Visit (HOSPITAL_COMMUNITY): Payer: Self-pay | Admitting: Psychiatry

## 2014-06-09 ENCOUNTER — Other Ambulatory Visit: Payer: Self-pay | Admitting: Physician Assistant

## 2014-06-13 ENCOUNTER — Encounter (HOSPITAL_COMMUNITY): Payer: PPO | Attending: Hematology and Oncology | Admitting: Hematology & Oncology

## 2014-06-13 ENCOUNTER — Encounter (HOSPITAL_COMMUNITY): Payer: Self-pay | Admitting: Hematology & Oncology

## 2014-06-13 ENCOUNTER — Encounter (HOSPITAL_BASED_OUTPATIENT_CLINIC_OR_DEPARTMENT_OTHER): Payer: PPO

## 2014-06-13 VITALS — BP 119/40 | HR 53 | Temp 97.6°F | Resp 18

## 2014-06-13 VITALS — BP 106/57 | HR 61 | Temp 97.7°F | Resp 16 | Wt 183.0 lb

## 2014-06-13 DIAGNOSIS — Z5112 Encounter for antineoplastic immunotherapy: Secondary | ICD-10-CM

## 2014-06-13 DIAGNOSIS — R918 Other nonspecific abnormal finding of lung field: Secondary | ICD-10-CM | POA: Insufficient documentation

## 2014-06-13 DIAGNOSIS — C649 Malignant neoplasm of unspecified kidney, except renal pelvis: Secondary | ICD-10-CM | POA: Diagnosis present

## 2014-06-13 DIAGNOSIS — F32A Depression, unspecified: Secondary | ICD-10-CM

## 2014-06-13 DIAGNOSIS — C7801 Secondary malignant neoplasm of right lung: Secondary | ICD-10-CM | POA: Diagnosis not present

## 2014-06-13 DIAGNOSIS — R5383 Other fatigue: Secondary | ICD-10-CM | POA: Diagnosis present

## 2014-06-13 DIAGNOSIS — C7802 Secondary malignant neoplasm of left lung: Secondary | ICD-10-CM | POA: Diagnosis not present

## 2014-06-13 DIAGNOSIS — K219 Gastro-esophageal reflux disease without esophagitis: Secondary | ICD-10-CM | POA: Diagnosis present

## 2014-06-13 DIAGNOSIS — E038 Other specified hypothyroidism: Secondary | ICD-10-CM

## 2014-06-13 DIAGNOSIS — F329 Major depressive disorder, single episode, unspecified: Secondary | ICD-10-CM

## 2014-06-13 DIAGNOSIS — E039 Hypothyroidism, unspecified: Secondary | ICD-10-CM | POA: Diagnosis not present

## 2014-06-13 LAB — COMPREHENSIVE METABOLIC PANEL
ALT: 32 U/L (ref 14–54)
AST: 19 U/L (ref 15–41)
Albumin: 3.8 g/dL (ref 3.5–5.0)
Alkaline Phosphatase: 89 U/L (ref 38–126)
Anion gap: 9 (ref 5–15)
BILIRUBIN TOTAL: 0.5 mg/dL (ref 0.3–1.2)
BUN: 27 mg/dL — ABNORMAL HIGH (ref 6–20)
CO2: 24 mmol/L (ref 22–32)
Calcium: 9.5 mg/dL (ref 8.9–10.3)
Chloride: 109 mmol/L (ref 101–111)
Creatinine, Ser: 1.63 mg/dL — ABNORMAL HIGH (ref 0.44–1.00)
GFR calc Af Amer: 37 mL/min — ABNORMAL LOW (ref >60)
GFR, EST NON AFRICAN AMERICAN: 32 mL/min — AB (ref >60)
Glucose, Bld: 135 mg/dL — ABNORMAL HIGH (ref 65–99)
POTASSIUM: 3.8 mmol/L (ref 3.5–5.1)
Sodium: 142 mmol/L (ref 135–145)
TOTAL PROTEIN: 7.2 g/dL (ref 6.5–8.1)

## 2014-06-13 LAB — CBC WITH DIFFERENTIAL/PLATELET
BASOS ABS: 0 10*3/uL (ref 0.0–0.1)
Basophils Relative: 0 % (ref 0–1)
Eosinophils Absolute: 0.2 10*3/uL (ref 0.0–0.7)
Eosinophils Relative: 3 % (ref 0–5)
HEMATOCRIT: 34.8 % — AB (ref 36.0–46.0)
Hemoglobin: 11.5 g/dL — ABNORMAL LOW (ref 12.0–15.0)
Lymphocytes Relative: 18 % (ref 12–46)
Lymphs Abs: 1.2 10*3/uL (ref 0.7–4.0)
MCH: 30.8 pg (ref 26.0–34.0)
MCHC: 33 g/dL (ref 30.0–36.0)
MCV: 93.3 fL (ref 78.0–100.0)
MONO ABS: 0.2 10*3/uL (ref 0.1–1.0)
MONOS PCT: 4 % (ref 3–12)
Neutro Abs: 5 10*3/uL (ref 1.7–7.7)
Neutrophils Relative %: 75 % (ref 43–77)
PLATELETS: 190 10*3/uL (ref 150–400)
RBC: 3.73 MIL/uL — AB (ref 3.87–5.11)
RDW: 14.1 % (ref 11.5–15.5)
WBC: 6.7 10*3/uL (ref 4.0–10.5)

## 2014-06-13 LAB — TSH: TSH: 0.672 u[IU]/mL (ref 0.350–4.500)

## 2014-06-13 MED ORDER — SODIUM CHLORIDE 0.9 % IV SOLN
Freq: Once | INTRAVENOUS | Status: AC
  Administered 2014-06-13: 12:00:00 via INTRAVENOUS

## 2014-06-13 MED ORDER — NIVOLUMAB CHEMO INJECTION 100 MG/10ML
3.0000 mg/kg | Freq: Once | INTRAVENOUS | Status: AC
  Administered 2014-06-13: 240 mg via INTRAVENOUS
  Filled 2014-06-13: qty 20

## 2014-06-13 MED ORDER — SODIUM CHLORIDE 0.9 % IJ SOLN
10.0000 mL | INTRAMUSCULAR | Status: DC | PRN
  Administered 2014-06-13: 10 mL
  Filled 2014-06-13: qty 10

## 2014-06-13 MED ORDER — HEPARIN SOD (PORK) LOCK FLUSH 100 UNIT/ML IV SOLN
500.0000 [IU] | Freq: Once | INTRAVENOUS | Status: AC | PRN
  Administered 2014-06-13: 500 [IU]
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
## 2014-06-13 NOTE — Patient Instructions (Addendum)
[place] at [facility] Discharge Instructions  RECOMMENDATIONS MADE BY THE CONSULTANT AND ANY TEST RESULTS WILL BE SENT TO YOUR REFERRING PHYSICIAN.  Exam completed by Dr Whitney Muse today Chemotherapy as planned today Follow up with the doctor in 1 month Follow up for chemotherapy in 2 weeks.   Please call the clinic if you have any questions or concerns   Thank you for choosing [place] at [facility] to provide your oncology and hematology care.  To afford each patient quality time with our provider, please arrive at least 15 minutes before your scheduled appointment time.    You need to re-schedule your appointment should you arrive 10 or more minutes late.  We strive to give you quality time with our providers, and arriving late affects you and other patients whose appointments are after yours.  Also, if you no show three or more times for appointments you may be dismissed from the clinic at the providers discretion.     Again, thank you for choosing [facility].  Our hope is that these requests will decrease the amount of time that you wait before being seen by our physicians.       _____________________________________________________________  Should you have questions after your visit to [facility], please contact our office at [phone] between the hours of 8:30 a.m. and 4:30 p.m.  Voicemails left after 4:30 p.m. will not be returned until the following business day.  For prescription refill requests, have your pharmacy contact our office.

## 2014-06-13 NOTE — Progress Notes (Signed)
Tolerated infusion well. 

## 2014-06-20 ENCOUNTER — Ambulatory Visit (INDEPENDENT_AMBULATORY_CARE_PROVIDER_SITE_OTHER): Payer: PPO | Admitting: Psychiatry

## 2014-06-20 ENCOUNTER — Encounter (HOSPITAL_COMMUNITY): Payer: Self-pay | Admitting: Psychiatry

## 2014-06-20 VITALS — BP 146/78 | HR 74 | Ht 66.0 in | Wt 182.0 lb

## 2014-06-20 DIAGNOSIS — F329 Major depressive disorder, single episode, unspecified: Secondary | ICD-10-CM

## 2014-06-20 DIAGNOSIS — F32A Depression, unspecified: Secondary | ICD-10-CM

## 2014-06-20 MED ORDER — DIAZEPAM 10 MG PO TABS: 10.0000 mg | ORAL_TABLET | Freq: Four times a day (QID) | ORAL | Status: DC

## 2014-06-20 MED ORDER — VILAZODONE HCL 40 MG PO TABS
40.0000 mg | ORAL_TABLET | Freq: Every day | ORAL | Status: DC
Start: 2014-06-20 — End: 2014-08-01

## 2014-06-20 MED ORDER — ZOLPIDEM TARTRATE 5 MG PO TABS: 5.0000 mg | ORAL_TABLET | Freq: Every evening | ORAL | Status: DC | PRN
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
## 2014-06-25 ENCOUNTER — Encounter (HOSPITAL_BASED_OUTPATIENT_CLINIC_OR_DEPARTMENT_OTHER): Payer: PPO

## 2014-06-25 ENCOUNTER — Other Ambulatory Visit (HOSPITAL_COMMUNITY): Payer: Self-pay | Admitting: Hematology & Oncology

## 2014-06-25 ENCOUNTER — Encounter (HOSPITAL_COMMUNITY): Payer: Self-pay

## 2014-06-25 VITALS — BP 105/50 | HR 60 | Temp 98.0°F | Resp 18 | Wt 182.4 lb

## 2014-06-25 DIAGNOSIS — C7800 Secondary malignant neoplasm of unspecified lung: Secondary | ICD-10-CM

## 2014-06-25 DIAGNOSIS — C649 Malignant neoplasm of unspecified kidney, except renal pelvis: Secondary | ICD-10-CM

## 2014-06-25 DIAGNOSIS — Z5112 Encounter for antineoplastic immunotherapy: Secondary | ICD-10-CM

## 2014-06-25 LAB — COMPREHENSIVE METABOLIC PANEL
ALBUMIN: 3.9 g/dL (ref 3.5–5.0)
ALT: 15 U/L (ref 14–54)
AST: 16 U/L (ref 15–41)
Alkaline Phosphatase: 109 U/L (ref 38–126)
Anion gap: 8 (ref 5–15)
BILIRUBIN TOTAL: 0.6 mg/dL (ref 0.3–1.2)
BUN: 33 mg/dL — ABNORMAL HIGH (ref 6–20)
CHLORIDE: 111 mmol/L (ref 101–111)
CO2: 22 mmol/L (ref 22–32)
CREATININE: 1.54 mg/dL — AB (ref 0.44–1.00)
Calcium: 9.6 mg/dL (ref 8.9–10.3)
GFR calc Af Amer: 40 mL/min — ABNORMAL LOW (ref >60)
GFR calc non Af Amer: 35 mL/min — ABNORMAL LOW (ref >60)
Glucose, Bld: 83 mg/dL (ref 65–99)
POTASSIUM: 4.1 mmol/L (ref 3.5–5.1)
Sodium: 141 mmol/L (ref 135–145)
Total Protein: 7.2 g/dL (ref 6.5–8.1)

## 2014-06-25 LAB — CBC WITH DIFFERENTIAL/PLATELET
BASOS ABS: 0 10*3/uL (ref 0.0–0.1)
BASOS PCT: 1 % (ref 0–1)
Eosinophils Absolute: 0.2 10*3/uL (ref 0.0–0.7)
Eosinophils Relative: 3 % (ref 0–5)
HCT: 44.3 % (ref 36.0–46.0)
Hemoglobin: 14.9 g/dL (ref 12.0–15.0)
Lymphocytes Relative: 41 % (ref 12–46)
Lymphs Abs: 2.5 10*3/uL (ref 0.7–4.0)
MCH: 29.6 pg (ref 26.0–34.0)
MCHC: 33.6 g/dL (ref 30.0–36.0)
MCV: 87.9 fL (ref 78.0–100.0)
Monocytes Absolute: 0.8 10*3/uL (ref 0.1–1.0)
Monocytes Relative: 13 % — ABNORMAL HIGH (ref 3–12)
NEUTROS ABS: 2.5 10*3/uL (ref 1.7–7.7)
NEUTROS PCT: 42 % — AB (ref 43–77)
PLATELETS: 273 10*3/uL (ref 150–400)
RBC: 5.04 MIL/uL (ref 3.87–5.11)
RDW: 15.2 % (ref 11.5–15.5)
WBC: 6 10*3/uL (ref 4.0–10.5)

## 2014-06-25 LAB — TSH: TSH: 1.137 u[IU]/mL (ref 0.350–4.500)

## 2014-06-25 MED ORDER — HEPARIN SOD (PORK) LOCK FLUSH 100 UNIT/ML IV SOLN
500.0000 [IU] | Freq: Once | INTRAVENOUS | Status: AC | PRN
  Administered 2014-06-25: 500 [IU]
  Filled 2014-06-25: qty 5

## 2014-06-25 MED ORDER — SODIUM CHLORIDE 0.9 % IJ SOLN: 10.0000 mL | INTRAMUSCULAR | Status: DC | PRN

## 2014-06-25 MED ORDER — SODIUM CHLORIDE 0.9 % IV SOLN
3.0000 mg/kg | Freq: Once | INTRAVENOUS | Status: AC
  Administered 2014-06-25: 240 mg via INTRAVENOUS
  Filled 2014-06-25: qty 20

## 2014-06-25 MED ORDER — OXYCODONE HCL 5 MG PO TABS: 5.0000 mg | ORAL_TABLET | Freq: Four times a day (QID) | ORAL | Status: DC | PRN

## 2014-06-25 MED ORDER — SODIUM CHLORIDE 0.9 % IV SOLN
Freq: Once | INTRAVENOUS | Status: AC
  Administered 2014-06-25: 14:00:00 via INTRAVENOUS

## 2014-06-25 NOTE — Progress Notes (Signed)
[name] [name] Tolerated chemotherapy well  Discharged via wheelchair

## 2014-06-25 NOTE — Patient Instructions (Signed)
[facility] Discharge Instructions for Patients Receiving Chemotherapy  Today you received the following chemotherapy agents opdivo Please call the clinic if you have any questions or concerns Follow up as scheduled  To help prevent nausea and vomiting after your treatment, we encourage you to take your nausea medication   If you develop nausea and vomiting, or diarrhea that is not controlled by your medication, call the clinic.  The clinic phone number is [phone]. Office hours are Monday-Friday 8:30am-5:00pm.  BELOW ARE SYMPTOMS THAT SHOULD BE REPORTED IMMEDIATELY:  *FEVER GREATER THAN 101.0 F  *CHILLS WITH OR WITHOUT FEVER  NAUSEA AND VOMITING THAT IS NOT CONTROLLED WITH YOUR NAUSEA MEDICATION  *UNUSUAL SHORTNESS OF BREATH  *UNUSUAL BRUISING OR BLEEDING  TENDERNESS IN MOUTH AND THROAT WITH OR WITHOUT PRESENCE OF ULCERS  *URINARY PROBLEMS  *BOWEL PROBLEMS  UNUSUAL RASH Items with * indicate a potential emergency and should be followed up as soon as possible. If you have an emergency after office hours please contact your primary care physician or go to the nearest emergency department.  Please call the clinic during office hours if you have any questions or concerns.   You may also contact the Patient Navigator at [phone] should you have any questions or need assistance in obtaining follow up care. _____________________________________________________________________ Have you asked about our STAR program?    STAR stands for Survivorship Training and Rehabilitation, and this is a nationally recognized cancer care program that focuses on survivorship and rehabilitation.  Cancer and cancer treatments may cause problems, such as, pain, making you feel tired and keeping you from doing the things that you need or want to do. Cancer rehabilitation can help. Our goal is to reduce these troubling effects and help you have the best quality of life  possible.  You may receive a survey from a nurse that asks questions about your current state of health.  Based on the survey results, all eligible patients will be referred to the [facility] program for an evaluation so we can better serve you! A frequently asked questions sheet is available upon request.

## 2014-06-28 ENCOUNTER — Encounter: Payer: Self-pay | Admitting: Physician Assistant

## 2014-06-28 ENCOUNTER — Ambulatory Visit (INDEPENDENT_AMBULATORY_CARE_PROVIDER_SITE_OTHER): Payer: PPO | Admitting: Physician Assistant

## 2014-06-28 VITALS — BP 136/82 | HR 76 | Temp 97.6°F | Resp 18 | Wt 182.0 lb

## 2014-06-28 DIAGNOSIS — C649 Malignant neoplasm of unspecified kidney, except renal pelvis: Secondary | ICD-10-CM | POA: Diagnosis not present

## 2014-06-28 DIAGNOSIS — F329 Major depressive disorder, single episode, unspecified: Secondary | ICD-10-CM | POA: Diagnosis not present

## 2014-06-28 DIAGNOSIS — F32A Depression, unspecified: Secondary | ICD-10-CM

## 2014-06-28 NOTE — Progress Notes (Signed)
Patient ID: [name] [name] MRN: [id number], DOB: [birth year], 64 y.o. Date of Encounter: 06/28/2014, 12:14 PM    Chief Complaint:  Chief Complaint  Patient presents with  . OTHER    wants to be checked if she has Dementia, being very forgetful     HPI: 64 y.o. year old white female here with her husband for visit today. Patient states that she scheduled this appointment because she feels that she is having problems with her memories and problems with confusion and feels like her family is making fun of her because of this. Wanted to see if there was some treatment available to help with this.     Home Meds:   Outpatient Prescriptions Prior to Visit  Medication Sig Dispense Refill  . acetaminophen (TYLENOL) 500 MG tablet Take 1,000 mg by mouth every 6 (six) hours as needed (Pain).    . Ascorbic Acid (VITAMIN C) 1000 MG tablet Take 1,000 mg by mouth daily. Take 1,000 mg by mouth daily.    . bisacodyl (BISACODYL) 5 MG EC tablet Take 5 mg by mouth daily as needed for moderate constipation.    . Cholecalciferol (VITAMIN D) 2000 UNITS CAPS Take 2,000 Units by mouth daily.    . cyclobenzaprine (FLEXERIL) 5 MG tablet Take 5 mg by mouth 3 (three) times daily as needed for muscle spasms.    . diazepam (VALIUM) 10 MG tablet Take 1 tablet (10 mg total) by mouth 4 (four) times daily. 120 tablet 2  . levothyroxine (SYNTHROID, LEVOTHROID) 50 MCG tablet Take 1 tablet (50 mcg total) by mouth daily before breakfast. 30 tablet 0  . lidocaine-prilocaine (EMLA) cream Apply a quarter size amount to port site 1 hour prior to chemo. Do not rub in. Cover with plastic wrap. 30 g 3  . lisinopril-hydrochlorothiazide (PRINZIDE,ZESTORETIC) 20-12.5 MG per tablet TAKE ONE TABLET BY MOUTH ONCE DAILY WITH BREAKFAST 90 tablet 0  . NIVOLUMAB IV Inject 1 Dose into the vein every 14 (fourteen) days.     . ondansetron (ZOFRAN) 8 MG tablet Take 1 tablet (8 mg total) by mouth every 8 (eight) hours as needed for nausea. 60  tablet 2  . oxyCODONE (OXY IR/ROXICODONE) 5 MG immediate release tablet Take 1-2 tablets (5-10 mg total) by mouth every 6 (six) hours as needed for severe pain. 60 tablet 0  . ranitidine (ZANTAC) 150 MG tablet TAKE ONE TABLET BY MOUTH TWICE DAILY 180 tablet 1  . simvastatin (ZOCOR) 40 MG tablet TAKE ONE TABLET BY MOUTH ONCE DAILY AT BEDTIME 90 tablet 1  . sucralfate (CARAFATE) 1 GM/10ML suspension Take 10 mLs (1 g total) by mouth 4 (four) times daily -  with meals and at bedtime. 1260 mL 5  . Vilazodone HCl (VIIBRYD) 40 MG TABS Take 1 tablet (40 mg total) by mouth daily. 30 tablet 2  . zolpidem (AMBIEN) 5 MG tablet Take 1 tablet (5 mg total) by mouth at bedtime as needed for sleep. 30 tablet 2   No facility-administered medications prior to visit.    Allergies:  Allergies  Allergen Reactions  . Compazine [Prochlorperazine Maleate] Other (See Comments)    Tardive dyskinesia  . Phenergan [Promethazine Hcl] Other (See Comments)    Tardive Dyskinesia (Compazine)  . Phenothiazines Other (See Comments)    Tardive dyskinesia (Compazine)      Review of Systems: See HPI for pertinent ROS. All other ROS negative.    Physical Exam: Blood pressure 136/82, pulse 76, temperature 97.6 F (36.4 C),  temperature source Oral, resp. rate 18, weight 182 lb (82.555 kg)., Body mass index is 29.39 kg/(m^2). General: Overweight WF.  Appears in no acute distress. Neck: Supple. No thyromegaly. No lymphadenopathy. Lungs: Clear bilaterally to auscultation without wheezes, rales, or rhonchi. Breathing is unlabored. Heart: Regular rhythm. No murmurs, rubs, or gallops. Msk:  Strength and tone normal for age. Extremities/Skin: Warm and dry.  Neuro: Alert and oriented X 3. Moves all extremities spontaneously. Gait is normal. CNII-XII grossly in tact. Psych: Flat affect. Starting crying at one point during visit.      ASSESSMENT AND PLAN:  64 y.o. year old female with  1. Depression   2. Metastatic renal  cell carcinoma, unspecified laterality  I discussed with patient and her husband that even if evaluation was consistent with dementia, but I do not think that using those prescription medications would be beneficial to her. Discussed that I feel that her current symptoms of confusion and forgetfulness are multifactorial--- secondary to the cancer itself, secondary to the chemotherapy medications, secondary to other medications that she is using including Valium 10 mg, oxycodone. Also feel that these current symptoms are secondary to her depression. Psychiatry recently started Viibryd and Ambien. Discussed with patient and husband that I feel that she needs to relay these concerns of confusion and forgetfulness to that psychiatrist at that next visit with them. Patient and husband both voice understanding and agree with this treatment plan. Also, I did review that last CT of head was 06/05/2014. It showed old lacunar infarct on left. No other acute abnormality.  [address] [place], [place], [facility] 06/28/2014 12:14 PM

## 2014-07-09 ENCOUNTER — Encounter (HOSPITAL_COMMUNITY): Payer: Self-pay

## 2014-07-09 ENCOUNTER — Encounter (HOSPITAL_BASED_OUTPATIENT_CLINIC_OR_DEPARTMENT_OTHER): Payer: PPO | Admitting: Hematology & Oncology

## 2014-07-09 ENCOUNTER — Other Ambulatory Visit: Payer: Self-pay

## 2014-07-09 ENCOUNTER — Encounter (HOSPITAL_COMMUNITY): Payer: Self-pay | Admitting: Hematology & Oncology

## 2014-07-09 ENCOUNTER — Encounter (HOSPITAL_BASED_OUTPATIENT_CLINIC_OR_DEPARTMENT_OTHER): Payer: PPO

## 2014-07-09 VITALS — BP 129/59 | HR 56 | Temp 97.7°F | Resp 18 | Wt 180.6 lb

## 2014-07-09 DIAGNOSIS — F329 Major depressive disorder, single episode, unspecified: Secondary | ICD-10-CM

## 2014-07-09 DIAGNOSIS — C7800 Secondary malignant neoplasm of unspecified lung: Secondary | ICD-10-CM

## 2014-07-09 DIAGNOSIS — F32A Depression, unspecified: Secondary | ICD-10-CM

## 2014-07-09 DIAGNOSIS — E039 Hypothyroidism, unspecified: Secondary | ICD-10-CM

## 2014-07-09 DIAGNOSIS — C649 Malignant neoplasm of unspecified kidney, except renal pelvis: Secondary | ICD-10-CM

## 2014-07-09 DIAGNOSIS — Z5112 Encounter for antineoplastic immunotherapy: Secondary | ICD-10-CM | POA: Diagnosis not present

## 2014-07-09 LAB — CBC WITH DIFFERENTIAL/PLATELET
Basophils Absolute: 0 10*3/uL (ref 0.0–0.1)
Basophils Relative: 0 % (ref 0–1)
EOS PCT: 5 % (ref 0–5)
Eosinophils Absolute: 0.3 10*3/uL (ref 0.0–0.7)
HCT: 35.8 % — ABNORMAL LOW (ref 36.0–46.0)
Hemoglobin: 11.8 g/dL — ABNORMAL LOW (ref 12.0–15.0)
LYMPHS PCT: 26 % (ref 12–46)
Lymphs Abs: 1.3 10*3/uL (ref 0.7–4.0)
MCH: 30.5 pg (ref 26.0–34.0)
MCHC: 33 g/dL (ref 30.0–36.0)
MCV: 92.5 fL (ref 78.0–100.0)
MONOS PCT: 6 % (ref 3–12)
Monocytes Absolute: 0.3 10*3/uL (ref 0.1–1.0)
NEUTROS PCT: 63 % (ref 43–77)
Neutro Abs: 3.1 10*3/uL (ref 1.7–7.7)
PLATELETS: 158 10*3/uL (ref 150–400)
RBC: 3.87 MIL/uL (ref 3.87–5.11)
RDW: 14.4 % (ref 11.5–15.5)
WBC: 5 10*3/uL (ref 4.0–10.5)

## 2014-07-09 LAB — COMPREHENSIVE METABOLIC PANEL
ALBUMIN: 3.9 g/dL (ref 3.5–5.0)
ALK PHOS: 90 U/L (ref 38–126)
ALT: 13 U/L — ABNORMAL LOW (ref 14–54)
AST: 21 U/L (ref 15–41)
Anion gap: 9 (ref 5–15)
BILIRUBIN TOTAL: 0.7 mg/dL (ref 0.3–1.2)
BUN: 23 mg/dL — AB (ref 6–20)
CO2: 22 mmol/L (ref 22–32)
Calcium: 9.7 mg/dL (ref 8.9–10.3)
Chloride: 108 mmol/L (ref 101–111)
Creatinine, Ser: 1.39 mg/dL — ABNORMAL HIGH (ref 0.44–1.00)
GFR calc Af Amer: 45 mL/min — ABNORMAL LOW (ref >60)
GFR calc non Af Amer: 39 mL/min — ABNORMAL LOW (ref >60)
GLUCOSE: 137 mg/dL — AB (ref 65–99)
Potassium: 3.8 mmol/L (ref 3.5–5.1)
Sodium: 139 mmol/L (ref 135–145)
TOTAL PROTEIN: 7 g/dL (ref 6.5–8.1)

## 2014-07-09 LAB — TSH: TSH: 1.143 u[IU]/mL (ref 0.350–4.500)

## 2014-07-09 MED ORDER — SODIUM CHLORIDE 0.9 % IJ SOLN: 10.0000 mL | INTRAMUSCULAR | Status: DC | PRN

## 2014-07-09 MED ORDER — SODIUM CHLORIDE 0.9 % IV SOLN
Freq: Once | INTRAVENOUS | Status: AC
  Administered 2014-07-09: 13:00:00 via INTRAVENOUS

## 2014-07-09 MED ORDER — SODIUM CHLORIDE 0.9 % IV SOLN
3.0000 mg/kg | Freq: Once | INTRAVENOUS | Status: AC
  Administered 2014-07-09: 240 mg via INTRAVENOUS
  Filled 2014-07-09: qty 20

## 2014-07-09 MED ORDER — HEPARIN SOD (PORK) LOCK FLUSH 100 UNIT/ML IV SOLN
500.0000 [IU] | Freq: Once | INTRAVENOUS | Status: AC | PRN
  Administered 2014-07-09: 500 [IU]
  Filled 2014-07-09: qty 5

## 2014-07-09 NOTE — Patient Instructions (Signed)
..  [place] at [facility] Discharge Instructions  RECOMMENDATIONS MADE BY THE CONSULTANT AND ANY TEST RESULTS WILL BE SENT TO YOUR REFERRING PHYSICIAN.  Exam per Dr. Whitney Muse  Thank you for choosing [place] at [facility] to provide your oncology and hematology care.  To afford each patient quality time with our provider, please arrive at least 15 minutes before your scheduled appointment time.    You need to re-schedule your appointment should you arrive 10 or more minutes late.  We strive to give you quality time with our providers, and arriving late affects you and other patients whose appointments are after yours.  Also, if you no show three or more times for appointments you may be dismissed from the clinic at the providers discretion.     Again, thank you for choosing [facility].  Our hope is that these requests will decrease the amount of time that you wait before being seen by our physicians.       _____________________________________________________________  Should you have questions after your visit to [facility], please contact our office at [phone] between the hours of 8:30 a.m. and 4:30 p.m.  Voicemails left after 4:30 p.m. will not be returned until the following business day.  For prescription refill requests, have your pharmacy contact our office.

## 2014-07-09 NOTE — Progress Notes (Signed)
Tolerated well
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
## 2014-07-18 ENCOUNTER — Other Ambulatory Visit (HOSPITAL_COMMUNITY): Payer: Self-pay | Admitting: Oncology

## 2014-07-18 DIAGNOSIS — C649 Malignant neoplasm of unspecified kidney, except renal pelvis: Secondary | ICD-10-CM

## 2014-07-18 DIAGNOSIS — R1110 Vomiting, unspecified: Secondary | ICD-10-CM

## 2014-07-18 MED ORDER — ONDANSETRON HCL 8 MG PO TABS: 8.0000 mg | ORAL_TABLET | Freq: Three times a day (TID) | ORAL | Status: DC | PRN

## 2014-07-19 ENCOUNTER — Ambulatory Visit (INDEPENDENT_AMBULATORY_CARE_PROVIDER_SITE_OTHER): Payer: PPO | Admitting: Psychiatry

## 2014-07-19 DIAGNOSIS — F329 Major depressive disorder, single episode, unspecified: Secondary | ICD-10-CM | POA: Diagnosis not present

## 2014-07-19 DIAGNOSIS — F32A Depression, unspecified: Secondary | ICD-10-CM

## 2014-07-19 NOTE — Patient Instructions (Signed)
Discussed orally 
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
## 2014-07-23 ENCOUNTER — Encounter (HOSPITAL_COMMUNITY): Payer: PPO | Attending: Hematology and Oncology

## 2014-07-23 ENCOUNTER — Encounter (HOSPITAL_BASED_OUTPATIENT_CLINIC_OR_DEPARTMENT_OTHER): Payer: PPO | Admitting: Oncology

## 2014-07-23 VITALS — BP 102/52 | HR 58 | Temp 98.3°F | Resp 18 | Wt 178.8 lb

## 2014-07-23 DIAGNOSIS — R5383 Other fatigue: Secondary | ICD-10-CM | POA: Diagnosis present

## 2014-07-23 DIAGNOSIS — C649 Malignant neoplasm of unspecified kidney, except renal pelvis: Secondary | ICD-10-CM | POA: Diagnosis present

## 2014-07-23 DIAGNOSIS — R918 Other nonspecific abnormal finding of lung field: Secondary | ICD-10-CM | POA: Insufficient documentation

## 2014-07-23 DIAGNOSIS — K279 Peptic ulcer, site unspecified, unspecified as acute or chronic, without hemorrhage or perforation: Secondary | ICD-10-CM

## 2014-07-23 DIAGNOSIS — Z5112 Encounter for antineoplastic immunotherapy: Secondary | ICD-10-CM

## 2014-07-23 DIAGNOSIS — B009 Herpesviral infection, unspecified: Secondary | ICD-10-CM

## 2014-07-23 DIAGNOSIS — K219 Gastro-esophageal reflux disease without esophagitis: Secondary | ICD-10-CM | POA: Diagnosis present

## 2014-07-23 DIAGNOSIS — C7800 Secondary malignant neoplasm of unspecified lung: Secondary | ICD-10-CM

## 2014-07-23 DIAGNOSIS — L989 Disorder of the skin and subcutaneous tissue, unspecified: Secondary | ICD-10-CM

## 2014-07-23 DIAGNOSIS — B354 Tinea corporis: Secondary | ICD-10-CM

## 2014-07-23 LAB — COMPREHENSIVE METABOLIC PANEL
ALT: 13 U/L — ABNORMAL LOW (ref 14–54)
ANION GAP: 9 (ref 5–15)
AST: 22 U/L (ref 15–41)
Albumin: 4 g/dL (ref 3.5–5.0)
Alkaline Phosphatase: 74 U/L (ref 38–126)
BUN: 26 mg/dL — ABNORMAL HIGH (ref 6–20)
CHLORIDE: 107 mmol/L (ref 101–111)
CO2: 21 mmol/L — ABNORMAL LOW (ref 22–32)
Calcium: 9.4 mg/dL (ref 8.9–10.3)
Creatinine, Ser: 1.49 mg/dL — ABNORMAL HIGH (ref 0.44–1.00)
GFR, EST AFRICAN AMERICAN: 42 mL/min — AB (ref >60)
GFR, EST NON AFRICAN AMERICAN: 36 mL/min — AB (ref >60)
GLUCOSE: 141 mg/dL — AB (ref 65–99)
Potassium: 3.9 mmol/L (ref 3.5–5.1)
Sodium: 137 mmol/L (ref 135–145)
Total Bilirubin: 0.8 mg/dL (ref 0.3–1.2)
Total Protein: 7.2 g/dL (ref 6.5–8.1)

## 2014-07-23 LAB — CBC WITH DIFFERENTIAL/PLATELET
BASOS ABS: 0 10*3/uL (ref 0.0–0.1)
Basophils Relative: 0 % (ref 0–1)
Eosinophils Absolute: 0.2 10*3/uL (ref 0.0–0.7)
Eosinophils Relative: 4 % (ref 0–5)
HEMATOCRIT: 35.9 % — AB (ref 36.0–46.0)
Hemoglobin: 12.3 g/dL (ref 12.0–15.0)
Lymphocytes Relative: 20 % (ref 12–46)
Lymphs Abs: 1.2 10*3/uL (ref 0.7–4.0)
MCH: 31.2 pg (ref 26.0–34.0)
MCHC: 34.3 g/dL (ref 30.0–36.0)
MCV: 91.1 fL (ref 78.0–100.0)
Monocytes Absolute: 0.3 10*3/uL (ref 0.1–1.0)
Monocytes Relative: 4 % (ref 3–12)
Neutro Abs: 4.3 10*3/uL (ref 1.7–7.7)
Neutrophils Relative %: 72 % (ref 43–77)
Platelets: 170 10*3/uL (ref 150–400)
RBC: 3.94 MIL/uL (ref 3.87–5.11)
RDW: 14.4 % (ref 11.5–15.5)
WBC: 6 10*3/uL (ref 4.0–10.5)

## 2014-07-23 LAB — TSH: TSH: 1.144 u[IU]/mL (ref 0.350–4.500)

## 2014-07-23 MED ORDER — SODIUM CHLORIDE 0.9 % IV SOLN
Freq: Once | INTRAVENOUS | Status: AC
  Administered 2014-07-23: 11:00:00 via INTRAVENOUS

## 2014-07-23 MED ORDER — SODIUM CHLORIDE 0.9 % IV SOLN
3.0000 mg/kg | Freq: Once | INTRAVENOUS | Status: AC
  Administered 2014-07-23: 240 mg via INTRAVENOUS
  Filled 2014-07-23: qty 20

## 2014-07-23 MED ORDER — CLOTRIMAZOLE-BETAMETHASONE 1-0.05 % EX CREA: 1.0000 "application " | TOPICAL_CREAM | Freq: Two times a day (BID) | CUTANEOUS | Status: AC

## 2014-07-23 MED ORDER — ACYCLOVIR 200 MG PO CAPS: 200.0000 mg | ORAL_CAPSULE | Freq: Every day | ORAL | Status: AC

## 2014-07-23 MED ORDER — SODIUM CHLORIDE 0.9 % IJ SOLN
10.0000 mL | INTRAMUSCULAR | Status: DC | PRN
  Administered 2014-07-23: 10 mL
  Filled 2014-07-23: qty 10

## 2014-07-23 MED ORDER — CYCLOBENZAPRINE HCL 5 MG PO TABS: 5.0000 mg | ORAL_TABLET | Freq: Three times a day (TID) | ORAL | Status: DC | PRN

## 2014-07-23 MED ORDER — HEPARIN SOD (PORK) LOCK FLUSH 100 UNIT/ML IV SOLN
500.0000 [IU] | Freq: Once | INTRAVENOUS | Status: AC | PRN
  Administered 2014-07-23: 500 [IU]
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
## 2014-07-23 NOTE — Patient Instructions (Signed)
[facility] Discharge Instructions for Patients Receiving Chemotherapy  Today you received the following chemotherapy agents Opdivo.  If you develop nausea and vomiting that is not controlled by your nausea medication, call the clinic. If it is after clinic hours your family physician or the after hours number for the clinic or go to the Emergency Department. BELOW ARE SYMPTOMS THAT SHOULD BE REPORTED IMMEDIATELY:  *FEVER GREATER THAN 101.0 F  *CHILLS WITH OR WITHOUT FEVER  NAUSEA AND VOMITING THAT IS NOT CONTROLLED WITH YOUR NAUSEA MEDICATION  *UNUSUAL SHORTNESS OF BREATH  *UNUSUAL BRUISING OR BLEEDING  TENDERNESS IN MOUTH AND THROAT WITH OR WITHOUT PRESENCE OF ULCERS  *URINARY PROBLEMS  *BOWEL PROBLEMS  UNUSUAL RASH Items with * indicate a potential emergency and should be followed up as soon as possible.  Return as scheduled.  I have been informed and understand all the instructions given to me. I know to contact the clinic, my physician, or go to the Emergency Department if any problems should occur. I do not have any questions at this time, but understand that I may call the clinic during office hours or the Patient Navigator at [phone] should I have any questions or need assistance in obtaining follow up care.    __________________________________________  _____________  __________ Signature of Patient or Authorized Representative            Date                   Time    __________________________________________ Nurse's Signature

## 2014-07-23 NOTE — Progress Notes (Signed)
Tolerated chemo well. 

## 2014-07-23 NOTE — Assessment & Plan Note (Addendum)
Stage IV RCC, on single agent Nivolumab with stability of diease.  Will continue to follow TSH and manage Synthroid accordingly.  Pre-chemo labs as ordered.  Return in 2 weeks for follow-up.  On her lower lip, she has HSV 1.  She notes that the lesions began last week, so I suspect we missed our 72 hour window.  Nonetheless, she is symptomatic and therefore, I will treat with Acyclovir 200 mg 5 times daily x 1 week.  On her left lower abdomen, she has a lesion appearing to be fungal in nature.  I will treat with Lotrisone cream BID.  I have refilled Flexeril  Follow-up at [facility] as scheduled on August 1.  In the interim, we will continue treatment until we hear results from restaging imaging at [facility].

## 2014-07-24 ENCOUNTER — Other Ambulatory Visit: Payer: Self-pay | Admitting: Physician Assistant

## 2014-07-25 NOTE — Telephone Encounter (Signed)
Medication refilled per protocol. 

## 2014-08-01 ENCOUNTER — Encounter (HOSPITAL_COMMUNITY): Payer: Self-pay | Admitting: Psychiatry

## 2014-08-01 ENCOUNTER — Ambulatory Visit (INDEPENDENT_AMBULATORY_CARE_PROVIDER_SITE_OTHER): Payer: PPO | Admitting: Psychiatry

## 2014-08-01 VITALS — BP 80/60 | HR 63 | Ht 66.0 in | Wt 176.8 lb

## 2014-08-01 DIAGNOSIS — F329 Major depressive disorder, single episode, unspecified: Secondary | ICD-10-CM

## 2014-08-01 DIAGNOSIS — F418 Other specified anxiety disorders: Secondary | ICD-10-CM

## 2014-08-01 MED ORDER — VILAZODONE HCL 40 MG PO TABS: 40.0000 mg | ORAL_TABLET | Freq: Every day | ORAL | Status: DC
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
## 2014-08-02 ENCOUNTER — Other Ambulatory Visit (HOSPITAL_COMMUNITY): Payer: Self-pay | Admitting: Oncology

## 2014-08-02 DIAGNOSIS — E039 Hypothyroidism, unspecified: Secondary | ICD-10-CM

## 2014-08-02 MED ORDER — LEVOTHYROXINE SODIUM 50 MCG PO TABS: 50.0000 ug | ORAL_TABLET | Freq: Every day | ORAL | Status: DC

## 2014-08-06 ENCOUNTER — Encounter (HOSPITAL_BASED_OUTPATIENT_CLINIC_OR_DEPARTMENT_OTHER): Payer: PPO | Admitting: Oncology

## 2014-08-06 ENCOUNTER — Encounter (HOSPITAL_BASED_OUTPATIENT_CLINIC_OR_DEPARTMENT_OTHER): Payer: PPO

## 2014-08-06 ENCOUNTER — Encounter (HOSPITAL_COMMUNITY): Payer: Self-pay | Admitting: Oncology

## 2014-08-06 VITALS — HR 61 | Temp 98.9°F | Resp 16

## 2014-08-06 VITALS — BP 105/46 | HR 57 | Temp 97.9°F | Resp 18

## 2014-08-06 DIAGNOSIS — C649 Malignant neoplasm of unspecified kidney, except renal pelvis: Secondary | ICD-10-CM

## 2014-08-06 DIAGNOSIS — Z5112 Encounter for antineoplastic immunotherapy: Secondary | ICD-10-CM | POA: Diagnosis not present

## 2014-08-06 DIAGNOSIS — C7800 Secondary malignant neoplasm of unspecified lung: Secondary | ICD-10-CM | POA: Diagnosis not present

## 2014-08-06 DIAGNOSIS — I951 Orthostatic hypotension: Secondary | ICD-10-CM

## 2014-08-06 DIAGNOSIS — I959 Hypotension, unspecified: Secondary | ICD-10-CM

## 2014-08-06 LAB — COMPREHENSIVE METABOLIC PANEL
ALT: 12 U/L — ABNORMAL LOW (ref 14–54)
ANION GAP: 9 (ref 5–15)
AST: 19 U/L (ref 15–41)
Albumin: 4.1 g/dL (ref 3.5–5.0)
Alkaline Phosphatase: 66 U/L (ref 38–126)
BILIRUBIN TOTAL: 0.6 mg/dL (ref 0.3–1.2)
BUN: 32 mg/dL — AB (ref 6–20)
CALCIUM: 9.5 mg/dL (ref 8.9–10.3)
CO2: 22 mmol/L (ref 22–32)
Chloride: 108 mmol/L (ref 101–111)
Creatinine, Ser: 1.84 mg/dL — ABNORMAL HIGH (ref 0.44–1.00)
GFR calc non Af Amer: 28 mL/min — ABNORMAL LOW (ref >60)
GFR, EST AFRICAN AMERICAN: 32 mL/min — AB (ref >60)
GLUCOSE: 123 mg/dL — AB (ref 65–99)
POTASSIUM: 3.9 mmol/L (ref 3.5–5.1)
Sodium: 139 mmol/L (ref 135–145)
Total Protein: 7.4 g/dL (ref 6.5–8.1)

## 2014-08-06 LAB — CBC WITH DIFFERENTIAL/PLATELET
BASOS ABS: 0 10*3/uL (ref 0.0–0.1)
Basophils Relative: 0 % (ref 0–1)
Eosinophils Absolute: 0.2 10*3/uL (ref 0.0–0.7)
Eosinophils Relative: 3 % (ref 0–5)
HEMATOCRIT: 37 % (ref 36.0–46.0)
HEMOGLOBIN: 12.4 g/dL (ref 12.0–15.0)
Lymphocytes Relative: 16 % (ref 12–46)
Lymphs Abs: 1.2 10*3/uL (ref 0.7–4.0)
MCH: 31.2 pg (ref 26.0–34.0)
MCHC: 33.5 g/dL (ref 30.0–36.0)
MCV: 93.2 fL (ref 78.0–100.0)
MONO ABS: 0.3 10*3/uL (ref 0.1–1.0)
Monocytes Relative: 4 % (ref 3–12)
Neutro Abs: 5.9 10*3/uL (ref 1.7–7.7)
Neutrophils Relative %: 77 % (ref 43–77)
PLATELETS: 189 10*3/uL (ref 150–400)
RBC: 3.97 MIL/uL (ref 3.87–5.11)
RDW: 14.8 % (ref 11.5–15.5)
WBC: 7.7 10*3/uL (ref 4.0–10.5)

## 2014-08-06 LAB — TSH: TSH: 1.548 u[IU]/mL (ref 0.350–4.500)

## 2014-08-06 MED ORDER — SODIUM CHLORIDE 0.9 % IV SOLN
3.0000 mg/kg | Freq: Once | INTRAVENOUS | Status: AC
  Administered 2014-08-06: 240 mg via INTRAVENOUS
  Filled 2014-08-06: qty 20

## 2014-08-06 MED ORDER — SODIUM CHLORIDE 0.9 % IV SOLN: Freq: Once | INTRAVENOUS | Status: DC

## 2014-08-06 MED ORDER — HEPARIN SOD (PORK) LOCK FLUSH 100 UNIT/ML IV SOLN
500.0000 [IU] | Freq: Once | INTRAVENOUS | Status: AC | PRN
  Administered 2014-08-06: 500 [IU]
  Filled 2014-08-06: qty 5

## 2014-08-06 MED ORDER — OXYCODONE HCL 5 MG PO TABS: 5.0000 mg | ORAL_TABLET | Freq: Four times a day (QID) | ORAL | Status: DC | PRN

## 2014-08-06 MED ORDER — SODIUM CHLORIDE 0.9 % IV SOLN
INTRAVENOUS | Status: DC
  Administered 2014-08-06: 11:00:00 via INTRAVENOUS

## 2014-08-06 MED ORDER — SODIUM CHLORIDE 0.9 % IJ SOLN: 10.0000 mL | INTRAMUSCULAR | Status: DC | PRN
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
## 2014-08-06 NOTE — Patient Instructions (Signed)
[place] at [facility] Discharge Instructions  RECOMMENDATIONS MADE BY THE CONSULTANT AND ANY TEST RESULTS WILL BE SENT TO YOUR REFERRING PHYSICIAN.  Exam and discussion by Robynn Pane, PA-C Hold your blood pressure med for 3 - 7 days Will give you some additional fluids today Refill for Oxycodone  Follow-up in 2 weeks.  Thank you for choosing [place] at [facility] to provide your oncology and hematology care.  To afford each patient quality time with our provider, please arrive at least 15 minutes before your scheduled appointment time.    You need to re-schedule your appointment should you arrive 10 or more minutes late.  We strive to give you quality time with our providers, and arriving late affects you and other patients whose appointments are after yours.  Also, if you no show three or more times for appointments you may be dismissed from the clinic at the providers discretion.     Again, thank you for choosing [facility].  Our hope is that these requests will decrease the amount of time that you wait before being seen by our physicians.       _____________________________________________________________  Should you have questions after your visit to [facility], please contact our office at [phone] between the hours of 8:30 a.m. and 4:30 p.m.  Voicemails left after 4:30 p.m. will not be returned until the following business day.  For prescription refill requests, have your pharmacy contact our office.

## 2014-08-06 NOTE — Progress Notes (Signed)
Patient tolerated infusion well.  VSS.   

## 2014-08-06 NOTE — Assessment & Plan Note (Addendum)
Stage IV RCC, on single agent Nivolumab with stability of diease.  Will continue to follow TSH and manage Synthroid accordingly.  Pre-chemo labs as ordered.  Continued difficulty with her mortality, diagnosis, and prognosis.  Return in 2 weeks for follow-up after having been seen at [facility] with repeat imaging on 08/13/2014.  I have ordered and additional 1 L NS today with her chemotherapy for hypotension.  She will hold BP medication for ~ 5 days or so.  If BP continues to be low, will need to consider stopping BP medication.  Follow-up at [facility] as scheduled on August 1.  In the interim, we will continue treatment until we hear results from restaging imaging at [place].

## 2014-08-06 NOTE — Patient Instructions (Signed)
..  [facility] Discharge Instructions for Patients Receiving Chemotherapy  Today you received the following chemotherapy agents Opdivo.  To help prevent nausea and vomiting after your treatment, we encourage you to take your nausea medication Zofran '8mg'$  daily Days 2,3,4 post chemo Begin taking it as needed and take it as often as prescribed for the next 48 hours.   If you develop nausea and vomiting, or diarrhea that is not controlled by your medication, call the clinic.  The clinic phone number is [phone]. Office hours are Monday-Friday 8:30am-5:00pm.  BELOW ARE SYMPTOMS THAT SHOULD BE REPORTED IMMEDIATELY:  *FEVER GREATER THAN 101.0 F  *CHILLS WITH OR WITHOUT FEVER  NAUSEA AND VOMITING THAT IS NOT CONTROLLED WITH YOUR NAUSEA MEDICATION  *UNUSUAL SHORTNESS OF BREATH  *UNUSUAL BRUISING OR BLEEDING  TENDERNESS IN MOUTH AND THROAT WITH OR WITHOUT PRESENCE OF ULCERS  *URINARY PROBLEMS  *BOWEL PROBLEMS  UNUSUAL RASH Items with * indicate a potential emergency and should be followed up as soon as possible. If you have an emergency after office hours please contact your primary care physician or go to the nearest emergency department.  Please call the clinic during office hours if you have any questions or concerns.   You may also contact the Patient Navigator at [phone] should you have any questions or need assistance in obtaining follow up care. _____________________________________________________________________ Have you asked about our STAR program?    STAR stands for Survivorship Training and Rehabilitation, and this is a nationally recognized cancer care program that focuses on survivorship and rehabilitation.  Cancer and cancer treatments may cause problems, such as, pain, making you feel tired and keeping you from doing the things that you need or want to do. Cancer rehabilitation can help. Our goal is to reduce these troubling effects and help you  have the best quality of life possible.  You may receive a survey from a nurse that asks questions about your current state of health.  Based on the survey results, all eligible patients will be referred to the [facility] program for an evaluation so we can better serve you! A frequently asked questions sheet is available upon request.

## 2014-08-10 ENCOUNTER — Ambulatory Visit (INDEPENDENT_AMBULATORY_CARE_PROVIDER_SITE_OTHER): Payer: PPO | Admitting: Family Medicine

## 2014-08-10 ENCOUNTER — Encounter: Payer: Self-pay | Admitting: Family Medicine

## 2014-08-10 VITALS — BP 102/58 | HR 68 | Temp 98.1°F | Resp 12 | Ht 66.0 in | Wt 178.0 lb

## 2014-08-10 DIAGNOSIS — B009 Herpesviral infection, unspecified: Secondary | ICD-10-CM

## 2014-08-10 DIAGNOSIS — L089 Local infection of the skin and subcutaneous tissue, unspecified: Secondary | ICD-10-CM | POA: Diagnosis not present

## 2014-08-10 MED ORDER — SULFAMETHOXAZOLE-TRIMETHOPRIM 800-160 MG PO TABS: 1.0000 | ORAL_TABLET | Freq: Two times a day (BID) | ORAL | Status: DC

## 2014-08-10 NOTE — Progress Notes (Signed)
Patient ID: [name] [name], female   DOB: 08/03/1950, 64 y.o.   MRN: [id number]   Subjective:    Patient ID: [name] [name], female    DOB: Jun 03, 1950, 64 y.o.   MRN: [id number]  Patient presents for Lip Issues  patient here with infection to her lower lip which is been present for the past 2 weeks. She was actually seen by her oncologist this is thought to be a severe HSV reaction she was given acyclovir. She took it for a few days but then became nausea and had upset stomach and stopped the medication. Her lip did actually start to clear up per the oncologist noted a few days ago it had improved. She actually went back on the medication earlier this week and has not had any nausea or vomiting. She now has pus coming from some other lesions on her lip and feels like it is spreading to her upper lip.  She is currently getting treatment for metastatic renal cancer  Review Of Systems:  GEN- denies fatigue, fever, weight loss,weakness, recent illness HEENT- denies eye drainage, change in vision, nasal discharge, CVS- denies chest pain, palpitations RESP- denies SOB, cough, wheeze Neuro- denies headache, dizziness, syncope, seizure activity       Objective:    BP 102/58 mmHg  Pulse 68  Temp(Src) 98.1 F (36.7 C) (Oral)  Resp 12  Ht '5\' 6"'$  (1.676 m)  Wt 178 lb (80.74 kg)  BMI 28.74 kg/m2 GEN- NAD, alert and oriented x3 HEENT- PERRL, EOMI, non injected sclera, pink conjunctiva, MMM, oropharynx clear, lower lip- multiple blistering lesion ,some with yellow pus, large scab from previous bleed-hair and debri mixed into scabs across lower lip, TTP, small blister left upper lip  Neck- Supple, no LAD         Assessment & Plan:      Problem List Items Addressed This Visit    None    Visit Diagnoses    Skin infection    -  Primary    I think the lesions on her lip are HSV related but now they're superinfected. I'm a half for complete acyclovir, some of the hair and debris was removed from  some of the blisters. I took cultures of lesions, will also add bactrim DS for 7 days. She is going to [facility] on Monday for oncology    Relevant Medications    acyclovir (ZOVIRAX) 200 MG capsule    sulfamethoxazole-trimethoprim (BACTRIM DS,SEPTRA DS) 800-160 MG per tablet    Other Relevant Orders    Wound culture    Herpes simplex virus culture    Herpes simplex type 1 infection        Relevant Medications    acyclovir (ZOVIRAX) 200 MG capsule    sulfamethoxazole-trimethoprim (BACTRIM DS,SEPTRA DS) 800-160 MG per tablet       Note: This dictation was prepared with Dragon dictation along with smaller phrase technology. Any transcriptional errors that result from this process are unintentional.

## 2014-08-10 NOTE — Patient Instructions (Addendum)
Take antibiotics by mouth Okay to use vaseline Use acyclovir  Bacterial culture and HSV viral culture performed F/U as needed

## 2014-08-13 LAB — WOUND CULTURE: GRAM STAIN: NONE SEEN

## 2014-08-14 LAB — HERPES SIMPLEX VIRUS CULTURE: ORGANISM ID, BACTERIA: NOT DETECTED

## 2014-08-16 ENCOUNTER — Ambulatory Visit (INDEPENDENT_AMBULATORY_CARE_PROVIDER_SITE_OTHER): Payer: PPO | Admitting: Psychiatry

## 2014-08-16 DIAGNOSIS — F418 Other specified anxiety disorders: Secondary | ICD-10-CM | POA: Diagnosis not present

## 2014-08-16 NOTE — Patient Instructions (Signed)
Discussed orally 

## 2014-08-16 NOTE — Progress Notes (Signed)
         THERAPIST PROGRESS NOTE  Session Time: Thursday 1:35 PM -2:28 PM  Participation Level: Active  Behavioral Response: CasualAlert/ anxious, depressed,tearful  Type of Therapy: Individual Therapy  Treatment Goals addressed: Improve ability to manage stress and transitions with decreased irritability and feelings of being overwhelmed  Interventions: ACT, Supportive  Summary: [name] [name] is a 64 y.o. female who presents with symptoms of depression and anxiety that have been intermittent for several years that have been well controlled until patient was diagnosed with cancer in 2013. Symptoms have worsened in recent months as patient reports constantly thinking about her diagnoses and fears cancer will spread. She has been given a prognosis of 2-4 years to live. Patient reports taking chemotherapy pills and reports extreme fatigue. Patient also experiences depression, anxiety, and crying spells  Patient reports going to see oncologist in [place] since last session. She is thankful tests indicate medication has slowed the growth of cancer cells and she does not have to return to [facility] for 3 months. However she states she continues to have crying spells daily and can't laugh or enjoy life. She states the initial 2-3 year prognosis she was given when initially diagnosed with cancer has expired. This along with a fellow church member and cancer patient dying earlier this week has triggered more thoughts about death.She states despising the thought of dying.  Patient expresses frustration her sister and others seem to minimize death and say things to her like anybody could die at any time. She also expresses sadness and frustration that children and other family members will not talk to her about what she is going through and spend little time with her. Patient admits feeling alone. She reports disliking attending cancer support groups because people don't really listen to her or  talk about death.  She also continues to fear husband will die before she does and easily becomes upset when he appears not to feel well. She denies any suicidal ideations. Suicidal/Homicidal: No    Therapist Response: Therapist works with patient to identify and verbalize feelings, identify how the diagnosis of cancer may have affected her interaction with her family, identify ways to communicate concerns and express feelings to family, discuss possibility of including husband in next session, discuss patient's feelings and thoughts about dying,   Plan: Patient agrees to return for an appointment in 3-4 weeks    Diagnosis: Axis I: Depressive Disorder NOS    Axis II: Deferred    [place], LCSW 08/16/2014

## 2014-08-20 ENCOUNTER — Encounter (HOSPITAL_COMMUNITY): Payer: Self-pay | Admitting: Hematology & Oncology

## 2014-08-20 ENCOUNTER — Encounter (HOSPITAL_BASED_OUTPATIENT_CLINIC_OR_DEPARTMENT_OTHER): Payer: PPO | Admitting: Hematology & Oncology

## 2014-08-20 ENCOUNTER — Encounter (HOSPITAL_COMMUNITY): Payer: PPO | Attending: Hematology and Oncology

## 2014-08-20 VITALS — BP 109/47 | HR 64 | Temp 98.0°F | Resp 18 | Wt 177.4 lb

## 2014-08-20 VITALS — BP 100/57 | HR 61 | Temp 97.8°F | Resp 18

## 2014-08-20 DIAGNOSIS — C649 Malignant neoplasm of unspecified kidney, except renal pelvis: Secondary | ICD-10-CM

## 2014-08-20 DIAGNOSIS — F329 Major depressive disorder, single episode, unspecified: Secondary | ICD-10-CM | POA: Diagnosis not present

## 2014-08-20 DIAGNOSIS — K219 Gastro-esophageal reflux disease without esophagitis: Secondary | ICD-10-CM | POA: Insufficient documentation

## 2014-08-20 DIAGNOSIS — R5383 Other fatigue: Secondary | ICD-10-CM | POA: Diagnosis present

## 2014-08-20 DIAGNOSIS — R918 Other nonspecific abnormal finding of lung field: Secondary | ICD-10-CM | POA: Insufficient documentation

## 2014-08-20 DIAGNOSIS — F32A Depression, unspecified: Secondary | ICD-10-CM

## 2014-08-20 DIAGNOSIS — Z5112 Encounter for antineoplastic immunotherapy: Secondary | ICD-10-CM

## 2014-08-20 DIAGNOSIS — C7800 Secondary malignant neoplasm of unspecified lung: Secondary | ICD-10-CM

## 2014-08-20 DIAGNOSIS — E860 Dehydration: Secondary | ICD-10-CM

## 2014-08-20 LAB — CBC WITH DIFFERENTIAL/PLATELET
BASOS ABS: 0 10*3/uL (ref 0.0–0.1)
BASOS PCT: 0 % (ref 0–1)
EOS ABS: 0.3 10*3/uL (ref 0.0–0.7)
Eosinophils Relative: 4 % (ref 0–5)
HCT: 37.6 % (ref 36.0–46.0)
Hemoglobin: 12.6 g/dL (ref 12.0–15.0)
LYMPHS PCT: 22 % (ref 12–46)
Lymphs Abs: 1.6 10*3/uL (ref 0.7–4.0)
MCH: 31.7 pg (ref 26.0–34.0)
MCHC: 33.5 g/dL (ref 30.0–36.0)
MCV: 94.7 fL (ref 78.0–100.0)
MONO ABS: 0.4 10*3/uL (ref 0.1–1.0)
Monocytes Relative: 6 % (ref 3–12)
Neutro Abs: 4.9 10*3/uL (ref 1.7–7.7)
Neutrophils Relative %: 68 % (ref 43–77)
Platelets: 201 10*3/uL (ref 150–400)
RBC: 3.97 MIL/uL (ref 3.87–5.11)
RDW: 15 % (ref 11.5–15.5)
WBC: 7.3 10*3/uL (ref 4.0–10.5)

## 2014-08-20 LAB — TSH: TSH: 2.418 u[IU]/mL (ref 0.350–4.500)

## 2014-08-20 LAB — COMPREHENSIVE METABOLIC PANEL
ALT: 11 U/L — ABNORMAL LOW (ref 14–54)
AST: 19 U/L (ref 15–41)
Albumin: 4.2 g/dL (ref 3.5–5.0)
Alkaline Phosphatase: 71 U/L (ref 38–126)
Anion gap: 8 (ref 5–15)
BUN: 32 mg/dL — ABNORMAL HIGH (ref 6–20)
CO2: 22 mmol/L (ref 22–32)
Calcium: 9.7 mg/dL (ref 8.9–10.3)
Chloride: 106 mmol/L (ref 101–111)
Creatinine, Ser: 2.06 mg/dL — ABNORMAL HIGH (ref 0.44–1.00)
GFR calc Af Amer: 28 mL/min — ABNORMAL LOW (ref >60)
GFR calc non Af Amer: 24 mL/min — ABNORMAL LOW (ref >60)
Glucose, Bld: 108 mg/dL — ABNORMAL HIGH (ref 65–99)
Potassium: 4.9 mmol/L (ref 3.5–5.1)
SODIUM: 136 mmol/L (ref 135–145)
TOTAL PROTEIN: 7.6 g/dL (ref 6.5–8.1)
Total Bilirubin: 0.5 mg/dL (ref 0.3–1.2)

## 2014-08-20 MED ORDER — SODIUM CHLORIDE 0.9 % IV SOLN
3.0000 mg/kg | Freq: Once | INTRAVENOUS | Status: AC
  Administered 2014-08-20: 240 mg via INTRAVENOUS
  Filled 2014-08-20: qty 20

## 2014-08-20 MED ORDER — SODIUM CHLORIDE 0.9 % IV SOLN: Freq: Once | INTRAVENOUS | Status: DC

## 2014-08-20 MED ORDER — ACYCLOVIR 200 MG PO CAPS: 200.0000 mg | ORAL_CAPSULE | Freq: Every day | ORAL | Status: DC

## 2014-08-20 MED ORDER — SODIUM CHLORIDE 0.9 % IV SOLN
INTRAVENOUS | Status: DC
  Administered 2014-08-20: 11:00:00 via INTRAVENOUS

## 2014-08-20 MED ORDER — SODIUM CHLORIDE 0.9 % IJ SOLN
10.0000 mL | INTRAMUSCULAR | Status: DC | PRN
  Administered 2014-08-20: 10 mL
  Filled 2014-08-20: qty 10

## 2014-08-20 MED ORDER — HEPARIN SOD (PORK) LOCK FLUSH 100 UNIT/ML IV SOLN
500.0000 [IU] | Freq: Once | INTRAVENOUS | Status: AC | PRN
  Administered 2014-08-20: 500 [IU]
  Filled 2014-08-20: qty 5
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
## 2014-08-20 NOTE — Progress Notes (Signed)
Tolerated IV fluids and Opdivo infusion well.

## 2014-08-20 NOTE — Patient Instructions (Signed)
[place] at [facility] Discharge Instructions  RECOMMENDATIONS MADE BY THE CONSULTANT AND ANY TEST RESULTS WILL BE SENT TO YOUR REFERRING PHYSICIAN.  Exam completed by Dr Whitney Muse today Chemotherapy as scheduled today Return in 2 weeks for chemotherapy. Return in 4 weeks to see the doctor Please call the clinic if you have any questions or concerns  Thank you for choosing [place] at [facility] to provide your oncology and hematology care.  To afford each patient quality time with our provider, please arrive at least 15 minutes before your scheduled appointment time.    You need to re-schedule your appointment should you arrive 10 or more minutes late.  We strive to give you quality time with our providers, and arriving late affects you and other patients whose appointments are after yours.  Also, if you no show three or more times for appointments you may be dismissed from the clinic at the providers discretion.     Again, thank you for choosing [facility].  Our hope is that these requests will decrease the amount of time that you wait before being seen by our physicians.       _____________________________________________________________  Should you have questions after your visit to [facility], please contact our office at [phone] between the hours of 8:30 a.m. and 4:30 p.m.  Voicemails left after 4:30 p.m. will not be returned until the following business day.  For prescription refill requests, have your pharmacy contact our office.

## 2014-08-23 ENCOUNTER — Telehealth (HOSPITAL_COMMUNITY): Payer: Self-pay | Admitting: *Deleted

## 2014-08-23 NOTE — Telephone Encounter (Signed)
noted 

## 2014-08-23 NOTE — Telephone Encounter (Signed)
Prior authorization received for Ambien. Called [phone] was told to submit through cover my meds. Submitted online and awaiting decision to be faxed.

## 2014-09-03 ENCOUNTER — Encounter (HOSPITAL_BASED_OUTPATIENT_CLINIC_OR_DEPARTMENT_OTHER): Payer: PPO

## 2014-09-03 VITALS — BP 110/62 | HR 58 | Temp 98.0°F | Resp 16 | Wt 176.0 lb

## 2014-09-03 DIAGNOSIS — E119 Type 2 diabetes mellitus without complications: Secondary | ICD-10-CM | POA: Insufficient documentation

## 2014-09-03 DIAGNOSIS — Z5112 Encounter for antineoplastic immunotherapy: Secondary | ICD-10-CM

## 2014-09-03 DIAGNOSIS — C7800 Secondary malignant neoplasm of unspecified lung: Secondary | ICD-10-CM

## 2014-09-03 DIAGNOSIS — K219 Gastro-esophageal reflux disease without esophagitis: Secondary | ICD-10-CM | POA: Insufficient documentation

## 2014-09-03 DIAGNOSIS — E7800 Pure hypercholesterolemia, unspecified: Secondary | ICD-10-CM | POA: Insufficient documentation

## 2014-09-03 DIAGNOSIS — C649 Malignant neoplasm of unspecified kidney, except renal pelvis: Secondary | ICD-10-CM | POA: Diagnosis not present

## 2014-09-03 DIAGNOSIS — O09A0 Supervision of pregnancy with history of molar pregnancy, unspecified trimester: Secondary | ICD-10-CM | POA: Insufficient documentation

## 2014-09-03 LAB — COMPREHENSIVE METABOLIC PANEL
ALBUMIN: 4.3 g/dL (ref 3.5–5.0)
ALT: 11 U/L — ABNORMAL LOW (ref 14–54)
AST: 21 U/L (ref 15–41)
Alkaline Phosphatase: 75 U/L (ref 38–126)
Anion gap: 8 (ref 5–15)
BUN: 38 mg/dL — ABNORMAL HIGH (ref 6–20)
CO2: 21 mmol/L — AB (ref 22–32)
Calcium: 9.8 mg/dL (ref 8.9–10.3)
Chloride: 109 mmol/L (ref 101–111)
Creatinine, Ser: 2.02 mg/dL — ABNORMAL HIGH (ref 0.44–1.00)
GFR calc Af Amer: 29 mL/min — ABNORMAL LOW (ref >60)
GFR calc non Af Amer: 25 mL/min — ABNORMAL LOW (ref >60)
GLUCOSE: 122 mg/dL — AB (ref 65–99)
POTASSIUM: 4.3 mmol/L (ref 3.5–5.1)
Sodium: 138 mmol/L (ref 135–145)
TOTAL PROTEIN: 7.7 g/dL (ref 6.5–8.1)
Total Bilirubin: 0.5 mg/dL (ref 0.3–1.2)

## 2014-09-03 LAB — TSH: TSH: 1.952 u[IU]/mL (ref 0.350–4.500)

## 2014-09-03 LAB — CBC WITH DIFFERENTIAL/PLATELET
BASOS PCT: 0 % (ref 0–1)
Basophils Absolute: 0 10*3/uL (ref 0.0–0.1)
EOS ABS: 0.2 10*3/uL (ref 0.0–0.7)
EOS PCT: 3 % (ref 0–5)
HCT: 37.9 % (ref 36.0–46.0)
Hemoglobin: 13 g/dL (ref 12.0–15.0)
Lymphocytes Relative: 17 % (ref 12–46)
Lymphs Abs: 1.2 10*3/uL (ref 0.7–4.0)
MCH: 32.1 pg (ref 26.0–34.0)
MCHC: 34.3 g/dL (ref 30.0–36.0)
MCV: 93.6 fL (ref 78.0–100.0)
MONO ABS: 0.5 10*3/uL (ref 0.1–1.0)
MONOS PCT: 7 % (ref 3–12)
Neutro Abs: 5.1 10*3/uL (ref 1.7–7.7)
Neutrophils Relative %: 73 % (ref 43–77)
Platelets: 200 10*3/uL (ref 150–400)
RBC: 4.05 MIL/uL (ref 3.87–5.11)
RDW: 14.5 % (ref 11.5–15.5)
WBC: 7 10*3/uL (ref 4.0–10.5)

## 2014-09-03 MED ORDER — SODIUM CHLORIDE 0.9 % IV SOLN
3.0000 mg/kg | Freq: Once | INTRAVENOUS | Status: AC
  Administered 2014-09-03: 240 mg via INTRAVENOUS
  Filled 2014-09-03: qty 8

## 2014-09-03 MED ORDER — SODIUM CHLORIDE 0.9 % IV SOLN
Freq: Once | INTRAVENOUS | Status: AC
  Administered 2014-09-03: 11:00:00 via INTRAVENOUS

## 2014-09-03 MED ORDER — HEPARIN SOD (PORK) LOCK FLUSH 100 UNIT/ML IV SOLN
500.0000 [IU] | Freq: Once | INTRAVENOUS | Status: AC | PRN
  Administered 2014-09-03: 500 [IU]
  Filled 2014-09-03: qty 5

## 2014-09-03 MED ORDER — SODIUM CHLORIDE 0.9 % IJ SOLN: 10.0000 mL | INTRAMUSCULAR | Status: DC | PRN

## 2014-09-03 NOTE — Patient Instructions (Signed)
..  [facility] Discharge Instructions for Patients Receiving Chemotherapy  Today you received the following chemotherapy agents Opdivo.    To help prevent nausea and vomiting after your treatment, we encourage you to take your nausea medication Zofran '8mg'$  daily Days 2,3,4 post chemo Begin taking it as needed and take it as often as prescribed for the next 48 hours.   If you develop nausea and vomiting, or diarrhea that is not controlled by your medication, call the clinic.  The clinic phone number is [phone]. Office hours are Monday-Friday 8:30am-5:00pm.  BELOW ARE SYMPTOMS THAT SHOULD BE REPORTED IMMEDIATELY:  *FEVER GREATER THAN 101.0 F  *CHILLS WITH OR WITHOUT FEVER  NAUSEA AND VOMITING THAT IS NOT CONTROLLED WITH YOUR NAUSEA MEDICATION  *UNUSUAL SHORTNESS OF BREATH  *UNUSUAL BRUISING OR BLEEDING  TENDERNESS IN MOUTH AND THROAT WITH OR WITHOUT PRESENCE OF ULCERS  *URINARY PROBLEMS  *BOWEL PROBLEMS  UNUSUAL RASH Items with * indicate a potential emergency and should be followed up as soon as possible. If you have an emergency after office hours please contact your primary care physician or go to the nearest emergency department.  Please call the clinic during office hours if you have any questions or concerns.   You may also contact the Patient Navigator at [phone] should you have any questions or need assistance in obtaining follow up care. _____________________________________________________________________ Have you asked about our STAR program?    STAR stands for Survivorship Training and Rehabilitation, and this is a nationally recognized cancer care program that focuses on survivorship and rehabilitation.  Cancer and cancer treatments may cause problems, such as, pain, making you feel tired and keeping you from doing the things that you need or want to do. Cancer rehabilitation can help. Our goal is to reduce these troubling effects and help  you have the best quality of life possible.  You may receive a survey from a nurse that asks questions about your current state of health.  Based on the survey results, all eligible patients will be referred to the [facility] program for an evaluation so we can better serve you! A frequently asked questions sheet is available upon request.

## 2014-09-04 ENCOUNTER — Other Ambulatory Visit: Payer: Self-pay | Admitting: Physician Assistant

## 2014-09-04 NOTE — Telephone Encounter (Signed)
Medication refilled per protocol. 

## 2014-09-06 ENCOUNTER — Encounter (HOSPITAL_COMMUNITY): Payer: Self-pay | Admitting: Psychiatry

## 2014-09-06 ENCOUNTER — Ambulatory Visit (INDEPENDENT_AMBULATORY_CARE_PROVIDER_SITE_OTHER): Payer: PPO | Admitting: Psychiatry

## 2014-09-06 DIAGNOSIS — F418 Other specified anxiety disorders: Secondary | ICD-10-CM | POA: Diagnosis not present

## 2014-09-06 NOTE — Progress Notes (Signed)
         THERAPIST PROGRESS NOTE  Session Time: Thursday 09/06/2014 1:05 PM - 2:00 PM  Participation Level: Active  Behavioral Response: CasualAlert/ anxious, depressed,tearful  Type of Therapy: Individual Therapy  Treatment Goals addressed: Improve ability to manage stress and transitions with decreased irritability and feelings of being overwhelmed  Interventions: ACT, Supportive  Summary: [name] [name] is a 64 y.o. female who presents with symptoms of depression and anxiety that have been intermittent for several years that have been well controlled until patient was diagnosed with cancer in 2013. Symptoms have worsened in recent months as patient reports constantly thinking about her diagnoses and fears cancer will spread. She has been given a prognosis of 2-4 years to live. Patient reports taking chemotherapy pills and reports extreme fatigue. Patient also experiences depression, anxiety, and crying spells  Patient's husband accompanies her to appointment today. Both report continued excessive arguing in their relationship since last session. Patient reports continued crying spells, excessive worry, and depressed mood. Husband reports patient also exhibits increased irritability and negative thinking. Patient continues to complain about family not spending time with her and admits being lonely and afraid.    Suicidal/Homicidal: No    Therapist Response: Therapist works with patient and husband to identify ways to improve communication in their relationship, to identify and verbalize feelings about patient's diagnosis of cancer, discuss with patient the effects of avoiding verbalizing feelings, discuss the connection between thought patterns, mood, and behavior, identify ways to reframe negative thought patterns,    Plan: Patient agrees to return for an appointment in 3-4 weeks    Diagnosis: Axis I: Depressive Disorder NOS    Axis II: Deferred    [place],  [place] 09/06/2014

## 2014-09-06 NOTE — Patient Instructions (Signed)
Discussed orally 

## 2014-09-10 ENCOUNTER — Other Ambulatory Visit (HOSPITAL_COMMUNITY): Payer: Self-pay | Admitting: Psychiatry

## 2014-09-12 ENCOUNTER — Encounter (HOSPITAL_COMMUNITY): Payer: Self-pay | Admitting: Psychiatry

## 2014-09-12 ENCOUNTER — Ambulatory Visit (INDEPENDENT_AMBULATORY_CARE_PROVIDER_SITE_OTHER): Payer: PPO | Admitting: Psychiatry

## 2014-09-12 VITALS — BP 100/62 | Ht 66.0 in | Wt 176.0 lb

## 2014-09-12 DIAGNOSIS — F418 Other specified anxiety disorders: Secondary | ICD-10-CM | POA: Diagnosis not present

## 2014-09-12 MED ORDER — VILAZODONE HCL 40 MG PO TABS: 40.0000 mg | ORAL_TABLET | Freq: Every day | ORAL | Status: DC

## 2014-09-12 MED ORDER — DIAZEPAM 10 MG PO TABS: 10.0000 mg | ORAL_TABLET | Freq: Four times a day (QID) | ORAL | Status: DC

## 2014-09-12 MED ORDER — ZOLPIDEM TARTRATE 5 MG PO TABS: 5.0000 mg | ORAL_TABLET | Freq: Every evening | ORAL | Status: DC | PRN
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
## 2014-09-13 ENCOUNTER — Ambulatory Visit (HOSPITAL_COMMUNITY): Payer: Self-pay | Admitting: Psychiatry

## 2014-09-17 ENCOUNTER — Other Ambulatory Visit: Payer: Self-pay | Admitting: Physician Assistant

## 2014-09-18 ENCOUNTER — Encounter (HOSPITAL_BASED_OUTPATIENT_CLINIC_OR_DEPARTMENT_OTHER): Payer: PPO

## 2014-09-18 ENCOUNTER — Encounter (HOSPITAL_COMMUNITY): Payer: PPO | Attending: Hematology and Oncology | Admitting: Hematology & Oncology

## 2014-09-18 ENCOUNTER — Telehealth (HOSPITAL_COMMUNITY): Payer: Self-pay | Admitting: *Deleted

## 2014-09-18 ENCOUNTER — Encounter (HOSPITAL_COMMUNITY): Payer: Self-pay | Admitting: Hematology & Oncology

## 2014-09-18 VITALS — BP 116/53 | HR 58 | Temp 97.8°F | Resp 16

## 2014-09-18 DIAGNOSIS — C7801 Secondary malignant neoplasm of right lung: Secondary | ICD-10-CM

## 2014-09-18 DIAGNOSIS — R918 Other nonspecific abnormal finding of lung field: Secondary | ICD-10-CM | POA: Diagnosis present

## 2014-09-18 DIAGNOSIS — C649 Malignant neoplasm of unspecified kidney, except renal pelvis: Secondary | ICD-10-CM

## 2014-09-18 DIAGNOSIS — C7802 Secondary malignant neoplasm of left lung: Secondary | ICD-10-CM

## 2014-09-18 DIAGNOSIS — E039 Hypothyroidism, unspecified: Secondary | ICD-10-CM

## 2014-09-18 DIAGNOSIS — R5383 Other fatigue: Secondary | ICD-10-CM | POA: Diagnosis present

## 2014-09-18 DIAGNOSIS — Z5112 Encounter for antineoplastic immunotherapy: Secondary | ICD-10-CM

## 2014-09-18 DIAGNOSIS — K219 Gastro-esophageal reflux disease without esophagitis: Secondary | ICD-10-CM | POA: Insufficient documentation

## 2014-09-18 LAB — COMPREHENSIVE METABOLIC PANEL
ALBUMIN: 3.9 g/dL (ref 3.5–5.0)
ALK PHOS: 74 U/L (ref 38–126)
ALT: 12 U/L — ABNORMAL LOW (ref 14–54)
ANION GAP: 8 (ref 5–15)
AST: 20 U/L (ref 15–41)
BUN: 23 mg/dL — ABNORMAL HIGH (ref 6–20)
CHLORIDE: 109 mmol/L (ref 101–111)
CO2: 23 mmol/L (ref 22–32)
Calcium: 9.5 mg/dL (ref 8.9–10.3)
Creatinine, Ser: 1.44 mg/dL — ABNORMAL HIGH (ref 0.44–1.00)
GFR calc non Af Amer: 37 mL/min — ABNORMAL LOW (ref >60)
GFR, EST AFRICAN AMERICAN: 43 mL/min — AB (ref >60)
GLUCOSE: 112 mg/dL — AB (ref 65–99)
Potassium: 3.9 mmol/L (ref 3.5–5.1)
SODIUM: 140 mmol/L (ref 135–145)
Total Bilirubin: 0.3 mg/dL (ref 0.3–1.2)
Total Protein: 7.5 g/dL (ref 6.5–8.1)

## 2014-09-18 LAB — CBC WITH DIFFERENTIAL/PLATELET
BASOS ABS: 0 10*3/uL (ref 0.0–0.1)
Basophils Relative: 0 % (ref 0–1)
Eosinophils Absolute: 0.3 10*3/uL (ref 0.0–0.7)
Eosinophils Relative: 4 % (ref 0–5)
HEMATOCRIT: 35.7 % — AB (ref 36.0–46.0)
Hemoglobin: 12.1 g/dL (ref 12.0–15.0)
LYMPHS PCT: 23 % (ref 12–46)
Lymphs Abs: 1.5 10*3/uL (ref 0.7–4.0)
MCH: 31.7 pg (ref 26.0–34.0)
MCHC: 33.9 g/dL (ref 30.0–36.0)
MCV: 93.5 fL (ref 78.0–100.0)
MONO ABS: 0.4 10*3/uL (ref 0.1–1.0)
MONOS PCT: 6 % (ref 3–12)
NEUTROS ABS: 4.3 10*3/uL (ref 1.7–7.7)
Neutrophils Relative %: 67 % (ref 43–77)
Platelets: 180 10*3/uL (ref 150–400)
RBC: 3.82 MIL/uL — ABNORMAL LOW (ref 3.87–5.11)
RDW: 14.4 % (ref 11.5–15.5)
WBC: 6.4 10*3/uL (ref 4.0–10.5)

## 2014-09-18 LAB — TSH: TSH: 1.036 u[IU]/mL (ref 0.350–4.500)

## 2014-09-18 MED ORDER — SODIUM CHLORIDE 0.9 % IV SOLN
3.0000 mg/kg | Freq: Once | INTRAVENOUS | Status: AC
  Administered 2014-09-18: 240 mg via INTRAVENOUS
  Filled 2014-09-18: qty 20

## 2014-09-18 MED ORDER — HEPARIN SOD (PORK) LOCK FLUSH 100 UNIT/ML IV SOLN
INTRAVENOUS | Status: AC
  Filled 2014-09-18: qty 5

## 2014-09-18 MED ORDER — SODIUM CHLORIDE 0.9 % IJ SOLN
10.0000 mL | INTRAMUSCULAR | Status: DC | PRN
  Administered 2014-09-18: 10 mL
  Filled 2014-09-18: qty 10

## 2014-09-18 MED ORDER — SODIUM CHLORIDE 0.9 % IV SOLN
Freq: Once | INTRAVENOUS | Status: AC
  Administered 2014-09-18: 10:00:00 via INTRAVENOUS

## 2014-09-18 MED ORDER — HEPARIN SOD (PORK) LOCK FLUSH 100 UNIT/ML IV SOLN
500.0000 [IU] | Freq: Once | INTRAVENOUS | Status: AC | PRN
  Administered 2014-09-18: 500 [IU]

## 2014-09-18 MED ORDER — VALACYCLOVIR HCL 500 MG PO TABS: ORAL_TABLET | ORAL | Status: DC

## 2014-09-18 NOTE — Progress Notes (Signed)
Tolerated antibody therapy well.

## 2014-09-18 NOTE — Telephone Encounter (Signed)
PATIENT RETURNED YOUR PHONE CALL.

## 2014-09-18 NOTE — Telephone Encounter (Signed)
Medication refilled per protocol. 

## 2014-09-18 NOTE — Patient Instructions (Signed)
[facility] Discharge Instructions for Patients Receiving Chemotherapy  Today you received the following chemotherapy agents Opdivo. To help prevent nausea and vomiting after your treatment, we encourage you to take your nausea medication as instructed. If you develop nausea and vomiting that is not controlled by your nausea medication, call the clinic. If it is after clinic hours your family physician or the after hours number for the clinic or go to the Emergency Department.  BELOW ARE SYMPTOMS THAT SHOULD BE REPORTED IMMEDIATELY:  *FEVER GREATER THAN 101.0 F  *CHILLS WITH OR WITHOUT FEVER  NAUSEA AND VOMITING THAT IS NOT CONTROLLED WITH YOUR NAUSEA MEDICATION  *UNUSUAL SHORTNESS OF BREATH  *UNUSUAL BRUISING OR BLEEDING  TENDERNESS IN MOUTH AND THROAT WITH OR WITHOUT PRESENCE OF ULCERS  *URINARY PROBLEMS  *BOWEL PROBLEMS  UNUSUAL RASH Items with * indicate a potential emergency and should be followed up as soon as possible.  Return as scheduled.  I have been informed and understand all the instructions given to me. I know to contact the clinic, my physician, or go to the Emergency Department if any problems should occur. I do not have any questions at this time, but understand that I may call the clinic during office hours or the Patient Navigator at [phone] should I have any questions or need assistance in obtaining follow up care.    __________________________________________  _____________  __________ Signature of Patient or Authorized Representative            Date                   Time    __________________________________________ Nurse's Signature  

## 2014-09-18 NOTE — Patient Instructions (Signed)
[place] at [facility] Discharge Instructions  RECOMMENDATIONS MADE BY THE CONSULTANT AND ANY TEST RESULTS WILL BE SENT TO YOUR REFERRING PHYSICIAN.  Exam and discussion by Dr. Whitney Muse. Will prescribe Valtrex as a preventative.  You need to take it once daily as a preventative and twice daily if you have an outbreak. Report uncontrolled nausea, vomiting or other concerns.  Chemo every 2 weeks and office visit in 4 weeks.  Thank you for choosing [place] at [facility] to provide your oncology and hematology care.  To afford each patient quality time with our provider, please arrive at least 15 minutes before your scheduled appointment time.    You need to re-schedule your appointment should you arrive 10 or more minutes late.  We strive to give you quality time with our providers, and arriving late affects you and other patients whose appointments are after yours.  Also, if you no show three or more times for appointments you may be dismissed from the clinic at the providers discretion.     Again, thank you for choosing [facility].  Our hope is that these requests will decrease the amount of time that you wait before being seen by our physicians.       _____________________________________________________________  Should you have questions after your visit to [facility], please contact our office at [phone] between the hours of 8:30 a.m. and 4:30 p.m.  Voicemails left after 4:30 p.m. will not be returned until the following business day.  For prescription refill requests, have your pharmacy contact our office.
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
## 2014-09-19 NOTE — Telephone Encounter (Signed)
[email or web address]

## 2014-09-25 ENCOUNTER — Telehealth (HOSPITAL_COMMUNITY): Payer: Self-pay | Admitting: *Deleted

## 2014-09-25 NOTE — Telephone Encounter (Signed)
Pt pharmacy requesting refills for pt Lexapro. Called pt to see if she is still taking this medication due to not seeing pt this medication of pt medication list. Per pt, she do not take it because she do not see it on her list of medications at home.

## 2014-09-26 NOTE — Telephone Encounter (Signed)
Spoke with pt and issues was fixed.

## 2014-10-02 ENCOUNTER — Other Ambulatory Visit (HOSPITAL_COMMUNITY): Payer: Self-pay | Admitting: Oncology

## 2014-10-02 ENCOUNTER — Encounter (HOSPITAL_BASED_OUTPATIENT_CLINIC_OR_DEPARTMENT_OTHER): Payer: PPO

## 2014-10-02 VITALS — BP 112/54 | HR 63 | Temp 98.1°F | Resp 16 | Wt 183.0 lb

## 2014-10-02 DIAGNOSIS — C649 Malignant neoplasm of unspecified kidney, except renal pelvis: Secondary | ICD-10-CM | POA: Diagnosis not present

## 2014-10-02 DIAGNOSIS — C7800 Secondary malignant neoplasm of unspecified lung: Secondary | ICD-10-CM

## 2014-10-02 DIAGNOSIS — Z5112 Encounter for antineoplastic immunotherapy: Secondary | ICD-10-CM | POA: Diagnosis not present

## 2014-10-02 LAB — COMPREHENSIVE METABOLIC PANEL
ALT: 11 U/L — AB (ref 14–54)
AST: 26 U/L (ref 15–41)
Albumin: 3.7 g/dL (ref 3.5–5.0)
Alkaline Phosphatase: 64 U/L (ref 38–126)
Anion gap: 8 (ref 5–15)
BUN: 21 mg/dL — ABNORMAL HIGH (ref 6–20)
CHLORIDE: 112 mmol/L — AB (ref 101–111)
CO2: 23 mmol/L (ref 22–32)
CREATININE: 1.31 mg/dL — AB (ref 0.44–1.00)
Calcium: 9.3 mg/dL (ref 8.9–10.3)
GFR calc non Af Amer: 42 mL/min — ABNORMAL LOW (ref >60)
GFR, EST AFRICAN AMERICAN: 48 mL/min — AB (ref >60)
Glucose, Bld: 129 mg/dL — ABNORMAL HIGH (ref 65–99)
POTASSIUM: 3.9 mmol/L (ref 3.5–5.1)
SODIUM: 143 mmol/L (ref 135–145)
Total Bilirubin: 0.6 mg/dL (ref 0.3–1.2)
Total Protein: 6.9 g/dL (ref 6.5–8.1)

## 2014-10-02 LAB — CBC WITH DIFFERENTIAL/PLATELET
BASOS ABS: 0 10*3/uL (ref 0.0–0.1)
BASOS PCT: 0 %
EOS ABS: 0.3 10*3/uL (ref 0.0–0.7)
EOS PCT: 5 %
HCT: 34.3 % — ABNORMAL LOW (ref 36.0–46.0)
Hemoglobin: 11.5 g/dL — ABNORMAL LOW (ref 12.0–15.0)
Lymphocytes Relative: 22 %
Lymphs Abs: 1.2 10*3/uL (ref 0.7–4.0)
MCH: 31.7 pg (ref 26.0–34.0)
MCHC: 33.5 g/dL (ref 30.0–36.0)
MCV: 94.5 fL (ref 78.0–100.0)
MONO ABS: 0.3 10*3/uL (ref 0.1–1.0)
Monocytes Relative: 6 %
Neutro Abs: 3.6 10*3/uL (ref 1.7–7.7)
Neutrophils Relative %: 67 %
PLATELETS: 183 10*3/uL (ref 150–400)
RBC: 3.63 MIL/uL — AB (ref 3.87–5.11)
RDW: 14.7 % (ref 11.5–15.5)
WBC: 5.3 10*3/uL (ref 4.0–10.5)

## 2014-10-02 LAB — TSH: TSH: 1.876 u[IU]/mL (ref 0.350–4.500)

## 2014-10-02 MED ORDER — OXYCODONE HCL 5 MG PO TABS: 5.0000 mg | ORAL_TABLET | Freq: Four times a day (QID) | ORAL | Status: DC | PRN

## 2014-10-02 MED ORDER — HEPARIN SOD (PORK) LOCK FLUSH 100 UNIT/ML IV SOLN
500.0000 [IU] | Freq: Once | INTRAVENOUS | Status: AC | PRN
  Administered 2014-10-02: 500 [IU]
  Filled 2014-10-02: qty 5

## 2014-10-02 MED ORDER — SODIUM CHLORIDE 0.9 % IV SOLN
Freq: Once | INTRAVENOUS | Status: AC
  Administered 2014-10-02: 11:00:00 via INTRAVENOUS

## 2014-10-02 MED ORDER — SODIUM CHLORIDE 0.9 % IV SOLN
3.0000 mg/kg | Freq: Once | INTRAVENOUS | Status: AC
  Administered 2014-10-02: 240 mg via INTRAVENOUS
  Filled 2014-10-02: qty 20

## 2014-10-02 MED ORDER — SODIUM CHLORIDE 0.9 % IJ SOLN: 10.0000 mL | INTRAMUSCULAR | Status: DC | PRN

## 2014-10-02 NOTE — Progress Notes (Signed)
Patient tolerated infusion well.  VSS post infusion.

## 2014-10-02 NOTE — Patient Instructions (Signed)
..  [facility] Discharge Instructions for Patients Receiving Chemotherapy  Today you received the following chemotherapy agent: Opdivo.  To help prevent nausea and vomiting after your treatment, we encourage you to take your nausea medication Zofran '8mg'$  every 8 hours as needed for nausea or vomiting Begin taking it as needed and take it as often as prescribed for the next 48 hours.   If you develop nausea and vomiting, or diarrhea that is not controlled by your medication, call the clinic.  The clinic phone number is [phone]. Office hours are Monday-Friday 8:30am-5:00pm.  BELOW ARE SYMPTOMS THAT SHOULD BE REPORTED IMMEDIATELY:  *FEVER GREATER THAN 101.0 F  *CHILLS WITH OR WITHOUT FEVER  NAUSEA AND VOMITING THAT IS NOT CONTROLLED WITH YOUR NAUSEA MEDICATION  *UNUSUAL SHORTNESS OF BREATH  *UNUSUAL BRUISING OR BLEEDING  TENDERNESS IN MOUTH AND THROAT WITH OR WITHOUT PRESENCE OF ULCERS  *URINARY PROBLEMS  *BOWEL PROBLEMS  UNUSUAL RASH Items with * indicate a potential emergency and should be followed up as soon as possible. If you have an emergency after office hours please contact your primary care physician or go to the nearest emergency department.  Please call the clinic during office hours if you have any questions or concerns.   You may also contact the Patient Navigator at [phone] should you have any questions or need assistance in obtaining follow up care. _____________________________________________________________________ Have you asked about our STAR program?    STAR stands for Survivorship Training and Rehabilitation, and this is a nationally recognized cancer care program that focuses on survivorship and rehabilitation.  Cancer and cancer treatments may cause problems, such as, pain, making you feel tired and keeping you from doing the things that you need or want to do. Cancer rehabilitation can help. Our goal is to reduce these troubling  effects and help you have the best quality of life possible.  You may receive a survey from a nurse that asks questions about your current state of health.  Based on the survey results, all eligible patients will be referred to the [facility] program for an evaluation so we can better serve you! A frequently asked questions sheet is available upon request.

## 2014-10-05 ENCOUNTER — Ambulatory Visit (INDEPENDENT_AMBULATORY_CARE_PROVIDER_SITE_OTHER): Payer: PPO | Admitting: Psychiatry

## 2014-10-05 ENCOUNTER — Encounter (HOSPITAL_COMMUNITY): Payer: Self-pay | Admitting: Psychiatry

## 2014-10-05 DIAGNOSIS — F418 Other specified anxiety disorders: Secondary | ICD-10-CM

## 2014-10-05 NOTE — Patient Instructions (Signed)
Discussed orally 

## 2014-10-05 NOTE — Progress Notes (Signed)
          THERAPIST PROGRESS NOTE  Session Time:  Friday 10/05/2014 3:07 PM - 4:00 PM     Participation Level: Active  Behavioral Response: CasualAlert/ anxious, depressed,tearful  Type of Therapy: Individual Therapy  Treatment Goals addressed: Improve ability to manage stress and transitions with decreased irritability and feelings of being overwhelmed  Interventions: ACT, Supportive  Summary: [name] [name] is a 64 y.o. female who presents with symptoms of depression and anxiety that have been intermittent for several years that have been well controlled until patient was diagnosed with cancer in 2013. Symptoms have worsened in recent months as patient reports constantly thinking about her diagnoses and fears cancer will spread. She has been given a prognosis of 2-4 years to live. Patient reports taking chemotherapy pills and reports extreme fatigue. Patient also experiences depression, anxiety, and crying spells  Patient reports continued stress since last session as she recently had conflict with one of her daughters. She also continues to worry about her adult children's future.She expresses sadness about relationship with sister as she she says they aren't as close as they used to be. She and husband continued to argue excessively until the past 3-4 days. Patient states they have been really trying to avoid arguing. She says she has tried not to take things so personally. Patient continues to experience anxiety and constantly ruminates about the past or worries about the future. She reports waking up every morning wondering how much time she has left.   Suicidal/Homicidal: No    Therapist Response: Therapist works with patient to identify and challenge thoughts about her responsibility for her adult children, identify the effects of her thought patterns on her interaction and behavior with her husband, discussed the worth of spending time trying to predict  the future or ruminating  about the past, identify mindfulness activities and ways to incorporate in daily routine    Plan: Patient agrees to return for an appointment in 3-4 weeks. Patient agrees to practice a mindfulness activity daily    Diagnosis: Axis I: Depressive Disorder NOS    Axis II: Deferred    BYNUM,PEGGY, LCSW 10/05/2014

## 2014-10-08 ENCOUNTER — Telehealth (HOSPITAL_COMMUNITY): Payer: Self-pay

## 2014-10-08 ENCOUNTER — Other Ambulatory Visit: Payer: Self-pay | Admitting: Physician Assistant

## 2014-10-08 NOTE — Telephone Encounter (Signed)
Therapist returned patient's call. Patient reported she and husband had argument today and are considering splitting up. She is considering staying in the couple's RV while husband remains in their home. Therapist discussed with patient possibility of patient having time to calm down before making a decision about their marriage. Therapist also offered patient opportunity to come in for an earlier appointment. Patient declined. Therapist encouraged patient to call this practice if she decides to reconsider scheduling an earlier appointment. Patient admits she is depressed about the situation but denies any suicidal thoughts. She agrees to call  911 or have someone take her to the ER should symptoms worsen.

## 2014-10-08 NOTE — Telephone Encounter (Signed)
Medication refilled per protocol. 

## 2014-10-16 ENCOUNTER — Encounter (HOSPITAL_BASED_OUTPATIENT_CLINIC_OR_DEPARTMENT_OTHER): Payer: PPO

## 2014-10-16 ENCOUNTER — Encounter (HOSPITAL_COMMUNITY): Payer: PPO | Attending: Hematology and Oncology | Admitting: Oncology

## 2014-10-16 VITALS — BP 109/58 | HR 60 | Temp 98.1°F | Resp 16 | Wt 184.0 lb

## 2014-10-16 DIAGNOSIS — C649 Malignant neoplasm of unspecified kidney, except renal pelvis: Secondary | ICD-10-CM | POA: Insufficient documentation

## 2014-10-16 DIAGNOSIS — R4789 Other speech disturbances: Secondary | ICD-10-CM | POA: Diagnosis not present

## 2014-10-16 DIAGNOSIS — K219 Gastro-esophageal reflux disease without esophagitis: Secondary | ICD-10-CM | POA: Insufficient documentation

## 2014-10-16 DIAGNOSIS — Z23 Encounter for immunization: Secondary | ICD-10-CM | POA: Diagnosis not present

## 2014-10-16 DIAGNOSIS — C7800 Secondary malignant neoplasm of unspecified lung: Secondary | ICD-10-CM

## 2014-10-16 DIAGNOSIS — R5383 Other fatigue: Secondary | ICD-10-CM | POA: Diagnosis present

## 2014-10-16 DIAGNOSIS — R918 Other nonspecific abnormal finding of lung field: Secondary | ICD-10-CM | POA: Insufficient documentation

## 2014-10-16 DIAGNOSIS — Z5112 Encounter for antineoplastic immunotherapy: Secondary | ICD-10-CM | POA: Diagnosis not present

## 2014-10-16 DIAGNOSIS — R52 Pain, unspecified: Secondary | ICD-10-CM

## 2014-10-16 DIAGNOSIS — W19XXXA Unspecified fall, initial encounter: Secondary | ICD-10-CM

## 2014-10-16 LAB — COMPREHENSIVE METABOLIC PANEL
ALBUMIN: 3.6 g/dL (ref 3.5–5.0)
ALK PHOS: 66 U/L (ref 38–126)
ALT: 11 U/L — AB (ref 14–54)
AST: 18 U/L (ref 15–41)
Anion gap: 6 (ref 5–15)
BILIRUBIN TOTAL: 0.6 mg/dL (ref 0.3–1.2)
BUN: 24 mg/dL — ABNORMAL HIGH (ref 6–20)
CALCIUM: 8.9 mg/dL (ref 8.9–10.3)
CO2: 27 mmol/L (ref 22–32)
CREATININE: 1.48 mg/dL — AB (ref 0.44–1.00)
Chloride: 110 mmol/L (ref 101–111)
GFR calc non Af Amer: 36 mL/min — ABNORMAL LOW (ref >60)
GFR, EST AFRICAN AMERICAN: 42 mL/min — AB (ref >60)
GLUCOSE: 113 mg/dL — AB (ref 65–99)
Potassium: 3.7 mmol/L (ref 3.5–5.1)
SODIUM: 143 mmol/L (ref 135–145)
TOTAL PROTEIN: 6.9 g/dL (ref 6.5–8.1)

## 2014-10-16 LAB — CBC WITH DIFFERENTIAL/PLATELET
Basophils Absolute: 0 10*3/uL (ref 0.0–0.1)
Basophils Relative: 0 %
EOS ABS: 0.2 10*3/uL (ref 0.0–0.7)
Eosinophils Relative: 4 %
HEMATOCRIT: 34.7 % — AB (ref 36.0–46.0)
HEMOGLOBIN: 11.5 g/dL — AB (ref 12.0–15.0)
LYMPHS ABS: 1.5 10*3/uL (ref 0.7–4.0)
Lymphocytes Relative: 25 %
MCH: 32 pg (ref 26.0–34.0)
MCHC: 33.1 g/dL (ref 30.0–36.0)
MCV: 96.7 fL (ref 78.0–100.0)
MONOS PCT: 6 %
Monocytes Absolute: 0.4 10*3/uL (ref 0.1–1.0)
NEUTROS ABS: 4 10*3/uL (ref 1.7–7.7)
NEUTROS PCT: 65 %
Platelets: 162 10*3/uL (ref 150–400)
RBC: 3.59 MIL/uL — AB (ref 3.87–5.11)
RDW: 14.9 % (ref 11.5–15.5)
WBC: 6 10*3/uL (ref 4.0–10.5)

## 2014-10-16 LAB — TSH: TSH: 0.699 u[IU]/mL (ref 0.350–4.500)

## 2014-10-16 MED ORDER — HEPARIN SOD (PORK) LOCK FLUSH 100 UNIT/ML IV SOLN
500.0000 [IU] | Freq: Once | INTRAVENOUS | Status: AC | PRN
  Administered 2014-10-16: 500 [IU]
  Filled 2014-10-16: qty 5

## 2014-10-16 MED ORDER — NIVOLUMAB CHEMO INJECTION 100 MG/10ML
3.0000 mg/kg | Freq: Once | INTRAVENOUS | Status: AC
  Administered 2014-10-16: 240 mg via INTRAVENOUS
  Filled 2014-10-16: qty 20

## 2014-10-16 MED ORDER — SODIUM CHLORIDE 0.9 % IV SOLN
Freq: Once | INTRAVENOUS | Status: AC
  Administered 2014-10-16: 11:00:00 via INTRAVENOUS

## 2014-10-16 MED ORDER — INFLUENZA VAC SPLIT QUAD 0.5 ML IM SUSY
0.5000 mL | PREFILLED_SYRINGE | Freq: Once | INTRAMUSCULAR | Status: AC
  Administered 2014-10-16: 0.5 mL via INTRAMUSCULAR
  Filled 2014-10-16: qty 0.5

## 2014-10-16 MED ORDER — SODIUM CHLORIDE 0.9 % IJ SOLN: 10.0000 mL | INTRAMUSCULAR | Status: DC | PRN

## 2014-10-16 NOTE — Progress Notes (Signed)
Dr Whitney Muse notified of creatinine of 1.48, ok to give extra fluids today and pt informed to increase fluid intake at home  [place] chemotherapy well today Discharged ambulatory

## 2014-10-16 NOTE — Patient Instructions (Signed)
[place] at [facility] Discharge Instructions  RECOMMENDATIONS MADE BY THE CONSULTANT AND ANY TEST RESULTS WILL BE SENT TO YOUR REFERRING PHYSICIAN.  You will return as scheduled in two weeks and then in four weeks.  The MD will see you in 4 weeks when you get treated.  We are going to schedule you for a MRI, you may take a valium one hour before your scan and then repeat in the waiting room if you need it.    Continue the Lortisone cream as you have been for your skin rash.  You will be seen at [facility] in November as scheduled.    Thank you for choosing [place] at [facility] to provide your oncology and hematology care.  To afford each patient quality time with our provider, please arrive at least 15 minutes before your scheduled appointment time.    You need to re-schedule your appointment should you arrive 10 or more minutes late.  We strive to give you quality time with our providers, and arriving late affects you and other patients whose appointments are after yours.  Also, if you no show three or more times for appointments you may be dismissed from the clinic at the providers discretion.     Again, thank you for choosing [facility].  Our hope is that these requests will decrease the amount of time that you wait before being seen by our physicians.       _____________________________________________________________  Should you have questions after your visit to [facility], please contact our office at [phone] between the hours of 8:30 a.m. and 4:30 p.m.  Voicemails left after 4:30 p.m. will not be returned until the following business day.  For prescription refill requests, have your pharmacy contact our office.

## 2014-10-16 NOTE — Assessment & Plan Note (Addendum)
Stage IV RCC, on single agent Nivolumab with stability of diease.  Oncology history updated.  Will continue to follow TSH and manage Synthroid accordingly.  Pre-chemo labs as ordered.  Continued difficulty with her mortality, diagnosis, and prognosis.  New neurologic complaints with 2 recent falls, difficulty ambulating, and slowed speech, among other nonspecific neurologic complaints.  Will perform an MRI of brain w and wo contrast to evaluate for metastatic disease.  Return in 4 weeks for follow-up with continuation of treatment every 2 weeks.

## 2014-10-16 NOTE — Progress Notes (Signed)
[name] [name] presents today for injection per MD orders. Flu Vaccine administered IM in right Upper Arm. Administration without incident. Patient tolerated well.

## 2014-10-16 NOTE — Patient Instructions (Signed)
[facility] Discharge Instructions for Patients Receiving Chemotherapy  Today you received the following chemotherapy agents opdivo Return as scheduled Please call the clinic if you have any questions or concerns  To help prevent nausea and vomiting after your treatment, we encourage you to take your nausea medication    If you develop nausea and vomiting, or diarrhea that is not controlled by your medication, call the clinic.  The clinic phone number is [phone]. Office hours are Monday-Friday 8:30am-5:00pm.  BELOW ARE SYMPTOMS THAT SHOULD BE REPORTED IMMEDIATELY:  *FEVER GREATER THAN 101.0 F  *CHILLS WITH OR WITHOUT FEVER  NAUSEA AND VOMITING THAT IS NOT CONTROLLED WITH YOUR NAUSEA MEDICATION  *UNUSUAL SHORTNESS OF BREATH  *UNUSUAL BRUISING OR BLEEDING  TENDERNESS IN MOUTH AND THROAT WITH OR WITHOUT PRESENCE OF ULCERS  *URINARY PROBLEMS  *BOWEL PROBLEMS  UNUSUAL RASH Items with * indicate a potential emergency and should be followed up as soon as possible. If you have an emergency after office hours please contact your primary care physician or go to the nearest emergency department.  Please call the clinic during office hours if you have any questions or concerns.   You may also contact the Patient Navigator at [phone] should you have any questions or need assistance in obtaining follow up care. _____________________________________________________________________ Have you asked about our STAR program?    STAR stands for Survivorship Training and Rehabilitation, and this is a nationally recognized cancer care program that focuses on survivorship and rehabilitation.  Cancer and cancer treatments may cause problems, such as, pain, making you feel tired and keeping you from doing the things that you need or want to do. Cancer rehabilitation can help. Our goal is to reduce these troubling effects and help you have the best quality of life  possible.  You may receive a survey from a nurse that asks questions about your current state of health.  Based on the survey results, all eligible patients will be referred to the [facility] program for an evaluation so we can better serve you! A frequently asked questions sheet is available upon request.
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
## 2014-10-18 ENCOUNTER — Ambulatory Visit (INDEPENDENT_AMBULATORY_CARE_PROVIDER_SITE_OTHER): Payer: PPO | Admitting: Physician Assistant

## 2014-10-18 ENCOUNTER — Telehealth (HOSPITAL_COMMUNITY): Payer: Self-pay

## 2014-10-18 ENCOUNTER — Encounter: Payer: Self-pay | Admitting: Physician Assistant

## 2014-10-18 VITALS — BP 136/86 | HR 72 | Temp 98.1°F | Resp 20 | Wt 181.0 lb

## 2014-10-18 DIAGNOSIS — I10 Essential (primary) hypertension: Secondary | ICD-10-CM

## 2014-10-18 DIAGNOSIS — E038 Other specified hypothyroidism: Secondary | ICD-10-CM

## 2014-10-18 DIAGNOSIS — E785 Hyperlipidemia, unspecified: Secondary | ICD-10-CM
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
## 2014-10-18 NOTE — Telephone Encounter (Signed)
Requesting call back from [facility], PA-C.  Has questions about discussion held during office visit on 10/16/14.  She and her husband don't agree on what was said and she wants clarification.

## 2014-10-19 NOTE — Telephone Encounter (Signed)
Call received back from [place].  Had questions about treatment options - How long current therapy would continue to work, what are her options if it stops working, if not able to be treated anymore, what would be next.  Explained to [name] [name] that we would continue current therapy until she became more symptomatic or if scans/tests revealed disease progression.  Would address further treatment options or lack of options should her disease progress or she becomes more symptomatic. Options if no treatment available would be comfort measures and potential referral to hospice.  Very emotional and tearful over the phone.  Encouraged her to try to keep occupied with other things so she doesn't become so preoccupied with future issues. Do things that she enjoys doing.   To get things (finances, etc) taken care of so she doesn't have to worry about them down the road.

## 2014-10-19 NOTE — Telephone Encounter (Signed)
What clarification does she need?    [name] [name] 10/19/2014 7:48 AM

## 2014-10-22 ENCOUNTER — Telehealth (HOSPITAL_COMMUNITY): Payer: Self-pay | Admitting: *Deleted

## 2014-10-22 NOTE — Telephone Encounter (Signed)
Pt pharmacy requesting refills for pt Lexapro 20 mg QD 09-25-14. Looked in pt chart to see if this medication was d/c and when. Looked in chart and don't see any d/c date but mediation is no longer on pt medication list. Called pt to see if she happened to have a bottle or a list that stated she is still on this medication. Per pt, she do not see the Lexapro on her list and she looked through all of her bottles and do not see a bottle for Lexapro. Per pt, she don't know if one of her other doctors took her off of it but she don't see the Lexapro prescription. Called pt pharmacy 09-25-14 and informed them that per pt, she don't think she is on that medication. Pt then called back today 10-22-14 stating she just found the bottle for her Laxapro and wanted to know if she is suppose to be taking this medication and if so it does not have any refills. Looked in pt's chart and the Lexapro was on her medication list on 05-23-14 and on 06-20-14, it was no longer on her list. Per pt, her opinion is she thinks she just put it down and forgot it.  Pt number is [phone].

## 2014-10-22 NOTE — Telephone Encounter (Signed)
She is OFF lexapro/ she take Viibryd

## 2014-10-22 NOTE — Telephone Encounter (Signed)
Pt is aware and shows understanding 

## 2014-10-28 ENCOUNTER — Other Ambulatory Visit: Payer: Self-pay | Admitting: Physician Assistant

## 2014-10-29 NOTE — Telephone Encounter (Signed)
Medication refilled per protocol. 

## 2014-10-30 ENCOUNTER — Encounter (HOSPITAL_BASED_OUTPATIENT_CLINIC_OR_DEPARTMENT_OTHER): Payer: PPO | Admitting: Hematology & Oncology

## 2014-10-30 ENCOUNTER — Encounter (HOSPITAL_COMMUNITY): Payer: Self-pay | Admitting: Hematology & Oncology

## 2014-10-30 ENCOUNTER — Encounter (HOSPITAL_BASED_OUTPATIENT_CLINIC_OR_DEPARTMENT_OTHER): Payer: PPO

## 2014-10-30 VITALS — BP 121/53 | HR 64 | Temp 97.5°F | Resp 20

## 2014-10-30 VITALS — BP 137/67 | HR 61 | Temp 97.8°F | Resp 20 | Wt 183.4 lb

## 2014-10-30 DIAGNOSIS — Z5112 Encounter for antineoplastic immunotherapy: Secondary | ICD-10-CM

## 2014-10-30 DIAGNOSIS — F329 Major depressive disorder, single episode, unspecified: Secondary | ICD-10-CM | POA: Diagnosis not present

## 2014-10-30 DIAGNOSIS — F32A Depression, unspecified: Secondary | ICD-10-CM

## 2014-10-30 DIAGNOSIS — C799 Secondary malignant neoplasm of unspecified site: Secondary | ICD-10-CM | POA: Diagnosis not present

## 2014-10-30 DIAGNOSIS — E032 Hypothyroidism due to medicaments and other exogenous substances: Secondary | ICD-10-CM | POA: Diagnosis not present

## 2014-10-30 DIAGNOSIS — C649 Malignant neoplasm of unspecified kidney, except renal pelvis: Secondary | ICD-10-CM

## 2014-10-30 DIAGNOSIS — C7800 Secondary malignant neoplasm of unspecified lung: Secondary | ICD-10-CM

## 2014-10-30 LAB — CBC WITH DIFFERENTIAL/PLATELET
Basophils Absolute: 0 10*3/uL (ref 0.0–0.1)
Basophils Relative: 0 %
EOS ABS: 0.2 10*3/uL (ref 0.0–0.7)
Eosinophils Relative: 4 %
HCT: 35.6 % — ABNORMAL LOW (ref 36.0–46.0)
Hemoglobin: 11.8 g/dL — ABNORMAL LOW (ref 12.0–15.0)
LYMPHS ABS: 1.6 10*3/uL (ref 0.7–4.0)
Lymphocytes Relative: 24 %
MCH: 31.8 pg (ref 26.0–34.0)
MCHC: 33.1 g/dL (ref 30.0–36.0)
MCV: 96 fL (ref 78.0–100.0)
MONOS PCT: 4 %
Monocytes Absolute: 0.2 10*3/uL (ref 0.1–1.0)
Neutro Abs: 4.5 10*3/uL (ref 1.7–7.7)
Neutrophils Relative %: 68 %
PLATELETS: 170 10*3/uL (ref 150–400)
RBC: 3.71 MIL/uL — ABNORMAL LOW (ref 3.87–5.11)
RDW: 14.4 % (ref 11.5–15.5)
WBC: 6.6 10*3/uL (ref 4.0–10.5)

## 2014-10-30 LAB — COMPREHENSIVE METABOLIC PANEL
ALT: 11 U/L — ABNORMAL LOW (ref 14–54)
ANION GAP: 9 (ref 5–15)
AST: 19 U/L (ref 15–41)
Albumin: 3.7 g/dL (ref 3.5–5.0)
Alkaline Phosphatase: 64 U/L (ref 38–126)
BUN: 29 mg/dL — AB (ref 6–20)
CALCIUM: 9.6 mg/dL (ref 8.9–10.3)
CHLORIDE: 109 mmol/L (ref 101–111)
CO2: 24 mmol/L (ref 22–32)
CREATININE: 1.39 mg/dL — AB (ref 0.44–1.00)
GFR calc non Af Amer: 39 mL/min — ABNORMAL LOW (ref >60)
GFR, EST AFRICAN AMERICAN: 45 mL/min — AB (ref >60)
Glucose, Bld: 160 mg/dL — ABNORMAL HIGH (ref 65–99)
Potassium: 3.7 mmol/L (ref 3.5–5.1)
SODIUM: 142 mmol/L (ref 135–145)
Total Bilirubin: 0.5 mg/dL (ref 0.3–1.2)
Total Protein: 7 g/dL (ref 6.5–8.1)

## 2014-10-30 LAB — TSH: TSH: 1.388 u[IU]/mL (ref 0.350–4.500)

## 2014-10-30 MED ORDER — HEPARIN SOD (PORK) LOCK FLUSH 100 UNIT/ML IV SOLN
500.0000 [IU] | Freq: Once | INTRAVENOUS | Status: AC | PRN
  Administered 2014-10-30: 500 [IU]

## 2014-10-30 MED ORDER — SODIUM CHLORIDE 0.9 % IV SOLN
3.0000 mg/kg | Freq: Once | INTRAVENOUS | Status: AC
  Administered 2014-10-30: 240 mg via INTRAVENOUS
  Filled 2014-10-30: qty 20

## 2014-10-30 MED ORDER — HEPARIN SOD (PORK) LOCK FLUSH 100 UNIT/ML IV SOLN
INTRAVENOUS | Status: AC
  Filled 2014-10-30: qty 5

## 2014-10-30 MED ORDER — SODIUM CHLORIDE 0.9 % IV SOLN
Freq: Once | INTRAVENOUS | Status: AC
  Administered 2014-10-30: 09:00:00 via INTRAVENOUS

## 2014-10-30 MED ORDER — SODIUM CHLORIDE 0.9 % IJ SOLN: 10.0000 mL | INTRAMUSCULAR | Status: DC | PRN

## 2014-10-30 NOTE — Patient Instructions (Signed)
[place] at [facility] Discharge Instructions  RECOMMENDATIONS MADE BY THE CONSULTANT AND ANY TEST RESULTS WILL BE SENT TO YOUR REFERRING PHYSICIAN.  Exam done and seen by Dr. Whitney Muse Follow up for doctor visit in 1 month  Thank you for choosing [place] at [facility] to provide your oncology and hematology care.  To afford each patient quality time with our provider, please arrive at least 15 minutes before your scheduled appointment time.    You need to re-schedule your appointment should you arrive 10 or more minutes late.  We strive to give you quality time with our providers, and arriving late affects you and other patients whose appointments are after yours.  Also, if you no show three or more times for appointments you may be dismissed from the clinic at the providers discretion.     Again, thank you for choosing [facility].  Our hope is that these requests will decrease the amount of time that you wait before being seen by our physicians.       _____________________________________________________________  Should you have questions after your visit to [facility], please contact our office at [phone] between the hours of 8:30 a.m. and 4:30 p.m.  Voicemails left after 4:30 p.m. will not be returned until the following business day.  For prescription refill requests, have your pharmacy contact our office.
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
## 2014-10-30 NOTE — Patient Instructions (Signed)
[facility] Discharge Instructions for Patients Receiving Chemotherapy  Today you received the following chemotherapy agent: Opdivo.     If you develop nausea and vomiting, or diarrhea that is not controlled by your medication, call the clinic.  The clinic phone number is [phone]. Office hours are Monday-Friday 8:30am-5:00pm.  BELOW ARE SYMPTOMS THAT SHOULD BE REPORTED IMMEDIATELY:  *FEVER GREATER THAN 101.0 F  *CHILLS WITH OR WITHOUT FEVER  NAUSEA AND VOMITING THAT IS NOT CONTROLLED WITH YOUR NAUSEA MEDICATION  *UNUSUAL SHORTNESS OF BREATH  *UNUSUAL BRUISING OR BLEEDING  TENDERNESS IN MOUTH AND THROAT WITH OR WITHOUT PRESENCE OF ULCERS  *URINARY PROBLEMS  *BOWEL PROBLEMS  UNUSUAL RASH Items with * indicate a potential emergency and should be followed up as soon as possible. If you have an emergency after office hours please contact your primary care physician or go to the nearest emergency department.  Please call the clinic during office hours if you have any questions or concerns.   You may also contact the Patient Navigator at [phone] should you have any questions or need assistance in obtaining follow up care. _____________________________________________________________________ Have you asked about our STAR program?    STAR stands for Survivorship Training and Rehabilitation, and this is a nationally recognized cancer care program that focuses on survivorship and rehabilitation.  Cancer and cancer treatments may cause problems, such as, pain, making you feel tired and keeping you from doing the things that you need or want to do. Cancer rehabilitation can help. Our goal is to reduce these troubling effects and help you have the best quality of life possible.  You may receive a survey from a nurse that asks questions about your current state of health.  Based on the survey results, all eligible patients will be referred to the [facility] program for  an evaluation so we can better serve you! A frequently asked questions sheet is available upon request.

## 2014-11-02 ENCOUNTER — Encounter (HOSPITAL_COMMUNITY): Payer: Self-pay | Admitting: Psychiatry

## 2014-11-02 ENCOUNTER — Ambulatory Visit (HOSPITAL_COMMUNITY): Payer: Self-pay | Admitting: Psychiatry

## 2014-11-02 ENCOUNTER — Ambulatory Visit (INDEPENDENT_AMBULATORY_CARE_PROVIDER_SITE_OTHER): Payer: PPO | Admitting: Psychiatry

## 2014-11-02 ENCOUNTER — Ambulatory Visit (HOSPITAL_COMMUNITY)
Admission: RE | Admit: 2014-11-02 | Discharge: 2014-11-02 | Disposition: A | Discharge status: Home / Self Care | Payer: PPO | Source: Ambulatory Visit | Attending: Oncology | Admitting: Oncology

## 2014-11-02 DIAGNOSIS — C649 Malignant neoplasm of unspecified kidney, except renal pelvis: Secondary | ICD-10-CM

## 2014-11-02 DIAGNOSIS — M25511 Pain in right shoulder: Secondary | ICD-10-CM | POA: Diagnosis not present

## 2014-11-02 DIAGNOSIS — F418 Other specified anxiety disorders: Secondary | ICD-10-CM | POA: Diagnosis not present

## 2014-11-02 DIAGNOSIS — R413 Other amnesia: Secondary | ICD-10-CM | POA: Diagnosis not present

## 2014-11-02 MED ORDER — GADOBENATE DIMEGLUMINE 529 MG/ML IV SOLN
8.0000 mL | Freq: Once | INTRAVENOUS | Status: AC | PRN
  Administered 2014-11-02: 8 mL via INTRAVENOUS

## 2014-11-02 NOTE — Patient Instructions (Signed)
Discussed orally 

## 2014-11-02 NOTE — Progress Notes (Signed)
           THERAPIST PROGRESS NOTE  Session Time:    Friday  11/01/2013 3:00 PM - 3:56 PM   Participation Level: Active  Behavioral Response: CasualAlert/ anxious, depressed,tearful  Type of Therapy: Individual Therapy  Treatment Goals addressed: Improve ability to manage stress and transitions with decreased irritability and feelings of being overwhelmed  Interventions: ACT, Supportive  Summary: MILICA GULLY is a 64 y.o. female who presents with symptoms of depression and anxiety that have been intermittent for several years that have been well controlled until patient was diagnosed with cancer in 2013. Symptoms have worsened in recent months as patient reports constantly thinking about her diagnoses and fears cancer will spread. She has been given a prognosis of 2-4 years to live. Patient reports taking chemotherapy pills and reports extreme fatigue. Patient also experiences depression, anxiety, and crying spells  Patient reports continued stress since last session due to ongoing conflict with husband and other family members. She continues to experience crying spells, irritability, and excessive worry about multiple issues. She says she and husband argue almost daily. She has experienced increased worry about life after death and had doubts about her salvation. She also continues to fear children will not receive salvation. She complains children don't spend much time with her. She did enjoy a recent day trip with her sister. Patient also admits increased anger. She states being angry at having cancer and other people being able to go on with their lives while she can't do things like she use to.  Suicidal/Homicidal: No    Therapist Response: Therapist works with patient to verbalize her anger, process feelings, and explore possible resources to assist patient with spiritual issues  Plan: Patient agrees to return for an appointment in 3-4 weeks.     Diagnosis: Axis I: Depressive  Disorder NOS    Axis II: Deferred    Alexx Giambra, LCSW 11/02/2014

## 2014-11-09 ENCOUNTER — Other Ambulatory Visit (HOSPITAL_COMMUNITY): Payer: Self-pay | Admitting: Hematology & Oncology

## 2014-11-10 ENCOUNTER — Other Ambulatory Visit (HOSPITAL_COMMUNITY): Payer: Self-pay | Admitting: Oncology

## 2014-11-12 ENCOUNTER — Encounter (HOSPITAL_COMMUNITY): Payer: Self-pay | Admitting: Psychiatry

## 2014-11-12 ENCOUNTER — Ambulatory Visit (INDEPENDENT_AMBULATORY_CARE_PROVIDER_SITE_OTHER): Payer: PPO | Admitting: Psychiatry

## 2014-11-12 VITALS — BP 140/77 | HR 72 | Ht 66.0 in | Wt 186.4 lb

## 2014-11-12 DIAGNOSIS — F418 Other specified anxiety disorders: Secondary | ICD-10-CM | POA: Diagnosis not present

## 2014-11-12 MED ORDER — ZOLPIDEM TARTRATE 5 MG PO TABS: 5.0000 mg | ORAL_TABLET | Freq: Every evening | ORAL | Status: DC | PRN

## 2014-11-12 MED ORDER — DIAZEPAM 10 MG PO TABS
10.0000 mg | ORAL_TABLET | Freq: Four times a day (QID) | ORAL | Status: DC
Start: 2014-11-12 — End: 2014-11-12

## 2014-11-12 MED ORDER — VILAZODONE HCL 40 MG PO TABS: 40.0000 mg | ORAL_TABLET | Freq: Every day | ORAL | Status: DC

## 2014-11-12 MED ORDER — DIAZEPAM 10 MG PO TABS: 10.0000 mg | ORAL_TABLET | Freq: Four times a day (QID) | ORAL | Status: DC
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
## 2014-11-13 ENCOUNTER — Encounter (HOSPITAL_COMMUNITY): Payer: PPO | Attending: Hematology and Oncology

## 2014-11-13 ENCOUNTER — Encounter (HOSPITAL_COMMUNITY): Payer: Self-pay

## 2014-11-13 VITALS — BP 133/61 | HR 71 | Temp 98.1°F | Resp 18 | Wt 185.4 lb

## 2014-11-13 DIAGNOSIS — Z5112 Encounter for antineoplastic immunotherapy: Secondary | ICD-10-CM | POA: Diagnosis not present

## 2014-11-13 DIAGNOSIS — R918 Other nonspecific abnormal finding of lung field: Secondary | ICD-10-CM | POA: Insufficient documentation

## 2014-11-13 DIAGNOSIS — C649 Malignant neoplasm of unspecified kidney, except renal pelvis: Secondary | ICD-10-CM | POA: Insufficient documentation

## 2014-11-13 DIAGNOSIS — R5383 Other fatigue: Secondary | ICD-10-CM | POA: Diagnosis present

## 2014-11-13 DIAGNOSIS — R1110 Vomiting, unspecified: Secondary | ICD-10-CM

## 2014-11-13 DIAGNOSIS — K219 Gastro-esophageal reflux disease without esophagitis: Secondary | ICD-10-CM | POA: Insufficient documentation

## 2014-11-13 DIAGNOSIS — C7800 Secondary malignant neoplasm of unspecified lung: Secondary | ICD-10-CM | POA: Diagnosis not present

## 2014-11-13 LAB — CBC WITH DIFFERENTIAL/PLATELET
Basophils Absolute: 0 10*3/uL (ref 0.0–0.1)
Basophils Relative: 0 %
Eosinophils Absolute: 0.2 10*3/uL (ref 0.0–0.7)
Eosinophils Relative: 3 %
HCT: 34.5 % — ABNORMAL LOW (ref 36.0–46.0)
Hemoglobin: 11.6 g/dL — ABNORMAL LOW (ref 12.0–15.0)
Lymphocytes Relative: 20 %
Lymphs Abs: 1.4 10*3/uL (ref 0.7–4.0)
MCH: 32.4 pg (ref 26.0–34.0)
MCHC: 33.6 g/dL (ref 30.0–36.0)
MCV: 96.4 fL (ref 78.0–100.0)
Monocytes Absolute: 0.3 10*3/uL (ref 0.1–1.0)
Monocytes Relative: 5 %
Neutro Abs: 4.9 10*3/uL (ref 1.7–7.7)
Neutrophils Relative %: 72 %
Platelets: 175 10*3/uL (ref 150–400)
RBC: 3.58 MIL/uL — ABNORMAL LOW (ref 3.87–5.11)
RDW: 14.5 % (ref 11.5–15.5)
WBC: 6.9 10*3/uL (ref 4.0–10.5)

## 2014-11-13 LAB — COMPREHENSIVE METABOLIC PANEL
ALT: 11 U/L — AB (ref 14–54)
AST: 20 U/L (ref 15–41)
Albumin: 3.8 g/dL (ref 3.5–5.0)
Alkaline Phosphatase: 73 U/L (ref 38–126)
Anion gap: 8 (ref 5–15)
BUN: 27 mg/dL — AB (ref 6–20)
CHLORIDE: 110 mmol/L (ref 101–111)
CO2: 24 mmol/L (ref 22–32)
CREATININE: 1.52 mg/dL — AB (ref 0.44–1.00)
Calcium: 9.5 mg/dL (ref 8.9–10.3)
GFR calc Af Amer: 40 mL/min — ABNORMAL LOW (ref >60)
GFR calc non Af Amer: 35 mL/min — ABNORMAL LOW (ref >60)
GLUCOSE: 113 mg/dL — AB (ref 65–99)
POTASSIUM: 3.9 mmol/L (ref 3.5–5.1)
SODIUM: 142 mmol/L (ref 135–145)
Total Bilirubin: 0.7 mg/dL (ref 0.3–1.2)
Total Protein: 7.2 g/dL (ref 6.5–8.1)

## 2014-11-13 LAB — TSH: TSH: 1.605 u[IU]/mL (ref 0.350–4.500)

## 2014-11-13 MED ORDER — ONDANSETRON HCL 8 MG PO TABS: 8.0000 mg | ORAL_TABLET | Freq: Three times a day (TID) | ORAL | Status: DC | PRN

## 2014-11-13 MED ORDER — HEPARIN SOD (PORK) LOCK FLUSH 100 UNIT/ML IV SOLN
INTRAVENOUS | Status: AC
  Filled 2014-11-13: qty 5

## 2014-11-13 MED ORDER — HEPARIN SOD (PORK) LOCK FLUSH 100 UNIT/ML IV SOLN
500.0000 [IU] | Freq: Once | INTRAVENOUS | Status: AC | PRN
  Administered 2014-11-13: 500 [IU]

## 2014-11-13 MED ORDER — SODIUM CHLORIDE 0.9 % IJ SOLN: 10.0000 mL | INTRAMUSCULAR | Status: DC | PRN

## 2014-11-13 MED ORDER — SODIUM CHLORIDE 0.9 % IV SOLN
240.0000 mg | Freq: Once | INTRAVENOUS | Status: AC
  Administered 2014-11-13: 240 mg via INTRAVENOUS
  Filled 2014-11-13: qty 20

## 2014-11-13 MED ORDER — SODIUM CHLORIDE 0.9 % IV SOLN
Freq: Once | INTRAVENOUS | Status: AC
  Administered 2014-11-13: 12:00:00 via INTRAVENOUS

## 2014-11-13 NOTE — Patient Instructions (Signed)
[facility] Discharge Instructions for Patients Receiving Chemotherapy  Today you received the following chemotherapy agents opdivo Follow up as scheduled PLease call the clinic if you have any questions or concerns  To help prevent nausea and vomiting after your treatment, we encourage you to take your nausea medication    If you develop nausea and vomiting, or diarrhea that is not controlled by your medication, call the clinic.  The clinic phone number is [phone]. Office hours are Monday-Friday 8:30am-5:00pm.  BELOW ARE SYMPTOMS THAT SHOULD BE REPORTED IMMEDIATELY:  *FEVER GREATER THAN 101.0 F  *CHILLS WITH OR WITHOUT FEVER  NAUSEA AND VOMITING THAT IS NOT CONTROLLED WITH YOUR NAUSEA MEDICATION  *UNUSUAL SHORTNESS OF BREATH  *UNUSUAL BRUISING OR BLEEDING  TENDERNESS IN MOUTH AND THROAT WITH OR WITHOUT PRESENCE OF ULCERS  *URINARY PROBLEMS  *BOWEL PROBLEMS  UNUSUAL RASH Items with * indicate a potential emergency and should be followed up as soon as possible. If you have an emergency after office hours please contact your primary care physician or go to the nearest emergency department.  Please call the clinic during office hours if you have any questions or concerns.   You may also contact the Patient Navigator at [phone] should you have any questions or need assistance in obtaining follow up care. _____________________________________________________________________ Have you asked about our STAR program?    STAR stands for Survivorship Training and Rehabilitation, and this is a nationally recognized cancer care program that focuses on survivorship and rehabilitation.  Cancer and cancer treatments may cause problems, such as, pain, making you feel tired and keeping you from doing the things that you need or want to do. Cancer rehabilitation can help. Our goal is to reduce these troubling effects and help you have the best quality of life  possible.  You may receive a survey from a nurse that asks questions about your current state of health.  Based on the survey results, all eligible patients will be referred to the [facility] program for an evaluation so we can better serve you! A frequently asked questions sheet is available upon request.

## 2014-11-13 NOTE — Progress Notes (Signed)
[name] [name] Tolerated chemotherapy well Discharged via wheelchair

## 2014-11-15 ENCOUNTER — Telehealth (HOSPITAL_COMMUNITY): Payer: Self-pay | Admitting: *Deleted

## 2014-11-15 ENCOUNTER — Telehealth (HOSPITAL_COMMUNITY): Payer: Self-pay | Admitting: Psychiatry

## 2014-11-15 NOTE — Telephone Encounter (Signed)
Therapist returned patient's call. Patient reports thinking someone that could address her spiritual issues would be included in next session and states she had called to see if this was the case. Therapist informed patient she and therapist had agreed patient would consult her pastor or other resources in her area to discuss theology. She states she and her husband have talked to their pastor but she continues to experience confusion and constantly thinks about her children's spiritual future and fears they will go to hell. She also reports continued arguing with husband. Therapist works with patient to review calming techniques and coping statements. Therapist and patient agree to include husband in next session. Patient agrees to keep scheduled appointment on 11/23/2014.

## 2014-11-15 NOTE — Telephone Encounter (Signed)
patient called regarding theologian, she needs you to call her back.

## 2014-11-23 ENCOUNTER — Ambulatory Visit (HOSPITAL_COMMUNITY): Payer: Self-pay | Admitting: Psychiatry

## 2014-11-23 ENCOUNTER — Other Ambulatory Visit (HOSPITAL_COMMUNITY): Payer: Self-pay

## 2014-11-23 ENCOUNTER — Telehealth (HOSPITAL_COMMUNITY): Payer: Self-pay | Admitting: *Deleted

## 2014-11-23 DIAGNOSIS — E039 Hypothyroidism, unspecified: Secondary | ICD-10-CM

## 2014-11-23 MED ORDER — LEVOTHYROXINE SODIUM 50 MCG PO TABS: 50.0000 ug | ORAL_TABLET | Freq: Every day | ORAL | Status: DC
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
## 2014-11-25 NOTE — Assessment & Plan Note (Addendum)
Stage IV RCC, on single agent Nivolumab.  Recent imaging on 11/19/2014 at [facility] demonstrated slight increase in mediastinal adenopathy compared to previous exam; no other findings of progressive or new disease.  Oncology history updated.    Copy of the patient's CT report is provided to her today following my review of it with the patient and her husband.  No changes in treatment plan at this time.  The patient's husband, in my presence, reminds her to ask for a refill on her Oxycodone.  She has not needed a refill in 2 months.  I will refill.  Her husband reports, "to be honest, I have been helping her with it [regarding her Oxycodone Rx] for my knee pain."  He is strongly advised that his actions are unlawful and are not to continue.  If I discover continuation of this, I will cease from writing pain medications.  He is advised to report to his primary care provider.  Will continue to follow TSH and manage Synthroid accordingly.  Pre-chemo labs as ordered.  Treatment parameters are met today.  Continued difficulty with her mortality, diagnosis, and prognosis.  Return in 4 weeks for follow-up with continuation of treatment every 2 weeks.

## 2014-11-27 ENCOUNTER — Encounter (HOSPITAL_BASED_OUTPATIENT_CLINIC_OR_DEPARTMENT_OTHER): Payer: PPO

## 2014-11-27 ENCOUNTER — Encounter (HOSPITAL_BASED_OUTPATIENT_CLINIC_OR_DEPARTMENT_OTHER): Payer: PPO | Admitting: Oncology

## 2014-11-27 ENCOUNTER — Encounter (HOSPITAL_COMMUNITY): Payer: Self-pay | Admitting: Oncology

## 2014-11-27 ENCOUNTER — Other Ambulatory Visit (HOSPITAL_COMMUNITY): Payer: Self-pay | Admitting: Oncology

## 2014-11-27 VITALS — BP 108/55 | HR 61 | Temp 98.4°F | Resp 16

## 2014-11-27 VITALS — BP 148/60 | HR 64 | Temp 97.7°F | Resp 18 | Wt 187.6 lb

## 2014-11-27 DIAGNOSIS — C649 Malignant neoplasm of unspecified kidney, except renal pelvis: Secondary | ICD-10-CM

## 2014-11-27 DIAGNOSIS — R52 Pain, unspecified: Secondary | ICD-10-CM | POA: Diagnosis not present

## 2014-11-27 DIAGNOSIS — Z5112 Encounter for antineoplastic immunotherapy: Secondary | ICD-10-CM | POA: Diagnosis not present

## 2014-11-27 DIAGNOSIS — C7800 Secondary malignant neoplasm of unspecified lung: Secondary | ICD-10-CM | POA: Diagnosis not present

## 2014-11-27 DIAGNOSIS — E039 Hypothyroidism, unspecified: Secondary | ICD-10-CM

## 2014-11-27 DIAGNOSIS — F329 Major depressive disorder, single episode, unspecified: Secondary | ICD-10-CM | POA: Diagnosis not present

## 2014-11-27 LAB — CBC WITH DIFFERENTIAL/PLATELET
Basophils Absolute: 0 10*3/uL (ref 0.0–0.1)
Basophils Relative: 0 %
EOS PCT: 3 %
Eosinophils Absolute: 0.2 10*3/uL (ref 0.0–0.7)
HCT: 35.6 % — ABNORMAL LOW (ref 36.0–46.0)
Hemoglobin: 12 g/dL (ref 12.0–15.0)
LYMPHS ABS: 1.5 10*3/uL (ref 0.7–4.0)
LYMPHS PCT: 17 %
MCH: 32.9 pg (ref 26.0–34.0)
MCHC: 33.7 g/dL (ref 30.0–36.0)
MCV: 97.5 fL (ref 78.0–100.0)
MONO ABS: 0.4 10*3/uL (ref 0.1–1.0)
Monocytes Relative: 4 %
Neutro Abs: 6.6 10*3/uL (ref 1.7–7.7)
Neutrophils Relative %: 76 %
PLATELETS: 179 10*3/uL (ref 150–400)
RBC: 3.65 MIL/uL — AB (ref 3.87–5.11)
RDW: 14 % (ref 11.5–15.5)
WBC: 8.8 10*3/uL (ref 4.0–10.5)

## 2014-11-27 LAB — COMPREHENSIVE METABOLIC PANEL
ALT: 11 U/L — AB (ref 14–54)
AST: 18 U/L (ref 15–41)
Albumin: 3.9 g/dL (ref 3.5–5.0)
Alkaline Phosphatase: 69 U/L (ref 38–126)
Anion gap: 9 (ref 5–15)
BUN: 27 mg/dL — AB (ref 6–20)
CALCIUM: 9.6 mg/dL (ref 8.9–10.3)
CHLORIDE: 109 mmol/L (ref 101–111)
CO2: 24 mmol/L (ref 22–32)
CREATININE: 1.61 mg/dL — AB (ref 0.44–1.00)
GFR, EST AFRICAN AMERICAN: 38 mL/min — AB (ref >60)
GFR, EST NON AFRICAN AMERICAN: 33 mL/min — AB (ref >60)
GLUCOSE: 107 mg/dL — AB (ref 65–99)
Potassium: 4.1 mmol/L (ref 3.5–5.1)
Sodium: 142 mmol/L (ref 135–145)
Total Bilirubin: 0.5 mg/dL (ref 0.3–1.2)
Total Protein: 7.1 g/dL (ref 6.5–8.1)

## 2014-11-27 LAB — TSH: TSH: 3.836 u[IU]/mL (ref 0.350–4.500)

## 2014-11-27 MED ORDER — OXYCODONE HCL 5 MG PO TABS: 5.0000 mg | ORAL_TABLET | Freq: Four times a day (QID) | ORAL | Status: DC | PRN

## 2014-11-27 MED ORDER — HEPARIN SOD (PORK) LOCK FLUSH 100 UNIT/ML IV SOLN
500.0000 [IU] | Freq: Once | INTRAVENOUS | Status: AC | PRN
  Administered 2014-11-27: 500 [IU]
  Filled 2014-11-27 (×2): qty 5

## 2014-11-27 MED ORDER — SODIUM CHLORIDE 0.9 % IV SOLN
Freq: Once | INTRAVENOUS | Status: AC
  Administered 2014-11-27: 12:00:00 via INTRAVENOUS

## 2014-11-27 MED ORDER — SODIUM CHLORIDE 0.9 % IV SOLN
240.0000 mg | Freq: Once | INTRAVENOUS | Status: AC
  Administered 2014-11-27: 240 mg via INTRAVENOUS
  Filled 2014-11-27: qty 24

## 2014-11-27 MED ORDER — LEVOTHYROXINE SODIUM 50 MCG PO TABS: 50.0000 ug | ORAL_TABLET | Freq: Every day | ORAL | Status: DC

## 2014-11-27 MED ORDER — SODIUM CHLORIDE 0.9 % IJ SOLN
10.0000 mL | INTRAMUSCULAR | Status: DC | PRN
  Administered 2014-11-27: 10 mL
  Filled 2014-11-27: qty 10

## 2014-11-27 NOTE — Patient Instructions (Addendum)
[place] at [facility] Discharge Instructions  RECOMMENDATIONS MADE BY THE CONSULTANT AND ANY TEST RESULTS WILL BE SENT TO YOUR REFERRING PHYSICIAN.  Exam and discussion by Robynn Pane, PA-C Refill for oxycodone - take as directed. Synthroid - take 50 mcg daily (1 tablet) Report fevers, uncontrolled nausea, vomiting, etc. Call with concerns or issues.  Follow-up in 2 weeks with chemo and office visit in 1 month.  Thank you for choosing [place] at [facility] to provide your oncology and hematology care.  To afford each patient quality time with our provider, please arrive at least 15 minutes before your scheduled appointment time.    You need to re-schedule your appointment should you arrive 10 or more minutes late.  We strive to give you quality time with our providers, and arriving late affects you and other patients whose appointments are after yours.  Also, if you no show three or more times for appointments you may be dismissed from the clinic at the providers discretion.     Again, thank you for choosing [facility].  Our hope is that these requests will decrease the amount of time that you wait before being seen by our physicians.       _____________________________________________________________  Should you have questions after your visit to [facility], please contact our office at [phone] between the hours of 8:30 a.m. and 4:30 p.m.  Voicemails left after 4:30 p.m. will not be returned until the following business day.  For prescription refill requests, have your pharmacy contact our office.

## 2014-11-27 NOTE — Progress Notes (Signed)
Lab results given to T. Kefalas, PA-C; he is aware of creatinine of 1.65.  Okay to treat today per PA.   1400:  Tolerated infusion w/o adverse reaction. VSS.  A&Ox4, in no distress.  Discharged via wheelchair in c/o husband for transport home.

## 2014-11-27 NOTE — Patient Instructions (Signed)
[facility] Discharge Instructions for Patients Receiving Chemotherapy  Today you received the following chemotherapy agents:  nivolumab  If you develop nausea and vomiting, or diarrhea that is not controlled by your medication, call the clinic.  The clinic phone number is [phone]. Office hours are Monday-Friday 8:30am-5:00pm.  BELOW ARE SYMPTOMS THAT SHOULD BE REPORTED IMMEDIATELY:  *FEVER GREATER THAN 101.0 F  *CHILLS WITH OR WITHOUT FEVER  NAUSEA AND VOMITING THAT IS NOT CONTROLLED WITH YOUR NAUSEA MEDICATION  *UNUSUAL SHORTNESS OF BREATH  *UNUSUAL BRUISING OR BLEEDING  TENDERNESS IN MOUTH AND THROAT WITH OR WITHOUT PRESENCE OF ULCERS  *URINARY PROBLEMS  *BOWEL PROBLEMS  UNUSUAL RASH Items with * indicate a potential emergency and should be followed up as soon as possible. If you have an emergency after office hours please contact your primary care physician or go to the nearest emergency department.  Please call the clinic during office hours if you have any questions or concerns.   You may also contact the Patient Navigator at [phone] should you have any questions or need assistance in obtaining follow up care.

## 2014-11-28 ENCOUNTER — Ambulatory Visit (HOSPITAL_COMMUNITY): Payer: Self-pay | Admitting: Psychiatry

## 2014-12-11 ENCOUNTER — Encounter: Payer: Self-pay | Admitting: *Deleted

## 2014-12-11 ENCOUNTER — Encounter (HOSPITAL_BASED_OUTPATIENT_CLINIC_OR_DEPARTMENT_OTHER): Payer: PPO

## 2014-12-11 VITALS — BP 127/63 | HR 69 | Temp 97.7°F | Resp 16 | Wt 184.0 lb

## 2014-12-11 DIAGNOSIS — C649 Malignant neoplasm of unspecified kidney, except renal pelvis: Secondary | ICD-10-CM | POA: Diagnosis not present

## 2014-12-11 DIAGNOSIS — Z5112 Encounter for antineoplastic immunotherapy: Secondary | ICD-10-CM

## 2014-12-11 LAB — CBC WITH DIFFERENTIAL/PLATELET
BASOS PCT: 0 %
Basophils Absolute: 0 10*3/uL (ref 0.0–0.1)
Eosinophils Absolute: 0.2 10*3/uL (ref 0.0–0.7)
Eosinophils Relative: 2 %
HEMATOCRIT: 38.6 % (ref 36.0–46.0)
HEMOGLOBIN: 13.1 g/dL (ref 12.0–15.0)
Lymphocytes Relative: 20 %
Lymphs Abs: 1.4 10*3/uL (ref 0.7–4.0)
MCH: 32.8 pg (ref 26.0–34.0)
MCHC: 33.9 g/dL (ref 30.0–36.0)
MCV: 96.5 fL (ref 78.0–100.0)
MONOS PCT: 4 %
Monocytes Absolute: 0.3 10*3/uL (ref 0.1–1.0)
NEUTROS ABS: 5.2 10*3/uL (ref 1.7–7.7)
NEUTROS PCT: 74 %
Platelets: 212 10*3/uL (ref 150–400)
RBC: 4 MIL/uL (ref 3.87–5.11)
RDW: 14.2 % (ref 11.5–15.5)
WBC: 7 10*3/uL (ref 4.0–10.5)

## 2014-12-11 LAB — COMPREHENSIVE METABOLIC PANEL
ALBUMIN: 4.2 g/dL (ref 3.5–5.0)
ALK PHOS: 85 U/L (ref 38–126)
ALT: 13 U/L — AB (ref 14–54)
ANION GAP: 10 (ref 5–15)
AST: 19 U/L (ref 15–41)
BILIRUBIN TOTAL: 0.9 mg/dL (ref 0.3–1.2)
BUN: 26 mg/dL — AB (ref 6–20)
CALCIUM: 10 mg/dL (ref 8.9–10.3)
CO2: 22 mmol/L (ref 22–32)
CREATININE: 1.47 mg/dL — AB (ref 0.44–1.00)
Chloride: 108 mmol/L (ref 101–111)
GFR calc Af Amer: 42 mL/min — ABNORMAL LOW (ref >60)
GFR calc non Af Amer: 36 mL/min — ABNORMAL LOW (ref >60)
GLUCOSE: 132 mg/dL — AB (ref 65–99)
Potassium: 3.9 mmol/L (ref 3.5–5.1)
Sodium: 140 mmol/L (ref 135–145)
TOTAL PROTEIN: 8 g/dL (ref 6.5–8.1)

## 2014-12-11 LAB — TSH: TSH: 1.794 u[IU]/mL (ref 0.350–4.500)

## 2014-12-11 MED ORDER — SODIUM CHLORIDE 0.9 % IV SOLN
Freq: Once | INTRAVENOUS | Status: AC
  Administered 2014-12-11: 12:00:00 via INTRAVENOUS

## 2014-12-11 MED ORDER — SODIUM CHLORIDE 0.9 % IV SOLN
240.0000 mg | Freq: Once | INTRAVENOUS | Status: AC
  Administered 2014-12-11: 240 mg via INTRAVENOUS
  Filled 2014-12-11: qty 20

## 2014-12-11 MED ORDER — HEPARIN SOD (PORK) LOCK FLUSH 100 UNIT/ML IV SOLN
500.0000 [IU] | Freq: Once | INTRAVENOUS | Status: AC | PRN
  Administered 2014-12-11: 500 [IU]
  Filled 2014-12-11: qty 5

## 2014-12-11 MED ORDER — SODIUM CHLORIDE 0.9 % IJ SOLN
10.0000 mL | INTRAMUSCULAR | Status: DC | PRN
  Administered 2014-12-11: 10 mL
  Filled 2014-12-11: qty 10

## 2014-12-11 NOTE — Progress Notes (Signed)
[place] Clinical Social Work  Clinical Social Work was referred by [place] rounding in the infusion room for assessment of psychosocial needs due to cancer diagnosis. Clinical Social Worker met with patient and her husband to offer support and assess for needs.  CSW had not met pt in the past, nor had reviewed her chart prior to meeting with pt. Pt had questions about ADRs/HCPOA paperwork and CSW answered questions, provided packet and reviewed process with pt and her husband. Pt became very tearful and also angry when discussing her cancer prognosis. Pt shared her resources and her counselor that she sees regularly. Pt shared concerns of ongoing anxiety and depression. She feels nothing helps. Pt shared her family has "deserted" her and she can't sleep due to "crazy dreams".   CSW attempted to talk pt through behavioral coping techniques to assist. Pt stated "nothing works". Pt shared her illness had "stolen her life". CSW discussed with pt common coping techniques when dealing with cancer diagnosis. CSW also reviewed many resources and options for assistance with coping; [employer] [employer] programs, Duanne [place], support groups and CSW support. Pt not very interested in any of these options. Husband shared family is not as involved due to pt's emotions. CSW provided supportive presence and made self available for additional counseling and support. They agree to CSW to assist with ADR completion and check in at future appointments.     Clinical Social Work interventions: Supportive listening Resource/coping education  [name] [name], [place] Tuesdays   [phone]

## 2014-12-11 NOTE — Progress Notes (Signed)
Tolerated Opidvo infusion well. Ambulatory on discharge with husband.

## 2014-12-11 NOTE — Patient Instructions (Signed)
[facility] Discharge Instructions for Patients Receiving Chemotherapy  Today you received the following chemotherapy agents Opdivo. To help prevent nausea and vomiting after your treatment, we encourage you to take your nausea medication as instructed. If you develop nausea and vomiting that is not controlled by your nausea medication, call the clinic. If it is after clinic hours your family physician or the after hours number for the clinic or go to the Emergency Department.  BELOW ARE SYMPTOMS THAT SHOULD BE REPORTED IMMEDIATELY:  *FEVER GREATER THAN 101.0 F  *CHILLS WITH OR WITHOUT FEVER  NAUSEA AND VOMITING THAT IS NOT CONTROLLED WITH YOUR NAUSEA MEDICATION  *UNUSUAL SHORTNESS OF BREATH  *UNUSUAL BRUISING OR BLEEDING  TENDERNESS IN MOUTH AND THROAT WITH OR WITHOUT PRESENCE OF ULCERS  *URINARY PROBLEMS  *BOWEL PROBLEMS  UNUSUAL RASH Items with * indicate a potential emergency and should be followed up as soon as possible.  Return as scheduled.  I have been informed and understand all the instructions given to me. I know to contact the clinic, my physician, or go to the Emergency Department if any problems should occur. I do not have any questions at this time, but understand that I may call the clinic during office hours or the Patient Navigator at [phone] should I have any questions or need assistance in obtaining follow up care.    __________________________________________  _____________  __________ Signature of Patient or Authorized Representative            Date                   Time    __________________________________________ Nurse's Signature  

## 2014-12-13 NOTE — Telephone Encounter (Signed)
Call from patient requesting refill for Oxycodone 5 mg.  Only has 4 - 5 left.

## 2014-12-13 NOTE — Telephone Encounter (Signed)
Please call patient and find out where her pain is and what has changed.  Typically, 60 tablets has lasted her 2 months or more.  What's changed?  TK  For nursing information only: On our last appointment, I found out that her husband has been using the patient's pain medication as documented in my last note.  I have to be a little suspicious of her recent pain medication change in light of her stable scan recently at [facility].  TK

## 2014-12-13 NOTE — Telephone Encounter (Signed)
Rx filled on 10/02/14 and 11/28/14 per [place].  Spoke with Kati.  Had misplaced her bottle from 11/16 and was looking at her old bottle.  Found new bottle and it is full.  Does not need a new prescription.

## 2014-12-19 ENCOUNTER — Encounter (HOSPITAL_COMMUNITY): Payer: Self-pay | Admitting: Psychiatry

## 2014-12-19 ENCOUNTER — Ambulatory Visit (INDEPENDENT_AMBULATORY_CARE_PROVIDER_SITE_OTHER): Payer: PPO | Admitting: Psychiatry

## 2014-12-19 DIAGNOSIS — F418 Other specified anxiety disorders: Secondary | ICD-10-CM

## 2014-12-19 NOTE — Patient Instructions (Signed)
Discussed orally 

## 2014-12-19 NOTE — Progress Notes (Signed)
           THERAPIST PROGRESS NOTE  Session Time:    Wednesday 12/19/2014  9:05 AM -  10:05 AM   Participation Level: Active  Behavioral Response: CasualAlert/ anxious, depressed,tearful  Type of Therapy: Family Therapy  reatment Goals addressed: Improve ability to manage stress and transitions with decreased irritability and feelings of being overwhelmed  Interventions: ACT, Supportive  Summary: [name] [name] is a 64 y.o. female who presents with symptoms of depression and anxiety that have been intermittent for several years that have been well controlled until patient was diagnosed with cancer in 2013. Symptoms have worsened in recent months as patient reports constantly thinking about her diagnoses and fears cancer will spread. She has been given a prognosis of 2-4 years to live. Patient reports taking chemotherapy pills and reports extreme fatigue. Patient also experiences depression, anxiety, and crying spells  Patient and husband attend session today. They both report continued stress since last session due to ongoing conflict in their marriage and say they argue daily. Patient complains husband treats her like a child and will not comfort her the way she wants. Husband reports patient is negative and controlling. Patient continues to experience crying spells, irritability, and excessive worry about family's salvation.    Suicidal/Homicidal: No    Therapist Response: Therapist works with patient and husband to identify ways to improve communication skills, identify ways to avoid escalating conflict, discuss referral to Dr. Sima Matas for couple's therapy  Plan: Patient agrees to return for an appointment in 3-4 weeks. Couple agrees to schedule appointment with Dr. Sima Matas for couple's therapy    Diagnosis: Axis I: Depressive Disorder NOS    Axis II: Deferred    BYNUM,PEGGY, LCSW 12/19/2014
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
## 2014-12-25 ENCOUNTER — Encounter (HOSPITAL_BASED_OUTPATIENT_CLINIC_OR_DEPARTMENT_OTHER): Payer: PPO

## 2014-12-25 ENCOUNTER — Encounter (HOSPITAL_COMMUNITY): Payer: PPO | Attending: Hematology and Oncology | Admitting: Hematology & Oncology

## 2014-12-25 ENCOUNTER — Encounter (HOSPITAL_COMMUNITY): Payer: Self-pay | Admitting: Hematology & Oncology

## 2014-12-25 VITALS — BP 116/63 | HR 73 | Temp 97.7°F | Resp 20

## 2014-12-25 VITALS — BP 118/64 | HR 68 | Temp 97.7°F | Resp 18 | Wt 185.2 lb

## 2014-12-25 DIAGNOSIS — R05 Cough: Secondary | ICD-10-CM

## 2014-12-25 DIAGNOSIS — Z5112 Encounter for antineoplastic immunotherapy: Secondary | ICD-10-CM

## 2014-12-25 DIAGNOSIS — F329 Major depressive disorder, single episode, unspecified: Secondary | ICD-10-CM

## 2014-12-25 DIAGNOSIS — C649 Malignant neoplasm of unspecified kidney, except renal pelvis: Secondary | ICD-10-CM

## 2014-12-25 DIAGNOSIS — K219 Gastro-esophageal reflux disease without esophagitis: Secondary | ICD-10-CM | POA: Diagnosis not present

## 2014-12-25 DIAGNOSIS — R5383 Other fatigue: Secondary | ICD-10-CM | POA: Insufficient documentation

## 2014-12-25 DIAGNOSIS — R918 Other nonspecific abnormal finding of lung field: Secondary | ICD-10-CM | POA: Diagnosis present

## 2014-12-25 DIAGNOSIS — F32A Depression, unspecified: Secondary | ICD-10-CM

## 2014-12-25 LAB — CBC WITH DIFFERENTIAL/PLATELET
BASOS ABS: 0 10*3/uL (ref 0.0–0.1)
BASOS PCT: 0 %
EOS ABS: 0.3 10*3/uL (ref 0.0–0.7)
Eosinophils Relative: 5 %
HCT: 37 % (ref 36.0–46.0)
HEMOGLOBIN: 12.4 g/dL (ref 12.0–15.0)
LYMPHS ABS: 1.5 10*3/uL (ref 0.7–4.0)
Lymphocytes Relative: 22 %
MCH: 32.5 pg (ref 26.0–34.0)
MCHC: 33.5 g/dL (ref 30.0–36.0)
MCV: 97.1 fL (ref 78.0–100.0)
Monocytes Absolute: 0.4 10*3/uL (ref 0.1–1.0)
Monocytes Relative: 6 %
NEUTROS PCT: 67 %
Neutro Abs: 4.5 10*3/uL (ref 1.7–7.7)
PLATELETS: 182 10*3/uL (ref 150–400)
RBC: 3.81 MIL/uL — AB (ref 3.87–5.11)
RDW: 14.1 % (ref 11.5–15.5)
WBC: 6.7 10*3/uL (ref 4.0–10.5)

## 2014-12-25 LAB — COMPREHENSIVE METABOLIC PANEL
ALK PHOS: 79 U/L (ref 38–126)
ALT: 12 U/L — AB (ref 14–54)
AST: 21 U/L (ref 15–41)
Albumin: 4.1 g/dL (ref 3.5–5.0)
Anion gap: 9 (ref 5–15)
BILIRUBIN TOTAL: 0.5 mg/dL (ref 0.3–1.2)
BUN: 31 mg/dL — AB (ref 6–20)
CALCIUM: 9.7 mg/dL (ref 8.9–10.3)
CHLORIDE: 110 mmol/L (ref 101–111)
CO2: 22 mmol/L (ref 22–32)
CREATININE: 1.44 mg/dL — AB (ref 0.44–1.00)
GFR calc Af Amer: 43 mL/min — ABNORMAL LOW (ref >60)
GFR, EST NON AFRICAN AMERICAN: 37 mL/min — AB (ref >60)
Glucose, Bld: 143 mg/dL — ABNORMAL HIGH (ref 65–99)
Potassium: 4.1 mmol/L (ref 3.5–5.1)
Sodium: 141 mmol/L (ref 135–145)
Total Protein: 7.8 g/dL (ref 6.5–8.1)

## 2014-12-25 LAB — TSH: TSH: 1.392 u[IU]/mL (ref 0.350–4.500)

## 2014-12-25 MED ORDER — SODIUM CHLORIDE 0.9 % IV SOLN
240.0000 mg | Freq: Once | INTRAVENOUS | Status: AC
  Administered 2014-12-25: 240 mg via INTRAVENOUS
  Filled 2014-12-25: qty 20

## 2014-12-25 MED ORDER — SODIUM CHLORIDE 0.9 % IJ SOLN
10.0000 mL | INTRAMUSCULAR | Status: DC | PRN
  Administered 2014-12-25: 10 mL
  Filled 2014-12-25: qty 10

## 2014-12-25 MED ORDER — OMEPRAZOLE 40 MG PO CPDR: 40.0000 mg | DELAYED_RELEASE_CAPSULE | Freq: Every day | ORAL | Status: DC

## 2014-12-25 MED ORDER — HEPARIN SOD (PORK) LOCK FLUSH 100 UNIT/ML IV SOLN
INTRAVENOUS | Status: AC
  Filled 2014-12-25: qty 5

## 2014-12-25 MED ORDER — HEPARIN SOD (PORK) LOCK FLUSH 100 UNIT/ML IV SOLN
500.0000 [IU] | Freq: Once | INTRAVENOUS | Status: AC | PRN
  Administered 2014-12-25: 500 [IU]

## 2014-12-25 MED ORDER — SODIUM CHLORIDE 0.9 % IV SOLN
Freq: Once | INTRAVENOUS | Status: AC
  Administered 2014-12-25: 12:00:00 via INTRAVENOUS

## 2014-12-25 NOTE — Patient Instructions (Addendum)
[place] at [facility] Discharge Instructions  RECOMMENDATIONS MADE BY THE CONSULTANT AND ANY TEST RESULTS WILL BE SENT TO YOUR REFERRING PHYSICIAN.   Exam completed by Dr Whitney Muse today Chemotherapy as planned today Return for opdivo in 2 weeks  Let us know if the cough is not better next week we can call you something in  Try taking zofran at night to help with your nausea prilosec called in to your pharmacy, try this to see if it will help with your stomach, take one daily Return to see the doctor in 1 month  Please call the clinic if you have any questions or concerns   Thank you for choosing [place] at [facility] to provide your oncology and hematology care.  To afford each patient quality time with our provider, please arrive at least 15 minutes before your scheduled appointment time.    You need to re-schedule your appointment should you arrive 10 or more minutes late.  We strive to give you quality time with our providers, and arriving late affects you and other patients whose appointments are after yours.  Also, if you no show three or more times for appointments you may be dismissed from the clinic at the providers discretion.     Again, thank you for choosing [facility].  Our hope is that these requests will decrease the amount of time that you wait before being seen by our physicians.       _____________________________________________________________  Should you have questions after your visit to [facility], please contact our office at [phone] between the hours of 8:30 a.m. and 4:30 p.m.  Voicemails left after 4:30 p.m. will not be returned until the following business day.  For prescription refill requests, have your pharmacy contact our office.

## 2014-12-25 NOTE — Progress Notes (Signed)
Tolerated infusion w/o adverse reaction.  VSS.  In no distress.  Discharged via wheelchair in c/o spouse.

## 2014-12-25 NOTE — Patient Instructions (Signed)
[facility] Discharge Instructions for Patients Receiving Chemotherapy  Today you received the following chemotherapy agents:  Opdivo  If you develop nausea and vomiting, or diarrhea that is not controlled by your medication, call the clinic.  The clinic phone number is [phone]. Office hours are Monday-Friday 8:30am-5:00pm.  BELOW ARE SYMPTOMS THAT SHOULD BE REPORTED IMMEDIATELY:  *FEVER GREATER THAN 101.0 F  *CHILLS WITH OR WITHOUT FEVER  NAUSEA AND VOMITING THAT IS NOT CONTROLLED WITH YOUR NAUSEA MEDICATION  *UNUSUAL SHORTNESS OF BREATH  *UNUSUAL BRUISING OR BLEEDING  TENDERNESS IN MOUTH AND THROAT WITH OR WITHOUT PRESENCE OF ULCERS  *URINARY PROBLEMS  *BOWEL PROBLEMS  UNUSUAL RASH Items with * indicate a potential emergency and should be followed up as soon as possible. If you have an emergency after office hours please contact your primary care physician or go to the nearest emergency department.  Please call the clinic during office hours if you have any questions or concerns.   You may also contact the Patient Navigator at [phone] should you have any questions or need assistance in obtaining follow up care.

## 2014-12-27 ENCOUNTER — Telehealth (HOSPITAL_COMMUNITY): Payer: Self-pay | Admitting: *Deleted

## 2014-12-27 ENCOUNTER — Telehealth (HOSPITAL_COMMUNITY): Payer: Self-pay | Admitting: Psychiatry

## 2014-12-27 NOTE — Telephone Encounter (Signed)
Therapist returned patient's call and clarified information regarding referral to Dr. Sima Matas for  family/couples's therapy. Patient agreed to call and schedule an appointment.

## 2014-12-27 NOTE — Telephone Encounter (Signed)
Pt called stating that she would like [name] [name] to call her back. Per pt, she was wanting to talk to her about the marriage counseling that Vickii Chafe had mentioned her last visit. Pt number is [phone]. Per pt, she states she have just a couple questions.

## 2014-12-31 ENCOUNTER — Telehealth (HOSPITAL_COMMUNITY): Payer: Self-pay | Admitting: *Deleted

## 2014-12-31 NOTE — Telephone Encounter (Signed)
Pt called stating that her insurance will only pay for 90 tablets of her Ambien for the year. Pt have Medicare part D. Called pt pharmacy and spoke with Kenney Houseman and she stated pt is correct due to her age. Per Kenney Houseman, pt payed out of her pocket on 11-13-14, 10-08-14 and 08-27-14. Can you please see if this pt need Prior Auth for her Ambien.

## 2014-12-31 NOTE — Telephone Encounter (Signed)
Phone call from patient regarding Ambien '5mg'$ .  Insurance will only pay for 90 a year.   She need prior authorization. Tenmazepam is another alternative.

## 2015-01-02 ENCOUNTER — Telehealth (HOSPITAL_COMMUNITY): Payer: Self-pay | Admitting: *Deleted

## 2015-01-02 NOTE — Telephone Encounter (Signed)
Called for prior authorization of Ambien at [phone] spoke with Clarise Cruz who states it needs to be done online with cover my meds. Completed online and awaiting decision to be faxed.

## 2015-01-08 ENCOUNTER — Inpatient Hospital Stay (HOSPITAL_COMMUNITY): Payer: Self-pay

## 2015-01-08 ENCOUNTER — Ambulatory Visit (HOSPITAL_COMMUNITY): Payer: Self-pay | Admitting: Psychiatry

## 2015-01-08 NOTE — Telephone Encounter (Signed)
noted 

## 2015-01-10 ENCOUNTER — Encounter (HOSPITAL_BASED_OUTPATIENT_CLINIC_OR_DEPARTMENT_OTHER): Payer: PPO

## 2015-01-10 ENCOUNTER — Encounter (HOSPITAL_COMMUNITY): Payer: Self-pay | Admitting: Psychiatry

## 2015-01-10 ENCOUNTER — Encounter (HOSPITAL_COMMUNITY): Payer: Self-pay

## 2015-01-10 ENCOUNTER — Ambulatory Visit (INDEPENDENT_AMBULATORY_CARE_PROVIDER_SITE_OTHER): Payer: PPO | Admitting: Psychiatry

## 2015-01-10 VITALS — BP 122/59 | HR 65 | Temp 97.8°F | Resp 16 | Wt 184.3 lb

## 2015-01-10 DIAGNOSIS — C649 Malignant neoplasm of unspecified kidney, except renal pelvis: Secondary | ICD-10-CM

## 2015-01-10 DIAGNOSIS — Z5112 Encounter for antineoplastic immunotherapy: Secondary | ICD-10-CM | POA: Diagnosis not present

## 2015-01-10 DIAGNOSIS — F418 Other specified anxiety disorders: Secondary | ICD-10-CM

## 2015-01-10 LAB — CBC WITH DIFFERENTIAL/PLATELET
Basophils Absolute: 0 10*3/uL (ref 0.0–0.1)
Basophils Relative: 0 %
EOS ABS: 0.4 10*3/uL (ref 0.0–0.7)
Eosinophils Relative: 5 %
HCT: 36.4 % (ref 36.0–46.0)
HEMOGLOBIN: 12.6 g/dL (ref 12.0–15.0)
LYMPHS ABS: 1.6 10*3/uL (ref 0.7–4.0)
Lymphocytes Relative: 19 %
MCH: 32.9 pg (ref 26.0–34.0)
MCHC: 34.6 g/dL (ref 30.0–36.0)
MCV: 95 fL (ref 78.0–100.0)
Monocytes Absolute: 0.5 10*3/uL (ref 0.1–1.0)
Monocytes Relative: 7 %
NEUTROS PCT: 69 %
Neutro Abs: 5.6 10*3/uL (ref 1.7–7.7)
Platelets: 225 10*3/uL (ref 150–400)
RBC: 3.83 MIL/uL — AB (ref 3.87–5.11)
RDW: 14.2 % (ref 11.5–15.5)
WBC: 8.1 10*3/uL (ref 4.0–10.5)

## 2015-01-10 LAB — COMPREHENSIVE METABOLIC PANEL
ALBUMIN: 3.9 g/dL (ref 3.5–5.0)
ALK PHOS: 76 U/L (ref 38–126)
ALT: 10 U/L — AB (ref 14–54)
AST: 19 U/L (ref 15–41)
Anion gap: 5 (ref 5–15)
BUN: 33 mg/dL — AB (ref 6–20)
CALCIUM: 9.5 mg/dL (ref 8.9–10.3)
CO2: 20 mmol/L — AB (ref 22–32)
CREATININE: 1.52 mg/dL — AB (ref 0.44–1.00)
Chloride: 115 mmol/L — ABNORMAL HIGH (ref 101–111)
GFR calc non Af Amer: 35 mL/min — ABNORMAL LOW (ref >60)
GFR, EST AFRICAN AMERICAN: 40 mL/min — AB (ref >60)
GLUCOSE: 100 mg/dL — AB (ref 65–99)
Potassium: 4 mmol/L (ref 3.5–5.1)
SODIUM: 140 mmol/L (ref 135–145)
Total Bilirubin: 0.3 mg/dL (ref 0.3–1.2)
Total Protein: 7.7 g/dL (ref 6.5–8.1)

## 2015-01-10 LAB — TSH: TSH: 1.308 u[IU]/mL (ref 0.350–4.500)

## 2015-01-10 MED ORDER — HEPARIN SOD (PORK) LOCK FLUSH 100 UNIT/ML IV SOLN
500.0000 [IU] | Freq: Once | INTRAVENOUS | Status: AC | PRN
  Administered 2015-01-10: 500 [IU]
  Filled 2015-01-10: qty 5

## 2015-01-10 MED ORDER — SODIUM CHLORIDE 0.9 % IV SOLN
Freq: Once | INTRAVENOUS | Status: AC
  Administered 2015-01-10: 12:00:00 via INTRAVENOUS

## 2015-01-10 MED ORDER — SODIUM CHLORIDE 0.9 % IV SOLN
240.0000 mg | Freq: Once | INTRAVENOUS | Status: AC
  Administered 2015-01-10: 240 mg via INTRAVENOUS
  Filled 2015-01-10: qty 8

## 2015-01-10 MED ORDER — SODIUM CHLORIDE 0.9 % IJ SOLN: 10.0000 mL | INTRAMUSCULAR | Status: DC | PRN

## 2015-01-10 NOTE — Progress Notes (Unsigned)
..  [name] [name] arrived today for nivolomab and tolerated well

## 2015-01-10 NOTE — Patient Instructions (Signed)
Discussed orally 
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
## 2015-01-10 NOTE — Patient Instructions (Signed)
..  [facility] Discharge Instructions for Patients Receiving Chemotherapy  Today you received the following chemotherapy agents opdivo  To help prevent nausea and vomiting after your treatment, we encourage you to take your nausea medication.Begin taking it at the first sign of nausea.   If you develop nausea and vomiting, or diarrhea that is not controlled by your medication, call the clinic.  The clinic phone number is [phone]. Office hours are Monday-Friday 8:30am-5:00pm.  BELOW ARE SYMPTOMS THAT SHOULD BE REPORTED IMMEDIATELY:  *FEVER GREATER THAN 101.0 F  *CHILLS WITH OR WITHOUT FEVER  NAUSEA AND VOMITING THAT IS NOT CONTROLLED WITH YOUR NAUSEA MEDICATION  *UNUSUAL SHORTNESS OF BREATH  *UNUSUAL BRUISING OR BLEEDING  TENDERNESS IN MOUTH AND THROAT WITH OR WITHOUT PRESENCE OF ULCERS  *URINARY PROBLEMS  *BOWEL PROBLEMS  UNUSUAL RASH Items with * indicate a potential emergency and should be followed up as soon as possible. If you have an emergency after office hours please contact your primary care physician or go to the nearest emergency department.  Please call the clinic during office hours if you have any questions or concerns.   You may also contact the Patient Navigator at [phone] should you have any questions or need assistance in obtaining follow up care.

## 2015-01-17 ENCOUNTER — Telehealth (HOSPITAL_COMMUNITY): Payer: Self-pay | Admitting: *Deleted

## 2015-01-17 NOTE — Telephone Encounter (Signed)
Lisbeth calling from [employer] asking for clinical information to approve pt Zolpidem Tartrate 5 mg Tablets. Informed [place] that there was a fax from them that approved pt for this medications already. Informed her of the date that office receive it. Lisbeth then stated that she would have to put me on hold and came back on phone and stated that pt is approved but is only approved for 100 tablets for 365 days. Informed Lisbeth that office had spoke with a rep of there's yesterday and she stated that pt was not approved. But information was sent to [facility] and situation was token care of.
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
## 2015-01-20 NOTE — Assessment & Plan Note (Addendum)
Stage IV RCC, on single agent Nivolumab.  Recent imaging on 11/19/2014 at [facility] demonstrated slight increase in mediastinal adenopathy compared to previous exam; no other findings of progressive or new disease.  Oncology history is up to date.    Will continue to follow TSH and manage Synthroid accordingly.  Pre-chemo labs as ordered.  Treatment parameters are met today.  Continued difficulty with her mortality, diagnosis, and prognosis.  Return in 4 weeks for follow-up with continuation of treatment every 2 weeks.

## 2015-01-21 ENCOUNTER — Ambulatory Visit (HOSPITAL_COMMUNITY): Payer: Self-pay | Admitting: Psychiatry

## 2015-01-22 ENCOUNTER — Encounter (HOSPITAL_BASED_OUTPATIENT_CLINIC_OR_DEPARTMENT_OTHER): Payer: PPO

## 2015-01-22 ENCOUNTER — Encounter (HOSPITAL_COMMUNITY): Payer: PPO | Attending: Hematology and Oncology | Admitting: Oncology

## 2015-01-22 ENCOUNTER — Encounter (HOSPITAL_COMMUNITY): Payer: Self-pay | Admitting: Oncology

## 2015-01-22 ENCOUNTER — Telehealth (HOSPITAL_COMMUNITY): Payer: Self-pay | Admitting: Emergency Medicine

## 2015-01-22 VITALS — BP 121/61 | HR 73 | Temp 97.5°F | Resp 18

## 2015-01-22 DIAGNOSIS — R918 Other nonspecific abnormal finding of lung field: Secondary | ICD-10-CM | POA: Insufficient documentation

## 2015-01-22 DIAGNOSIS — K219 Gastro-esophageal reflux disease without esophagitis: Secondary | ICD-10-CM | POA: Diagnosis not present

## 2015-01-22 DIAGNOSIS — R5383 Other fatigue: Secondary | ICD-10-CM | POA: Insufficient documentation

## 2015-01-22 DIAGNOSIS — Z5112 Encounter for antineoplastic immunotherapy: Secondary | ICD-10-CM | POA: Diagnosis not present

## 2015-01-22 DIAGNOSIS — C649 Malignant neoplasm of unspecified kidney, except renal pelvis: Secondary | ICD-10-CM

## 2015-01-22 LAB — COMPREHENSIVE METABOLIC PANEL
ALBUMIN: 3.8 g/dL (ref 3.5–5.0)
ALT: 9 U/L — AB (ref 14–54)
AST: 17 U/L (ref 15–41)
Alkaline Phosphatase: 76 U/L (ref 38–126)
Anion gap: 9 (ref 5–15)
BILIRUBIN TOTAL: 0.5 mg/dL (ref 0.3–1.2)
BUN: 24 mg/dL — AB (ref 6–20)
CO2: 22 mmol/L (ref 22–32)
CREATININE: 1.37 mg/dL — AB (ref 0.44–1.00)
Calcium: 9.7 mg/dL (ref 8.9–10.3)
Chloride: 111 mmol/L (ref 101–111)
GFR calc Af Amer: 46 mL/min — ABNORMAL LOW (ref >60)
GFR, EST NON AFRICAN AMERICAN: 40 mL/min — AB (ref >60)
GLUCOSE: 134 mg/dL — AB (ref 65–99)
Potassium: 3.7 mmol/L (ref 3.5–5.1)
Sodium: 142 mmol/L (ref 135–145)
TOTAL PROTEIN: 7.1 g/dL (ref 6.5–8.1)

## 2015-01-22 LAB — CBC WITH DIFFERENTIAL/PLATELET
BASOS ABS: 0 10*3/uL (ref 0.0–0.1)
BASOS PCT: 0 %
EOS PCT: 3 %
Eosinophils Absolute: 0.2 10*3/uL (ref 0.0–0.7)
HEMATOCRIT: 35.4 % — AB (ref 36.0–46.0)
Hemoglobin: 12.2 g/dL (ref 12.0–15.0)
LYMPHS PCT: 17 %
Lymphs Abs: 1.3 10*3/uL (ref 0.7–4.0)
MCH: 32.8 pg (ref 26.0–34.0)
MCHC: 34.5 g/dL (ref 30.0–36.0)
MCV: 95.2 fL (ref 78.0–100.0)
MONO ABS: 0.4 10*3/uL (ref 0.1–1.0)
MONOS PCT: 5 %
NEUTROS ABS: 5.8 10*3/uL (ref 1.7–7.7)
Neutrophils Relative %: 75 %
Platelets: 188 10*3/uL (ref 150–400)
RBC: 3.72 MIL/uL — ABNORMAL LOW (ref 3.87–5.11)
RDW: 14.4 % (ref 11.5–15.5)
WBC: 7.7 10*3/uL (ref 4.0–10.5)

## 2015-01-22 LAB — TSH: TSH: 1.137 u[IU]/mL (ref 0.350–4.500)

## 2015-01-22 MED ORDER — SODIUM CHLORIDE 0.9 % IV SOLN
Freq: Once | INTRAVENOUS | Status: AC
  Administered 2015-01-22: 12:00:00 via INTRAVENOUS

## 2015-01-22 MED ORDER — HEPARIN SOD (PORK) LOCK FLUSH 100 UNIT/ML IV SOLN
500.0000 [IU] | Freq: Once | INTRAVENOUS | Status: AC | PRN
  Administered 2015-01-22: 500 [IU]

## 2015-01-22 MED ORDER — SODIUM CHLORIDE 0.9 % IV SOLN
240.0000 mg | Freq: Once | INTRAVENOUS | Status: AC
  Administered 2015-01-22: 240 mg via INTRAVENOUS
  Filled 2015-01-22: qty 8

## 2015-01-22 MED ORDER — SODIUM CHLORIDE 0.9 % IJ SOLN
10.0000 mL | INTRAMUSCULAR | Status: DC | PRN
  Administered 2015-01-22: 10 mL
  Filled 2015-01-22: qty 10

## 2015-01-22 MED ORDER — HEPARIN SOD (PORK) LOCK FLUSH 100 UNIT/ML IV SOLN
INTRAVENOUS | Status: AC
  Filled 2015-01-22: qty 5

## 2015-01-22 NOTE — Telephone Encounter (Signed)
-----   Message from Baird Cancer, PA-C sent at 01/22/2015  3:05 PM EST ----- TSH is stable.  No change in thyroid medication.

## 2015-01-22 NOTE — Progress Notes (Signed)
Patient tolerated infusion well.  VSS.   

## 2015-01-22 NOTE — Patient Instructions (Signed)
[place] at [facility] Discharge Instructions  RECOMMENDATIONS MADE BY THE CONSULTANT AND ANY TEST RESULTS WILL BE SENT TO YOUR REFERRING PHYSICIAN.   Exam completed by [name] PA Labs and treatment every 2 weeks Return to see the doctor in 4 weeks Treatment today as scheduled [facility] as scheduled Please call the clinic if you have any questions or concerns    Thank you for choosing [place] at [facility] to provide your oncology and hematology care.  To afford each patient quality time with our provider, please arrive at least 15 minutes before your scheduled appointment time.    You need to re-schedule your appointment should you arrive 10 or more minutes late.  We strive to give you quality time with our providers, and arriving late affects you and other patients whose appointments are after yours.  Also, if you no show three or more times for appointments you may be dismissed from the clinic at the providers discretion.     Again, thank you for choosing [facility].  Our hope is that these requests will decrease the amount of time that you wait before being seen by our physicians.       _____________________________________________________________  Should you have questions after your visit to [facility], please contact our office at [phone] between the hours of 8:30 a.m. and 4:30 p.m.  Voicemails left after 4:30 p.m. will not be returned until the following business day.  For prescription refill requests, have your pharmacy contact our office.

## 2015-01-22 NOTE — Telephone Encounter (Signed)
Notified pt that TSH is stable and to stay on same dose of thyroid medication, pt verbalized understanding

## 2015-01-22 NOTE — Patient Instructions (Signed)
[facility] Discharge Instructions for Patients Receiving Chemotherapy  Today you received the following chemotherapy agents Opdivo. To help prevent nausea and vomiting after your treatment, we encourage you to take your nausea medication as instructed. If you develop nausea and vomiting that is not controlled by your nausea medication, call the clinic. If it is after clinic hours your family physician or the after hours number for the clinic or go to the Emergency Department.  BELOW ARE SYMPTOMS THAT SHOULD BE REPORTED IMMEDIATELY:  *FEVER GREATER THAN 101.0 F  *CHILLS WITH OR WITHOUT FEVER  NAUSEA AND VOMITING THAT IS NOT CONTROLLED WITH YOUR NAUSEA MEDICATION  *UNUSUAL SHORTNESS OF BREATH  *UNUSUAL BRUISING OR BLEEDING  TENDERNESS IN MOUTH AND THROAT WITH OR WITHOUT PRESENCE OF ULCERS  *URINARY PROBLEMS  *BOWEL PROBLEMS  UNUSUAL RASH Items with * indicate a potential emergency and should be followed up as soon as possible.  Return as scheduled.  I have been informed and understand all the instructions given to me. I know to contact the clinic, my physician, or go to the Emergency Department if any problems should occur. I do not have any questions at this time, but understand that I may call the clinic during office hours or the Patient Navigator at [phone] should I have any questions or need assistance in obtaining follow up care.    __________________________________________  _____________  __________ Signature of Patient or Authorized Representative            Date                   Time    __________________________________________ Nurse's Signature  

## 2015-01-23 ENCOUNTER — Ambulatory Visit (INDEPENDENT_AMBULATORY_CARE_PROVIDER_SITE_OTHER): Payer: PPO | Admitting: Psychology

## 2015-01-23 DIAGNOSIS — F418 Other specified anxiety disorders: Secondary | ICD-10-CM

## 2015-01-24 ENCOUNTER — Telehealth (HOSPITAL_COMMUNITY): Payer: Self-pay | Admitting: *Deleted

## 2015-01-24 ENCOUNTER — Ambulatory Visit (INDEPENDENT_AMBULATORY_CARE_PROVIDER_SITE_OTHER): Payer: PPO | Admitting: Psychiatry

## 2015-01-24 ENCOUNTER — Encounter (HOSPITAL_COMMUNITY): Payer: Self-pay | Admitting: Psychiatry

## 2015-01-24 VITALS — BP 135/77 | HR 78 | Ht 66.0 in | Wt 184.2 lb

## 2015-01-24 DIAGNOSIS — F418 Other specified anxiety disorders: Secondary | ICD-10-CM

## 2015-01-24 MED ORDER — ZOLPIDEM TARTRATE 5 MG PO TABS: 5.0000 mg | ORAL_TABLET | Freq: Every evening | ORAL | Status: DC | PRN

## 2015-01-24 MED ORDER — VILAZODONE HCL 40 MG PO TABS: 40.0000 mg | ORAL_TABLET | Freq: Every day | ORAL | Status: DC

## 2015-01-24 MED ORDER — DIAZEPAM 10 MG PO TABS: 10.0000 mg | ORAL_TABLET | Freq: Four times a day (QID) | ORAL | Status: DC

## 2015-01-24 NOTE — Telephone Encounter (Signed)
Called for quantity limit exception at [phone] was told a test claim was ran and it pays 30 for 30 at price of $2.65 per Hart Carwin.

## 2015-01-24 NOTE — Telephone Encounter (Signed)
Pt did say she went to her pharmacy and only paid 2.65 for her Ambien and did receive a letter stating she was approved.
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
## 2015-01-28 ENCOUNTER — Telehealth (HOSPITAL_COMMUNITY): Payer: Self-pay | Admitting: *Deleted

## 2015-01-28 NOTE — Telephone Encounter (Signed)
Please call the drug rep to see if there is anything they can do

## 2015-01-28 NOTE — Telephone Encounter (Signed)
voice message from patient, wants to talk with doctor regarding her medication.

## 2015-01-28 NOTE — Telephone Encounter (Signed)
Pt called stating that her insurance is increasing her price for Viibryd and it is going from $10 now it's increasing to $85.00. Per pt, she don't know what she is going to do. Per pt, she wants to know what Dr. Harrington Challenger wants to do. Per pt, she only received it 3 times last year. Pt number is [phone].

## 2015-01-29 ENCOUNTER — Telehealth (HOSPITAL_COMMUNITY): Payer: Self-pay | Admitting: *Deleted

## 2015-01-29 NOTE — Telephone Encounter (Signed)
phone call from [place] with [facility], please call her she needs to know if patient has a clinical diagnosis of Depression?   EOC Ref. #: [id number].

## 2015-01-29 NOTE — Telephone Encounter (Signed)
phone call from [place] with [facility], please call her she needs to know if patient has a clinical diagnosis of Depression?

## 2015-01-30 NOTE — Telephone Encounter (Signed)
Per drug rep, unfortunately due to pt insurance, there's nothing he can do other then office providing pt samples of this medication.

## 2015-01-30 NOTE — Telephone Encounter (Signed)
Spoke with Blase Mess and provided them with ICD10 code for pt. Letter was faxed of denial and will send it to [place].

## 2015-01-31 ENCOUNTER — Telehealth (HOSPITAL_COMMUNITY): Payer: Self-pay | Admitting: *Deleted

## 2015-01-31 NOTE — Telephone Encounter (Signed)
Called [employer] to speak with "Carly" about appeal process to answer diagnosis question for Viibryd. Carly was unavailable, but left a message with "Corene Cornea" who states he will send her an email asking her to return my call.

## 2015-02-01 ENCOUNTER — Ambulatory Visit (INDEPENDENT_AMBULATORY_CARE_PROVIDER_SITE_OTHER): Payer: PPO | Admitting: Psychiatry

## 2015-02-01 ENCOUNTER — Encounter (HOSPITAL_COMMUNITY): Payer: Self-pay | Admitting: Psychiatry

## 2015-02-01 DIAGNOSIS — F418 Other specified anxiety disorders: Secondary | ICD-10-CM

## 2015-02-01 NOTE — Progress Notes (Signed)
            THERAPIST PROGRESS NOTE  Session Time:       Friday 02/01/2015 9:42 AM - 11:38 AM  Participation Level: Active  Behavioral Response: CasualAlert/ anxious, depressed,tearful    Type of Therapy: Family Therapy  reatment Goals addressed:  1.  Verbalize an increased understanding of the steps to grieving the loss is brought on by her medical condition.      2. Identify feelings associated with medical condition.      3. Learn and implement skills for managing stress.  Interventions: ACT, Supportive  Summary: [name] [name] is a 65 y.o. female who presents with symptoms of depression and anxiety that have been intermittent for several years that have been well controlled until patient was diagnosed with cancer in 2013. Symptoms have worsened in recent months as patient reports constantly thinking about her diagnoses and fears cancer will spread. She has been given a prognosis of 2-4 years to live. Patient reports taking chemotherapy pills and reports extreme fatigue. Patient also experiences depression, anxiety, and crying spells  Patient reports continued stress, depressed mood, crying spells, and anxiety since last session. She denies any suicidal ideations. She and husband continue to argue frequently. They have had their first session with Dr.Rodenbough. Patient was not pleased with session but plans to go to another session. She expresses increased frustration with husband regarding preparing filing their taxes as he has misplaced some the needed information and hasn't started looking for it. She also reports grief/loss issues related to a family friend who has been living with patient for the past two years. The friend moved out suddenly 2 weeks ago without notifying patient. She expresses hurt and sadness. Patient is experiencing increased fear about death as she fears her  current medication regimen for treatment of cancer will end at any time. Per her report, she was  supposed to only take this regimen for 9 months and she has been taking it for a year. She fears doctor will discontinue this when she attends checkup in [place] next month. Patient reports she has been using tools discussed in session but says she still doesn't have any peace.  Suicidal/Homicidal: No    Therapist Response: Therapist works with patient to review symptoms, process feelings and grief/loss issues, identify coping statements, encourage patient to continue marital therapy  Plan: Patient agrees to return for an appointment in 3-4 weeks and complete handouts provided in session.    Diagnosis: Axis I: Depressive Disorder NOS    Axis II: Deferred    BYNUM,PEGGY, LCSW 02/01/2015

## 2015-02-01 NOTE — Patient Instructions (Signed)
Discussed orally 

## 2015-02-01 NOTE — Telephone Encounter (Signed)
noted 

## 2015-02-04 NOTE — Telephone Encounter (Signed)
Spoke with pt and informed her that appeal process was given to per that helps office with prior auth. Also informed pt that once office get response from her insurance then office will contact her. Pt agreed and showed understanding

## 2015-02-05 ENCOUNTER — Encounter (HOSPITAL_COMMUNITY): Payer: Self-pay

## 2015-02-05 ENCOUNTER — Encounter (HOSPITAL_BASED_OUTPATIENT_CLINIC_OR_DEPARTMENT_OTHER): Payer: PPO

## 2015-02-05 VITALS — BP 133/72 | HR 75 | Temp 97.7°F | Resp 18 | Wt 183.3 lb

## 2015-02-05 DIAGNOSIS — Z5112 Encounter for antineoplastic immunotherapy: Secondary | ICD-10-CM

## 2015-02-05 DIAGNOSIS — C7800 Secondary malignant neoplasm of unspecified lung: Secondary | ICD-10-CM

## 2015-02-05 DIAGNOSIS — C649 Malignant neoplasm of unspecified kidney, except renal pelvis: Secondary | ICD-10-CM

## 2015-02-05 LAB — COMPREHENSIVE METABOLIC PANEL
ALBUMIN: 3.9 g/dL (ref 3.5–5.0)
ALK PHOS: 87 U/L (ref 38–126)
ALT: 12 U/L — ABNORMAL LOW (ref 14–54)
AST: 22 U/L (ref 15–41)
Anion gap: 11 (ref 5–15)
BILIRUBIN TOTAL: 0.6 mg/dL (ref 0.3–1.2)
BUN: 25 mg/dL — AB (ref 6–20)
CALCIUM: 10 mg/dL (ref 8.9–10.3)
CO2: 22 mmol/L (ref 22–32)
Chloride: 110 mmol/L (ref 101–111)
Creatinine, Ser: 1.43 mg/dL — ABNORMAL HIGH (ref 0.44–1.00)
GFR calc Af Amer: 43 mL/min — ABNORMAL LOW (ref >60)
GFR calc non Af Amer: 38 mL/min — ABNORMAL LOW (ref >60)
GLUCOSE: 175 mg/dL — AB (ref 65–99)
POTASSIUM: 4.1 mmol/L (ref 3.5–5.1)
Sodium: 143 mmol/L (ref 135–145)
TOTAL PROTEIN: 7.5 g/dL (ref 6.5–8.1)

## 2015-02-05 LAB — CBC WITH DIFFERENTIAL/PLATELET
BASOS ABS: 0 10*3/uL (ref 0.0–0.1)
BASOS PCT: 0 %
Eosinophils Absolute: 0.2 10*3/uL (ref 0.0–0.7)
Eosinophils Relative: 4 %
HEMATOCRIT: 37.6 % (ref 36.0–46.0)
HEMOGLOBIN: 12.4 g/dL (ref 12.0–15.0)
Lymphocytes Relative: 23 %
Lymphs Abs: 1.4 10*3/uL (ref 0.7–4.0)
MCH: 31.8 pg (ref 26.0–34.0)
MCHC: 33 g/dL (ref 30.0–36.0)
MCV: 96.4 fL (ref 78.0–100.0)
Monocytes Absolute: 0.2 10*3/uL (ref 0.1–1.0)
Monocytes Relative: 3 %
NEUTROS ABS: 4.3 10*3/uL (ref 1.7–7.7)
NEUTROS PCT: 70 %
Platelets: 211 10*3/uL (ref 150–400)
RBC: 3.9 MIL/uL (ref 3.87–5.11)
RDW: 14.5 % (ref 11.5–15.5)
WBC: 6.1 10*3/uL (ref 4.0–10.5)

## 2015-02-05 LAB — TSH: TSH: 2.45 u[IU]/mL (ref 0.350–4.500)

## 2015-02-05 MED ORDER — SODIUM CHLORIDE 0.9 % IV SOLN
240.0000 mg | Freq: Once | INTRAVENOUS | Status: AC
  Administered 2015-02-05: 240 mg via INTRAVENOUS
  Filled 2015-02-05: qty 8

## 2015-02-05 MED ORDER — HEPARIN SOD (PORK) LOCK FLUSH 100 UNIT/ML IV SOLN
500.0000 [IU] | Freq: Once | INTRAVENOUS | Status: AC | PRN
  Administered 2015-02-05: 500 [IU]
  Filled 2015-02-05: qty 5

## 2015-02-05 MED ORDER — SODIUM CHLORIDE 0.9 % IJ SOLN
10.0000 mL | INTRAMUSCULAR | Status: DC | PRN
  Administered 2015-02-05: 10 mL
  Filled 2015-02-05: qty 10

## 2015-02-05 MED ORDER — SODIUM CHLORIDE 0.9 % IV SOLN
Freq: Once | INTRAVENOUS | Status: AC
  Administered 2015-02-05: 13:00:00 via INTRAVENOUS

## 2015-02-05 NOTE — Progress Notes (Signed)
Tolerated chemo well. 

## 2015-02-05 NOTE — Patient Instructions (Signed)
..  [facility] Discharge Instructions for Patients Receiving Chemotherapy   Beginning January 23rd 2017 lab work for the [facility] will be done in the  Main lab at [facility] on 1st floor. If you have a lab appointment with the [facility] please come in thru the  Main Entrance and check in at the main information desk   Today you received the following chemotherapy agents opdivo    If you develop nausea and vomiting, or diarrhea that is not controlled by your medication, call the clinic.  The clinic phone number is [phone]. Office hours are Monday-Friday 8:30am-5:00pm.  BELOW ARE SYMPTOMS THAT SHOULD BE REPORTED IMMEDIATELY:  *FEVER GREATER THAN 101.0 F  *CHILLS WITH OR WITHOUT FEVER  NAUSEA AND VOMITING THAT IS NOT CONTROLLED WITH YOUR NAUSEA MEDICATION  *UNUSUAL SHORTNESS OF BREATH  *UNUSUAL BRUISING OR BLEEDING  TENDERNESS IN MOUTH AND THROAT WITH OR WITHOUT PRESENCE OF ULCERS  *URINARY PROBLEMS  *BOWEL PROBLEMS  UNUSUAL RASH Items with * indicate a potential emergency and should be followed up as soon as possible. If you have an emergency after office hours please contact your primary care physician or go to the nearest emergency department.  Please call the clinic during office hours if you have any questions or concerns.   You may also contact the Patient Navigator at [phone] should you have any questions or need assistance in obtaining follow up care.

## 2015-02-12 ENCOUNTER — Telehealth (HOSPITAL_COMMUNITY): Payer: Self-pay | Admitting: Hematology & Oncology

## 2015-02-12 ENCOUNTER — Telehealth (HOSPITAL_COMMUNITY): Payer: Self-pay | Admitting: *Deleted

## 2015-02-12 ENCOUNTER — Ambulatory Visit (INDEPENDENT_AMBULATORY_CARE_PROVIDER_SITE_OTHER): Payer: PPO | Admitting: Psychology

## 2015-02-12 DIAGNOSIS — F418 Other specified anxiety disorders: Secondary | ICD-10-CM | POA: Diagnosis not present

## 2015-02-12 NOTE — Telephone Encounter (Signed)
phone call from patient.  She would like to speak with Dr. Harrington Challenger regarding her  medicine. Viibryd went up to $85.   Insurance gave her names of other medicines.

## 2015-02-12 NOTE — Telephone Encounter (Signed)
PT APPROVED FOR ASSIST FOR OPDIVO. 01/18/15-01/17/16 $11,000.00

## 2015-02-13 ENCOUNTER — Telehealth (HOSPITAL_COMMUNITY): Payer: Self-pay | Admitting: *Deleted

## 2015-02-13 ENCOUNTER — Encounter (HOSPITAL_COMMUNITY): Payer: Self-pay | Admitting: Psychiatry

## 2015-02-13 ENCOUNTER — Other Ambulatory Visit (HOSPITAL_COMMUNITY): Payer: Self-pay | Admitting: Psychiatry

## 2015-02-13 ENCOUNTER — Ambulatory Visit (INDEPENDENT_AMBULATORY_CARE_PROVIDER_SITE_OTHER): Payer: PPO | Admitting: Psychiatry

## 2015-02-13 DIAGNOSIS — F418 Other specified anxiety disorders: Secondary | ICD-10-CM | POA: Diagnosis not present

## 2015-02-13 MED ORDER — PAROXETINE HCL ER 37.5 MG PO TB24: 37.5000 mg | ORAL_TABLET | Freq: Every day | ORAL | Status: DC

## 2015-02-13 NOTE — Telephone Encounter (Signed)
voice message from [place], with [employer] regarding [place].   Please call her.

## 2015-02-13 NOTE — Telephone Encounter (Signed)
Called [phone] spoke with Shirlean Mylar who states that appeal must be done for Viibryd by sending email to [email or web address]. Email was sent asking for expedited review. Asking for a tier exception since cost of Viibryd has increased. Awaiting response.

## 2015-02-13 NOTE — Telephone Encounter (Signed)
Called Rose back. Per Rose, she just wanted to get correct contact information for office if they need to get in touch with office. Per Rose, their turn around time is 72 hr. Per Kalman Shan, she will call office to let office know the decision before a letter is sent out.

## 2015-02-13 NOTE — Telephone Encounter (Signed)
Pt insurance company keeps denying her Viibryd. Per pt [employer], they suggested that she try taking Trazodone 100 mg, 50 mg, 150 mg or Mirtazapine 45 mg. Per pt, she would like to know what to do? Per pt, she can not keep paying $85.00 for her Viibryd. Pt number is [phone]. Pt stated she don't know what to do.

## 2015-02-13 NOTE — Telephone Encounter (Signed)
Pt is aware of conversation with Kalman Shan

## 2015-02-13 NOTE — Telephone Encounter (Signed)
Informed pt and she showed understanding.

## 2015-02-13 NOTE — Patient Instructions (Signed)
Discussed orally 

## 2015-02-13 NOTE — Telephone Encounter (Signed)
Those are not good options, will just make her drowsy. I will send in paxil at a higher dose than she took before, tell her ot pick it up and stop viibryd

## 2015-02-13 NOTE — Progress Notes (Signed)
             THERAPIST PROGRESS NOTE  Session Time:       Tuesday 02/13/2015 10:10 AM - 11:05 AM      Participation Level: Active  Behavioral Response: CasualAlert/ anxious, depressed,tearful    Type of Therapy: Family Therapy  reatment Goals addressed:  1.  Verbalize an increased understanding of the steps to grieving the loss is brought on by her medical condition.      2. Identify feelings associated with medical condition.      3. Learn and implement skills for managing stress.  Interventions: ACT, Supportive  Summary: [name] [name] is a 65 y.o. female who presents with symptoms of depression and anxiety that have been intermittent for several years that have been well controlled until patient was diagnosed with cancer in 2013. Symptoms have worsened in recent months as patient reports constantly thinking about her diagnoses and fears cancer will spread. She has been given a prognosis of 2-4 years to live. Patient reports taking chemotherapy pills and reports extreme fatigue. Patient also experiences depression, anxiety, and crying spells  Patient reports continued stress, depressed mood, crying spells, and anxiety since last session. She denies any suicidal ideations. She says she  and husband argue less frequently. They have had their second session with Dr.Rodenbough. Patient still expresses frustration with sessions but will continue to attend.  was not pleased with session but plans to go to another session. She is becoming increasingly fearful and anxious as the date for her check up at [facility]. She fears she will be given a different medication which signifies to patient she may only have 6 more months to her information about possible medications she may be prescribed. Patient expresses  fear of declining health, possible increased pain, and death and thinks about this constantly.  Suicidal/Homicidal: No    Therapist Response: Therapist works with patient to  review symptoms,encourage patient to continue marital therapy, identify and verbalize fears about having cancer and death, identify ways she is relating to her thoughts and how this is affecting her quality of life  Plan: Patient agrees to return for an appointment in 3-4 weeks and complete handouts provided in session.    Diagnosis: Axis I: Depressive Disorder NOS    Axis II: Deferred    [name] Silvernail, LCSW 02/13/2015

## 2015-02-14 NOTE — Telephone Encounter (Signed)
Pt is aware of process per previous conversations and showed understanding

## 2015-02-14 NOTE — Telephone Encounter (Signed)
Informed pt of this process and she showed understanding

## 2015-02-18 DIAGNOSIS — E785 Hyperlipidemia, unspecified: Secondary | ICD-10-CM | POA: Diagnosis not present

## 2015-02-18 DIAGNOSIS — E041 Nontoxic single thyroid nodule: Secondary | ICD-10-CM | POA: Diagnosis not present

## 2015-02-18 DIAGNOSIS — Z6830 Body mass index (BMI) 30.0-30.9, adult: Secondary | ICD-10-CM | POA: Diagnosis not present

## 2015-02-18 DIAGNOSIS — E119 Type 2 diabetes mellitus without complications: Secondary | ICD-10-CM | POA: Diagnosis not present

## 2015-02-18 DIAGNOSIS — R590 Localized enlarged lymph nodes: Secondary | ICD-10-CM | POA: Diagnosis not present

## 2015-02-18 DIAGNOSIS — C641 Malignant neoplasm of right kidney, except renal pelvis: Secondary | ICD-10-CM | POA: Diagnosis not present

## 2015-02-18 DIAGNOSIS — Z79899 Other long term (current) drug therapy: Secondary | ICD-10-CM | POA: Diagnosis not present

## 2015-02-18 DIAGNOSIS — I10 Essential (primary) hypertension: Secondary | ICD-10-CM | POA: Diagnosis not present

## 2015-02-18 DIAGNOSIS — C649 Malignant neoplasm of unspecified kidney, except renal pelvis: Secondary | ICD-10-CM | POA: Diagnosis not present

## 2015-02-18 DIAGNOSIS — R918 Other nonspecific abnormal finding of lung field: Secondary | ICD-10-CM | POA: Diagnosis not present

## 2015-02-19 ENCOUNTER — Encounter (HOSPITAL_COMMUNITY): Payer: Self-pay | Admitting: Hematology & Oncology

## 2015-02-19 ENCOUNTER — Encounter: Payer: Self-pay | Admitting: *Deleted

## 2015-02-19 ENCOUNTER — Encounter (HOSPITAL_BASED_OUTPATIENT_CLINIC_OR_DEPARTMENT_OTHER): Payer: PPO | Admitting: Hematology & Oncology

## 2015-02-19 ENCOUNTER — Encounter (HOSPITAL_COMMUNITY): Payer: PPO | Attending: Hematology and Oncology

## 2015-02-19 VITALS — BP 150/63 | HR 72 | Temp 97.7°F | Resp 18 | Wt 184.3 lb

## 2015-02-19 VITALS — BP 131/64 | HR 69 | Temp 98.1°F | Resp 16

## 2015-02-19 DIAGNOSIS — R918 Other nonspecific abnormal finding of lung field: Secondary | ICD-10-CM | POA: Insufficient documentation

## 2015-02-19 DIAGNOSIS — C7800 Secondary malignant neoplasm of unspecified lung: Secondary | ICD-10-CM

## 2015-02-19 DIAGNOSIS — C649 Malignant neoplasm of unspecified kidney, except renal pelvis: Secondary | ICD-10-CM | POA: Insufficient documentation

## 2015-02-19 DIAGNOSIS — F329 Major depressive disorder, single episode, unspecified: Secondary | ICD-10-CM

## 2015-02-19 DIAGNOSIS — R5383 Other fatigue: Secondary | ICD-10-CM | POA: Diagnosis not present

## 2015-02-19 DIAGNOSIS — Z5112 Encounter for antineoplastic immunotherapy: Secondary | ICD-10-CM

## 2015-02-19 DIAGNOSIS — K219 Gastro-esophageal reflux disease without esophagitis: Secondary | ICD-10-CM | POA: Insufficient documentation

## 2015-02-19 LAB — CBC WITH DIFFERENTIAL/PLATELET
BASOS ABS: 0 10*3/uL (ref 0.0–0.1)
BASOS PCT: 1 %
EOS ABS: 0.3 10*3/uL (ref 0.0–0.7)
EOS PCT: 4 %
HEMATOCRIT: 36.7 % (ref 36.0–46.0)
Hemoglobin: 12.3 g/dL (ref 12.0–15.0)
LYMPHS ABS: 1.3 10*3/uL (ref 0.7–4.0)
LYMPHS PCT: 19 %
MCH: 32.5 pg (ref 26.0–34.0)
MCHC: 33.5 g/dL (ref 30.0–36.0)
MCV: 97.1 fL (ref 78.0–100.0)
MONO ABS: 0.5 10*3/uL (ref 0.1–1.0)
Monocytes Relative: 7 %
Neutro Abs: 4.9 10*3/uL (ref 1.7–7.7)
Neutrophils Relative %: 70 %
PLATELETS: 229 10*3/uL (ref 150–400)
RBC: 3.78 MIL/uL — ABNORMAL LOW (ref 3.87–5.11)
RDW: 14.5 % (ref 11.5–15.5)
WBC: 7 10*3/uL (ref 4.0–10.5)

## 2015-02-19 LAB — COMPREHENSIVE METABOLIC PANEL
ALBUMIN: 4 g/dL (ref 3.5–5.0)
ALT: 41 U/L (ref 14–54)
ANION GAP: 9 (ref 5–15)
AST: 53 U/L — AB (ref 15–41)
Alkaline Phosphatase: 89 U/L (ref 38–126)
BUN: 36 mg/dL — AB (ref 6–20)
CHLORIDE: 111 mmol/L (ref 101–111)
CO2: 23 mmol/L (ref 22–32)
Calcium: 9.8 mg/dL (ref 8.9–10.3)
Creatinine, Ser: 1.52 mg/dL — ABNORMAL HIGH (ref 0.44–1.00)
GFR calc Af Amer: 40 mL/min — ABNORMAL LOW (ref >60)
GFR calc non Af Amer: 35 mL/min — ABNORMAL LOW (ref >60)
GLUCOSE: 129 mg/dL — AB (ref 65–99)
POTASSIUM: 3.8 mmol/L (ref 3.5–5.1)
SODIUM: 143 mmol/L (ref 135–145)
TOTAL PROTEIN: 7.8 g/dL (ref 6.5–8.1)
Total Bilirubin: 0.6 mg/dL (ref 0.3–1.2)

## 2015-02-19 MED ORDER — SODIUM CHLORIDE 0.9 % IV SOLN
Freq: Once | INTRAVENOUS | Status: AC
  Administered 2015-02-19: 13:00:00 via INTRAVENOUS

## 2015-02-19 MED ORDER — SODIUM CHLORIDE 0.9 % IJ SOLN
10.0000 mL | INTRAMUSCULAR | Status: DC | PRN
  Administered 2015-02-19: 10 mL
  Filled 2015-02-19: qty 10

## 2015-02-19 MED ORDER — SODIUM CHLORIDE 0.9 % IV SOLN
240.0000 mg | Freq: Once | INTRAVENOUS | Status: AC
  Administered 2015-02-19: 240 mg via INTRAVENOUS
  Filled 2015-02-19: qty 20

## 2015-02-19 MED ORDER — HEPARIN SOD (PORK) LOCK FLUSH 100 UNIT/ML IV SOLN
500.0000 [IU] | Freq: Once | INTRAVENOUS | Status: AC | PRN
  Administered 2015-02-19: 500 [IU]
  Filled 2015-02-19: qty 5

## 2015-02-19 NOTE — Progress Notes (Signed)
Tolerated chemo well. Ambulatory on discharge home with husband.

## 2015-02-19 NOTE — Patient Instructions (Signed)
[place] at [facility] Discharge Instructions  RECOMMENDATIONS MADE BY THE CONSULTANT AND ANY TEST RESULTS WILL BE SENT TO YOUR REFERRING PHYSICIAN.   Exam and discussion by Dr Whitney Muse today Chemotherapy today Return to see the doctor in 2 weeks Please call the clinic if you have any questions or concerns    Thank you for choosing [place] at [facility] to provide your oncology and hematology care.  To afford each patient quality time with our provider, please arrive at least 15 minutes before your scheduled appointment time.   Beginning January 23rd 2017 lab work for the [facility] will be done in the  Main lab at [facility] on 1st floor. If you have a lab appointment with the [place] please come in thru the  Main Entrance and check in at the main information desk  You need to re-schedule your appointment should you arrive 10 or more minutes late.  We strive to give you quality time with our providers, and arriving late affects you and other patients whose appointments are after yours.  Also, if you no show three or more times for appointments you may be dismissed from the clinic at the providers discretion.     Again, thank you for choosing [facility].  Our hope is that these requests will decrease the amount of time that you wait before being seen by our physicians.       _____________________________________________________________  Should you have questions after your visit to [facility], please contact our office at [phone] between the hours of 8:30 a.m. and 4:30 p.m.  Voicemails left after 4:30 p.m. will not be returned until the following business day.  For prescription refill requests, have your pharmacy contact our office.
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
## 2015-02-19 NOTE — Progress Notes (Signed)
[place] Clinical Social Work  Clinical Social Work was referred by nurse for assessment of psychosocial needs due to ongoing depression and disagreement with husband today. Clinical Social Worker has met with pt on several visits and met with patient at [facility] to offer support and assess for needs. CSW provided supportive listening and attempted to assist pt in identifying positive aspects of her life. Pt unable to identify things that bring her joy. Pt very tearful and expressed anxieties about end of life, planning for her children when she is gone and relationship concerns with her husband. CSW attempted to discuss the hardships that can come with family around caregiving concerns and extended illness. Husband and family appear to be experiencing common "caregiver burnout" that often occurs with families experiencing a long period of illness.   Pt appears to find comfort through attending church and she plans to continue. She is still interested in completing her ADRs and CSW provided some education around this today. Husband arrived while we were talking and pt and him apologized to one another. CSW will attempt to complete ADRs with pt at her next visit. Pt also educated on ways to get ADRs notarized in the community if needed.   Clinical Social Work interventions: Supportive listening and counseling ADRs education   Carlyon Prows [facility] Tuesdays   [phone]

## 2015-02-19 NOTE — Patient Instructions (Signed)
[facility] Discharge Instructions for Patients Receiving Chemotherapy  Today you received the following chemotherapy agents Opdivo. To help prevent nausea and vomiting after your treatment, we encourage you to take your nausea medication as instructed. If you develop nausea and vomiting that is not controlled by your nausea medication, call the clinic. If it is after clinic hours your family physician or the after hours number for the clinic or go to the Emergency Department.  BELOW ARE SYMPTOMS THAT SHOULD BE REPORTED IMMEDIATELY:  *FEVER GREATER THAN 101.0 F  *CHILLS WITH OR WITHOUT FEVER  NAUSEA AND VOMITING THAT IS NOT CONTROLLED WITH YOUR NAUSEA MEDICATION  *UNUSUAL SHORTNESS OF BREATH  *UNUSUAL BRUISING OR BLEEDING  TENDERNESS IN MOUTH AND THROAT WITH OR WITHOUT PRESENCE OF ULCERS  *URINARY PROBLEMS  *BOWEL PROBLEMS  UNUSUAL RASH Items with * indicate a potential emergency and should be followed up as soon as possible.  Return as scheduled.  I have been informed and understand all the instructions given to me. I know to contact the clinic, my physician, or go to the Emergency Department if any problems should occur. I do not have any questions at this time, but understand that I may call the clinic during office hours or the Patient Navigator at [phone] should I have any questions or need assistance in obtaining follow up care.    __________________________________________  _____________  __________ Signature of Patient or Authorized Representative            Date                   Time    __________________________________________ Nurse's Signature  

## 2015-02-22 ENCOUNTER — Telehealth: Payer: Self-pay | Admitting: *Deleted

## 2015-02-22 NOTE — Telephone Encounter (Signed)
Received [facility] care fax with authorization to have CT scan at [facility] with authorization [id number]  Requesting provider: Cresenciano Genre  Treating provider: [facility]  Place of service: [facility]  PCP: Flonnie Hailstone  Type of service: CT  Procedures: 71260-CT thorax w/dy                      85462-VO abd &pel w/Contrast  Number of visits:1  Start date:02/18/15  End date:08/19/15  Dx:C64.9- Malignant neoplasm of unsp kidney,except renal pelvis

## 2015-02-25 ENCOUNTER — Telehealth (HOSPITAL_COMMUNITY): Payer: Self-pay | Admitting: *Deleted

## 2015-02-25 NOTE — Telephone Encounter (Signed)
patient canceled appointment with Dr. Sima Matas, said they don't think it is helping.

## 2015-02-25 NOTE — Telephone Encounter (Signed)
noted 

## 2015-02-26 NOTE — Telephone Encounter (Signed)
noted 

## 2015-02-27 ENCOUNTER — Ambulatory Visit (HOSPITAL_COMMUNITY): Payer: Self-pay | Admitting: Psychology

## 2015-03-05 ENCOUNTER — Encounter (HOSPITAL_COMMUNITY): Payer: Self-pay | Admitting: Oncology

## 2015-03-05 ENCOUNTER — Encounter (HOSPITAL_BASED_OUTPATIENT_CLINIC_OR_DEPARTMENT_OTHER): Payer: PPO | Admitting: Oncology

## 2015-03-05 ENCOUNTER — Encounter (HOSPITAL_COMMUNITY): Payer: PPO

## 2015-03-05 VITALS — BP 120/57 | HR 62 | Temp 97.4°F | Resp 18 | Wt 183.5 lb

## 2015-03-05 DIAGNOSIS — C7800 Secondary malignant neoplasm of unspecified lung: Secondary | ICD-10-CM | POA: Diagnosis not present

## 2015-03-05 DIAGNOSIS — C649 Malignant neoplasm of unspecified kidney, except renal pelvis: Secondary | ICD-10-CM

## 2015-03-05 MED ORDER — OXYCODONE HCL 5 MG PO TABS: 5.0000 mg | ORAL_TABLET | Freq: Four times a day (QID) | ORAL | Status: DC | PRN

## 2015-03-05 NOTE — Assessment & Plan Note (Addendum)
Stage IV RCC, on single agent Nivolumab, currently on hold.  Recent imaging on 02/18/2015 at [facility] demonstrated slight progression of disease.  Oncology history is updated.    Dr. Vito Berger did discuss the patient with [place].  The patient;s case was discussed with GU tumor board at [facility] and the recommendation is to hold Nivolumab therapy for 6-8 weeks to determine if she truly is having progression of disease, given the extremely slow nature of this "progression."  If she is proven to have progressive disease, then Nivolumab re-start would be considered.  She has a follow-up appointment with Dr. Vito Berger and repeat imaging on 04/15/2015.  Will continue to follow TSH and manage Synthroid accordingly.  Continued difficulty with her mortality, diagnosis, and prognosis.  Patient was tearful during discussion today and her husband help assuage her some.  He understands the current situation and plan.  Scheduled the patient was complicated with her tearfulness. I reviewed her imaging study with her and her husband. She dedicated that there is minimal progression of disease and other areas of her cancer very stable. She repeatedly asked me to attempt to predict her future plan and this is very difficult.  She is educated that I do not have that ability.  Her future plan will depend on her upcoming scans in April. She asks me to predict how she will die with regards to her malignancy and she is educated this too is difficult to predict.  I have refilled her Oxycodone.  This was last filled in November 2016.  Return in the 1st or 2nd week of April following her appointment at [facility].  I plan on discussing CODE STATUS with the patient today but this was hindered due to her inability to converse and clearly think due to being overwhelmed with information provided her today. Initially, she denied being informed of the future plan from her physician at [facility], but after reviewing of the chart and care everywhere where  is well documented that she's been called on 2 separate occasions since her CT scans in February, she is informed that she's received at least to phone calls from [facility]. One is a documented telephone encounter from Dr. [name] in another is from a nurse. The patient's husband reports that this is true she cannot quite remember these conversations. It is noted within care everywhere that she has a follow-up appointment including CT scans on April 3 at [facility].
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
## 2015-03-05 NOTE — Progress Notes (Signed)
Patient not treated today per MD discretion.

## 2015-03-05 NOTE — Patient Instructions (Addendum)
[place] at [facility] Discharge Instructions  RECOMMENDATIONS MADE BY THE CONSULTANT AND ANY TEST RESULTS WILL BE SENT TO YOUR REFERRING PHYSICIAN.   Exam and discussion completed by [name] today Return to see the doctor as scheduled on April 18th. Please call the clinic if you have any questions or concerns     Thank you for choosing [place] at [facility] to provide your oncology and hematology care.  To afford each patient quality time with our provider, please arrive at least 15 minutes before your scheduled appointment time.   Beginning January 23rd 2017 lab work for the [facility] will be done in the  Main lab at [facility] on 1st floor. If you have a lab appointment with the [place] please come in thru the  Main Entrance and check in at the main information desk  You need to re-schedule your appointment should you arrive 10 or more minutes late.  We strive to give you quality time with our providers, and arriving late affects you and other patients whose appointments are after yours.  Also, if you no show three or more times for appointments you may be dismissed from the clinic at the providers discretion.     Again, thank you for choosing [facility].  Our hope is that these requests will decrease the amount of time that you wait before being seen by our physicians.       _____________________________________________________________  Should you have questions after your visit to [facility], please contact our office at [phone] between the hours of 8:30 a.m. and 4:30 p.m.  Voicemails left after 4:30 p.m. will not be returned until the following business day.  For prescription refill requests, have your pharmacy contact our office.

## 2015-03-07 ENCOUNTER — Encounter (HOSPITAL_COMMUNITY): Payer: Self-pay | Admitting: Psychiatry

## 2015-03-07 ENCOUNTER — Ambulatory Visit (INDEPENDENT_AMBULATORY_CARE_PROVIDER_SITE_OTHER): Payer: PPO | Admitting: Psychiatry

## 2015-03-07 DIAGNOSIS — F418 Other specified anxiety disorders: Secondary | ICD-10-CM

## 2015-03-07 NOTE — Patient Instructions (Signed)
Discussed orally 

## 2015-03-07 NOTE — Progress Notes (Signed)
             THERAPIST PROGRESS NOTE  Session Time:        Thursday 03/07/2015 11:05 AM - 11:53 AM     Participation Level: Active  Behavioral Response: CasualAlert/ anxious, depressed,tearful    Type of Therapy: Family Therapy  reatment Goals addressed:  1.  Verbalize an increased understanding of the steps to grieving the loss is brought on by her medical condition.      2. Identify feelings associated with medical condition.      3. Learn and implement skills for managing stress.  Interventions: ACT, Supportive  Summary: [name] [name] is a 65 y.o. female who presents with symptoms of depression and anxiety that have been intermittent for several years that have been well controlled until patient was diagnosed with cancer in 2013. Symptoms have worsened in recent months as patient reports constantly thinking about her diagnoses and fears cancer will spread. She has been given a prognosis of 2-4 years to live. Patient reports taking chemotherapy pills and reports extreme fatigue. Patient also experiences depression, anxiety, and crying spells  Patient reports continued stress, depressed mood, crying spells, and anxiety since last session. She says chemo medication was discontinued at her last appointment in [facility] 2 weeks ago and she was not given as replacement medication. Doctors have given her a year at best to live per her report. She expresses fear and anger at God. She reports she and husband stopped arguing for awhile but resumed having conflict yesterday. They have decided to discontinue therapy with psychologist Dr. Sima Matas. Patient expresses continued frustration with her daughter and her sister.   Suicidal/Homicidal: No    Therapist Response: Therapist works with patient to review symptoms, discuss referral for marital therapy with another therapist and provide patient with contact information for therapist Dr. Vivi Martens in [place], facilitate expression of fear and  anger, encourage patient to begin journaling and bring to next session, try to help patient identify values to pursue values based living  Plan: Patient agrees to return for an appointment in 2-3 weeks, begin journaling and bring to session,     Diagnosis: Axis I: Depressive Disorder NOS    Axis II: Deferred    Destry Dauber, LCSW 03/07/2015

## 2015-03-13 ENCOUNTER — Other Ambulatory Visit: Payer: Self-pay | Admitting: Physician Assistant

## 2015-03-13 NOTE — Telephone Encounter (Signed)
Refill appropriate and filled per protocol. 

## 2015-03-19 ENCOUNTER — Inpatient Hospital Stay (HOSPITAL_COMMUNITY): Payer: Self-pay

## 2015-03-20 ENCOUNTER — Other Ambulatory Visit: Payer: Self-pay | Admitting: Physician Assistant

## 2015-03-21 NOTE — Telephone Encounter (Signed)
Medication refilled per protocol. 

## 2015-03-22 ENCOUNTER — Encounter (HOSPITAL_COMMUNITY): Payer: Self-pay | Admitting: Psychiatry

## 2015-03-22 ENCOUNTER — Ambulatory Visit (INDEPENDENT_AMBULATORY_CARE_PROVIDER_SITE_OTHER): Payer: PPO | Admitting: Psychiatry

## 2015-03-22 VITALS — BP 132/70 | HR 75 | Ht 66.0 in | Wt 183.2 lb

## 2015-03-22 DIAGNOSIS — F329 Major depressive disorder, single episode, unspecified: Secondary | ICD-10-CM

## 2015-03-22 DIAGNOSIS — F418 Other specified anxiety disorders: Secondary | ICD-10-CM

## 2015-03-22 MED ORDER — PAROXETINE HCL ER 37.5 MG PO TB24: 37.5000 mg | ORAL_TABLET | Freq: Every day | ORAL | Status: DC

## 2015-03-22 MED ORDER — ZOLPIDEM TARTRATE 5 MG PO TABS: 5.0000 mg | ORAL_TABLET | Freq: Every evening | ORAL | Status: DC | PRN

## 2015-03-22 MED ORDER — DIAZEPAM 10 MG PO TABS: 10.0000 mg | ORAL_TABLET | Freq: Four times a day (QID) | ORAL | Status: DC
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
## 2015-03-28 ENCOUNTER — Encounter: Payer: Self-pay | Admitting: *Deleted

## 2015-03-28 ENCOUNTER — Ambulatory Visit (INDEPENDENT_AMBULATORY_CARE_PROVIDER_SITE_OTHER): Payer: PPO | Admitting: Psychiatry

## 2015-03-28 DIAGNOSIS — F418 Other specified anxiety disorders: Secondary | ICD-10-CM | POA: Diagnosis not present

## 2015-03-28 NOTE — Progress Notes (Signed)
             THERAPIST PROGRESS NOTE  Session Time:        Thursday 03/28/2015 2:00 PM - 2:50 PM                   Participation Level: Active  Behavioral Response: CasualAlert/ anxious, less depressed,    Type of Therapy: Family Therapy  reatment Goals addressed:  1.  Verbalize an increased understanding of the steps to grieving the loss is brought on by her medical condition.      2. Identify feelings associated with medical condition.      3. Learn and implement skills for managing stress.  Interventions: ACT, Supportive  Summary: [name] [name] is a 65 y.o. female who presents with symptoms of depression and anxiety that have been intermittent for several years that have been well controlled until patient was diagnosed with cancer in 2013. Symptoms have worsened in recent months as patient reports constantly thinking about her diagnoses and fears cancer will spread. She has been given a prognosis of 2-4 years to live. Patient reports taking chemotherapy pills and reports extreme fatigue. Patient also experiences depression, anxiety, and crying spells  Patient reports decreased stress, less depressed mood, and decreased crying spells since last session. She continues to worry about upcoming follow up appointment in [facility] and her status but states trying not to think about it all the time. She has been trying to put her mind on other things. She has been journaling and reading a spiritual book which have been helpful. She reports decreased arguing with her husband and states she has learned how to just not say anything about some things. She also reports decreased negative interaction with children and says she has stopped fussing with them or trying to push them to do things. Instead, she has been using spirituality and praying. Patient expresses increased acceptance of others around her making their own choices and being responsible for their consequences.   Suicidal/Homicidal:  No    Therapist Response: Therapist worked with patient to review symptoms, praise and reinforce patient's use of journaling and spirituality, listened to patient read excerpts from journal and discuss the effects from journaling and identified ways to include information regarding thoughts and feelings,  discuss the effects of change in her response to others on her and her interaction with others,  Plan: Patient agrees to return for an appointment in 2-3 weeks, continue journaling and bring to session,     Diagnosis: Axis I: Depressive Disorder NOS    Axis II: Deferred    [place], [place] 03/28/2015

## 2015-03-28 NOTE — Patient Instructions (Signed)
Discussed orally 

## 2015-03-28 NOTE — Progress Notes (Signed)
[place] Clinical Social Work  Clinical Social Work was referred by patient for completion of ADRs  . Clinical Social Worker returned pt's call and left message that CSW glad to complete at appt at [facility] on 04/30/15. Pt has had packet to be completed for several months. CSW and pt have talked several times about how to get packet completed in local community by notary, if she does not want to wait to get CSW to notarize packet. CSW plans to follow up with pt at future appointment.       Clinical Social Work interventions: ADR education  Loren Racer, [place] Tuesdays   [phone]

## 2015-04-02 ENCOUNTER — Inpatient Hospital Stay (HOSPITAL_COMMUNITY): Payer: Self-pay

## 2015-04-05 ENCOUNTER — Encounter: Payer: Self-pay | Admitting: *Deleted

## 2015-04-05 NOTE — Progress Notes (Signed)
[place] Clinical Social Work  Clinical Social Work was referred by patient for assessment of psychosocial needs due to request to get ADRs completed. Clinical Social Worker contacted patient via phone to offer support and assess for needs.  CSW left message for pt re CSW availability to get ADRs completed at either [facility] or [facility].   Pt also attended Living With Cancer Support group on 04/04/15 and participated in group activities with other patients. CSW will continue to follow and assist.     Clinical Social Work interventions: Group attendance ADR education  Loren Racer, [place] Tuesdays   [phone]

## 2015-04-09 ENCOUNTER — Encounter: Payer: Self-pay | Admitting: *Deleted

## 2015-04-09 NOTE — Progress Notes (Signed)
[place] Work  Clinical Social Work was referred by pt  to review and complete healthcare advance directives.  Clinical Social Worker met with patient and husband, Mortimer Fries in lobby.  The patient designated Ruari Duggan as their primary healthcare agent and daughter, Manuela Schwartz as their secondary agent.  Patient also completed healthcare living will.    Clinical Social Worker notarized documents and made copies for patient/family. Clinical Social Worker will send documents to medical records to be scanned into patient's chart. Clinical Social Worker encouraged patient/family to contact with any additional questions or concerns.  Loren Racer, [place] Tuesdays   [phone]

## 2015-04-09 NOTE — Progress Notes (Signed)
[place] Clinical Social Work  Clinical Social Work was referred by patient for assessment of psychosocial needs due to advanced cancer. Clinical Social Worker met with patient to offer support and assess for needs.  ADRs completed and sent to scan. Pt is able to more fully discuss her cancer and prognosis. She is attempting to find meaning in her daily life. Both her and husband are very interested in additional support resources and CSW provided them with additional information to assist.     Clinical Social Work interventions: Resource education Supportive listening ADR completion  [place], [place] Tuesdays   [phone]

## 2015-04-11 ENCOUNTER — Encounter: Payer: Self-pay | Admitting: *Deleted

## 2015-04-11 ENCOUNTER — Other Ambulatory Visit: Payer: Self-pay | Admitting: *Deleted

## 2015-04-11 ENCOUNTER — Telehealth: Payer: Self-pay | Admitting: *Deleted

## 2015-04-11 NOTE — Patient Outreach (Signed)
[place] [facility]) Care Management  04/11/2015  LATRONDA SPINK 11/19/1950 [id number]  Referral from [employer] High Risk List:  Telephone to patient; left message on cell phone voice mail requesting return call.  Plan: Will follow up.  Sherrin Daisy, RN BSN [place] Management Coordinator [facility] Care Management  [phone]

## 2015-04-11 NOTE — Telephone Encounter (Signed)
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

## 2015-04-12 ENCOUNTER — Encounter (HOSPITAL_COMMUNITY): Payer: Self-pay

## 2015-04-15 ENCOUNTER — Other Ambulatory Visit: Payer: Self-pay | Admitting: *Deleted

## 2015-04-15 DIAGNOSIS — Z85528 Personal history of other malignant neoplasm of kidney: Secondary | ICD-10-CM | POA: Diagnosis not present

## 2015-04-15 DIAGNOSIS — Z6830 Body mass index (BMI) 30.0-30.9, adult: Secondary | ICD-10-CM | POA: Diagnosis not present

## 2015-04-15 DIAGNOSIS — E119 Type 2 diabetes mellitus without complications: Secondary | ICD-10-CM | POA: Diagnosis not present

## 2015-04-15 DIAGNOSIS — Z905 Acquired absence of kidney: Secondary | ICD-10-CM | POA: Diagnosis not present

## 2015-04-15 DIAGNOSIS — R918 Other nonspecific abnormal finding of lung field: Secondary | ICD-10-CM | POA: Diagnosis not present

## 2015-04-15 DIAGNOSIS — I82221 Chronic embolism and thrombosis of inferior vena cava: Secondary | ICD-10-CM | POA: Diagnosis not present

## 2015-04-15 DIAGNOSIS — R590 Localized enlarged lymph nodes: Secondary | ICD-10-CM | POA: Diagnosis not present

## 2015-04-15 DIAGNOSIS — I10 Essential (primary) hypertension: Secondary | ICD-10-CM | POA: Diagnosis not present

## 2015-04-15 DIAGNOSIS — C7800 Secondary malignant neoplasm of unspecified lung: Secondary | ICD-10-CM | POA: Diagnosis not present

## 2015-04-15 DIAGNOSIS — E041 Nontoxic single thyroid nodule: Secondary | ICD-10-CM | POA: Diagnosis not present

## 2015-04-15 DIAGNOSIS — C641 Malignant neoplasm of right kidney, except renal pelvis: Secondary | ICD-10-CM | POA: Diagnosis not present

## 2015-04-15 DIAGNOSIS — E785 Hyperlipidemia, unspecified: Secondary | ICD-10-CM | POA: Diagnosis not present

## 2015-04-15 NOTE — Patient Outreach (Signed)
[place] [facility]) Care Management  04/15/2015  RAEONNA MILO [birth year] [id number]   Telephone call to patient who was advised of reason for call & [facility] care management services. States she would not be able to talk now because she is at [employer] office out of town  for extensive testing.  Requesting call back later in week.  Plan:  Will follow up. Sherrin Daisy, RN BSN [place] Management Coordinator [facility] Care Management  [phone]

## 2015-04-16 ENCOUNTER — Other Ambulatory Visit (HOSPITAL_COMMUNITY): Payer: Self-pay | Admitting: Oncology

## 2015-04-18 ENCOUNTER — Inpatient Hospital Stay (HOSPITAL_COMMUNITY): Payer: Self-pay

## 2015-04-18 ENCOUNTER — Encounter: Payer: Self-pay | Admitting: *Deleted

## 2015-04-18 ENCOUNTER — Other Ambulatory Visit (HOSPITAL_COMMUNITY): Payer: Self-pay | Admitting: Hematology & Oncology

## 2015-04-18 ENCOUNTER — Other Ambulatory Visit: Payer: Self-pay | Admitting: *Deleted

## 2015-04-18 NOTE — Patient Outreach (Signed)
[facility] ([facility]) Care Management  04/18/2015  Jaleeya J Monter 08/06/1950 [id number]   Telephone call to patient who was advised of reason for call & [facility] care management services. Patient gave HIPPA verification and consented to screening assessment.  Subjective: States major health issue now is lung cancer. Voices is currently undergoing chemotherapy treatment at [facility] on outpatient basis every 2 weeks. States she is seeing specialists locally & out of town in [place]. Seeing primary care provider at least once yearly. Sees psychiatrist every 2-3 months for medication management for depression seeing therapist every 2-3 weeks for scheduled appointments.   States has support from family members-spouse, daughter and grandson. Spouse takes her to all of doctors' appointment. Getting all of medications from local pharmacy without problems. States husband fixes her medications and she takes medications as instructed by her doctors consistently. Voices understanding of medication adherence. States daughter and spouse do all of cooking & grocery shopping.  States she is independent in her personal care and walks without problems. Has walker & cane to use if ever needed.   Patient states all of healthcare needs are being met by her doctors and other services at cancer center.  States does not need [facility] services at this time.   Patient consents to receive contact information for [facility] Care Management    Assessment: Has family support. Has access to doctors (primary care, specialists). Has access to therapist and social worker.  No problems with transportation or obtaining medications.  No case management needs at this time.  Plan:  Send [facility] contact information topatient. Send MD closure letter. Send to care management assistant to close out.  Linda Manning, RN BSN [facility] Care Management Coordinator [facility] Care Management  [phone]      

## 2015-04-19 ENCOUNTER — Encounter (HOSPITAL_COMMUNITY): Payer: PPO | Attending: Hematology and Oncology

## 2015-04-19 VITALS — BP 136/70 | HR 72 | Temp 97.4°F | Resp 18 | Wt 178.7 lb

## 2015-04-19 DIAGNOSIS — Z5112 Encounter for antineoplastic immunotherapy: Secondary | ICD-10-CM | POA: Diagnosis not present

## 2015-04-19 DIAGNOSIS — C649 Malignant neoplasm of unspecified kidney, except renal pelvis: Secondary | ICD-10-CM | POA: Insufficient documentation

## 2015-04-19 DIAGNOSIS — R918 Other nonspecific abnormal finding of lung field: Secondary | ICD-10-CM | POA: Insufficient documentation

## 2015-04-19 DIAGNOSIS — C7800 Secondary malignant neoplasm of unspecified lung: Secondary | ICD-10-CM | POA: Diagnosis not present

## 2015-04-19 DIAGNOSIS — R5383 Other fatigue: Secondary | ICD-10-CM | POA: Insufficient documentation

## 2015-04-19 DIAGNOSIS — K219 Gastro-esophageal reflux disease without esophagitis: Secondary | ICD-10-CM | POA: Diagnosis not present

## 2015-04-19 LAB — COMPREHENSIVE METABOLIC PANEL
ALBUMIN: 3.9 g/dL (ref 3.5–5.0)
ALT: 10 U/L — ABNORMAL LOW (ref 14–54)
ANION GAP: 10 (ref 5–15)
AST: 19 U/L (ref 15–41)
Alkaline Phosphatase: 80 U/L (ref 38–126)
BILIRUBIN TOTAL: 0.6 mg/dL (ref 0.3–1.2)
BUN: 26 mg/dL — ABNORMAL HIGH (ref 6–20)
CO2: 21 mmol/L — ABNORMAL LOW (ref 22–32)
Calcium: 10 mg/dL (ref 8.9–10.3)
Chloride: 108 mmol/L (ref 101–111)
Creatinine, Ser: 1.48 mg/dL — ABNORMAL HIGH (ref 0.44–1.00)
GFR, EST AFRICAN AMERICAN: 42 mL/min — AB (ref >60)
GFR, EST NON AFRICAN AMERICAN: 36 mL/min — AB (ref >60)
GLUCOSE: 118 mg/dL — AB (ref 65–99)
POTASSIUM: 3.9 mmol/L (ref 3.5–5.1)
Sodium: 139 mmol/L (ref 135–145)
TOTAL PROTEIN: 7.6 g/dL (ref 6.5–8.1)

## 2015-04-19 LAB — CBC WITH DIFFERENTIAL/PLATELET
BASOS PCT: 0 %
Basophils Absolute: 0 10*3/uL (ref 0.0–0.1)
Eosinophils Absolute: 0.2 10*3/uL (ref 0.0–0.7)
Eosinophils Relative: 2 %
HEMATOCRIT: 35.1 % — AB (ref 36.0–46.0)
Hemoglobin: 12 g/dL (ref 12.0–15.0)
Lymphocytes Relative: 14 %
Lymphs Abs: 1.2 10*3/uL (ref 0.7–4.0)
MCH: 32.7 pg (ref 26.0–34.0)
MCHC: 34.2 g/dL (ref 30.0–36.0)
MCV: 95.6 fL (ref 78.0–100.0)
MONO ABS: 0.6 10*3/uL (ref 0.1–1.0)
MONOS PCT: 7 %
NEUTROS ABS: 6.7 10*3/uL (ref 1.7–7.7)
Neutrophils Relative %: 77 %
Platelets: 236 10*3/uL (ref 150–400)
RBC: 3.67 MIL/uL — ABNORMAL LOW (ref 3.87–5.11)
RDW: 14.2 % (ref 11.5–15.5)
WBC: 8.7 10*3/uL (ref 4.0–10.5)

## 2015-04-19 MED ORDER — SODIUM CHLORIDE 0.9 % IJ SOLN
10.0000 mL | INTRAMUSCULAR | Status: DC | PRN
  Administered 2015-04-19: 10 mL
  Filled 2015-04-19: qty 10

## 2015-04-19 MED ORDER — HEPARIN SOD (PORK) LOCK FLUSH 100 UNIT/ML IV SOLN
500.0000 [IU] | Freq: Once | INTRAVENOUS | Status: AC | PRN
  Administered 2015-04-19: 500 [IU]
  Filled 2015-04-19: qty 5

## 2015-04-19 MED ORDER — SODIUM CHLORIDE 0.9 % IV SOLN
240.0000 mg | Freq: Once | INTRAVENOUS | Status: AC
  Administered 2015-04-19: 240 mg via INTRAVENOUS
  Filled 2015-04-19: qty 4

## 2015-04-19 MED ORDER — SODIUM CHLORIDE 0.9 % IV SOLN
Freq: Once | INTRAVENOUS | Status: AC
  Administered 2015-04-19: 13:00:00 via INTRAVENOUS

## 2015-04-19 NOTE — Patient Instructions (Signed)
[facility] Discharge Instructions for Patients Receiving Chemotherapy   Beginning January 23rd 2017 lab work for the [facility] will be done in the  Main lab at [facility] on 1st floor. If you have a lab appointment with the [facility] please come in thru the  Main Entrance and check in at the main information desk   Today you received the following chemotherapy agents Opdivo.  To help prevent nausea and vomiting after your treatment, we encourage you to take your nausea medication as instructed.   If you develop nausea and vomiting, or diarrhea that is not controlled by your medication, call the clinic.  The clinic phone number is [phone]. Office hours are Monday-Friday 8:30am-5:00pm.  BELOW ARE SYMPTOMS THAT SHOULD BE REPORTED IMMEDIATELY:  *FEVER GREATER THAN 101.0 F  *CHILLS WITH OR WITHOUT FEVER  NAUSEA AND VOMITING THAT IS NOT CONTROLLED WITH YOUR NAUSEA MEDICATION  *UNUSUAL SHORTNESS OF BREATH  *UNUSUAL BRUISING OR BLEEDING  TENDERNESS IN MOUTH AND THROAT WITH OR WITHOUT PRESENCE OF ULCERS  *URINARY PROBLEMS  *BOWEL PROBLEMS  UNUSUAL RASH Items with * indicate a potential emergency and should be followed up as soon as possible. If you have an emergency after office hours please contact your primary care physician or go to the nearest emergency department.  Please call the clinic during office hours if you have any questions or concerns.   You may also contact the Patient Navigator at [phone] should you have any questions or need assistance in obtaining follow up care.  Resources For Cancer Patients and their Caregivers ? American Cancer Society: Can assist with transportation, wigs, general needs, runs Look Good Feel Better.        ([phone] ? Cancer Care: Provides financial assistance, online support groups, medication/co-pay assistance.  1-800-813-HOPE 973-082-2276) ? [place] Assists [place]  cancer patients and their families through emotional , educational and financial support.  3617219337 ? [place] DSS Where to apply for food stamps, Medicaid and utility assistance. 519-113-3800 ? [employer]: Transportation to medical appointments. (312)140-2536 ? Social Security Administration: May apply for disability if have a Stage IV cancer. 513-859-6568 574-403-1516 ? [employer], Disability and Transit Services: Assists with nutrition, care and transit needs. [phone]

## 2015-04-19 NOTE — Progress Notes (Signed)
Tolerated chemo well. Ambulatory on discharge home with husband.

## 2015-04-22 ENCOUNTER — Encounter (HOSPITAL_COMMUNITY): Payer: Self-pay | Admitting: Psychiatry

## 2015-04-22 ENCOUNTER — Ambulatory Visit (INDEPENDENT_AMBULATORY_CARE_PROVIDER_SITE_OTHER): Payer: PPO | Admitting: Psychiatry

## 2015-04-22 DIAGNOSIS — F418 Other specified anxiety disorders: Secondary | ICD-10-CM

## 2015-04-22 NOTE — Patient Instructions (Signed)
Discussed orally 

## 2015-04-22 NOTE — Progress Notes (Signed)
    THERAPIST PROGRESS NOTE  Session Time:        Monday 04/22/2015 11:07 AM -  11:55 AM                Participation Level: Active  Behavioral Response: CasualAlert/ anxious, less depressed,    Type of Therapy: Family Therapy  reatment Goals addressed:  1.  Verbalize an increased understanding of the steps to grieving the loss is brought on by her medical condition.      2. Identify feelings associated with medical condition.      3. Learn and implement skills for managing stress.  Interventions: ACT, Supportive  Summary: [name] [name] is a 65 y.o. female who presents with symptoms of depression and anxiety that have been intermittent for several years that have been well controlled until patient was diagnosed with cancer in 2013. Symptoms have worsened in recent months as patient reports constantly thinking about her diagnoses and fears cancer will spread. She has been given a prognosis of 2-4 years to live. Patient reports taking chemotherapy pills and reports extreme fatigue. Patient also experiences depression, anxiety, and crying spells  Patient reports continuing to feel better since last session. She reports last appointment at [facility] was positive. She has resumed treatment and is having her infusions at [facility]. Patient is more hopeful about her health. She also recently attended a cancer support group at [facility]. She reports continued improved communication in her marriage and says she and her husband have been getting along better. She states continuing to just not say anything about some things. She is continuing to journal to express her feelings. She also has had more social involvement with a friend with whom she talks on the phone and goes out to eat occasionally. She expresses sadness that her children are not more involved with her but expresses increased acceptance of this. She continues to express frustration she is not able to do some of the activities she  wants performed.  Suicidal/Homicidal: No    Therapist Response: reviewed symptoms, praise and reinforce patient's use of journaling and spirituality, praise and reinforce patient's increased social involvement, facilitated expression of feelings regarding her health and functioning, explored ways to focus on living in the moment including mindfulness activities  Plan: Patient agrees to return for an appointment in 2-3 weeks, continue journaling and bring to session, practice mindfulness activity discussed in session    Diagnosis: Axis I: Depressive Disorder NOS    Axis II: Deferred    [name], LCSW 04/22/2015

## 2015-04-23 ENCOUNTER — Other Ambulatory Visit (HOSPITAL_COMMUNITY): Payer: Self-pay | Admitting: Hematology & Oncology

## 2015-04-30 ENCOUNTER — Encounter (HOSPITAL_COMMUNITY): Payer: Self-pay | Admitting: Hematology & Oncology

## 2015-04-30 ENCOUNTER — Encounter (HOSPITAL_BASED_OUTPATIENT_CLINIC_OR_DEPARTMENT_OTHER): Payer: PPO | Admitting: Hematology & Oncology

## 2015-04-30 ENCOUNTER — Encounter (HOSPITAL_COMMUNITY): Payer: Self-pay | Admitting: Psychology

## 2015-04-30 ENCOUNTER — Encounter: Payer: Self-pay | Admitting: *Deleted

## 2015-04-30 ENCOUNTER — Other Ambulatory Visit: Payer: Self-pay | Admitting: Physician Assistant

## 2015-04-30 VITALS — BP 127/67 | HR 71 | Temp 98.1°F | Resp 18 | Wt 178.0 lb

## 2015-04-30 DIAGNOSIS — F329 Major depressive disorder, single episode, unspecified: Secondary | ICD-10-CM | POA: Diagnosis not present

## 2015-04-30 DIAGNOSIS — C7800 Secondary malignant neoplasm of unspecified lung: Secondary | ICD-10-CM

## 2015-04-30 DIAGNOSIS — C649 Malignant neoplasm of unspecified kidney, except renal pelvis: Secondary | ICD-10-CM | POA: Diagnosis not present

## 2015-04-30 DIAGNOSIS — F32A Depression, unspecified: Secondary | ICD-10-CM

## 2015-04-30 NOTE — Progress Notes (Signed)
[facility] Clinical Social Work  Clinical Social Work was referred by need for CSW follow up.  Clinical Social Worker met with patient and husband briefly at  [facility] to offer support and assess for needs. Pt was calm today and seemed to be in brighter spirits. CSW educated pt about upcoming painting class that is next week. Pt and husband will consider attending. Pt denied other needs currently.  CSW to continue to follow and assist.   Clinical Social Work interventions: Check in  [place], [place] Tuesdays   [phone]

## 2015-04-30 NOTE — Patient Instructions (Addendum)
[place] at [facility] Discharge Instructions  RECOMMENDATIONS MADE BY THE CONSULTANT AND ANY TEST RESULTS WILL BE SENT TO YOUR REFERRING PHYSICIAN.   Exam and discussion by Dr Whitney Muse today Treatment on Friday Treatment in 2 weeks Return to see the doctor in 2 weeks Please call the clinic if you have any questions or concerns    Thank you for choosing [facility] at [facility] to provide your oncology and hematology care.  To afford each patient quality time with our provider, please arrive at least 15 minutes before your scheduled appointment time.   Beginning January 23rd 2017 lab work for the [facility] will be done in the  Main lab at [facility] on 1st floor. If you have a lab appointment with the [facility] please come in thru the  Main Entrance and check in at the main information desk  You need to re-schedule your appointment should you arrive 10 or more minutes late.  We strive to give you quality time with our providers, and arriving late affects you and other patients whose appointments are after yours.  Also, if you no show three or more times for appointments you may be dismissed from the clinic at the providers discretion.     Again, thank you for choosing [facility].  Our hope is that these requests will decrease the amount of time that you wait before being seen by our physicians.       _____________________________________________________________  Should you have questions after your visit to [facility], please contact our office at [phone] between the hours of 8:30 a.m. and 4:30 p.m.  Voicemails left after 4:30 p.m. will not be returned until the following business day.  For prescription refill requests, have your pharmacy contact our office.         Resources For Cancer Patients and their Caregivers ? American Cancer Society: Can assist with transportation, wigs, general  needs, runs Look Good Feel Better.        450-196-3404 ? Cancer Care: Provides financial assistance, online support groups, medication/co-pay assistance.  1-800-813-HOPE 226-720-5275) ? [place] Assists [place] cancer patients and their families through emotional , educational and financial support.  423-016-6956 ? [place] DSS Where to apply for food stamps, Medicaid and utility assistance. 4152581482 ? [employer]: Transportation to medical appointments. 657-202-4110 ? Social Security Administration: May apply for disability if have a Stage IV cancer. [phone] (661)526-2002 ? [employer], Disability and Transit Services: Assists with nutrition, care and transit needs. [phone]
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
## 2015-04-30 NOTE — Progress Notes (Signed)
The patient and her husband come in today for the first marital therapy session. The patient was rather resistant throughout the session but she husband both acknowledge that there are a lot of marital conflict. The patient spends a lot of time recounting of wide range and list of difficulties that she has had and complaints about her husband is not there for her or she feels like he is not there for her. The patient's husband reports that he simply is overwhelmed by all of his wife's difficulties and is unable to do anything that seems to satisfy her help her. There is clearly a lot of conflict between the 2 people. However, the patient herself was quite resistant to anything that was said throughout the session essentially responding as it she was being attacked even when there are great efforts made to address issues openly and not lay blame on anyone.

## 2015-04-30 NOTE — Progress Notes (Signed)
The patient appeared to be even more resistant today than her first visit. We spent a lot of time trying to build rapport and have the patient and her husband understand the particular goals of therapeutic interventions we were dealing with family. However, she constantly wanted to be very centered focus on her issues and how her husband never did anything for her. He acknowledges that he has voiced her especially when she becomes controlling and manipulative. The patient reports that she has never manipulative of him. However, any time where there was a question about how she was responding to things she immediately became resistant.

## 2015-05-01 NOTE — Telephone Encounter (Signed)
Medication refilled per protocol. 

## 2015-05-02 ENCOUNTER — Inpatient Hospital Stay (HOSPITAL_COMMUNITY): Payer: Self-pay

## 2015-05-03 ENCOUNTER — Encounter (HOSPITAL_BASED_OUTPATIENT_CLINIC_OR_DEPARTMENT_OTHER): Payer: PPO

## 2015-05-03 VITALS — BP 131/41 | HR 66 | Temp 98.0°F | Resp 18 | Wt 180.7 lb

## 2015-05-03 DIAGNOSIS — Z5112 Encounter for antineoplastic immunotherapy: Secondary | ICD-10-CM | POA: Diagnosis not present

## 2015-05-03 DIAGNOSIS — C649 Malignant neoplasm of unspecified kidney, except renal pelvis: Secondary | ICD-10-CM

## 2015-05-03 DIAGNOSIS — C7800 Secondary malignant neoplasm of unspecified lung: Secondary | ICD-10-CM

## 2015-05-03 LAB — CBC WITH DIFFERENTIAL/PLATELET
Basophils Absolute: 0 10*3/uL (ref 0.0–0.1)
Basophils Relative: 0 %
EOS PCT: 5 %
Eosinophils Absolute: 0.4 10*3/uL (ref 0.0–0.7)
HEMATOCRIT: 34.6 % — AB (ref 36.0–46.0)
Hemoglobin: 11.7 g/dL — ABNORMAL LOW (ref 12.0–15.0)
LYMPHS ABS: 1.3 10*3/uL (ref 0.7–4.0)
LYMPHS PCT: 17 %
MCH: 32.1 pg (ref 26.0–34.0)
MCHC: 33.8 g/dL (ref 30.0–36.0)
MCV: 95.1 fL (ref 78.0–100.0)
MONO ABS: 0.3 10*3/uL (ref 0.1–1.0)
MONOS PCT: 3 %
NEUTROS ABS: 5.9 10*3/uL (ref 1.7–7.7)
Neutrophils Relative %: 75 %
Platelets: 233 10*3/uL (ref 150–400)
RBC: 3.64 MIL/uL — ABNORMAL LOW (ref 3.87–5.11)
RDW: 14 % (ref 11.5–15.5)
WBC: 7.9 10*3/uL (ref 4.0–10.5)

## 2015-05-03 LAB — COMPREHENSIVE METABOLIC PANEL
ALT: 13 U/L — ABNORMAL LOW (ref 14–54)
ANION GAP: 13 (ref 5–15)
AST: 16 U/L (ref 15–41)
Albumin: 3.7 g/dL (ref 3.5–5.0)
Alkaline Phosphatase: 66 U/L (ref 38–126)
BILIRUBIN TOTAL: 0.4 mg/dL (ref 0.3–1.2)
BUN: 35 mg/dL — AB (ref 6–20)
CHLORIDE: 108 mmol/L (ref 101–111)
CO2: 18 mmol/L — ABNORMAL LOW (ref 22–32)
Calcium: 10 mg/dL (ref 8.9–10.3)
Creatinine, Ser: 1.75 mg/dL — ABNORMAL HIGH (ref 0.44–1.00)
GFR, EST AFRICAN AMERICAN: 34 mL/min — AB (ref >60)
GFR, EST NON AFRICAN AMERICAN: 29 mL/min — AB (ref >60)
Glucose, Bld: 216 mg/dL — ABNORMAL HIGH (ref 65–99)
POTASSIUM: 3.8 mmol/L (ref 3.5–5.1)
Sodium: 139 mmol/L (ref 135–145)
TOTAL PROTEIN: 7.4 g/dL (ref 6.5–8.1)

## 2015-05-03 MED ORDER — SODIUM CHLORIDE 0.9 % IV SOLN
Freq: Once | INTRAVENOUS | Status: AC
  Administered 2015-05-03: 13:00:00 via INTRAVENOUS

## 2015-05-03 MED ORDER — SODIUM CHLORIDE 0.9 % IV SOLN
240.0000 mg | Freq: Once | INTRAVENOUS | Status: AC
  Administered 2015-05-03: 240 mg via INTRAVENOUS
  Filled 2015-05-03: qty 24

## 2015-05-03 MED ORDER — SODIUM CHLORIDE 0.9 % IJ SOLN
10.0000 mL | INTRAMUSCULAR | Status: DC | PRN
  Administered 2015-05-03: 10 mL
  Filled 2015-05-03: qty 10

## 2015-05-03 MED ORDER — HEPARIN SOD (PORK) LOCK FLUSH 100 UNIT/ML IV SOLN
500.0000 [IU] | Freq: Once | INTRAVENOUS | Status: AC | PRN
  Administered 2015-05-03: 500 [IU]

## 2015-05-03 MED ORDER — HEPARIN SOD (PORK) LOCK FLUSH 100 UNIT/ML IV SOLN
INTRAVENOUS | Status: AC
  Filled 2015-05-03: qty 5

## 2015-05-03 NOTE — Progress Notes (Signed)
Tolerated chemo well. Discharged home with husband via wheelchair.

## 2015-05-03 NOTE — Patient Instructions (Signed)
[facility] Discharge Instructions for Patients Receiving Chemotherapy   Beginning January 23rd 2017 lab work for the [facility] will be done in the  Main lab at [facility] on 1st floor. If you have a lab appointment with the [facility] please come in thru the  Main Entrance and check in at the main information desk   Today you received the following chemotherapy agents Opdivo.  To help prevent nausea and vomiting after your treatment, we encourage you to take your nausea medication as instructed.  If you develop nausea and vomiting, or diarrhea that is not controlled by your medication, call the clinic.  The clinic phone number is [phone]. Office hours are Monday-Friday 8:30am-5:00pm.  BELOW ARE SYMPTOMS THAT SHOULD BE REPORTED IMMEDIATELY:  *FEVER GREATER THAN 101.0 F  *CHILLS WITH OR WITHOUT FEVER  NAUSEA AND VOMITING THAT IS NOT CONTROLLED WITH YOUR NAUSEA MEDICATION  *UNUSUAL SHORTNESS OF BREATH  *UNUSUAL BRUISING OR BLEEDING  TENDERNESS IN MOUTH AND THROAT WITH OR WITHOUT PRESENCE OF ULCERS  *URINARY PROBLEMS  *BOWEL PROBLEMS  UNUSUAL RASH Items with * indicate a potential emergency and should be followed up as soon as possible. If you have an emergency after office hours please contact your primary care physician or go to the nearest emergency department.  Please call the clinic during office hours if you have any questions or concerns.   You may also contact the Patient Navigator at [phone] should you have any questions or need assistance in obtaining follow up care.  Resources For Cancer Patients and their Caregivers ? American Cancer Society: Can assist with transportation, wigs, general needs, runs Look Good Feel Better.        719 599 2927 ? Cancer Care: Provides financial assistance, online support groups, medication/co-pay assistance.  1-800-813-HOPE 289-819-7775) ? [facility] [place] Assists [place]  cancer patients and their families through emotional , educational and financial support.  6717266956 ? [place] DSS Where to apply for food stamps, Medicaid and utility assistance. 954-276-9690 ? [facility]: Transportation to medical appointments. 5595265980 ? Social Security Administration: May apply for disability if have a Stage IV cancer. ([phone] 4015375964 ? [employer], Disability and Transit Services: Assists with nutrition, care and transit needs. 475-260-4623

## 2015-05-13 ENCOUNTER — Ambulatory Visit (INDEPENDENT_AMBULATORY_CARE_PROVIDER_SITE_OTHER): Payer: PPO | Admitting: Psychiatry

## 2015-05-13 ENCOUNTER — Encounter (HOSPITAL_COMMUNITY): Payer: Self-pay | Admitting: Psychiatry

## 2015-05-13 DIAGNOSIS — F418 Other specified anxiety disorders: Secondary | ICD-10-CM

## 2015-05-13 NOTE — Patient Instructions (Signed)
Discussed orally 

## 2015-05-13 NOTE — Progress Notes (Signed)
     THERAPIST PROGRESS NOTE  Session Time:        Monday 05/13/2015  2:10 PM  - 2:58 PM                       Participation Level: Active  Behavioral Response: CasualAlert/ anxious, tearful    Type of Therapy: Individual Therapy  reatment Goals addressed:  1.  Verbalize an increased understanding of the steps to grieving the loss is brought on by her medical condition.      2. Identify feelings associated with medical condition.      3. Learn and implement skills for managing stress.  Interventions: ACT, Supportive  Summary: [name] [name] is a 65 y.o. female who presents with symptoms of depression and anxiety that have been intermittent for several years that have been well controlled until patient was diagnosed with cancer in 2013. Symptoms have worsened in recent months as patient reports constantly thinking about her diagnoses and fears cancer will spread. She has been given a prognosis of 2-4 years to live. Patient reports taking chemotherapy pills and reports extreme fatigue. Patient also experiences depression, anxiety, and crying spells  Patient reports doing well since last session until this past weekend. She expresses sadness about  granddaughter moving 12 miles away and now patient is seeing her less frequently. She reports receiving letter from her daughter regarding expenses which triggered increased worry, regret, and sadness for patient about her daughter's future and well-being. She reports additional stress regarding husband as he has been more drowsy and fatigued than usual per patient's report. This has triggered increased fear as atient has been catastrophizing about husband's health. Patient has resumed attending church regularly and has been attending cancer support group once per month. She also has continued to use journaling and her spirituality.   Suicidal/Homicidal: No    Therapist Response: reviewed symptoms, praised and reinforce patient's use of journaling and  spirituality, praise and reinforce patient's increased social involvement, discussed connection between thoughts/mood/behavior, assisted patient identify negative thought patterns and replace with more realistic/healthy thought patterns   Plan: Patient agrees to return for an appointment in 2-3 weeks, continue journaling and bring to session    Diagnosis: Axis I: Depressive Disorder NOS    Axis II: Deferred    [place], [place] 05/13/2015

## 2015-05-14 ENCOUNTER — Other Ambulatory Visit (HOSPITAL_BASED_OUTPATIENT_CLINIC_OR_DEPARTMENT_OTHER): Payer: Self-pay

## 2015-05-14 ENCOUNTER — Other Ambulatory Visit: Payer: Self-pay

## 2015-05-14 DIAGNOSIS — Z1231 Encounter for screening mammogram for malignant neoplasm of breast: Secondary | ICD-10-CM

## 2015-05-17 ENCOUNTER — Encounter (HOSPITAL_BASED_OUTPATIENT_CLINIC_OR_DEPARTMENT_OTHER): Payer: PPO

## 2015-05-17 ENCOUNTER — Encounter (HOSPITAL_COMMUNITY): Payer: Self-pay | Admitting: Hematology & Oncology

## 2015-05-17 ENCOUNTER — Encounter (HOSPITAL_COMMUNITY): Payer: PPO | Attending: Hematology and Oncology | Admitting: Hematology & Oncology

## 2015-05-17 VITALS — BP 135/58 | HR 69 | Temp 97.8°F | Resp 18 | Wt 177.3 lb

## 2015-05-17 DIAGNOSIS — C7800 Secondary malignant neoplasm of unspecified lung: Secondary | ICD-10-CM | POA: Diagnosis not present

## 2015-05-17 DIAGNOSIS — Z5112 Encounter for antineoplastic immunotherapy: Secondary | ICD-10-CM

## 2015-05-17 DIAGNOSIS — K219 Gastro-esophageal reflux disease without esophagitis: Secondary | ICD-10-CM | POA: Diagnosis not present

## 2015-05-17 DIAGNOSIS — C649 Malignant neoplasm of unspecified kidney, except renal pelvis: Secondary | ICD-10-CM | POA: Insufficient documentation

## 2015-05-17 DIAGNOSIS — F32A Depression, unspecified: Secondary | ICD-10-CM

## 2015-05-17 DIAGNOSIS — F329 Major depressive disorder, single episode, unspecified: Secondary | ICD-10-CM | POA: Diagnosis not present

## 2015-05-17 DIAGNOSIS — R918 Other nonspecific abnormal finding of lung field: Secondary | ICD-10-CM | POA: Insufficient documentation

## 2015-05-17 DIAGNOSIS — E032 Hypothyroidism due to medicaments and other exogenous substances: Secondary | ICD-10-CM

## 2015-05-17 DIAGNOSIS — R5383 Other fatigue: Secondary | ICD-10-CM | POA: Diagnosis not present

## 2015-05-17 LAB — COMPREHENSIVE METABOLIC PANEL
ALBUMIN: 3.9 g/dL (ref 3.5–5.0)
ALT: 11 U/L — AB (ref 14–54)
AST: 14 U/L — AB (ref 15–41)
Alkaline Phosphatase: 78 U/L (ref 38–126)
Anion gap: 9 (ref 5–15)
BILIRUBIN TOTAL: 0.5 mg/dL (ref 0.3–1.2)
BUN: 42 mg/dL — AB (ref 6–20)
CHLORIDE: 112 mmol/L — AB (ref 101–111)
CO2: 19 mmol/L — ABNORMAL LOW (ref 22–32)
CREATININE: 2.19 mg/dL — AB (ref 0.44–1.00)
Calcium: 10.5 mg/dL — ABNORMAL HIGH (ref 8.9–10.3)
GFR calc Af Amer: 26 mL/min — ABNORMAL LOW (ref >60)
GFR, EST NON AFRICAN AMERICAN: 22 mL/min — AB (ref >60)
GLUCOSE: 130 mg/dL — AB (ref 65–99)
POTASSIUM: 3.8 mmol/L (ref 3.5–5.1)
Sodium: 140 mmol/L (ref 135–145)
Total Protein: 7.8 g/dL (ref 6.5–8.1)

## 2015-05-17 LAB — CBC WITH DIFFERENTIAL/PLATELET
BASOS ABS: 0 10*3/uL (ref 0.0–0.1)
BASOS PCT: 0 %
Eosinophils Absolute: 0.6 10*3/uL (ref 0.0–0.7)
Eosinophils Relative: 6 %
HEMATOCRIT: 35.1 % — AB (ref 36.0–46.0)
Hemoglobin: 11.8 g/dL — ABNORMAL LOW (ref 12.0–15.0)
LYMPHS PCT: 15 %
Lymphs Abs: 1.3 10*3/uL (ref 0.7–4.0)
MCH: 31.5 pg (ref 26.0–34.0)
MCHC: 33.6 g/dL (ref 30.0–36.0)
MCV: 93.6 fL (ref 78.0–100.0)
Monocytes Absolute: 0.5 10*3/uL (ref 0.1–1.0)
Monocytes Relative: 6 %
NEUTROS ABS: 6.5 10*3/uL (ref 1.7–7.7)
Neutrophils Relative %: 73 %
PLATELETS: 227 10*3/uL (ref 150–400)
RBC: 3.75 MIL/uL — AB (ref 3.87–5.11)
RDW: 14.3 % (ref 11.5–15.5)
WBC: 9 10*3/uL (ref 4.0–10.5)

## 2015-05-17 MED ORDER — SODIUM CHLORIDE 0.9 % IV SOLN
240.0000 mg | Freq: Once | INTRAVENOUS | Status: AC
  Administered 2015-05-17: 240 mg via INTRAVENOUS
  Filled 2015-05-17: qty 20

## 2015-05-17 MED ORDER — HEPARIN SOD (PORK) LOCK FLUSH 100 UNIT/ML IV SOLN
500.0000 [IU] | Freq: Once | INTRAVENOUS | Status: AC | PRN
  Administered 2015-05-17: 500 [IU]
  Filled 2015-05-17 (×2): qty 5

## 2015-05-17 MED ORDER — SODIUM CHLORIDE 0.9 % IJ SOLN
10.0000 mL | INTRAMUSCULAR | Status: DC | PRN
  Administered 2015-05-17: 10 mL
  Filled 2015-05-17: qty 10

## 2015-05-17 MED ORDER — SODIUM CHLORIDE 0.9 % IV SOLN
Freq: Once | INTRAVENOUS | Status: AC
  Administered 2015-05-17: 11:00:00 via INTRAVENOUS
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
## 2015-05-17 NOTE — Progress Notes (Signed)
Tolerated chemotherapy well today.  Discharged via wheelchair

## 2015-05-17 NOTE — Patient Instructions (Addendum)
[place] at [facility] Discharge Instructions  RECOMMENDATIONS MADE BY THE CONSULTANT AND ANY TEST RESULTS WILL BE SENT TO YOUR REFERRING PHYSICIAN.   Exam and discussion by Dr Whitney Muse today opdivo today opdivo every 2 weeks  Return to see the doctor in 2 weeks  Please call the clinic if you have any questions or concerns     Thank you for choosing [place] at [facility] to provide your oncology and hematology care.  To afford each patient quality time with our provider, please arrive at least 15 minutes before your scheduled appointment time.   Beginning January 23rd 2017 lab work for the [facility] will be done in the  Main lab at [facility] on 1st floor. If you have a lab appointment with the [facility] please come in thru the  Main Entrance and check in at the main information desk  You need to re-schedule your appointment should you arrive 10 or more minutes late.  We strive to give you quality time with our providers, and arriving late affects you and other patients whose appointments are after yours.  Also, if you no show three or more times for appointments you may be dismissed from the clinic at the providers discretion.     Again, thank you for choosing [facility].  Our hope is that these requests will decrease the amount of time that you wait before being seen by our physicians.       _____________________________________________________________  Should you have questions after your visit to [facility], please contact our office at [phone] between the hours of 8:30 a.m. and 4:30 p.m.  Voicemails left after 4:30 p.m. will not be returned until the following business day.  For prescription refill requests, have your pharmacy contact our office.         Resources For Cancer Patients and their Caregivers ? American Cancer Society: Can assist with transportation, wigs, general needs,  runs Look Good Feel Better.        ([phone] 495-0990 ? Cancer Care: Provides financial assistance, online support groups, medication/co-pay assistance.  1-800-813-HOPE 289-155-9834) ? [place] Assists [place] cancer patients and their families through emotional , educational and financial support.  804-463-6326 ? [place] DSS Where to apply for food stamps, Medicaid and utility assistance. (605)694-8504 ? [employer]: Transportation to medical appointments. 310-128-3216 ? Social Security Administration: May apply for disability if have a Stage IV cancer. ([phone] 231-1474 562-044-8072 ? [employer], Disability and Transit Services: Assists with nutrition, care and transit needs. (937)644-2017

## 2015-05-17 NOTE — Patient Instructions (Signed)
[facility] Discharge Instructions for Patients Receiving Chemotherapy   Beginning January 23rd 2017 lab work for the [facility] will be done in the  Main lab at [facility] on 1st floor. If you have a lab appointment with the [facility] please come in thru the  Main Entrance and check in at the main information desk   Today you received the following chemotherapy agents opdivo Kidney function elevated--increase your fluid intake  To help prevent nausea and vomiting after your treatment, we encourage you to take your nausea medication     If you develop nausea and vomiting, or diarrhea that is not controlled by your medication, call the clinic.  The clinic phone number is [phone]. Office hours are Monday-Friday 8:30am-5:00pm.  BELOW ARE SYMPTOMS THAT SHOULD BE REPORTED IMMEDIATELY:  *FEVER GREATER THAN 101.0 F  *CHILLS WITH OR WITHOUT FEVER  NAUSEA AND VOMITING THAT IS NOT CONTROLLED WITH YOUR NAUSEA MEDICATION  *UNUSUAL SHORTNESS OF BREATH  *UNUSUAL BRUISING OR BLEEDING  TENDERNESS IN MOUTH AND THROAT WITH OR WITHOUT PRESENCE OF ULCERS  *URINARY PROBLEMS  *BOWEL PROBLEMS  UNUSUAL RASH Items with * indicate a potential emergency and should be followed up as soon as possible. If you have an emergency after office hours please contact your primary care physician or go to the nearest emergency department.  Please call the clinic during office hours if you have any questions or concerns.   You may also contact the Patient Navigator at [phone] should you have any questions or need assistance in obtaining follow up care.      Resources For Cancer Patients and their Caregivers ? American Cancer Society: Can assist with transportation, wigs, general needs, runs Look Good Feel Better.        (272)557-1915 ? Cancer Care: Provides financial assistance, online support groups, medication/co-pay assistance.  1-800-813-HOPE 713-207-7999) ? [place] Assists [place] cancer patients and their families through emotional , educational and financial support.  901 120 2839 ? [place] DSS Where to apply for food stamps, Medicaid and utility assistance. 434-811-7024 ? [employer]: Transportation to medical appointments. 360-849-3080 ? Social Security Administration: May apply for disability if have a Stage IV cancer. 772-326-3358 318-252-9102 ? [employer], Disability and Transit Services: Assists with nutrition, care and transit needs. ([phone] 705-5042

## 2015-05-21 ENCOUNTER — Encounter (HOSPITAL_COMMUNITY): Payer: Self-pay | Admitting: Hematology & Oncology

## 2015-05-21 ENCOUNTER — Encounter (HOSPITAL_COMMUNITY): Payer: PPO

## 2015-05-21 ENCOUNTER — Other Ambulatory Visit (HOSPITAL_COMMUNITY): Payer: Self-pay | Admitting: Oncology

## 2015-05-21 DIAGNOSIS — C649 Malignant neoplasm of unspecified kidney, except renal pelvis: Secondary | ICD-10-CM | POA: Diagnosis not present

## 2015-05-21 LAB — CALCIUM: Calcium: 11.2 mg/dL — ABNORMAL HIGH (ref 8.9–10.3)

## 2015-05-22 ENCOUNTER — Other Ambulatory Visit (HOSPITAL_COMMUNITY): Payer: Self-pay | Admitting: Oncology

## 2015-05-22 DIAGNOSIS — C649 Malignant neoplasm of unspecified kidney, except renal pelvis: Secondary | ICD-10-CM

## 2015-05-24 ENCOUNTER — Ambulatory Visit: Payer: Self-pay

## 2015-05-24 ENCOUNTER — Encounter (HOSPITAL_COMMUNITY): Payer: Self-pay

## 2015-05-24 ENCOUNTER — Encounter (HOSPITAL_BASED_OUTPATIENT_CLINIC_OR_DEPARTMENT_OTHER): Payer: PPO

## 2015-05-24 ENCOUNTER — Other Ambulatory Visit (HOSPITAL_COMMUNITY): Payer: Self-pay

## 2015-05-24 ENCOUNTER — Other Ambulatory Visit (HOSPITAL_COMMUNITY): Payer: Self-pay | Admitting: Hematology & Oncology

## 2015-05-24 DIAGNOSIS — C7800 Secondary malignant neoplasm of unspecified lung: Secondary | ICD-10-CM | POA: Diagnosis not present

## 2015-05-24 DIAGNOSIS — C649 Malignant neoplasm of unspecified kidney, except renal pelvis: Secondary | ICD-10-CM | POA: Diagnosis not present

## 2015-05-24 LAB — CALCIUM: CALCIUM: 10.4 mg/dL — AB (ref 8.9–10.3)

## 2015-05-24 MED ORDER — SODIUM CHLORIDE 0.9 % IV SOLN
INTRAVENOUS | Status: DC
  Administered 2015-05-24: 10:00:00 via INTRAVENOUS

## 2015-05-24 MED ORDER — HEPARIN SOD (PORK) LOCK FLUSH 100 UNIT/ML IV SOLN
INTRAVENOUS | Status: AC
Start: 2015-05-24 — End: 2015-05-24
  Filled 2015-05-24: qty 5

## 2015-05-24 MED ORDER — HEPARIN SOD (PORK) LOCK FLUSH 100 UNIT/ML IV SOLN
500.0000 [IU] | Freq: Once | INTRAVENOUS | Status: AC
  Administered 2015-05-24: 500 [IU] via INTRAVENOUS

## 2015-05-24 MED ORDER — ZOLEDRONIC ACID 4 MG/5ML IV CONC
3.0000 mg | Freq: Once | INTRAVENOUS | Status: AC
  Administered 2015-05-24: 3 mg via INTRAVENOUS
  Filled 2015-05-24: qty 3.75

## 2015-05-24 NOTE — Progress Notes (Signed)
[name] [name] Tolerated zometa well today Discharged via wheelchair

## 2015-05-24 NOTE — Patient Instructions (Signed)
[place] [facility] Discharge Instructions  RECOMMENDATIONS MADE BY THE CONSULTANT AND ANY TEST RESULTS WILL BE SENT TO YOUR REFERRING PHYSICIAN.  zometa today Follow up as scheduled   Zoledronic Acid injection (Hypercalcemia, Oncology) What is this medicine? ZOLEDRONIC ACID (ZOE le dron ik AS id) lowers the amount of calcium loss from bone. It is used to treat too much calcium in your blood from cancer. It is also used to prevent complications of cancer that has spread to the bone. This medicine may be used for other purposes; ask your health care provider or pharmacist if you have questions. What should I tell my health care provider before I take this medicine? They need to know if you have any of these conditions: -aspirin-sensitive asthma -cancer, especially if you are receiving medicines used to treat cancer -dental disease or wear dentures -infection -kidney disease -receiving corticosteroids like dexamethasone or prednisone -an unusual or allergic reaction to zoledronic acid, other medicines, foods, dyes, or preservatives -pregnant or trying to get pregnant -breast-feeding How should I use this medicine? This medicine is for infusion into a vein. It is given by a health care professional in a hospital or clinic setting. Talk to your pediatrician regarding the use of this medicine in children. Special care may be needed. Overdosage: If you think you have taken too much of this medicine contact a poison control center or emergency room at once. NOTE: This medicine is only for you. Do not share this medicine with others. What if I miss a dose? It is important not to miss your dose. Call your doctor or health care professional if you are unable to keep an appointment. What may interact with this medicine? -certain antibiotics given by injection -NSAIDs, medicines for pain and inflammation, like ibuprofen or naproxen -some diuretics like bumetanide,  furosemide -teriparatide -thalidomide This list may not describe all possible interactions. Give your health care provider a list of all the medicines, herbs, non-prescription drugs, or dietary supplements you use. Also tell them if you smoke, drink alcohol, or use illegal drugs. Some items may interact with your medicine. What should I watch for while using this medicine? Visit your doctor or health care professional for regular checkups. It may be some time before you see the benefit from this medicine. Do not stop taking your medicine unless your doctor tells you to. Your doctor may order blood tests or other tests to see how you are doing. Women should inform their doctor if they wish to become pregnant or think they might be pregnant. There is a potential for serious side effects to an unborn child. Talk to your health care professional or pharmacist for more information. You should make sure that you get enough calcium and vitamin D while you are taking this medicine. Discuss the foods you eat and the vitamins you take with your health care professional. Some people who take this medicine have severe bone, joint, and/or muscle pain. This medicine may also increase your risk for jaw problems or a broken thigh bone. Tell your doctor right away if you have severe pain in your jaw, bones, joints, or muscles. Tell your doctor if you have any pain that does not go away or that gets worse. Tell your dentist and dental surgeon that you are taking this medicine. You should not have major dental surgery while on this medicine. See your dentist to have a dental exam and fix any dental problems before starting this medicine. Take  good care of your teeth while on this medicine. Make sure you see your dentist for regular follow-up appointments. What side effects may I notice from receiving this medicine? Side effects that you should report to your doctor or health care professional as soon as possible: -allergic  reactions like skin rash, itching or hives, swelling of the face, lips, or tongue -anxiety, confusion, or depression -breathing problems -changes in vision -eye pain -feeling faint or lightheaded, falls -jaw pain, especially after dental work -mouth sores -muscle cramps, stiffness, or weakness -redness, blistering, peeling or loosening of the skin, including inside the mouth -trouble passing urine or change in the amount of urine Side effects that usually do not require medical attention (report to your doctor or health care professional if they continue or are bothersome): -bone, joint, or muscle pain -constipation -diarrhea -fever -hair loss -irritation at site where injected -loss of appetite -nausea, vomiting -stomach upset -trouble sleeping -trouble swallowing -weak or tired This list may not describe all possible side effects. Call your doctor for medical advice about side effects. You may report side effects to FDA at 1-800-FDA-1088. Where should I keep my medicine? This drug is given in a hospital or clinic and will not be stored at home. NOTE: This sheet is a summary. It may not cover all possible information. If you have questions about this medicine, talk to your doctor, pharmacist, or health care provider.    2016, Elsevier/Gold Standard. (2013-05-27 14:19:39)    Thank you for choosing [place] at [facility] to provide your oncology and hematology care.  To afford each patient quality time with our provider, please arrive at least 15 minutes before your scheduled appointment time.   Beginning January 23rd 2017 lab work for the [facility] will be done in the  Main lab at [facility] on 1st floor. If you have a lab appointment with the [place] please come in thru the  Main Entrance and check in at the main information desk  You need to re-schedule your appointment should you arrive 10 or more minutes late.  We strive to give you  quality time with our providers, and arriving late affects you and other patients whose appointments are after yours.  Also, if you no show three or more times for appointments you may be dismissed from the clinic at the providers discretion.     Again, thank you for choosing [facility].  Our hope is that these requests will decrease the amount of time that you wait before being seen by our physicians.       _____________________________________________________________  Should you have questions after your visit to [facility], please contact our office at [phone] between the hours of 8:30 a.m. and 4:30 p.m.  Voicemails left after 4:30 p.m. will not be returned until the following business day.  For prescription refill requests, have your pharmacy contact our office.         Resources For Cancer Patients and their Caregivers ? American Cancer Society: Can assist with transportation, wigs, general needs, runs Look Good Feel Better.        [phone] ? Cancer Care: Provides financial assistance, online support groups, medication/co-pay assistance.  1-800-813-HOPE 940 146 6771) ? [place] Assists [place] cancer patients and their families through emotional , educational and financial support.  (308)727-2283 ? [place] DSS Where to apply for food stamps, Medicaid and utility assistance. 309-702-0083 ? [employer]: Transportation  to medical appointments. 506 134 9583 ? Social Security Administration: May apply for disability if have a Stage IV cancer. 252-348-6723 907 683 3511 ? [employer], Disability and Transit Services: Assists with nutrition, care and transit needs. [facility] Support Programs: '@10RELATIVEDAYS'$ @ > Cancer Support Group  2nd Tuesday of the month 1pm-2pm, Journey Room  > Creative Journey  3rd Tuesday of the month 1130am-1pm, Journey Room  > Look Good Feel Better  1st Wednesday of  the month 10am-12 noon, Journey Room (Call [place] to register [phone])

## 2015-05-27 ENCOUNTER — Encounter (HOSPITAL_COMMUNITY)
Admission: RE | Admit: 2015-05-27 | Discharge: 2015-05-27 | Disposition: A | Discharge status: Home / Self Care | Payer: PPO | Source: Ambulatory Visit | Attending: Oncology | Admitting: Oncology

## 2015-05-27 ENCOUNTER — Other Ambulatory Visit (HOSPITAL_COMMUNITY): Payer: Self-pay | Admitting: Oncology

## 2015-05-27 ENCOUNTER — Ambulatory Visit: Payer: Self-pay

## 2015-05-27 DIAGNOSIS — C649 Malignant neoplasm of unspecified kidney, except renal pelvis: Secondary | ICD-10-CM

## 2015-05-27 MED ORDER — TECHNETIUM TC 99M MEDRONATE IV KIT
25.0000 | PACK | Freq: Once | INTRAVENOUS | Status: AC | PRN
  Administered 2015-05-27: 25 via INTRAVENOUS

## 2015-05-27 MED ORDER — BARIUM SULFATE 2.1 % PO SUSP
ORAL | Status: AC
  Filled 2015-05-27: qty 2

## 2015-05-28 ENCOUNTER — Other Ambulatory Visit (HOSPITAL_COMMUNITY): Payer: Self-pay | Admitting: Oncology

## 2015-05-28 ENCOUNTER — Ambulatory Visit (HOSPITAL_COMMUNITY)
Admission: RE | Admit: 2015-05-28 | Discharge: 2015-05-28 | Disposition: A | Discharge status: Home / Self Care | Payer: PPO | Source: Ambulatory Visit | Attending: Oncology | Admitting: Oncology

## 2015-05-28 DIAGNOSIS — C649 Malignant neoplasm of unspecified kidney, except renal pelvis: Secondary | ICD-10-CM

## 2015-05-28 DIAGNOSIS — C7800 Secondary malignant neoplasm of unspecified lung: Secondary | ICD-10-CM | POA: Diagnosis not present

## 2015-05-28 DIAGNOSIS — C7989 Secondary malignant neoplasm of other specified sites: Secondary | ICD-10-CM | POA: Insufficient documentation

## 2015-05-28 DIAGNOSIS — C7801 Secondary malignant neoplasm of right lung: Secondary | ICD-10-CM | POA: Diagnosis not present

## 2015-05-28 DIAGNOSIS — Z905 Acquired absence of kidney: Secondary | ICD-10-CM | POA: Diagnosis not present

## 2015-05-28 DIAGNOSIS — C779 Secondary and unspecified malignant neoplasm of lymph node, unspecified: Secondary | ICD-10-CM | POA: Diagnosis not present

## 2015-05-28 LAB — POCT I-STAT CREATININE: CREATININE: 2.1 mg/dL — AB (ref 0.44–1.00)

## 2015-05-31 ENCOUNTER — Encounter (HOSPITAL_BASED_OUTPATIENT_CLINIC_OR_DEPARTMENT_OTHER): Payer: PPO

## 2015-05-31 ENCOUNTER — Encounter (HOSPITAL_BASED_OUTPATIENT_CLINIC_OR_DEPARTMENT_OTHER): Payer: PPO | Admitting: Oncology

## 2015-05-31 VITALS — BP 123/59 | HR 68 | Temp 98.1°F | Resp 20 | Wt 177.8 lb

## 2015-05-31 VITALS — BP 109/46 | HR 66 | Temp 98.5°F | Resp 18

## 2015-05-31 DIAGNOSIS — C7800 Secondary malignant neoplasm of unspecified lung: Secondary | ICD-10-CM

## 2015-05-31 DIAGNOSIS — Z5112 Encounter for antineoplastic immunotherapy: Secondary | ICD-10-CM | POA: Diagnosis not present

## 2015-05-31 DIAGNOSIS — C649 Malignant neoplasm of unspecified kidney, except renal pelvis: Secondary | ICD-10-CM

## 2015-05-31 LAB — CBC WITH DIFFERENTIAL/PLATELET
BASOS ABS: 0 10*3/uL (ref 0.0–0.1)
BASOS PCT: 0 %
EOS PCT: 3 %
Eosinophils Absolute: 0.3 10*3/uL (ref 0.0–0.7)
HCT: 32.8 % — ABNORMAL LOW (ref 36.0–46.0)
Hemoglobin: 11.1 g/dL — ABNORMAL LOW (ref 12.0–15.0)
LYMPHS ABS: 1.1 10*3/uL (ref 0.7–4.0)
LYMPHS PCT: 11 %
MCH: 31.9 pg (ref 26.0–34.0)
MCHC: 33.8 g/dL (ref 30.0–36.0)
MCV: 94.3 fL (ref 78.0–100.0)
MONO ABS: 0.5 10*3/uL (ref 0.1–1.0)
MONOS PCT: 5 %
Neutro Abs: 7.9 10*3/uL — ABNORMAL HIGH (ref 1.7–7.7)
Neutrophils Relative %: 81 %
PLATELETS: 227 10*3/uL (ref 150–400)
RBC: 3.48 MIL/uL — ABNORMAL LOW (ref 3.87–5.11)
RDW: 15.4 % (ref 11.5–15.5)
WBC: 9.8 10*3/uL (ref 4.0–10.5)

## 2015-05-31 LAB — COMPREHENSIVE METABOLIC PANEL
ALT: 12 U/L — ABNORMAL LOW (ref 14–54)
ANION GAP: 7 (ref 5–15)
AST: 15 U/L (ref 15–41)
Albumin: 3.9 g/dL (ref 3.5–5.0)
Alkaline Phosphatase: 71 U/L (ref 38–126)
BUN: 31 mg/dL — AB (ref 6–20)
CHLORIDE: 112 mmol/L — AB (ref 101–111)
CO2: 17 mmol/L — ABNORMAL LOW (ref 22–32)
Calcium: 8.4 mg/dL — ABNORMAL LOW (ref 8.9–10.3)
Creatinine, Ser: 2.06 mg/dL — ABNORMAL HIGH (ref 0.44–1.00)
GFR calc Af Amer: 28 mL/min — ABNORMAL LOW (ref >60)
GFR, EST NON AFRICAN AMERICAN: 24 mL/min — AB (ref >60)
Glucose, Bld: 122 mg/dL — ABNORMAL HIGH (ref 65–99)
POTASSIUM: 3.9 mmol/L (ref 3.5–5.1)
Sodium: 136 mmol/L (ref 135–145)
Total Bilirubin: 0.4 mg/dL (ref 0.3–1.2)
Total Protein: 7.7 g/dL (ref 6.5–8.1)

## 2015-05-31 MED ORDER — SODIUM CHLORIDE 0.9 % IJ SOLN: 10.0000 mL | INTRAMUSCULAR | Status: DC | PRN

## 2015-05-31 MED ORDER — SODIUM CHLORIDE 0.9 % IV SOLN
Freq: Once | INTRAVENOUS | Status: AC
  Administered 2015-05-31: 13:00:00 via INTRAVENOUS

## 2015-05-31 MED ORDER — HEPARIN SOD (PORK) LOCK FLUSH 100 UNIT/ML IV SOLN
500.0000 [IU] | Freq: Once | INTRAVENOUS | Status: AC | PRN
  Administered 2015-05-31: 500 [IU]
  Filled 2015-05-31: qty 5

## 2015-05-31 MED ORDER — SODIUM CHLORIDE 0.9 % IV SOLN
240.0000 mg | Freq: Once | INTRAVENOUS | Status: AC
  Administered 2015-05-31: 240 mg via INTRAVENOUS
  Filled 2015-05-31: qty 20

## 2015-05-31 NOTE — Patient Instructions (Signed)
[facility] Discharge Instructions for Patients Receiving Chemotherapy   Beginning January 23rd 2017 lab work for the [facility] will be done in the  Main lab at [facility] on 1st floor. If you have a lab appointment with the [facility] please come in thru the  Main Entrance and check in at the main information desk   Today you received the following chemotherapy agent: Opdivo.     If you develop nausea and vomiting, or diarrhea that is not controlled by your medication, call the clinic.  The clinic phone number is [phone]. Office hours are Monday-Friday 8:30am-5:00pm.  BELOW ARE SYMPTOMS THAT SHOULD BE REPORTED IMMEDIATELY:  *FEVER GREATER THAN 101.0 F  *CHILLS WITH OR WITHOUT FEVER  NAUSEA AND VOMITING THAT IS NOT CONTROLLED WITH YOUR NAUSEA MEDICATION  *UNUSUAL SHORTNESS OF BREATH  *UNUSUAL BRUISING OR BLEEDING  TENDERNESS IN MOUTH AND THROAT WITH OR WITHOUT PRESENCE OF ULCERS  *URINARY PROBLEMS  *BOWEL PROBLEMS  UNUSUAL RASH Items with * indicate a potential emergency and should be followed up as soon as possible. If you have an emergency after office hours please contact your primary care physician or go to the nearest emergency department.  Please call the clinic during office hours if you have any questions or concerns.   You may also contact the Patient Navigator at [phone] should you have any questions or need assistance in obtaining follow up care.      Resources For Cancer Patients and their Caregivers ? American Cancer Society: Can assist with transportation, wigs, general needs, runs Look Good Feel Better.        (223)742-6767 ? Cancer Care: Provides financial assistance, online support groups, medication/co-pay assistance.  1-800-813-HOPE 762-399-7628) ? [place] Assists [place] cancer patients and their families through emotional , educational and financial support.   785-482-4171 ? [place] DSS Where to apply for food stamps, Medicaid and utility assistance. (740)634-5648 ? [employer]: Transportation to medical appointments. 312-114-2204 ? Social Security Administration: May apply for disability if have a Stage IV cancer. [phone] 2240169448 ? [employer], Disability and Transit Services: Assists with nutrition, care and transit needs. (313)551-3606

## 2015-05-31 NOTE — Progress Notes (Signed)
Patient tolerated infusion well.  VSS.   
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
## 2015-05-31 NOTE — Assessment & Plan Note (Addendum)
Stage IV RCC, on single agent Nivolumab, managed by [facility], Dr. Vito Berger.  Recent development of hypercalcemia concerning for progression of disease resulting in imaging and Zometa.  Oncology history is updated.    I personally reviewed and went over radiographic studies with the patient.  The results are noted within this dictation.  The patient will need to sign a release of authorization for [place] to get copies of imaging discs for comparison purposes.  She is encouraged to increase her H2O intake to 64 ounces or more per day.  Today, she is seen smiling and in a good mood. With a very pleasant visit today.  She admits that she is anxious about her imaging results when compared to the ones at [facility]. She is concerned about progression of disease.  Treatment with Nivolumab as planned today.  She will return in 2 weeks for follow-up, review of data, and planned for treatment depending on comparison results of scans.

## 2015-05-31 NOTE — Patient Instructions (Signed)
[name] [name] at [facility] Discharge Instructions  RECOMMENDATIONS MADE BY THE CONSULTANT AND ANY TEST RESULTS WILL BE SENT TO YOUR REFERRING PHYSICIAN.  Your scans were completed.  However, we need discs from [facility] with the picture so we can compare the new scans to your previous scans. As a result, there is not too much to share with you regarding your scans because I do not know whether they're the same, better, or worse. Thank you for signing the release of authorization for [place] to get those discs from [facility]. We will get those discs, and reviewed them with our radiologists. Of course, we will compare  These scans. This will allow [place] to tell you whether your malignancy is the same, better, or worse. We'll perform laboratory work today. We will treat you as planned today with Nivolumab. You will return in 2 weeks for follow-up, review of data, labs, and planned for treatment depending on what we find when we compare your scans.  Thank you for choosing [place] [facility] to provide your oncology and hematology care.  To afford each patient quality time with our provider, please arrive at least 15 minutes before your scheduled appointment time.   Beginning January 23rd 2017 lab work for the [facility] will be done in the  Main lab at [facility] on 1st floor. If you have a lab appointment with the [facility] please come in thru the  Main Entrance and check in at the main information desk  You need to re-schedule your appointment should you arrive 10 or more minutes late.  We strive to give you quality time with our providers, and arriving late affects you and other patients whose appointments are after yours.  Also, if you no show three or more times for appointments you may be dismissed from the clinic at the providers discretion.     Again, thank you for choosing [facility].  Our hope is that these requests will decrease the  amount of time that you wait before being seen by our physicians.       _____________________________________________________________  Should you have questions after your visit to [facility], please contact our office at [phone] between the hours of 8:30 a.m. and 4:30 p.m.  Voicemails left after 4:30 p.m. will not be returned until the following business day.  For prescription refill requests, have your pharmacy contact our office.         Resources For Cancer Patients and their Caregivers ? American Cancer Society: Can assist with transportation, wigs, general needs, runs Look Good Feel Better.        (914) 157-2835 ? Cancer Care: Provides financial assistance, online support groups, medication/co-pay assistance.  1-800-813-HOPE 781-785-2666) ? [place] Assists [place] cancer patients and their families through emotional , educational and financial support.  843-291-2856 ? [place] DSS Where to apply for food stamps, Medicaid and utility assistance. (605) 885-4941 ? [facility]: Transportation to medical appointments. 920-297-7615 ? Social Security Administration: May apply for disability if have a Stage IV cancer. 808-780-8131 772-776-5877 ? [employer], Disability and Transit Services: Assists with nutrition, care and transit needs. [place] Support Programs: '@10RELATIVEDAYS'$ @ > Cancer Support Group  2nd Tuesday of the month 1pm-2pm, Journey Room  > Creative Journey  3rd Tuesday of the month 1130am-1pm, Journey Room  > Look Good Feel Better  1st Wednesday of the month 10am-12 noon,  Journey Room (Call [place] to register [phone])

## 2015-06-04 ENCOUNTER — Other Ambulatory Visit (HOSPITAL_COMMUNITY): Payer: Self-pay | Admitting: Hematology & Oncology

## 2015-06-05 ENCOUNTER — Telehealth (HOSPITAL_COMMUNITY): Payer: Self-pay | Admitting: *Deleted

## 2015-06-05 ENCOUNTER — Other Ambulatory Visit (HOSPITAL_COMMUNITY): Payer: Self-pay | Admitting: Oncology

## 2015-06-05 DIAGNOSIS — C649 Malignant neoplasm of unspecified kidney, except renal pelvis: Secondary | ICD-10-CM

## 2015-06-05 MED ORDER — OXYCODONE HCL 5 MG PO TABS: 5.0000 mg | ORAL_TABLET | Freq: Four times a day (QID) | ORAL | Status: DC | PRN

## 2015-06-05 NOTE — Telephone Encounter (Signed)
Rx printed.  TK

## 2015-06-06 ENCOUNTER — Ambulatory Visit (INDEPENDENT_AMBULATORY_CARE_PROVIDER_SITE_OTHER): Payer: PPO | Admitting: Psychiatry

## 2015-06-06 ENCOUNTER — Encounter (HOSPITAL_COMMUNITY): Payer: Self-pay | Admitting: Psychiatry

## 2015-06-06 DIAGNOSIS — F418 Other specified anxiety disorders: Secondary | ICD-10-CM | POA: Diagnosis not present

## 2015-06-06 NOTE — Progress Notes (Signed)
      THERAPIST PROGRESS NOTE  Session Time:        Thursday 06/06/2015 10:17 AM -  11:04 AM                            Participation Level: Active  Behavioral Response: CasualAlert/ anxious, tearful    Type of Therapy: Individual Therapy  reatment Goals addressed:  1.  Verbalize an increased understanding of the steps to grieving the loss is brought on by her medical condition.      2. Identify feelings associated with medical condition.      3. Learn and implement skills for managing stress.  Interventions: ACT, Supportive  Summary: [name] [name] is a 65 y.o. female who presents with symptoms of depression and anxiety that have been intermittent for several years that have been well controlled until patient was diagnosed with cancer in 2013. Symptoms have worsened in recent months as patient reports constantly thinking about her diagnoses and fears cancer will spread. She has been given a prognosis of 2-4 years to live. Patient reports taking chemotherapy pills and reports extreme fatigue. Patient also experiences depression, anxiety, and crying spells  Patient reports continuing doing well since last session.  She has experienced increased pain issues particularly in her back in her legs. However, she reports discovering exercising her lower legs have helped and she has begun to do this daily. She reports continuing to try to avoid arguing but praying  instead which has had positive impact on her interaction with her family. She remains concerned about her children but expresses less worry and tries to respect their boundaries per her report. Her statements in session reflect increased positive thinking patterns. She reports staying out of bed more and going out for including attending one of her grandchildren's recent pre-K graduation. She tries to attend church regularly and has been attending cancer support group once per month. She also has continued to use journaling.    Suicidal/Homicidal: No    Therapist Response: reviewed symptoms, praised and reinforce patient's use of journaling and spirituality, praise and reinforce patient's increased social involvement, praise and reinforced patient's efforts to identify and replace negative thought patterns, discussed the impact of patient's changed thinking patterns on self and interaction with others   Plan: Patient agrees to return for an appointment in 2-3 weeks, continue journaling and bring to session    Diagnosis: Axis I: Depressive Disorder NOS    Axis II: Deferred    Odie Edmonds, [place] 06/06/2015

## 2015-06-06 NOTE — Patient Instructions (Signed)
Discussed orally 

## 2015-06-07 ENCOUNTER — Ambulatory Visit
Admission: RE | Admit: 2015-06-07 | Discharge: 2015-06-07 | Disposition: A | Discharge status: Home / Self Care | Payer: PPO | Source: Ambulatory Visit

## 2015-06-07 DIAGNOSIS — Z1231 Encounter for screening mammogram for malignant neoplasm of breast: Secondary | ICD-10-CM | POA: Diagnosis not present

## 2015-06-11 ENCOUNTER — Other Ambulatory Visit: Payer: Self-pay | Admitting: Physician Assistant

## 2015-06-11 NOTE — Telephone Encounter (Signed)
Medication refilled per protocol. 

## 2015-06-14 ENCOUNTER — Encounter (HOSPITAL_COMMUNITY): Payer: PPO | Attending: Hematology and Oncology

## 2015-06-14 ENCOUNTER — Encounter (HOSPITAL_BASED_OUTPATIENT_CLINIC_OR_DEPARTMENT_OTHER): Payer: PPO | Admitting: Hematology & Oncology

## 2015-06-14 VITALS — BP 97/56 | HR 63 | Temp 97.5°F | Resp 18 | Wt 178.0 lb

## 2015-06-14 DIAGNOSIS — C649 Malignant neoplasm of unspecified kidney, except renal pelvis: Secondary | ICD-10-CM | POA: Insufficient documentation

## 2015-06-14 DIAGNOSIS — R7989 Other specified abnormal findings of blood chemistry: Secondary | ICD-10-CM

## 2015-06-14 DIAGNOSIS — M549 Dorsalgia, unspecified: Secondary | ICD-10-CM

## 2015-06-14 DIAGNOSIS — C7800 Secondary malignant neoplasm of unspecified lung: Secondary | ICD-10-CM

## 2015-06-14 DIAGNOSIS — F329 Major depressive disorder, single episode, unspecified: Secondary | ICD-10-CM

## 2015-06-14 DIAGNOSIS — R918 Other nonspecific abnormal finding of lung field: Secondary | ICD-10-CM | POA: Insufficient documentation

## 2015-06-14 DIAGNOSIS — Z5112 Encounter for antineoplastic immunotherapy: Secondary | ICD-10-CM

## 2015-06-14 DIAGNOSIS — R5383 Other fatigue: Secondary | ICD-10-CM | POA: Insufficient documentation

## 2015-06-14 DIAGNOSIS — K219 Gastro-esophageal reflux disease without esophagitis: Secondary | ICD-10-CM | POA: Insufficient documentation

## 2015-06-14 DIAGNOSIS — R944 Abnormal results of kidney function studies: Secondary | ICD-10-CM

## 2015-06-14 DIAGNOSIS — F32A Depression, unspecified: Secondary | ICD-10-CM

## 2015-06-14 LAB — CBC WITH DIFFERENTIAL/PLATELET
BASOS ABS: 0 10*3/uL (ref 0.0–0.1)
Basophils Relative: 0 %
EOS ABS: 0.2 10*3/uL (ref 0.0–0.7)
EOS PCT: 3 %
HCT: 33 % — ABNORMAL LOW (ref 36.0–46.0)
HEMOGLOBIN: 10.8 g/dL — AB (ref 12.0–15.0)
Lymphocytes Relative: 17 %
Lymphs Abs: 1.5 10*3/uL (ref 0.7–4.0)
MCH: 31.8 pg (ref 26.0–34.0)
MCHC: 32.7 g/dL (ref 30.0–36.0)
MCV: 97.1 fL (ref 78.0–100.0)
Monocytes Absolute: 0.3 10*3/uL (ref 0.1–1.0)
Monocytes Relative: 3 %
NEUTROS PCT: 77 %
Neutro Abs: 6.7 10*3/uL (ref 1.7–7.7)
PLATELETS: 275 10*3/uL (ref 150–400)
RBC: 3.4 MIL/uL — AB (ref 3.87–5.11)
RDW: 15.7 % — ABNORMAL HIGH (ref 11.5–15.5)
WBC: 8.7 10*3/uL (ref 4.0–10.5)

## 2015-06-14 LAB — COMPREHENSIVE METABOLIC PANEL
ALBUMIN: 4.1 g/dL (ref 3.5–5.0)
ALK PHOS: 77 U/L (ref 38–126)
ALT: 12 U/L — AB (ref 14–54)
AST: 14 U/L — AB (ref 15–41)
Anion gap: 8 (ref 5–15)
BUN: 47 mg/dL — AB (ref 6–20)
CHLORIDE: 111 mmol/L (ref 101–111)
CO2: 16 mmol/L — AB (ref 22–32)
CREATININE: 2.4 mg/dL — AB (ref 0.44–1.00)
Calcium: 8.9 mg/dL (ref 8.9–10.3)
GFR calc Af Amer: 23 mL/min — ABNORMAL LOW (ref >60)
GFR calc non Af Amer: 20 mL/min — ABNORMAL LOW (ref >60)
GLUCOSE: 129 mg/dL — AB (ref 65–99)
Potassium: 4.4 mmol/L (ref 3.5–5.1)
SODIUM: 135 mmol/L (ref 135–145)
Total Bilirubin: 0.7 mg/dL (ref 0.3–1.2)
Total Protein: 7.9 g/dL (ref 6.5–8.1)

## 2015-06-14 LAB — TSH: TSH: 1.382 u[IU]/mL (ref 0.350–4.500)

## 2015-06-14 MED ORDER — SODIUM CHLORIDE 0.9 % IV SOLN
Freq: Once | INTRAVENOUS | Status: AC
  Administered 2015-06-14: 14:00:00 via INTRAVENOUS

## 2015-06-14 MED ORDER — SODIUM CHLORIDE 0.9 % IV SOLN
240.0000 mg | Freq: Once | INTRAVENOUS | Status: AC
  Administered 2015-06-14: 240 mg via INTRAVENOUS
  Filled 2015-06-14: qty 20

## 2015-06-14 MED ORDER — SODIUM CHLORIDE 0.9 % IJ SOLN: 10.0000 mL | INTRAMUSCULAR | Status: DC | PRN

## 2015-06-14 MED ORDER — HEPARIN SOD (PORK) LOCK FLUSH 100 UNIT/ML IV SOLN
500.0000 [IU] | Freq: Once | INTRAVENOUS | Status: AC | PRN
  Administered 2015-06-14: 500 [IU]
  Filled 2015-06-14: qty 5

## 2015-06-14 NOTE — Progress Notes (Signed)
Tolerated infusion w/o adverse reaction.  A&Ox4, in no distress.  VSS.  Discharged via wheelchair in c/o spouse for transport home.

## 2015-06-14 NOTE — Patient Instructions (Addendum)
[place] at [facility] Discharge Instructions  RECOMMENDATIONS MADE BY THE CONSULTANT AND ANY TEST RESULTS WILL BE SENT TO YOUR REFERRING PHYSICIAN.  Exam done and seen today by Dr. Gustavus Bryant reviewed today Labs next week to check kidney function Treatment every 2 weeks Return to see the doctor in 4 weeks Please call the clinic if you have any questions or concerns   Thank you for choosing [place] at [facility] to provide your oncology and hematology care.  To afford each patient quality time with our provider, please arrive at least 15 minutes before your scheduled appointment time.   Beginning January 23rd 2017 lab work for the [facility] will be done in the  Main lab at [facility] on 1st floor. If you have a lab appointment with the [place] please come in thru the  Main Entrance and check in at the main information desk  You need to re-schedule your appointment should you arrive 10 or more minutes late.  We strive to give you quality time with our providers, and arriving late affects you and other patients whose appointments are after yours.  Also, if you no show three or more times for appointments you may be dismissed from the clinic at the providers discretion.     Again, thank you for choosing [facility].  Our hope is that these requests will decrease the amount of time that you wait before being seen by our physicians.       _____________________________________________________________  Should you have questions after your visit to [facility], please contact our office at [phone] between the hours of 8:30 a.m. and 4:30 p.m.  Voicemails left after 4:30 p.m. will not be returned until the following business day.  For prescription refill requests, have your pharmacy contact our office.         Resources For Cancer Patients and their Caregivers ? American Cancer Society: Can assist  with transportation, wigs, general needs, runs Look Good Feel Better.        334 198 6887 ? Cancer Care: Provides financial assistance, online support groups, medication/co-pay assistance.  1-800-813-HOPE 682 235 5471) ? [place] Assists [place] cancer patients and their families through emotional , educational and financial support.  717-482-2221 ? [place] DSS Where to apply for food stamps, Medicaid and utility assistance. 863-476-2169 ? [facility]: Transportation to medical appointments. 817 809 3015 ? Social Security Administration: May apply for disability if have a Stage IV cancer. 973-795-1396 4312870248 ? [employer], Disability and Transit Services: Assists with nutrition, care and transit needs. [place] Support Programs: '@10RELATIVEDAYS'$ @ > Cancer Support Group  2nd Tuesday of the month 1pm-2pm, Journey Room  > Creative Journey  3rd Tuesday of the month 1130am-1pm, Journey Room  > Look Good Feel Better  1st Wednesday of the month 10am-12 noon, Journey Room (Call [place] to register [phone])
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
## 2015-06-14 NOTE — Patient Instructions (Signed)
[facility] Discharge Instructions for Patients Receiving Chemotherapy   Beginning January 23rd 2017 lab work for the Cancer Center will be done in the  Main lab at [place] on 1st floor. If you have a lab appointment with the Cancer Center please come in thru the  Main Entrance and check in at the main information desk   Today you received the following chemotherapy agents:  Opdivo  If you develop nausea and vomiting, or diarrhea that is not controlled by your medication, call the clinic.  The clinic phone number is [phone]. Office hours are Monday-Friday 8:30am-5:00pm.  BELOW ARE SYMPTOMS THAT SHOULD BE REPORTED IMMEDIATELY:  *FEVER GREATER THAN 101.0 F  *CHILLS WITH OR WITHOUT FEVER  NAUSEA AND VOMITING THAT IS NOT CONTROLLED WITH YOUR NAUSEA MEDICATION  *UNUSUAL SHORTNESS OF BREATH  *UNUSUAL BRUISING OR BLEEDING  TENDERNESS IN MOUTH AND THROAT WITH OR WITHOUT PRESENCE OF ULCERS  *URINARY PROBLEMS  *BOWEL PROBLEMS  UNUSUAL RASH Items with * indicate a potential emergency and should be followed up as soon as possible. If you have an emergency after office hours please contact your primary care physician or go to the nearest emergency department.  Please call the clinic during office hours if you have any questions or concerns.   You may also contact the Patient Navigator at [phone] should you have any questions or need assistance in obtaining follow up care.      Resources For Cancer Patients and their Caregivers ? American Cancer Society: Can assist with transportation, wigs, general needs, runs Look Good Feel Better.        1-888-227-6333 ? Cancer Care: Provides financial assistance, online support groups, medication/co-pay assistance.  1-800-813-HOPE (4673) ? [facility] Assists [place] cancer patients and their families through emotional , educational and financial support.   336-427-4357 ? [place] DSS Where to apply for food stamps, Medicaid and utility assistance. 336-342-1394 ? [facility]: Transportation to medical appointments. 336-347-2287 ? Social Security Administration: May apply for disability if have a Stage IV cancer. 336-342-7796 1-800-772-1213 ? [place] Aging, Disability and Transit Services: Assists with nutrition, care and transit needs. 336-349-2343         

## 2015-06-16 ENCOUNTER — Encounter (HOSPITAL_COMMUNITY): Payer: Self-pay | Admitting: Hematology & Oncology

## 2015-06-17 ENCOUNTER — Encounter (HOSPITAL_COMMUNITY): Payer: Self-pay | Admitting: Psychiatry

## 2015-06-17 ENCOUNTER — Ambulatory Visit (INDEPENDENT_AMBULATORY_CARE_PROVIDER_SITE_OTHER): Payer: PPO | Admitting: Psychiatry

## 2015-06-17 VITALS — BP 120/60 | Ht 66.0 in | Wt 180.0 lb

## 2015-06-17 DIAGNOSIS — F418 Other specified anxiety disorders: Secondary | ICD-10-CM | POA: Diagnosis not present

## 2015-06-17 MED ORDER — DIAZEPAM 10 MG PO TABS: 10.0000 mg | ORAL_TABLET | Freq: Four times a day (QID) | ORAL | Status: DC

## 2015-06-17 MED ORDER — ZOLPIDEM TARTRATE 5 MG PO TABS: 5.0000 mg | ORAL_TABLET | Freq: Every evening | ORAL | Status: DC | PRN

## 2015-06-17 MED ORDER — PAROXETINE HCL ER 37.5 MG PO TB24: 37.5000 mg | ORAL_TABLET | Freq: Every day | ORAL | Status: DC
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
## 2015-06-21 ENCOUNTER — Encounter (HOSPITAL_BASED_OUTPATIENT_CLINIC_OR_DEPARTMENT_OTHER): Payer: PPO

## 2015-06-21 ENCOUNTER — Other Ambulatory Visit (HOSPITAL_COMMUNITY): Payer: Self-pay

## 2015-06-21 ENCOUNTER — Ambulatory Visit (HOSPITAL_COMMUNITY): Payer: Self-pay | Admitting: Psychiatry

## 2015-06-21 DIAGNOSIS — C7800 Secondary malignant neoplasm of unspecified lung: Secondary | ICD-10-CM | POA: Diagnosis not present

## 2015-06-21 DIAGNOSIS — C649 Malignant neoplasm of unspecified kidney, except renal pelvis: Secondary | ICD-10-CM | POA: Diagnosis not present

## 2015-06-21 LAB — COMPREHENSIVE METABOLIC PANEL
ALBUMIN: 4 g/dL (ref 3.5–5.0)
ALT: 13 U/L — ABNORMAL LOW (ref 14–54)
ANION GAP: 6 (ref 5–15)
AST: 16 U/L (ref 15–41)
Alkaline Phosphatase: 78 U/L (ref 38–126)
BILIRUBIN TOTAL: 0.4 mg/dL (ref 0.3–1.2)
BUN: 38 mg/dL — ABNORMAL HIGH (ref 6–20)
CO2: 17 mmol/L — ABNORMAL LOW (ref 22–32)
Calcium: 9.1 mg/dL (ref 8.9–10.3)
Chloride: 110 mmol/L (ref 101–111)
Creatinine, Ser: 1.77 mg/dL — ABNORMAL HIGH (ref 0.44–1.00)
GFR calc non Af Amer: 29 mL/min — ABNORMAL LOW (ref >60)
GFR, EST AFRICAN AMERICAN: 34 mL/min — AB (ref >60)
GLUCOSE: 122 mg/dL — AB (ref 65–99)
POTASSIUM: 4.3 mmol/L (ref 3.5–5.1)
SODIUM: 133 mmol/L — AB (ref 135–145)
TOTAL PROTEIN: 8 g/dL (ref 6.5–8.1)

## 2015-06-21 MED ORDER — HEPARIN SOD (PORK) LOCK FLUSH 100 UNIT/ML IV SOLN
500.0000 [IU] | Freq: Once | INTRAVENOUS | Status: AC
  Administered 2015-06-21: 500 [IU] via INTRAVENOUS
  Filled 2015-06-21: qty 5

## 2015-06-21 MED ORDER — SODIUM CHLORIDE 0.9% FLUSH
20.0000 mL | INTRAVENOUS | Status: DC | PRN
  Administered 2015-06-21: 20 mL via INTRAVENOUS
  Filled 2015-06-21: qty 20

## 2015-06-21 NOTE — Progress Notes (Signed)
[name] [name] presented for Portacath access and flush. Proper placement of portacath confirmed by CXR. Portacath located left chest wall accessed with  H 20 needle. Good blood return present.  Specimen drawn for labs.   Portacath flushed with 7m NS and 500U/564mHeparin and needle removed intact. Procedure without incident. Patient tolerated procedure well.

## 2015-06-21 NOTE — Patient Instructions (Signed)
[place] at [facility] Discharge Instructions  RECOMMENDATIONS MADE BY THE CONSULTANT AND ANY TEST RESULTS WILL BE SENT TO YOUR REFERRING PHYSICIAN.  Port flush with labs.    Thank you for choosing [place] at [facility] to provide your oncology and hematology care.  To afford each patient quality time with our provider, please arrive at least 15 minutes before your scheduled appointment time.   Beginning January 23rd 2017 lab work for the [facility] will be done in the  Main lab at [facility] on 1st floor. If you have a lab appointment with the [facility] please come in thru the  Main Entrance and check in at the main information desk  You need to re-schedule your appointment should you arrive 10 or more minutes late.  We strive to give you quality time with our providers, and arriving late affects you and other patients whose appointments are after yours.  Also, if you no show three or more times for appointments you may be dismissed from the clinic at the providers discretion.     Again, thank you for choosing [facility].  Our hope is that these requests will decrease the amount of time that you wait before being seen by our physicians.       _____________________________________________________________  Should you have questions after your visit to [facility], please contact our office at [phone] between the hours of 8:30 a.m. and 4:30 p.m.  Voicemails left after 4:30 p.m. will not be returned until the following business day.  For prescription refill requests, have your pharmacy contact our office.         Resources For Cancer Patients and their Caregivers ? American Cancer Society: Can assist with transportation, wigs, general needs, runs Look Good Feel Better.        2510730624 ? Cancer Care: Provides financial assistance, online support groups, medication/co-pay assistance.   1-800-813-HOPE (952)250-6074) ? [place] Assists [place] cancer patients and their families through emotional , educational and financial support.  (609)759-7967 ? [place] DSS Where to apply for food stamps, Medicaid and utility assistance. (310)348-4425 ? RCATS: Transportation to medical appointments. (262) 474-1073 ? Social Security Administration: May apply for disability if have a Stage IV cancer. (848) 276-4019 610-502-9462 ? [employer], Disability and Transit Services: Assists with nutrition, care and transit needs. [place] Support Programs: '@10RELATIVEDAYS'$ @ > Cancer Support Group  2nd Tuesday of the month 1pm-2pm, Journey Room  > Creative Journey  3rd Tuesday of the month 1130am-1pm, Journey Room  > Look Good Feel Better  1st Wednesday of the month 10am-12 noon, Journey Room (Call [place] to register [phone])

## 2015-06-24 DIAGNOSIS — Z85528 Personal history of other malignant neoplasm of kidney: Secondary | ICD-10-CM | POA: Diagnosis not present

## 2015-06-24 DIAGNOSIS — R590 Localized enlarged lymph nodes: Secondary | ICD-10-CM | POA: Diagnosis not present

## 2015-06-24 DIAGNOSIS — Z6829 Body mass index (BMI) 29.0-29.9, adult: Secondary | ICD-10-CM | POA: Diagnosis not present

## 2015-06-24 DIAGNOSIS — C7800 Secondary malignant neoplasm of unspecified lung: Secondary | ICD-10-CM | POA: Diagnosis not present

## 2015-06-24 DIAGNOSIS — R918 Other nonspecific abnormal finding of lung field: Secondary | ICD-10-CM | POA: Diagnosis not present

## 2015-06-24 DIAGNOSIS — Z79899 Other long term (current) drug therapy: Secondary | ICD-10-CM | POA: Diagnosis not present

## 2015-06-24 DIAGNOSIS — R599 Enlarged lymph nodes, unspecified: Secondary | ICD-10-CM | POA: Diagnosis not present

## 2015-06-24 DIAGNOSIS — C649 Malignant neoplasm of unspecified kidney, except renal pelvis: Secondary | ICD-10-CM | POA: Diagnosis not present

## 2015-06-25 ENCOUNTER — Encounter (HOSPITAL_COMMUNITY): Payer: Self-pay | Admitting: Psychiatry

## 2015-06-25 ENCOUNTER — Ambulatory Visit (INDEPENDENT_AMBULATORY_CARE_PROVIDER_SITE_OTHER): Payer: PPO | Admitting: Psychiatry

## 2015-06-25 DIAGNOSIS — C649 Malignant neoplasm of unspecified kidney, except renal pelvis: Secondary | ICD-10-CM | POA: Diagnosis not present

## 2015-06-25 DIAGNOSIS — F418 Other specified anxiety disorders: Secondary | ICD-10-CM | POA: Diagnosis not present

## 2015-06-25 NOTE — Patient Instructions (Signed)
Discussed orally 

## 2015-06-25 NOTE — Progress Notes (Signed)
      THERAPIST PROGRESS NOTE  Session Time:        Tuesday 06/25/2015 10:05 AM -  10:58 AM                           Participation Level: Active  Behavioral Response: CasualAlert/ anxious, tearful    Type of Therapy: Individual Therapy  reatment Goals addressed:  1.  Verbalize an increased understanding of the steps to grieving the loss is brought on by her medical condition.      2. Identify feelings associated with medical condition.      3. Learn and implement skills for managing stress.  Interventions: ACT, Supportive  Summary: [name] [name] is a 65 y.o. female who presents with symptoms of depression and anxiety that have been intermittent for several years that have been well controlled until patient was diagnosed with cancer in 2013. Symptoms have worsened in recent months as patient reports constantly thinking about her diagnoses and fears cancer will spread. She has been given a prognosis of 2-4 years to live. Patient reports taking chemotherapy pills and reports extreme fatigue. Patient also experiences depression, anxiety, and crying spells  Patient states being mainly good since last session with the exception of a few crying spells when she thinks about her death. She states no longer worrying about what will happen to her children when she dies but rather what will happen to her. She expresses a belief in God and going to have been but is frustrated because she does not know all the details about what happens after death. Although having a few crying spells, she reports being able to go on and enjoy her day. She has maintained involvement in activities and reports enjoying a recent family reunion. She continues to attend church and journal. Suicidal/Homicidal: No    Therapist Response: reviewed symptoms, praised and reinforce patient's use of journaling and spirituality, facilitated patient's expression of feelings and thoughts about death and afterlife, discussed living with  uncertainty  Plan: Patient agrees to return for an appointment in 3-4 weeks, continue journaling and bring to session    Diagnosis: Axis I: Depressive Disorder NOS    Axis II: Deferred    Gabriela Irigoyen, LCSW 06/25/2015

## 2015-06-26 ENCOUNTER — Telehealth (HOSPITAL_COMMUNITY): Payer: Self-pay

## 2015-06-26 ENCOUNTER — Other Ambulatory Visit (HOSPITAL_COMMUNITY): Payer: Self-pay | Admitting: *Deleted

## 2015-06-26 ENCOUNTER — Encounter (HOSPITAL_COMMUNITY): Payer: Self-pay

## 2015-06-26 ENCOUNTER — Encounter (HOSPITAL_COMMUNITY)
Admission: RE | Admit: 2015-06-26 | Discharge: 2015-06-26 | Disposition: A | Discharge status: Home / Self Care | Payer: PPO | Source: Ambulatory Visit | Attending: Hematology & Oncology | Admitting: Hematology & Oncology

## 2015-06-26 VITALS — BP 95/40 | HR 70 | Temp 97.7°F | Resp 18

## 2015-06-26 DIAGNOSIS — C649 Malignant neoplasm of unspecified kidney, except renal pelvis: Secondary | ICD-10-CM | POA: Diagnosis not present

## 2015-06-26 MED ORDER — HEPARIN SOD (PORK) LOCK FLUSH 100 UNIT/ML IV SOLN
500.0000 [IU] | Freq: Once | INTRAVENOUS | Status: AC
  Administered 2015-06-26: 500 [IU] via INTRAVENOUS
  Filled 2015-06-26: qty 5

## 2015-06-26 MED ORDER — SODIUM CHLORIDE 0.9 % IV SOLN
INTRAVENOUS | Status: DC
  Administered 2015-06-26: 14:00:00 via INTRAVENOUS

## 2015-06-26 NOTE — Telephone Encounter (Signed)
Called patient to tell her that she needs fluids today per [facility] MD and Dr. Whitney Muse is aware. She will go to short stay at 1 pm today for this

## 2015-06-28 ENCOUNTER — Encounter (HOSPITAL_COMMUNITY): Payer: Self-pay

## 2015-06-28 ENCOUNTER — Encounter (HOSPITAL_BASED_OUTPATIENT_CLINIC_OR_DEPARTMENT_OTHER): Payer: PPO

## 2015-06-28 VITALS — BP 113/58 | HR 75 | Temp 98.3°F | Resp 18 | Wt 178.4 lb

## 2015-06-28 DIAGNOSIS — C649 Malignant neoplasm of unspecified kidney, except renal pelvis: Secondary | ICD-10-CM | POA: Diagnosis not present

## 2015-06-28 DIAGNOSIS — C7800 Secondary malignant neoplasm of unspecified lung: Secondary | ICD-10-CM | POA: Diagnosis not present

## 2015-06-28 DIAGNOSIS — Z5112 Encounter for antineoplastic immunotherapy: Secondary | ICD-10-CM

## 2015-06-28 LAB — CBC WITH DIFFERENTIAL/PLATELET
BASOS ABS: 0 10*3/uL (ref 0.0–0.1)
BASOS PCT: 0 %
EOS ABS: 0.2 10*3/uL (ref 0.0–0.7)
Eosinophils Relative: 2 %
HEMATOCRIT: 33.4 % — AB (ref 36.0–46.0)
HEMOGLOBIN: 11 g/dL — AB (ref 12.0–15.0)
Lymphocytes Relative: 12 %
Lymphs Abs: 1 10*3/uL (ref 0.7–4.0)
MCH: 32.2 pg (ref 26.0–34.0)
MCHC: 32.9 g/dL (ref 30.0–36.0)
MCV: 97.7 fL (ref 78.0–100.0)
MONO ABS: 0.4 10*3/uL (ref 0.1–1.0)
MONOS PCT: 5 %
NEUTROS ABS: 6.5 10*3/uL (ref 1.7–7.7)
NEUTROS PCT: 81 %
Platelets: 204 10*3/uL (ref 150–400)
RBC: 3.42 MIL/uL — ABNORMAL LOW (ref 3.87–5.11)
RDW: 16.1 % — AB (ref 11.5–15.5)
WBC: 8.1 10*3/uL (ref 4.0–10.5)

## 2015-06-28 LAB — COMPREHENSIVE METABOLIC PANEL
ALBUMIN: 4 g/dL (ref 3.5–5.0)
ALT: 14 U/L (ref 14–54)
ANION GAP: 6 (ref 5–15)
AST: 14 U/L — AB (ref 15–41)
Alkaline Phosphatase: 77 U/L (ref 38–126)
BUN: 39 mg/dL — AB (ref 6–20)
CO2: 18 mmol/L — AB (ref 22–32)
Calcium: 8.8 mg/dL — ABNORMAL LOW (ref 8.9–10.3)
Chloride: 114 mmol/L — ABNORMAL HIGH (ref 101–111)
Creatinine, Ser: 1.64 mg/dL — ABNORMAL HIGH (ref 0.44–1.00)
GFR calc Af Amer: 37 mL/min — ABNORMAL LOW (ref >60)
GFR calc non Af Amer: 32 mL/min — ABNORMAL LOW (ref >60)
GLUCOSE: 118 mg/dL — AB (ref 65–99)
POTASSIUM: 4.3 mmol/L (ref 3.5–5.1)
SODIUM: 138 mmol/L (ref 135–145)
Total Bilirubin: 0.5 mg/dL (ref 0.3–1.2)
Total Protein: 7.6 g/dL (ref 6.5–8.1)

## 2015-06-28 MED ORDER — SODIUM CHLORIDE 0.9 % IJ SOLN: 10.0000 mL | INTRAMUSCULAR | Status: DC | PRN

## 2015-06-28 MED ORDER — SODIUM CHLORIDE 0.9 % IV SOLN
240.0000 mg | Freq: Once | INTRAVENOUS | Status: AC
  Administered 2015-06-28: 240 mg via INTRAVENOUS
  Filled 2015-06-28: qty 20

## 2015-06-28 MED ORDER — HEPARIN SOD (PORK) LOCK FLUSH 100 UNIT/ML IV SOLN
500.0000 [IU] | Freq: Once | INTRAVENOUS | Status: AC | PRN
  Administered 2015-06-28: 500 [IU]

## 2015-06-28 MED ORDER — SODIUM CHLORIDE 0.9 % IV SOLN
Freq: Once | INTRAVENOUS | Status: AC
  Administered 2015-06-28: 13:00:00 via INTRAVENOUS

## 2015-06-28 NOTE — Patient Instructions (Signed)
[place] at [facility] Discharge Instructions  RECOMMENDATIONS MADE BY THE CONSULTANT AND ANY TEST RESULTS WILL BE SENT TO YOUR REFERRING PHYSICIAN.    opdivo today opdivo in 2 weeks Return as scheduled Please call the clinic if you have any questions or concerns   Thank you for choosing [place] at [facility] to provide your oncology and hematology care.  To afford each patient quality time with our provider, please arrive at least 15 minutes before your scheduled appointment time.   Beginning January 23rd 2017 lab work for the [facility] will be done in the  Main lab at [facility] on 1st floor. If you have a lab appointment with the [facility] please come in thru the  Main Entrance and check in at the main information desk  You need to re-schedule your appointment should you arrive 10 or more minutes late.  We strive to give you quality time with our providers, and arriving late affects you and other patients whose appointments are after yours.  Also, if you no show three or more times for appointments you may be dismissed from the clinic at the providers discretion.     Again, thank you for choosing [facility].  Our hope is that these requests will decrease the amount of time that you wait before being seen by our physicians.       _____________________________________________________________  Should you have questions after your visit to [facility], please contact our office at [phone] between the hours of 8:30 a.m. and 4:30 p.m.  Voicemails left after 4:30 p.m. will not be returned until the following business day.  For prescription refill requests, have your pharmacy contact our office.         Resources For Cancer Patients and their Caregivers ? American Cancer Society: Can assist with transportation, wigs, general needs, runs Look Good Feel Better.        208-084-5878 ? Cancer  Care: Provides financial assistance, online support groups, medication/co-pay assistance.  1-800-813-HOPE 256-353-9931) ? [place] Assists [place] cancer patients and their families through emotional , educational and financial support.  (858)607-7402 ? [place] DSS Where to apply for food stamps, Medicaid and utility assistance. (873)468-1874 ? RCATS: Transportation to medical appointments. 337-236-9368 ? Social Security Administration: May apply for disability if have a Stage IV cancer. (343)355-3334 313-407-1067 ? [employer], Disability and Transit Services: Assists with nutrition, care and transit needs. [place] Support Programs: '@10RELATIVEDAYS'$ @ > Cancer Support Group  2nd Tuesday of the month 1pm-2pm, Journey Room  > Creative Journey  3rd Tuesday of the month 1130am-1pm, Journey Room  > Look Good Feel Better  1st Wednesday of the month 10am-12 noon, Journey Room (Call [place] to register [phone])

## 2015-06-28 NOTE — Progress Notes (Signed)
[name] [name] Tolerated chemotherapy well today Discharged ambulatory

## 2015-07-09 ENCOUNTER — Ambulatory Visit (HOSPITAL_COMMUNITY): Payer: Self-pay | Admitting: Psychiatry

## 2015-07-09 ENCOUNTER — Other Ambulatory Visit: Payer: Self-pay | Admitting: Physician Assistant

## 2015-07-10 NOTE — Telephone Encounter (Signed)
Medication refilled per protocol. 

## 2015-07-12 ENCOUNTER — Encounter (HOSPITAL_BASED_OUTPATIENT_CLINIC_OR_DEPARTMENT_OTHER): Payer: PPO | Admitting: Oncology

## 2015-07-12 ENCOUNTER — Encounter (HOSPITAL_BASED_OUTPATIENT_CLINIC_OR_DEPARTMENT_OTHER): Payer: PPO

## 2015-07-12 ENCOUNTER — Inpatient Hospital Stay (HOSPITAL_COMMUNITY): Payer: Self-pay

## 2015-07-12 VITALS — BP 95/47 | HR 58 | Temp 97.6°F | Resp 16

## 2015-07-12 VITALS — BP 121/56 | HR 73 | Temp 97.6°F | Resp 16 | Wt 179.0 lb

## 2015-07-12 DIAGNOSIS — Z5112 Encounter for antineoplastic immunotherapy: Secondary | ICD-10-CM | POA: Diagnosis not present

## 2015-07-12 DIAGNOSIS — C649 Malignant neoplasm of unspecified kidney, except renal pelvis: Secondary | ICD-10-CM

## 2015-07-12 LAB — CBC WITH DIFFERENTIAL/PLATELET
Basophils Absolute: 0 10*3/uL (ref 0.0–0.1)
Basophils Relative: 0 %
EOS ABS: 0.3 10*3/uL (ref 0.0–0.7)
Eosinophils Relative: 3 %
HEMATOCRIT: 33.4 % — AB (ref 36.0–46.0)
HEMOGLOBIN: 10.9 g/dL — AB (ref 12.0–15.0)
LYMPHS ABS: 1.2 10*3/uL (ref 0.7–4.0)
LYMPHS PCT: 13 %
MCH: 32.2 pg (ref 26.0–34.0)
MCHC: 32.6 g/dL (ref 30.0–36.0)
MCV: 98.8 fL (ref 78.0–100.0)
MONOS PCT: 5 %
Monocytes Absolute: 0.4 10*3/uL (ref 0.1–1.0)
NEUTROS ABS: 7.1 10*3/uL (ref 1.7–7.7)
NEUTROS PCT: 79 %
Platelets: 225 10*3/uL (ref 150–400)
RBC: 3.38 MIL/uL — AB (ref 3.87–5.11)
RDW: 15.5 % (ref 11.5–15.5)
WBC: 9 10*3/uL (ref 4.0–10.5)

## 2015-07-12 LAB — COMPREHENSIVE METABOLIC PANEL
ALBUMIN: 3.8 g/dL (ref 3.5–5.0)
ALK PHOS: 63 U/L (ref 38–126)
ALT: 11 U/L — AB (ref 14–54)
AST: 14 U/L — ABNORMAL LOW (ref 15–41)
Anion gap: 5 (ref 5–15)
BILIRUBIN TOTAL: 0.7 mg/dL (ref 0.3–1.2)
BUN: 56 mg/dL — ABNORMAL HIGH (ref 6–20)
CALCIUM: 8.3 mg/dL — AB (ref 8.9–10.3)
CO2: 17 mmol/L — AB (ref 22–32)
Chloride: 112 mmol/L — ABNORMAL HIGH (ref 101–111)
Creatinine, Ser: 3.18 mg/dL — ABNORMAL HIGH (ref 0.44–1.00)
GFR calc non Af Amer: 14 mL/min — ABNORMAL LOW (ref >60)
GFR, EST AFRICAN AMERICAN: 17 mL/min — AB (ref >60)
GLUCOSE: 119 mg/dL — AB (ref 65–99)
Potassium: 4.9 mmol/L (ref 3.5–5.1)
SODIUM: 134 mmol/L — AB (ref 135–145)
TOTAL PROTEIN: 7.1 g/dL (ref 6.5–8.1)

## 2015-07-12 MED ORDER — HEPARIN SOD (PORK) LOCK FLUSH 100 UNIT/ML IV SOLN
500.0000 [IU] | Freq: Once | INTRAVENOUS | Status: AC | PRN
Start: 2015-07-12 — End: 2015-07-12
  Administered 2015-07-12: 500 [IU]

## 2015-07-12 MED ORDER — SODIUM CHLORIDE 0.9 % IJ SOLN: 10.0000 mL | INTRAMUSCULAR | Status: DC | PRN

## 2015-07-12 MED ORDER — SODIUM CHLORIDE 0.9 % IV SOLN
Freq: Once | INTRAVENOUS | Status: AC
  Administered 2015-07-12: 14:00:00 via INTRAVENOUS

## 2015-07-12 MED ORDER — SODIUM CHLORIDE 0.9 % IV SOLN
240.0000 mg | Freq: Once | INTRAVENOUS | Status: AC
  Administered 2015-07-12: 240 mg via INTRAVENOUS
  Filled 2015-07-12: qty 20
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
## 2015-07-12 NOTE — Patient Instructions (Signed)
[facility] Discharge Instructions for Patients Receiving Chemotherapy   Beginning January 23rd 2017 lab work for the [facility] will be done in the  Main lab at [facility] on 1st floor. If you have a lab appointment with the [facility] please come in thru the  Main Entrance and check in at the main information desk   Today you received the following chemotherapy agent: Opdivo.     If you develop nausea and vomiting, or diarrhea that is not controlled by your medication, call the clinic.  The clinic phone number is [phone]. Office hours are Monday-Friday 8:30am-5:00pm.  BELOW ARE SYMPTOMS THAT SHOULD BE REPORTED IMMEDIATELY:  *FEVER GREATER THAN 101.0 F  *CHILLS WITH OR WITHOUT FEVER  NAUSEA AND VOMITING THAT IS NOT CONTROLLED WITH YOUR NAUSEA MEDICATION  *UNUSUAL SHORTNESS OF BREATH  *UNUSUAL BRUISING OR BLEEDING  TENDERNESS IN MOUTH AND THROAT WITH OR WITHOUT PRESENCE OF ULCERS  *URINARY PROBLEMS  *BOWEL PROBLEMS  UNUSUAL RASH Items with * indicate a potential emergency and should be followed up as soon as possible. If you have an emergency after office hours please contact your primary care physician or go to the nearest emergency department.  Please call the clinic during office hours if you have any questions or concerns.   You may also contact the Patient Navigator at [phone] should you have any questions or need assistance in obtaining follow up care.      Resources For Cancer Patients and their Caregivers ? American Cancer Society: Can assist with transportation, wigs, general needs, runs Look Good Feel Better.        480-693-2139 ? Cancer Care: Provides financial assistance, online support groups, medication/co-pay assistance.  1-800-813-HOPE 4357916857) ? [facility] Assists [place] cancer patients and their families through emotional , educational and financial support.   856-500-4714 ? [place] DSS Where to apply for food stamps, Medicaid and utility assistance. 403-094-9115 ? RCATS: Transportation to medical appointments. (249)798-6426 ? Social Security Administration: May apply for disability if have a Stage IV cancer. 8105983949 (337)342-3617 ? [employer], Disability and Transit Services: Assists with nutrition, care and transit needs. [phone]

## 2015-07-12 NOTE — Patient Instructions (Signed)
[place] at [facility] Discharge Instructions  RECOMMENDATIONS MADE BY THE CONSULTANT AND ANY TEST RESULTS WILL BE SENT TO YOUR REFERRING PHYSICIAN.  You were seen by [name], PA today. Nivolumab every 2 weeks. Pre-treatment labs before your next treatment. Return to clinic in 4-6 weeks for follow-up appointment. Call clinic with any questions or concerns.   Thank you for choosing [place] at [facility] to provide your oncology and hematology care.  To afford each patient quality time with our provider, please arrive at least 15 minutes before your scheduled appointment time.   Beginning January 23rd 2017 lab work for the [facility] will be done in the  Main lab at [facility] on 1st floor. If you have a lab appointment with the [facility] please come in thru the  Main Entrance and check in at the main information desk  You need to re-schedule your appointment should you arrive 10 or more minutes late.  We strive to give you quality time with our providers, and arriving late affects you and other patients whose appointments are after yours.  Also, if you no show three or more times for appointments you may be dismissed from the clinic at the providers discretion.     Again, thank you for choosing [facility].  Our hope is that these requests will decrease the amount of time that you wait before being seen by our physicians.       _____________________________________________________________  Should you have questions after your visit to [facility], please contact our office at [phone] between the hours of 8:30 a.m. and 4:30 p.m.  Voicemails left after 4:30 p.m. will not be returned until the following business day.  For prescription refill requests, have your pharmacy contact our office.         Resources For Cancer Patients and their Caregivers ? American Cancer Society: Can assist with  transportation, wigs, general needs, runs Look Good Feel Better.        ([phone] [phone] ? Cancer Care: Provides financial assistance, online support groups, medication/co-pay assistance.  1-800-813-HOPE 213-070-8373) ? [place] Assists [place] cancer patients and their families through emotional , educational and financial support.  [phone] ? [place] DSS Where to apply for food stamps, Medicaid and utility assistance. 845-056-3645 ? RCATS: Transportation to medical appointments. 713-277-0704 ? Social Security Administration: May apply for disability if have a Stage IV cancer. 8027610862 [phone])267-8798 ? [employer], Disability and Transit Services: Assists with nutrition, care and transit needs. [place] Support Programs: '@10RELATIVEDAYS'$ @ > Cancer Support Group  2nd Tuesday of the month 1pm-2pm, Journey Room  > Creative Journey  3rd Tuesday of the month 1130am-1pm, Journey Room  > Look Good Feel Better  1st Wednesday of the month 10am-12 noon, Journey Room (Call [place] to register [phone])

## 2015-07-12 NOTE — Assessment & Plan Note (Signed)
Stage IV RCC, on single agent Nivolumab, managed by [facility], Dr. Vito Berger.  Recent development of hypercalcemia, treated with Zometa with excellent response.  Oncology history is updated.    Labs today: CBC diff, CMET. I personally reviewed and went over laboratory results with the patient.  The results are noted within this dictation.  Renal function is similar to 6/2.  Hydration is encouraged.  Additional fluids will be given today.  Clinically, she looks good/stable.  We will continue to monitor TSH every 4 weeks.  She saw Dr. Vito Berger on 06/24/2015.  His plan follows below: Plan: I reviewed the interval history and imaging with mixed response/stable disease. Continue nivolumab as does appear to be impacting on disease based on today's imaging c/w 04/2015.  I again discussed potential management options in the setting of POD including but not limited to cabozantinib (CABOMETYX). I explained that it is indicated in patients with advanced clear cell RCC after prior treatment with a TKI. I reviewed the significant improvement in PFS compared to everolimus (7.4 mo vs 3.8 mo, HR=0.58 (95% CI: 0.45-0.74); p<0.0001), improvement in overall survival (21.4 mo vs 16.5 mo, HR=0.66 (95% CI: 0.53-0.83), p=0.0003) and objective response rate (17% vs 3%, p<0.0001). I discussed potential side effects including but not limited to diarrhea, fatigue, nausea, decreased appetite, PPES, HTN, vomiting, weight loss, and constipation. I discussed the potential need for dose reduction (60%) and dose withholds (70%) and the discontinuation rate of 10%. I explained the need to without food - no food 2 hours prior and 1 hour after. I also again reviewed other potential therapies including everolimus, axitinib and lenvatinib and everolimus. Issues with tolerability with pazopanib in past that will need to be taken into consideration with any switch to other therapy.   We had a lovely interaction today.  We had a few good laughs.  She  notes some left knee discomfort.  If it persists, I would not hesitate to perform plain films, but at this time, will hold off.  Treatment with Nivolumab as planned today.  Return in 4-6 weeks for follow-up with pre-treatment labs.

## 2015-07-12 NOTE — Progress Notes (Signed)
Patient tolerated infusion well.  VSS.   

## 2015-07-15 ENCOUNTER — Encounter (HOSPITAL_COMMUNITY): Payer: PPO | Attending: Hematology and Oncology

## 2015-07-15 ENCOUNTER — Other Ambulatory Visit (HOSPITAL_COMMUNITY): Payer: Self-pay | Admitting: *Deleted

## 2015-07-15 ENCOUNTER — Other Ambulatory Visit (HOSPITAL_COMMUNITY): Payer: Self-pay | Admitting: Hematology & Oncology

## 2015-07-15 ENCOUNTER — Telehealth (HOSPITAL_COMMUNITY): Payer: Self-pay | Admitting: *Deleted

## 2015-07-15 ENCOUNTER — Other Ambulatory Visit (HOSPITAL_COMMUNITY): Payer: Self-pay | Admitting: Oncology

## 2015-07-15 DIAGNOSIS — K219 Gastro-esophageal reflux disease without esophagitis: Secondary | ICD-10-CM | POA: Diagnosis not present

## 2015-07-15 DIAGNOSIS — R5383 Other fatigue: Secondary | ICD-10-CM | POA: Insufficient documentation

## 2015-07-15 DIAGNOSIS — C649 Malignant neoplasm of unspecified kidney, except renal pelvis: Secondary | ICD-10-CM

## 2015-07-15 DIAGNOSIS — R918 Other nonspecific abnormal finding of lung field: Secondary | ICD-10-CM | POA: Diagnosis not present

## 2015-07-15 DIAGNOSIS — E875 Hyperkalemia: Secondary | ICD-10-CM | POA: Insufficient documentation

## 2015-07-15 LAB — BASIC METABOLIC PANEL
Anion gap: 6 (ref 5–15)
BUN: 47 mg/dL — AB (ref 6–20)
CHLORIDE: 114 mmol/L — AB (ref 101–111)
CO2: 15 mmol/L — ABNORMAL LOW (ref 22–32)
Calcium: 9.2 mg/dL (ref 8.9–10.3)
Creatinine, Ser: 2.51 mg/dL — ABNORMAL HIGH (ref 0.44–1.00)
GFR calc Af Amer: 22 mL/min — ABNORMAL LOW (ref >60)
GFR calc non Af Amer: 19 mL/min — ABNORMAL LOW (ref >60)
GLUCOSE: 132 mg/dL — AB (ref 65–99)
POTASSIUM: 5.7 mmol/L — AB (ref 3.5–5.1)
Sodium: 135 mmol/L (ref 135–145)

## 2015-07-15 MED ORDER — SODIUM POLYSTYRENE SULFONATE 15 GM/60ML PO SUSP: ORAL | Status: DC

## 2015-07-15 NOTE — Telephone Encounter (Signed)
Pt notified that she needed to come in for a creatinine recheck.

## 2015-07-16 ENCOUNTER — Other Ambulatory Visit (HOSPITAL_COMMUNITY): Payer: Self-pay | Admitting: *Deleted

## 2015-07-16 DIAGNOSIS — C649 Malignant neoplasm of unspecified kidney, except renal pelvis: Secondary | ICD-10-CM

## 2015-07-17 ENCOUNTER — Other Ambulatory Visit (HOSPITAL_COMMUNITY): Payer: Self-pay

## 2015-07-17 ENCOUNTER — Encounter (HOSPITAL_COMMUNITY): Payer: Self-pay | Admitting: Psychiatry

## 2015-07-17 ENCOUNTER — Ambulatory Visit (INDEPENDENT_AMBULATORY_CARE_PROVIDER_SITE_OTHER): Payer: PPO | Admitting: Psychiatry

## 2015-07-17 ENCOUNTER — Telehealth: Payer: Self-pay | Admitting: Physician Assistant

## 2015-07-17 DIAGNOSIS — F418 Other specified anxiety disorders: Secondary | ICD-10-CM | POA: Diagnosis not present

## 2015-07-17 NOTE — Telephone Encounter (Signed)
Received fax from [place] back Auth# [id number] Treating Provider Dr. Ancil Linsey # of Visits 1 Start Date 05/28/15 End Date 11/24/15 CPT 74177 and 71260 DX C64.9

## 2015-07-17 NOTE — Progress Notes (Signed)
      THERAPIST PROGRESS NOTE  Session Time:        Wednesday 07/17/2015 1:05 PM - 1:58 PM                    Participation Level: Active  Behavioral Response: CasualAlert/ anxious, tearful    Type of Therapy: Individual Therapy  Treatment Goals:    1.  Verbalize an increased understanding of the steps to grieving the loss is brought on by her medical condition.      2. Identify feelings associated with medical condition.      3. Learn and implement skills for managing stress.    Treatment Goals addressed: 3   Interventions: ACT, Supportive  Summary: MALLARY KREGER is a 65 y.o. female who presents with symptoms of depression and anxiety that have been intermittent for several years that have been well controlled until patient was diagnosed with cancer in 2013. Symptoms have worsened in recent months as patient reports constantly thinking about her diagnoses and fears cancer will spread. She has been given a prognosis of 2-4 years to live. Patient reports taking chemotherapy pills and reports extreme fatigue. Patient also experiences depression, anxiety, and crying spells  Patient reports increased depressed mood and poor motivation since last session. She has difficulty identifying a specific trigger but does report starting to feel more down around the time she and husband began to have more arguments. She reports little to no involvement in activities and reports increased irritability. She expresses frustration with husband and other family members as they do not spend as much time with them as she wishes.    Suicidal/Homicidal: No    Therapist Response: reviewed symptoms, facilitated patient's expression of feelings, facilitated expression of feelings, discussed being assertive versus aggressive in communication with husband and other family members, encouraged patient to resume journaling   Plan: Patient agrees to return for an appointment in 3-4 weeks, continue journaling and bring  to session    Diagnosis: Axis I: Depressive Disorder NOS    Axis II: Deferred    BYNUM,PEGGY, LCSW 07/17/2015

## 2015-07-17 NOTE — Patient Instructions (Signed)
Discussed orally 

## 2015-07-18 DIAGNOSIS — R262 Difficulty in walking, not elsewhere classified: Secondary | ICD-10-CM | POA: Diagnosis not present

## 2015-07-18 DIAGNOSIS — M170 Bilateral primary osteoarthritis of knee: Secondary | ICD-10-CM | POA: Diagnosis not present

## 2015-07-18 DIAGNOSIS — M25562 Pain in left knee: Secondary | ICD-10-CM | POA: Diagnosis not present

## 2015-07-19 ENCOUNTER — Encounter (HOSPITAL_COMMUNITY): Payer: PPO

## 2015-07-19 DIAGNOSIS — C649 Malignant neoplasm of unspecified kidney, except renal pelvis: Secondary | ICD-10-CM

## 2015-07-19 LAB — COMPREHENSIVE METABOLIC PANEL
ALK PHOS: 78 U/L (ref 38–126)
ALT: 15 U/L (ref 14–54)
ANION GAP: 5 (ref 5–15)
AST: 17 U/L (ref 15–41)
Albumin: 4 g/dL (ref 3.5–5.0)
BILIRUBIN TOTAL: 0.6 mg/dL (ref 0.3–1.2)
BUN: 27 mg/dL — ABNORMAL HIGH (ref 6–20)
CALCIUM: 8.7 mg/dL — AB (ref 8.9–10.3)
CO2: 22 mmol/L (ref 22–32)
Chloride: 113 mmol/L — ABNORMAL HIGH (ref 101–111)
Creatinine, Ser: 1.91 mg/dL — ABNORMAL HIGH (ref 0.44–1.00)
GFR, EST AFRICAN AMERICAN: 31 mL/min — AB (ref >60)
GFR, EST NON AFRICAN AMERICAN: 26 mL/min — AB (ref >60)
GLUCOSE: 100 mg/dL — AB (ref 65–99)
POTASSIUM: 4.1 mmol/L (ref 3.5–5.1)
Sodium: 140 mmol/L (ref 135–145)
TOTAL PROTEIN: 8 g/dL (ref 6.5–8.1)

## 2015-07-26 ENCOUNTER — Encounter (HOSPITAL_COMMUNITY): Payer: Self-pay

## 2015-07-26 ENCOUNTER — Encounter (HOSPITAL_BASED_OUTPATIENT_CLINIC_OR_DEPARTMENT_OTHER): Payer: PPO

## 2015-07-26 VITALS — BP 104/48 | HR 68 | Temp 98.1°F | Resp 18 | Wt 174.6 lb

## 2015-07-26 DIAGNOSIS — Z5112 Encounter for antineoplastic immunotherapy: Secondary | ICD-10-CM

## 2015-07-26 DIAGNOSIS — C649 Malignant neoplasm of unspecified kidney, except renal pelvis: Secondary | ICD-10-CM | POA: Diagnosis not present

## 2015-07-26 LAB — CBC WITH DIFFERENTIAL/PLATELET
BASOS ABS: 0 10*3/uL (ref 0.0–0.1)
BASOS PCT: 0 %
EOS ABS: 0.3 10*3/uL (ref 0.0–0.7)
Eosinophils Relative: 3 %
HCT: 33.5 % — ABNORMAL LOW (ref 36.0–46.0)
HEMOGLOBIN: 11.1 g/dL — AB (ref 12.0–15.0)
LYMPHS ABS: 1 10*3/uL (ref 0.7–4.0)
Lymphocytes Relative: 12 %
MCH: 32.3 pg (ref 26.0–34.0)
MCHC: 33.1 g/dL (ref 30.0–36.0)
MCV: 97.4 fL (ref 78.0–100.0)
Monocytes Absolute: 0.5 10*3/uL (ref 0.1–1.0)
Monocytes Relative: 6 %
NEUTROS PCT: 79 %
Neutro Abs: 6.4 10*3/uL (ref 1.7–7.7)
Platelets: 195 10*3/uL (ref 150–400)
RBC: 3.44 MIL/uL — AB (ref 3.87–5.11)
RDW: 15 % (ref 11.5–15.5)
WBC: 8.2 10*3/uL (ref 4.0–10.5)

## 2015-07-26 LAB — COMPREHENSIVE METABOLIC PANEL
ALBUMIN: 4.1 g/dL (ref 3.5–5.0)
ALK PHOS: 67 U/L (ref 38–126)
ALT: 12 U/L — AB (ref 14–54)
AST: 16 U/L (ref 15–41)
Anion gap: 8 (ref 5–15)
BUN: 41 mg/dL — ABNORMAL HIGH (ref 6–20)
CALCIUM: 9.4 mg/dL (ref 8.9–10.3)
CHLORIDE: 114 mmol/L — AB (ref 101–111)
CO2: 14 mmol/L — AB (ref 22–32)
CREATININE: 2.29 mg/dL — AB (ref 0.44–1.00)
GFR calc Af Amer: 25 mL/min — ABNORMAL LOW (ref >60)
GFR calc non Af Amer: 21 mL/min — ABNORMAL LOW (ref >60)
GLUCOSE: 147 mg/dL — AB (ref 65–99)
Potassium: 4.6 mmol/L (ref 3.5–5.1)
SODIUM: 136 mmol/L (ref 135–145)
Total Bilirubin: 0.5 mg/dL (ref 0.3–1.2)
Total Protein: 7.8 g/dL (ref 6.5–8.1)

## 2015-07-26 LAB — TSH: TSH: 1.933 u[IU]/mL (ref 0.350–4.500)

## 2015-07-26 MED ORDER — SODIUM CHLORIDE 0.9 % IV SOLN
Freq: Once | INTRAVENOUS | Status: AC
  Administered 2015-07-26: 11:00:00 via INTRAVENOUS

## 2015-07-26 MED ORDER — SODIUM CHLORIDE 0.9 % IJ SOLN
10.0000 mL | INTRAMUSCULAR | Status: DC | PRN
  Administered 2015-07-26: 10 mL
  Filled 2015-07-26: qty 10

## 2015-07-26 MED ORDER — SODIUM CHLORIDE 0.9 % IV SOLN
240.0000 mg | Freq: Once | INTRAVENOUS | Status: AC
  Administered 2015-07-26: 240 mg via INTRAVENOUS
  Filled 2015-07-26: qty 20

## 2015-07-26 MED ORDER — HEPARIN SOD (PORK) LOCK FLUSH 100 UNIT/ML IV SOLN
500.0000 [IU] | Freq: Once | INTRAVENOUS | Status: AC | PRN
  Administered 2015-07-26: 500 [IU]

## 2015-07-26 NOTE — Progress Notes (Signed)
[place] reviewed with Dr. Whitney Muse BUN 41 Creat 2.29. Chemo approved. Pt to increase PO fluid intake per MD. Pt verbalized understanding of this information

## 2015-07-26 NOTE — Patient Instructions (Signed)
[place] [facility] Discharge Instructions for Patients Receiving Chemotherapy   Beginning January 23rd 2017 lab work for the Cancer Center will be done in the  Main lab at [facility] on 1st floor. If you have a lab appointment with the Cancer Center please come in thru the  Main Entrance and check in at the main information desk   Today you received the following chemotherapy agents Opdivo  To help prevent nausea and vomiting after your treatment, we encourage you to take your nausea medication   If you develop nausea and vomiting, or diarrhea that is not controlled by your medication, call the clinic.  The clinic phone number is [phone]. Office hours are Monday-Friday 8:30am-5:00pm.  BELOW ARE SYMPTOMS THAT SHOULD BE REPORTED IMMEDIATELY:  *FEVER GREATER THAN 101.0 F  *CHILLS WITH OR WITHOUT FEVER  NAUSEA AND VOMITING THAT IS NOT CONTROLLED WITH YOUR NAUSEA MEDICATION  *UNUSUAL SHORTNESS OF BREATH  *UNUSUAL BRUISING OR BLEEDING  TENDERNESS IN MOUTH AND THROAT WITH OR WITHOUT PRESENCE OF ULCERS  *URINARY PROBLEMS  *BOWEL PROBLEMS  UNUSUAL RASH Items with * indicate a potential emergency and should be followed up as soon as possible. If you have an emergency after office hours please contact your primary care physician or go to the nearest emergency department.  Please call the clinic during office hours if you have any questions or concerns.   You may also contact the Patient Navigator at [phone] should you have any questions or need assistance in obtaining follow up care.      Resources For Cancer Patients and their Caregivers ? American Cancer Society: Can assist with transportation, wigs, general needs, runs Look Good Feel Better.        1-888-227-6333 ? Cancer Care: Provides financial assistance, online support groups, medication/co-pay assistance.  1-800-813-HOPE (4673) ? [facility] Assists [place] cancer  patients and their families through emotional , educational and financial support.  336-427-4357 ? [place] DSS Where to apply for food stamps, Medicaid and utility assistance. 336-342-1394 ? RCATS: Transportation to medical appointments. 336-347-2287 ? Social Security Administration: May apply for disability if have a Stage IV cancer. 336-342-7796 1-800-772-1213 ? [place] Aging, Disability and Transit Services: Assists with nutrition, care and transit needs. [phone]          

## 2015-07-26 NOTE — Progress Notes (Signed)
0950 Pt ambulatory to tx room. Port accessed and blood drawn for labs ordered

## 2015-07-29 ENCOUNTER — Other Ambulatory Visit: Payer: Self-pay | Admitting: Physician Assistant

## 2015-07-29 ENCOUNTER — Encounter: Payer: Self-pay | Admitting: Family Medicine

## 2015-07-29 DIAGNOSIS — M1712 Unilateral primary osteoarthritis, left knee: Secondary | ICD-10-CM | POA: Diagnosis not present

## 2015-07-29 DIAGNOSIS — R262 Difficulty in walking, not elsewhere classified: Secondary | ICD-10-CM | POA: Diagnosis not present

## 2015-07-29 DIAGNOSIS — M25562 Pain in left knee: Secondary | ICD-10-CM | POA: Diagnosis not present

## 2015-07-29 NOTE — Telephone Encounter (Signed)
Medication refill for one time only.  Patient needs to be seen.  Letter sent for patient to call and schedule 

## 2015-08-07 ENCOUNTER — Encounter (HOSPITAL_COMMUNITY): Payer: Self-pay | Admitting: Psychiatry

## 2015-08-07 ENCOUNTER — Ambulatory Visit (INDEPENDENT_AMBULATORY_CARE_PROVIDER_SITE_OTHER): Payer: PPO | Admitting: Psychiatry

## 2015-08-07 DIAGNOSIS — F32A Depression, unspecified: Secondary | ICD-10-CM

## 2015-08-07 DIAGNOSIS — F329 Major depressive disorder, single episode, unspecified: Secondary | ICD-10-CM | POA: Diagnosis not present

## 2015-08-07 NOTE — Progress Notes (Signed)
      THERAPIST PROGRESS NOTE  Session Time:        Wednesday 7 26/2017, 2:15 PM - 3:00 PM                    Participation Level: Active  Behavioral Response: CasualAlert/ improved mood, less anxious    Type of Therapy: Individual Therapy  Treatment Goals:    1.  Verbalize an increased understanding of the steps to grieving the loss is brought on by her medical condition.      2. Identify feelings associated with medical condition.      3. Learn and implement skills for managing stress.    Treatment Goals addressed: 3   Interventions: ACT, Supportive  Summary: [name] [name] is a 65 y.o. female who presents with symptoms of depression and anxiety that have been intermittent for several years that have been well controlled until patient was diagnosed with cancer in 2013. Symptoms have worsened in recent months as patient reports constantly thinking about her diagnoses and fears cancer will spread. She has been given a prognosis of 2-4 years to live. Patient reports taking chemotherapy pills and reports extreme fatigue. Patient also experiences depression, anxiety, and crying spells  Patient reports improved mood and decreased irritability since last session. She reports decreased arguments with husband. She reports being less aggressive in communication and also resuming journaling. She reports stresss related to her young grandchildren being home for the summer as they are noisy. She is hopeful she and her husband will be able to go away for a few days.She hopes to go to the beach. She has had increased nausea and anticipates her medication will be changed when she goes to next appointment in [facility]. Patient is concerned about the appointment but does not appear overwhelmed.  She also reports stress regarding her relationship with her daughter due to conflict.   Suicidal/Homicidal: No    Therapist Response: reviewed symptoms, facilitated patient's expression of feelings, praised and  reinforced patient's use of journaling,  discussed effects of journaling, assisted patient identify relaxation activities including meditation and nature sounds, assisted patient identify ways to have time for self and take a break from being at home  Plan: Patient agrees to return for an appointment in 3-4 weeks, continue journaling and bring to session    Diagnosis: Axis I: Depressive Disorder NOS    Axis II: Deferred    Timesha Cervantez, LCSW 08/07/2015

## 2015-08-08 DIAGNOSIS — M25562 Pain in left knee: Secondary | ICD-10-CM | POA: Diagnosis not present

## 2015-08-08 DIAGNOSIS — M1712 Unilateral primary osteoarthritis, left knee: Secondary | ICD-10-CM | POA: Diagnosis not present

## 2015-08-09 ENCOUNTER — Encounter (HOSPITAL_BASED_OUTPATIENT_CLINIC_OR_DEPARTMENT_OTHER): Payer: PPO

## 2015-08-09 ENCOUNTER — Encounter (HOSPITAL_COMMUNITY): Payer: PPO | Attending: Oncology | Admitting: Oncology

## 2015-08-09 VITALS — BP 112/54 | HR 88 | Temp 97.6°F | Resp 18 | Wt 174.1 lb

## 2015-08-09 DIAGNOSIS — C779 Secondary and unspecified malignant neoplasm of lymph node, unspecified: Secondary | ICD-10-CM

## 2015-08-09 DIAGNOSIS — Z5112 Encounter for antineoplastic immunotherapy: Secondary | ICD-10-CM | POA: Diagnosis not present

## 2015-08-09 DIAGNOSIS — C649 Malignant neoplasm of unspecified kidney, except renal pelvis: Secondary | ICD-10-CM

## 2015-08-09 DIAGNOSIS — C7800 Secondary malignant neoplasm of unspecified lung: Secondary | ICD-10-CM | POA: Diagnosis not present

## 2015-08-09 LAB — COMPREHENSIVE METABOLIC PANEL
ALK PHOS: 80 U/L (ref 38–126)
ALT: 13 U/L — ABNORMAL LOW (ref 14–54)
ANION GAP: 6 (ref 5–15)
AST: 17 U/L (ref 15–41)
Albumin: 4.2 g/dL (ref 3.5–5.0)
BUN: 34 mg/dL — ABNORMAL HIGH (ref 6–20)
CALCIUM: 9.1 mg/dL (ref 8.9–10.3)
CO2: 15 mmol/L — AB (ref 22–32)
Chloride: 116 mmol/L — ABNORMAL HIGH (ref 101–111)
Creatinine, Ser: 2.07 mg/dL — ABNORMAL HIGH (ref 0.44–1.00)
GFR calc non Af Amer: 24 mL/min — ABNORMAL LOW (ref >60)
GFR, EST AFRICAN AMERICAN: 28 mL/min — AB (ref >60)
Glucose, Bld: 139 mg/dL — ABNORMAL HIGH (ref 65–99)
Potassium: 3.5 mmol/L (ref 3.5–5.1)
SODIUM: 137 mmol/L (ref 135–145)
TOTAL PROTEIN: 7.9 g/dL (ref 6.5–8.1)
Total Bilirubin: 0.5 mg/dL (ref 0.3–1.2)

## 2015-08-09 LAB — CBC WITH DIFFERENTIAL/PLATELET
BASOS ABS: 0 10*3/uL (ref 0.0–0.1)
BASOS PCT: 0 %
Eosinophils Absolute: 0.2 10*3/uL (ref 0.0–0.7)
Eosinophils Relative: 3 %
HEMATOCRIT: 35.7 % — AB (ref 36.0–46.0)
HEMOGLOBIN: 12 g/dL (ref 12.0–15.0)
Lymphocytes Relative: 20 %
Lymphs Abs: 1.5 10*3/uL (ref 0.7–4.0)
MCH: 32.3 pg (ref 26.0–34.0)
MCHC: 33.6 g/dL (ref 30.0–36.0)
MCV: 96.2 fL (ref 78.0–100.0)
MONOS PCT: 6 %
Monocytes Absolute: 0.5 10*3/uL (ref 0.1–1.0)
NEUTROS ABS: 5.6 10*3/uL (ref 1.7–7.7)
NEUTROS PCT: 71 %
Platelets: 232 10*3/uL (ref 150–400)
RBC: 3.71 MIL/uL — AB (ref 3.87–5.11)
RDW: 15.2 % (ref 11.5–15.5)
WBC: 7.9 10*3/uL (ref 4.0–10.5)

## 2015-08-09 MED ORDER — HEPARIN SOD (PORK) LOCK FLUSH 100 UNIT/ML IV SOLN
500.0000 [IU] | Freq: Once | INTRAVENOUS | Status: AC | PRN
  Administered 2015-08-09: 500 [IU]

## 2015-08-09 MED ORDER — SODIUM CHLORIDE 0.9 % IV SOLN
Freq: Once | INTRAVENOUS | Status: AC
  Administered 2015-08-09: 12:00:00 via INTRAVENOUS

## 2015-08-09 MED ORDER — SODIUM CHLORIDE 0.9 % IJ SOLN: 10.0000 mL | INTRAMUSCULAR | Status: DC | PRN

## 2015-08-09 MED ORDER — SODIUM CHLORIDE 0.9 % IV SOLN
240.0000 mg | Freq: Once | INTRAVENOUS | Status: AC
  Administered 2015-08-09: 240 mg via INTRAVENOUS
  Filled 2015-08-09: qty 20

## 2015-08-09 NOTE — Patient Instructions (Signed)
[place] at [facility] Discharge Instructions  RECOMMENDATIONS MADE BY THE CONSULTANT AND ANY TEST RESULTS WILL BE SENT TO YOUR REFERRING PHYSICIAN.  You were seen by Gershon Mussel today. Samples of Nexium given.  Pre-treatment labs and treatment evety 2 weeks Return to Clinic for follow up in 4 weeks [facility] appt as scheduled.   Thank you for choosing [place] at [facility] to provide your oncology and hematology care.  To afford each patient quality time with our provider, please arrive at least 15 minutes before your scheduled appointment time.   Beginning January 23rd 2017 lab work for the [facility] will be done in the  Main lab at [facility] on 1st floor. If you have a lab appointment with the [facility] please come in thru the  Main Entrance and check in at the main information desk  You need to re-schedule your appointment should you arrive 10 or more minutes late.  We strive to give you quality time with our providers, and arriving late affects you and other patients whose appointments are after yours.  Also, if you no show three or more times for appointments you may be dismissed from the clinic at the providers discretion.     Again, thank you for choosing [facility].  Our hope is that these requests will decrease the amount of time that you wait before being seen by our physicians.       _____________________________________________________________  Should you have questions after your visit to [facility], please contact our office at [phone] between the hours of 8:30 a.m. and 4:30 p.m.  Voicemails left after 4:30 p.m. will not be returned until the following business day.  For prescription refill requests, have your pharmacy contact our office.         Resources For Cancer Patients and their Caregivers ? American Cancer Society: Can assist with transportation, wigs, general needs, runs Look  Good Feel Better.        5614484902 ? Cancer Care: Provides financial assistance, online support groups, medication/co-pay assistance.  1-800-813-HOPE 407-766-5522) ? [place] Assists [place] cancer patients and their families through emotional , educational and financial support.  859-542-3788 ? [place] DSS Where to apply for food stamps, Medicaid and utility assistance. 386-403-6795 ? RCATS: Transportation to medical appointments. 920-795-6904 ? Social Security Administration: May apply for disability if have a Stage IV cancer. (215)720-2108 971-793-0035 ? [employer], Disability and Transit Services: Assists with nutrition, care and transit needs. [place] Support Programs: '@10RELATIVEDAYS'$ @ > Cancer Support Group  2nd Tuesday of the month 1pm-2pm, Journey Room  > Creative Journey  3rd Tuesday of the month 1130am-1pm, Journey Room  > Look Good Feel Better  1st Wednesday of the month 10am-12 noon, Journey Room (Call [place] to register [phone])
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
## 2015-08-11 NOTE — Assessment & Plan Note (Addendum)
Stage IV RCC, on single agent Nivolumab, managed by [facility], Dr. Vito Berger.  Recent development of hypercalcemia, treated with Zometa with excellent response.  Oncology history is updated.    Oncology Flowsheet 05/24/2015  zolendronic acid (ZOMETA) IV 3 mg    Labs today: CBC diff, CMET. I personally reviewed and went over laboratory results with the patient.  The results are noted within this dictation.  Renal function is improved.  TSH is planned for next cycle of therapy.  She notes issues with GERD and therefore, I have provided her a few samples of Nexium.  She knows to call us iff effective and I will prescribe the medication for her.  She saw Dr. Vito Berger on 06/24/2015.  His plan follows below: Plan: I reviewed the interval history and imaging with mixed response/stable disease. Continue nivolumab as does appear to be impacting on disease based on today's imaging c/w 04/2015.  I again discussed potential management options in the setting of POD including but not limited to cabozantinib (CABOMETYX). I explained that it is indicated in patients with advanced clear cell RCC after prior treatment with a TKI. I reviewed the significant improvement in PFS compared to everolimus (7.4 mo vs 3.8 mo, HR=0.58 (95% CI: 0.45-0.74); p<0.0001), improvement in overall survival (21.4 mo vs 16.5 mo, HR=0.66 (95% CI: 0.53-0.83), p=0.0003) and objective response rate (17% vs 3%, p<0.0001). I discussed potential side effects including but not limited to diarrhea, fatigue, nausea, decreased appetite, PPES, HTN, vomiting, weight loss, and constipation. I discussed the potential need for dose reduction (60%) and dose withholds (70%) and the discontinuation rate of 10%. I explained the need to without food - no food 2 hours prior and 1 hour after. I also again reviewed other potential therapies including everolimus, axitinib and lenvatinib and everolimus. Issues with tolerability with pazopanib in past that will need to be  taken into consideration with any switch to other therapy.   We had a lovely interaction today.  We had a few good laughs.  He husband provided some stories about his time in the service.  Treatment with Nivolumab as planned today.  Return in 4 weeks for follow-up with pre-treatment labs.  She has follow-up at [facility] in 1 month.

## 2015-08-14 DIAGNOSIS — M1712 Unilateral primary osteoarthritis, left knee: Secondary | ICD-10-CM | POA: Diagnosis not present

## 2015-08-14 DIAGNOSIS — M25562 Pain in left knee: Secondary | ICD-10-CM | POA: Diagnosis not present

## 2015-08-21 DIAGNOSIS — M25562 Pain in left knee: Secondary | ICD-10-CM | POA: Diagnosis not present

## 2015-08-21 DIAGNOSIS — M1712 Unilateral primary osteoarthritis, left knee: Secondary | ICD-10-CM | POA: Diagnosis not present

## 2015-08-22 ENCOUNTER — Other Ambulatory Visit (HOSPITAL_COMMUNITY): Payer: Self-pay | Admitting: Oncology

## 2015-08-23 ENCOUNTER — Encounter (HOSPITAL_COMMUNITY): Payer: PPO | Attending: Hematology and Oncology

## 2015-08-23 VITALS — BP 136/62 | HR 76 | Temp 97.8°F | Resp 18 | Wt 175.8 lb

## 2015-08-23 DIAGNOSIS — C649 Malignant neoplasm of unspecified kidney, except renal pelvis: Secondary | ICD-10-CM | POA: Diagnosis not present

## 2015-08-23 DIAGNOSIS — C7800 Secondary malignant neoplasm of unspecified lung: Secondary | ICD-10-CM

## 2015-08-23 DIAGNOSIS — R5383 Other fatigue: Secondary | ICD-10-CM | POA: Diagnosis not present

## 2015-08-23 DIAGNOSIS — Z5112 Encounter for antineoplastic immunotherapy: Secondary | ICD-10-CM

## 2015-08-23 DIAGNOSIS — K219 Gastro-esophageal reflux disease without esophagitis: Secondary | ICD-10-CM | POA: Diagnosis not present

## 2015-08-23 DIAGNOSIS — C779 Secondary and unspecified malignant neoplasm of lymph node, unspecified: Secondary | ICD-10-CM

## 2015-08-23 DIAGNOSIS — R918 Other nonspecific abnormal finding of lung field: Secondary | ICD-10-CM | POA: Insufficient documentation

## 2015-08-23 LAB — CBC WITH DIFFERENTIAL/PLATELET
Basophils Absolute: 0 10*3/uL (ref 0.0–0.1)
Basophils Relative: 0 %
Eosinophils Absolute: 0.3 10*3/uL (ref 0.0–0.7)
Eosinophils Relative: 4 %
HEMATOCRIT: 36.4 % (ref 36.0–46.0)
HEMOGLOBIN: 11.8 g/dL — AB (ref 12.0–15.0)
LYMPHS ABS: 1.6 10*3/uL (ref 0.7–4.0)
LYMPHS PCT: 21 %
MCH: 31.5 pg (ref 26.0–34.0)
MCHC: 32.4 g/dL (ref 30.0–36.0)
MCV: 97.1 fL (ref 78.0–100.0)
MONO ABS: 0.4 10*3/uL (ref 0.1–1.0)
MONOS PCT: 6 %
NEUTROS ABS: 5.3 10*3/uL (ref 1.7–7.7)
NEUTROS PCT: 69 %
Platelets: 228 10*3/uL (ref 150–400)
RBC: 3.75 MIL/uL — ABNORMAL LOW (ref 3.87–5.11)
RDW: 15 % (ref 11.5–15.5)
WBC: 7.7 10*3/uL (ref 4.0–10.5)

## 2015-08-23 LAB — COMPREHENSIVE METABOLIC PANEL
ALBUMIN: 3.9 g/dL (ref 3.5–5.0)
ALK PHOS: 69 U/L (ref 38–126)
ALT: 12 U/L — ABNORMAL LOW (ref 14–54)
ANION GAP: 8 (ref 5–15)
AST: 17 U/L (ref 15–41)
BILIRUBIN TOTAL: 0.4 mg/dL (ref 0.3–1.2)
BUN: 26 mg/dL — AB (ref 6–20)
CALCIUM: 9 mg/dL (ref 8.9–10.3)
CO2: 15 mmol/L — ABNORMAL LOW (ref 22–32)
Chloride: 115 mmol/L — ABNORMAL HIGH (ref 101–111)
Creatinine, Ser: 1.85 mg/dL — ABNORMAL HIGH (ref 0.44–1.00)
GFR calc Af Amer: 32 mL/min — ABNORMAL LOW (ref >60)
GFR, EST NON AFRICAN AMERICAN: 27 mL/min — AB (ref >60)
GLUCOSE: 141 mg/dL — AB (ref 65–99)
POTASSIUM: 3.3 mmol/L — AB (ref 3.5–5.1)
Sodium: 138 mmol/L (ref 135–145)
TOTAL PROTEIN: 7.6 g/dL (ref 6.5–8.1)

## 2015-08-23 LAB — TSH: TSH: 1.521 u[IU]/mL (ref 0.350–4.500)

## 2015-08-23 MED ORDER — SODIUM CHLORIDE 0.9 % IV SOLN
Freq: Once | INTRAVENOUS | Status: AC
  Administered 2015-08-23: 12:00:00 via INTRAVENOUS

## 2015-08-23 MED ORDER — SODIUM CHLORIDE 0.9 % IV SOLN
240.0000 mg | Freq: Once | INTRAVENOUS | Status: AC
  Administered 2015-08-23: 240 mg via INTRAVENOUS
  Filled 2015-08-23: qty 20

## 2015-08-23 MED ORDER — HEPARIN SOD (PORK) LOCK FLUSH 100 UNIT/ML IV SOLN
500.0000 [IU] | Freq: Once | INTRAVENOUS | Status: AC | PRN
  Administered 2015-08-23: 500 [IU]
  Filled 2015-08-23: qty 5

## 2015-08-23 MED ORDER — SODIUM CHLORIDE 0.9 % IJ SOLN
10.0000 mL | INTRAMUSCULAR | Status: DC | PRN
  Administered 2015-08-23: 10 mL
  Filled 2015-08-23: qty 10

## 2015-08-23 NOTE — Progress Notes (Signed)
[name] [name] tolerated chemo tx well without complaints. Potassium 3.3 today. [name] PA notified of labs and ordered for pt to resume Potassium 1 daily and could take cinammon per pt request. Pt verbalized understanding. Pt discharged via wheelchair in satisfactory condition with husband

## 2015-08-23 NOTE — Patient Instructions (Signed)
[facility] Discharge Instructions for Patients Receiving Chemotherapy   Beginning January 23rd 2017 lab work for the [facility] will be done in the  Main lab at [facility] on 1st floor. If you have a lab appointment with the [facility] please come in thru the  Main Entrance and check in at the main information desk   Today you received the following chemotherapy agents Opdivo. Follow-up as scheduled. Call clinic for any questions or concerns  To help prevent nausea and vomiting after your treatment, we encourage you to take your nausea medication   If you develop nausea and vomiting, or diarrhea that is not controlled by your medication, call the clinic.  The clinic phone number is [phone]. Office hours are Monday-Friday 8:30am-5:00pm.  BELOW ARE SYMPTOMS THAT SHOULD BE REPORTED IMMEDIATELY:  *FEVER GREATER THAN 101.0 F  *CHILLS WITH OR WITHOUT FEVER  NAUSEA AND VOMITING THAT IS NOT CONTROLLED WITH YOUR NAUSEA MEDICATION  *UNUSUAL SHORTNESS OF BREATH  *UNUSUAL BRUISING OR BLEEDING  TENDERNESS IN MOUTH AND THROAT WITH OR WITHOUT PRESENCE OF ULCERS  *URINARY PROBLEMS  *BOWEL PROBLEMS  UNUSUAL RASH Items with * indicate a potential emergency and should be followed up as soon as possible. If you have an emergency after office hours please contact your primary care physician or go to the nearest emergency department.  Please call the clinic during office hours if you have any questions or concerns.   You may also contact the Patient Navigator at [phone] should you have any questions or need assistance in obtaining follow up care.      Resources For Cancer Patients and their Caregivers ? American Cancer Society: Can assist with transportation, wigs, general needs, runs Look Good Feel Better.        316-501-4829 ? Cancer Care: Provides financial assistance, online support groups, medication/co-pay assistance.  1-800-813-HOPE  339-858-3888) ? [place] Assists [place] cancer patients and their families through emotional , educational and financial support.  203-629-3344 ? [place] DSS Where to apply for food stamps, Medicaid and utility assistance. (629)770-2527 ? RCATS: Transportation to medical appointments. 801-817-0924 ? Social Security Administration: May apply for disability if have a Stage IV cancer. [phone] [phone] ? [employer], Disability and Transit Services: Assists with nutrition, care and transit needs. [phone]

## 2015-08-26 DIAGNOSIS — C649 Malignant neoplasm of unspecified kidney, except renal pelvis: Secondary | ICD-10-CM | POA: Diagnosis not present

## 2015-08-26 DIAGNOSIS — D7389 Other diseases of spleen: Secondary | ICD-10-CM | POA: Diagnosis not present

## 2015-08-26 DIAGNOSIS — C7800 Secondary malignant neoplasm of unspecified lung: Secondary | ICD-10-CM | POA: Diagnosis not present

## 2015-08-26 DIAGNOSIS — Z79899 Other long term (current) drug therapy: Secondary | ICD-10-CM | POA: Diagnosis not present

## 2015-08-26 DIAGNOSIS — C641 Malignant neoplasm of right kidney, except renal pelvis: Secondary | ICD-10-CM | POA: Diagnosis not present

## 2015-08-26 DIAGNOSIS — R590 Localized enlarged lymph nodes: Secondary | ICD-10-CM | POA: Diagnosis not present

## 2015-08-26 DIAGNOSIS — Z6828 Body mass index (BMI) 28.0-28.9, adult: Secondary | ICD-10-CM | POA: Diagnosis not present

## 2015-08-27 DIAGNOSIS — M25562 Pain in left knee: Secondary | ICD-10-CM | POA: Diagnosis not present

## 2015-08-27 DIAGNOSIS — M1712 Unilateral primary osteoarthritis, left knee: Secondary | ICD-10-CM | POA: Diagnosis not present

## 2015-08-28 ENCOUNTER — Ambulatory Visit (INDEPENDENT_AMBULATORY_CARE_PROVIDER_SITE_OTHER): Payer: PPO | Admitting: Psychiatry

## 2015-08-28 ENCOUNTER — Encounter (HOSPITAL_COMMUNITY): Payer: Self-pay | Admitting: Psychiatry

## 2015-08-28 DIAGNOSIS — F32A Depression, unspecified: Secondary | ICD-10-CM

## 2015-08-28 DIAGNOSIS — F329 Major depressive disorder, single episode, unspecified: Secondary | ICD-10-CM | POA: Diagnosis not present

## 2015-08-28 NOTE — Progress Notes (Signed)
      THERAPIST PROGRESS NOTE  Session Time:        Wednesday 08/28/2015 2:14 PM -  3:01 PM                             Participation Level: Active  Behavioral Response: CasualAlert/ improved mood, less anxious    Type of Therapy: Individual Therapy  Treatment Goals:    1.  Verbalize an increased understanding of the steps to grieving the loss is brought on by her medical condition.      2. Identify feelings associated with medical condition.      3. Learn and implement skills for managing stress.    Treatment Goals addressed: 1,2.3   Interventions: ACT, Supportive  Summary: [name] [name] is a 65 y.o. female who presents with symptoms of depression and anxiety that have been intermittent for several years that have been well controlled until patient was diagnosed with cancer in 2013. Symptoms have worsened in recent months as patient reports constantly thinking about her diagnoses and fears cancer will spread. She has been given a prognosis of 2-4 years to live. Patient reports taking chemotherapy pills and reports extreme fatigue. Patient also experiences depression, anxiety, and crying spells  Patient reports being less depressed since last session. She is pleased about recent visit to doctor in [facility] as she was informed the nodules in her lung have shrunk per patient's report.  However, she continues to ruminate over statement from a doctor about a year ago that she will die from cancer. She expresses frustration about continued changes in her physical functioning and body She also reports continued worry about her children and her home. She is more pleasant today and smiles frequently during session. She reports increased social involvement and reports going out to dinner a couple of times since last session. He also reports improved interaction with her husband since both of them recently gave each other an apology.  Suicidal/Homicidal: No    Therapist Response: reviewed  symptoms, facilitated patient's expression of feelings regarding changed functioning, assisted patient identify ways to control worry using thought stopping, discussed mindfulness techniques to use to help patient remained present and mindful during interaction with her grandchildren.  Plan: Patient agrees to return for an appointment in 3-4 week and implement strategies discussed in session.    Diagnosis: Axis I: Depressive Disorder NOS    Axis II: Deferred    Angelly Spearing, LCSW 08/28/2015

## 2015-09-02 ENCOUNTER — Telehealth (HOSPITAL_COMMUNITY): Payer: Self-pay | Admitting: *Deleted

## 2015-09-02 NOTE — Telephone Encounter (Signed)
After reviewing with PA, called patient ans instructed her to take 1 potassium pill daily. The strength is 20 meq.

## 2015-09-06 ENCOUNTER — Ambulatory Visit (HOSPITAL_COMMUNITY)
Admission: RE | Admit: 2015-09-06 | Discharge: 2015-09-06 | Disposition: A | Discharge status: Home / Self Care | Payer: PPO | Source: Ambulatory Visit | Attending: Oncology | Admitting: Oncology

## 2015-09-06 ENCOUNTER — Encounter (HOSPITAL_BASED_OUTPATIENT_CLINIC_OR_DEPARTMENT_OTHER): Payer: PPO

## 2015-09-06 ENCOUNTER — Encounter (HOSPITAL_BASED_OUTPATIENT_CLINIC_OR_DEPARTMENT_OTHER): Payer: PPO | Admitting: Oncology

## 2015-09-06 ENCOUNTER — Encounter (HOSPITAL_COMMUNITY): Payer: Self-pay | Admitting: Oncology

## 2015-09-06 VITALS — BP 122/65 | HR 62 | Temp 98.1°F | Resp 18

## 2015-09-06 VITALS — BP 112/61 | HR 60 | Temp 97.9°F | Resp 18 | Ht 65.0 in | Wt 174.0 lb

## 2015-09-06 DIAGNOSIS — C649 Malignant neoplasm of unspecified kidney, except renal pelvis: Secondary | ICD-10-CM

## 2015-09-06 DIAGNOSIS — R062 Wheezing: Secondary | ICD-10-CM

## 2015-09-06 DIAGNOSIS — Z5112 Encounter for antineoplastic immunotherapy: Secondary | ICD-10-CM

## 2015-09-06 DIAGNOSIS — R0602 Shortness of breath: Secondary | ICD-10-CM

## 2015-09-06 DIAGNOSIS — R918 Other nonspecific abnormal finding of lung field: Secondary | ICD-10-CM | POA: Insufficient documentation

## 2015-09-06 DIAGNOSIS — C7800 Secondary malignant neoplasm of unspecified lung: Secondary | ICD-10-CM

## 2015-09-06 LAB — CBC WITH DIFFERENTIAL/PLATELET
Basophils Absolute: 0 K/uL (ref 0.0–0.1)
Basophils Relative: 0 %
Eosinophils Absolute: 0.3 K/uL (ref 0.0–0.7)
Eosinophils Relative: 4 %
HCT: 36.4 % (ref 36.0–46.0)
Hemoglobin: 11.9 g/dL — ABNORMAL LOW (ref 12.0–15.0)
Lymphocytes Relative: 20 %
Lymphs Abs: 1.6 K/uL (ref 0.7–4.0)
MCH: 31.9 pg (ref 26.0–34.0)
MCHC: 32.7 g/dL (ref 30.0–36.0)
MCV: 97.6 fL (ref 78.0–100.0)
Monocytes Absolute: 0.4 K/uL (ref 0.1–1.0)
Monocytes Relative: 6 %
Neutro Abs: 5.5 K/uL (ref 1.7–7.7)
Neutrophils Relative %: 70 %
Platelets: 184 K/uL (ref 150–400)
RBC: 3.73 MIL/uL — ABNORMAL LOW (ref 3.87–5.11)
RDW: 15.1 % (ref 11.5–15.5)
WBC: 7.8 K/uL (ref 4.0–10.5)

## 2015-09-06 LAB — COMPREHENSIVE METABOLIC PANEL WITH GFR
ALT: 11 U/L — ABNORMAL LOW (ref 14–54)
AST: 15 U/L (ref 15–41)
Albumin: 3.9 g/dL (ref 3.5–5.0)
Alkaline Phosphatase: 71 U/L (ref 38–126)
Anion gap: 8 (ref 5–15)
BUN: 30 mg/dL — ABNORMAL HIGH (ref 6–20)
CO2: 15 mmol/L — ABNORMAL LOW (ref 22–32)
Calcium: 9.2 mg/dL (ref 8.9–10.3)
Chloride: 115 mmol/L — ABNORMAL HIGH (ref 101–111)
Creatinine, Ser: 1.76 mg/dL — ABNORMAL HIGH (ref 0.44–1.00)
GFR calc Af Amer: 34 mL/min — ABNORMAL LOW
GFR calc non Af Amer: 29 mL/min — ABNORMAL LOW
Glucose, Bld: 113 mg/dL — ABNORMAL HIGH (ref 65–99)
Potassium: 3.9 mmol/L (ref 3.5–5.1)
Sodium: 138 mmol/L (ref 135–145)
Total Bilirubin: 0.4 mg/dL (ref 0.3–1.2)
Total Protein: 7.4 g/dL (ref 6.5–8.1)

## 2015-09-06 MED ORDER — SODIUM CHLORIDE 0.9 % IV SOLN
Freq: Once | INTRAVENOUS | Status: AC
  Administered 2015-09-06: 11:00:00 via INTRAVENOUS

## 2015-09-06 MED ORDER — IPRATROPIUM-ALBUTEROL 0.5-2.5 (3) MG/3ML IN SOLN
3.0000 mL | Freq: Four times a day (QID) | RESPIRATORY_TRACT | Status: DC | PRN
  Administered 2015-09-06: 3 mL via RESPIRATORY_TRACT
  Filled 2015-09-06: qty 3

## 2015-09-06 MED ORDER — SODIUM CHLORIDE 0.9 % IV SOLN
240.0000 mg | Freq: Once | INTRAVENOUS | Status: AC
  Administered 2015-09-06: 240 mg via INTRAVENOUS
  Filled 2015-09-06: qty 20

## 2015-09-06 MED ORDER — ALBUTEROL SULFATE HFA 108 (90 BASE) MCG/ACT IN AERS: 2.0000 | INHALATION_SPRAY | Freq: Four times a day (QID) | RESPIRATORY_TRACT | 5 refills | Status: AC | PRN

## 2015-09-06 MED ORDER — HEPARIN SOD (PORK) LOCK FLUSH 100 UNIT/ML IV SOLN
INTRAVENOUS | Status: AC
  Filled 2015-09-06: qty 5

## 2015-09-06 MED ORDER — HEPARIN SOD (PORK) LOCK FLUSH 100 UNIT/ML IV SOLN
500.0000 [IU] | Freq: Once | INTRAVENOUS | Status: AC | PRN
  Administered 2015-09-06: 500 [IU]

## 2015-09-06 MED ORDER — SODIUM CHLORIDE 0.9 % IJ SOLN
10.0000 mL | INTRAMUSCULAR | Status: DC | PRN
  Administered 2015-09-06: 10 mL
  Filled 2015-09-06: qty 10
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
## 2015-09-06 NOTE — Patient Instructions (Signed)
[facility] Discharge Instructions for Patients Receiving Chemotherapy   Beginning January 23rd 2017 lab work for the [facility] will be done in the  Main lab at [facility] on 1st floor. If you have a lab appointment with the [facility] please come in thru the  Main Entrance and check in at the main information desk   Today you received the following chemotherapy agents Opdivo. Follow-up as scheduled. Call clinic for any questions or concerns  To help prevent nausea and vomiting after your treatment, we encourage you to take your nausea medication   If you develop nausea and vomiting, or diarrhea that is not controlled by your medication, call the clinic.  The clinic phone number is [phone]. Office hours are Monday-Friday 8:30am-5:00pm.  BELOW ARE SYMPTOMS THAT SHOULD BE REPORTED IMMEDIATELY:  *FEVER GREATER THAN 101.0 F  *CHILLS WITH OR WITHOUT FEVER  NAUSEA AND VOMITING THAT IS NOT CONTROLLED WITH YOUR NAUSEA MEDICATION  *UNUSUAL SHORTNESS OF BREATH  *UNUSUAL BRUISING OR BLEEDING  TENDERNESS IN MOUTH AND THROAT WITH OR WITHOUT PRESENCE OF ULCERS  *URINARY PROBLEMS  *BOWEL PROBLEMS  UNUSUAL RASH Items with * indicate a potential emergency and should be followed up as soon as possible. If you have an emergency after office hours please contact your primary care physician or go to the nearest emergency department.  Please call the clinic during office hours if you have any questions or concerns.   You may also contact the Patient Navigator at [phone] should you have any questions or need assistance in obtaining follow up care.      Resources For Cancer Patients and their Caregivers ? American Cancer Society: Can assist with transportation, wigs, general needs, runs Look Good Feel Better.        316-501-4829 ? Cancer Care: Provides financial assistance, online support groups, medication/co-pay assistance.  1-800-813-HOPE  339-858-3888) ? [place] Assists [place] cancer patients and their families through emotional , educational and financial support.  203-629-3344 ? [place] DSS Where to apply for food stamps, Medicaid and utility assistance. (629)770-2527 ? RCATS: Transportation to medical appointments. 801-817-0924 ? Social Security Administration: May apply for disability if have a Stage IV cancer. [phone] [phone] ? [employer], Disability and Transit Services: Assists with nutrition, care and transit needs. [phone]

## 2015-09-06 NOTE — Progress Notes (Signed)
[name] [name] tolerated chemo tx well without issues but pt did have audible wheezing noted on admission to clinic today Pt seen by [name] PA and received nebulizer tx prior to chemo tx. Pt will also go down for CXR after discharge from clinic per PA. VSS upon discharge. Pt discharged via wheelchair in stabel condition with husband

## 2015-09-06 NOTE — Progress Notes (Signed)
Expiratory wheeze note post nebulizer treatment.

## 2015-09-06 NOTE — Assessment & Plan Note (Addendum)
Stage IV RCC, on single agent Nivolumab, managed by [facility], Dr. Vito Berger.  Recent development of hypercalcemia likely secondary to contrasted imaging performed at [facility] causing renal damage and subsequent hypercalcemia.  Oncology history is updated.    Oncology Flowsheet 05/24/2015  zolendronic acid (ZOMETA) IV 3 mg    Labs today: CBC diff, CMET, TSH. I personally reviewed and went over laboratory results with the patient.  The results are noted within this dictation.    She saw Dr. Vito Berger on 08/26/2015.  His plan follows below: Plan: I reviewed the imaging with response/stable findings and plan to continue the nivolumab.  I again discussed potential management options in the setting of POD including but not limited to cabozantinib (CABOMETYX). I explained that it is indicated in patients with advanced clear cell RCC after prior treatment with a TKI. I reviewed the significant improvement in PFS compared to everolimus (7.4 mo vs 3.8 mo, HR=0.58 (95% CI: 0.45-0.74); p<0.0001), improvement in overall survival (21.4 mo vs 16.5 mo, HR=0.66 (95% CI: 0.53-0.83), p=0.0003) and objective response rate (17% vs 3%, p<0.0001). I discussed potential side effects including but not limited to diarrhea, fatigue, nausea, decreased appetite, PPES, HTN, vomiting, weight loss, and constipation. I discussed the potential need for dose reduction (60%) and dose withholds (70%) and the discontinuation rate of 10%. I explained the need to without food - no food 2 hours prior and 1 hour after. I also again reviewed other potential therapies including everolimus, axitinib and lenvatinib and everolimus. Issues with tolerability with pazopanib in past that will need to be taken into consideration with any switch to other therapy. Cough - no signs of pneumonitis. May be related to allergies/post-nasal drip. Trial of claritin 10 mg daily OTC and follow up with local medical oncology for monitoring. May need bronchodilator and will  follow up with PCP.  Fatigue - stable.  CRI - monitor.  Pain - oxycodone PRN. Depression - seeing Psychiatry locally.  She will return in 3 months with visit, labs and imaging and she will call prior to next visit if any additional questions or problems arise.   She notes wheezing that has been ongoing for some time, weeks.  She admits to some sick contacts at home.  Her imaging at [facility] was negative for radiographic indications for medication-induced pneumonitis or pulmonary abnormalities that were acute.  Her rhonchi are noted in Dr. Verlon Au note.  She is on Claritin 10 mg daily.  I will order the following:  1. Duoneb treatment today in the clinic.  2. Chest xray prior to treatment.  3. Albuterol inhaler. She will continue with OTC medications for symptomatic control.  If her symptoms change or her chest xray is abnormal and concerning for infection, will provide an antibiotic accordingly.  She will also call us in the interim if changing symptoms.  Treatment with Nivolumab as planned today.  Treatment in 2 weeks.  Return in 4 weeks for follow-up with pre-treatment labs.  She has follow-up at [facility] in 3 months.

## 2015-09-06 NOTE — Patient Instructions (Signed)
[place] at [facility] Discharge Instructions  RECOMMENDATIONS MADE BY THE CONSULTANT AND ANY TEST RESULTS WILL BE SENT TO YOUR REFERRING PHYSICIAN.  You saw [name] PA-C today. You had a duoneb treatment today as well as a chest x-ray. Continue taking the mucinex @ home and call with any new cold symptoms. Treatment as scheduled in 2 weeks and 4 weeks. Follow up in 4 weeks.   Thank you for choosing [place] at [facility] to provide your oncology and hematology care.  To afford each patient quality time with our provider, please arrive at least 15 minutes before your scheduled appointment time.   Beginning January 23rd 2017 lab work for the [facility] will be done in the  Main lab at [facility] on 1st floor. If you have a lab appointment with the [facility] please come in thru the  Main Entrance and check in at the main information desk  You need to re-schedule your appointment should you arrive 10 or more minutes late.  We strive to give you quality time with our providers, and arriving late affects you and other patients whose appointments are after yours.  Also, if you no show three or more times for appointments you may be dismissed from the clinic at the providers discretion.     Again, thank you for choosing [facility].  Our hope is that these requests will decrease the amount of time that you wait before being seen by our physicians.       _____________________________________________________________  Should you have questions after your visit to [facility], please contact our office at [phone] between the hours of 8:30 a.m. and 4:30 p.m.  Voicemails left after 4:30 p.m. will not be returned until the following business day.  For prescription refill requests, have your pharmacy contact our office.         Resources For Cancer Patients and their Caregivers ? American Cancer  Society: Can assist with transportation, wigs, general needs, runs Look Good Feel Better.        260-477-9310 ? Cancer Care: Provides financial assistance, online support groups, medication/co-pay assistance.  1-800-813-HOPE (248)885-9952) ? [place] Assists [place] cancer patients and their families through emotional , educational and financial support.  2243732168 ? [place] DSS Where to apply for food stamps, Medicaid and utility assistance. 519-250-7110 ? [facility]: Transportation to medical appointments. 364-157-9267 ? Social Security Administration: May apply for disability if have a Stage IV cancer. 706-477-7190 2182439424 ? [employer], Disability and Transit Services: Assists with nutrition, care and transit needs. [place] Support Programs: '@10RELATIVEDAYS'$ @ > Cancer Support Group  2nd Tuesday of the month 1pm-2pm, Journey Room  > Creative Journey  3rd Tuesday of the month 1130am-1pm, Journey Room  > Look Good Feel Better  1st Wednesday of the month 10am-12 noon, Journey Room (Call [place] to register [phone])

## 2015-09-13 ENCOUNTER — Other Ambulatory Visit (HOSPITAL_COMMUNITY): Payer: Self-pay | Admitting: Oncology

## 2015-09-13 DIAGNOSIS — E876 Hypokalemia: Secondary | ICD-10-CM

## 2015-09-17 ENCOUNTER — Encounter (HOSPITAL_COMMUNITY): Payer: Self-pay | Admitting: Psychiatry

## 2015-09-17 ENCOUNTER — Ambulatory Visit (INDEPENDENT_AMBULATORY_CARE_PROVIDER_SITE_OTHER): Payer: PPO | Admitting: Psychiatry

## 2015-09-17 VITALS — BP 122/87 | HR 74 | Ht 65.0 in | Wt 172.2 lb

## 2015-09-17 DIAGNOSIS — F32A Depression, unspecified: Secondary | ICD-10-CM

## 2015-09-17 DIAGNOSIS — F329 Major depressive disorder, single episode, unspecified: Secondary | ICD-10-CM | POA: Diagnosis not present

## 2015-09-17 MED ORDER — DIAZEPAM 10 MG PO TABS: 10.0000 mg | ORAL_TABLET | Freq: Four times a day (QID) | ORAL | 2 refills | Status: DC

## 2015-09-17 MED ORDER — ZOLPIDEM TARTRATE 5 MG PO TABS: 5.0000 mg | ORAL_TABLET | Freq: Every evening | ORAL | 2 refills | Status: DC | PRN

## 2015-09-17 MED ORDER — PAROXETINE HCL ER 37.5 MG PO TB24: 37.5000 mg | ORAL_TABLET | Freq: Every day | ORAL | 2 refills | Status: DC
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
## 2015-09-20 ENCOUNTER — Encounter (HOSPITAL_COMMUNITY): Payer: PPO | Attending: Hematology and Oncology

## 2015-09-20 VITALS — BP 130/55 | HR 72 | Temp 98.0°F | Resp 20 | Wt 173.2 lb

## 2015-09-20 DIAGNOSIS — C7800 Secondary malignant neoplasm of unspecified lung: Secondary | ICD-10-CM | POA: Diagnosis not present

## 2015-09-20 DIAGNOSIS — C649 Malignant neoplasm of unspecified kidney, except renal pelvis: Secondary | ICD-10-CM

## 2015-09-20 DIAGNOSIS — R918 Other nonspecific abnormal finding of lung field: Secondary | ICD-10-CM | POA: Insufficient documentation

## 2015-09-20 DIAGNOSIS — R5383 Other fatigue: Secondary | ICD-10-CM | POA: Insufficient documentation

## 2015-09-20 DIAGNOSIS — K219 Gastro-esophageal reflux disease without esophagitis: Secondary | ICD-10-CM | POA: Diagnosis not present

## 2015-09-20 DIAGNOSIS — Z5112 Encounter for antineoplastic immunotherapy: Secondary | ICD-10-CM

## 2015-09-20 LAB — CBC WITH DIFFERENTIAL/PLATELET
BASOS ABS: 0 10*3/uL (ref 0.0–0.1)
Basophils Relative: 0 %
EOS ABS: 0.3 10*3/uL (ref 0.0–0.7)
EOS PCT: 4 %
HEMATOCRIT: 38.8 % (ref 36.0–46.0)
Hemoglobin: 12.6 g/dL (ref 12.0–15.0)
Lymphocytes Relative: 15 %
Lymphs Abs: 1.4 10*3/uL (ref 0.7–4.0)
MCH: 31.9 pg (ref 26.0–34.0)
MCHC: 32.5 g/dL (ref 30.0–36.0)
MCV: 98.2 fL (ref 78.0–100.0)
Monocytes Absolute: 0.3 10*3/uL (ref 0.1–1.0)
Monocytes Relative: 4 %
Neutro Abs: 7.3 10*3/uL (ref 1.7–7.7)
Neutrophils Relative %: 77 %
PLATELETS: 220 10*3/uL (ref 150–400)
RBC: 3.95 MIL/uL (ref 3.87–5.11)
RDW: 15.7 % — AB (ref 11.5–15.5)
WBC: 9.4 10*3/uL (ref 4.0–10.5)

## 2015-09-20 LAB — COMPREHENSIVE METABOLIC PANEL
ALBUMIN: 4.2 g/dL (ref 3.5–5.0)
ALT: 14 U/L (ref 14–54)
AST: 18 U/L (ref 15–41)
Alkaline Phosphatase: 79 U/L (ref 38–126)
Anion gap: 10 (ref 5–15)
BUN: 29 mg/dL — AB (ref 6–20)
CHLORIDE: 113 mmol/L — AB (ref 101–111)
CO2: 14 mmol/L — ABNORMAL LOW (ref 22–32)
Calcium: 9 mg/dL (ref 8.9–10.3)
Creatinine, Ser: 1.76 mg/dL — ABNORMAL HIGH (ref 0.44–1.00)
GFR calc Af Amer: 34 mL/min — ABNORMAL LOW (ref >60)
GFR calc non Af Amer: 29 mL/min — ABNORMAL LOW (ref >60)
GLUCOSE: 176 mg/dL — AB (ref 65–99)
POTASSIUM: 3.3 mmol/L — AB (ref 3.5–5.1)
Sodium: 137 mmol/L (ref 135–145)
Total Bilirubin: 0.5 mg/dL (ref 0.3–1.2)
Total Protein: 7.9 g/dL (ref 6.5–8.1)

## 2015-09-20 LAB — TSH: TSH: 3.207 u[IU]/mL (ref 0.350–4.500)

## 2015-09-20 MED ORDER — HEPARIN SOD (PORK) LOCK FLUSH 100 UNIT/ML IV SOLN
500.0000 [IU] | Freq: Once | INTRAVENOUS | Status: AC | PRN
  Administered 2015-09-20: 500 [IU]
  Filled 2015-09-20: qty 5

## 2015-09-20 MED ORDER — SODIUM CHLORIDE 0.9 % IV SOLN
240.0000 mg | Freq: Once | INTRAVENOUS | Status: AC
  Administered 2015-09-20: 240 mg via INTRAVENOUS
  Filled 2015-09-20: qty 10

## 2015-09-20 MED ORDER — SODIUM CHLORIDE 0.9 % IJ SOLN
10.0000 mL | INTRAMUSCULAR | Status: DC | PRN
  Administered 2015-09-20: 10 mL
  Filled 2015-09-20: qty 10

## 2015-09-20 MED ORDER — SODIUM CHLORIDE 0.9 % IV SOLN
Freq: Once | INTRAVENOUS | Status: AC
  Administered 2015-09-20: 12:00:00 via INTRAVENOUS

## 2015-09-20 NOTE — Patient Instructions (Signed)
[facility] Discharge Instructions for Patients Receiving Chemotherapy   Beginning January 23rd 2017 lab work for the [facility] will be done in the  Main lab at [facility] on 1st floor. If you have a lab appointment with the [facility] please come in thru the  Main Entrance and check in at the main information desk   Today you received the following chemotherapy agents Opdivo. Follow-up as scheduled. Call clinic for any questions or concerns  To help prevent nausea and vomiting after your treatment, we encourage you to take your nausea medication   If you develop nausea and vomiting, or diarrhea that is not controlled by your medication, call the clinic.  The clinic phone number is [phone]. Office hours are Monday-Friday 8:30am-5:00pm.  BELOW ARE SYMPTOMS THAT SHOULD BE REPORTED IMMEDIATELY:  *FEVER GREATER THAN 101.0 F  *CHILLS WITH OR WITHOUT FEVER  NAUSEA AND VOMITING THAT IS NOT CONTROLLED WITH YOUR NAUSEA MEDICATION  *UNUSUAL SHORTNESS OF BREATH  *UNUSUAL BRUISING OR BLEEDING  TENDERNESS IN MOUTH AND THROAT WITH OR WITHOUT PRESENCE OF ULCERS  *URINARY PROBLEMS  *BOWEL PROBLEMS  UNUSUAL RASH Items with * indicate a potential emergency and should be followed up as soon as possible. If you have an emergency after office hours please contact your primary care physician or go to the nearest emergency department.  Please call the clinic during office hours if you have any questions or concerns.   You may also contact the Patient Navigator at [phone] should you have any questions or need assistance in obtaining follow up care.      Resources For Cancer Patients and their Caregivers ? American Cancer Society: Can assist with transportation, wigs, general needs, runs Look Good Feel Better.        316-501-4829 ? Cancer Care: Provides financial assistance, online support groups, medication/co-pay assistance.  1-800-813-HOPE  339-858-3888) ? [place] Assists [place] cancer patients and their families through emotional , educational and financial support.  203-629-3344 ? [place] DSS Where to apply for food stamps, Medicaid and utility assistance. (629)770-2527 ? RCATS: Transportation to medical appointments. 801-817-0924 ? Social Security Administration: May apply for disability if have a Stage IV cancer. [phone] [phone] ? [employer], Disability and Transit Services: Assists with nutrition, care and transit needs. [phone]

## 2015-09-20 NOTE — Progress Notes (Signed)
[name] [name] tolerated chemo tx well without issues. Pt's K+ was 3.3 today and she will continue to take her K-DUR daily. VSS upon discharge. Pt discharged via wheelchair in stable condition

## 2015-09-21 ENCOUNTER — Other Ambulatory Visit (HOSPITAL_COMMUNITY): Payer: Self-pay | Admitting: Hematology & Oncology

## 2015-09-23 ENCOUNTER — Ambulatory Visit (INDEPENDENT_AMBULATORY_CARE_PROVIDER_SITE_OTHER): Payer: PPO | Admitting: Psychiatry

## 2015-09-23 ENCOUNTER — Telehealth (HOSPITAL_COMMUNITY): Payer: Self-pay | Admitting: *Deleted

## 2015-09-23 ENCOUNTER — Encounter (HOSPITAL_COMMUNITY): Payer: Self-pay | Admitting: Psychiatry

## 2015-09-23 DIAGNOSIS — F329 Major depressive disorder, single episode, unspecified: Secondary | ICD-10-CM | POA: Diagnosis not present

## 2015-09-23 DIAGNOSIS — F32A Depression, unspecified: Secondary | ICD-10-CM

## 2015-09-23 NOTE — Telephone Encounter (Signed)
Pt aware of labs  

## 2015-09-23 NOTE — Telephone Encounter (Signed)
-----   Message from Baird Cancer, PA-C sent at 09/20/2015  7:37 PM EDT ----- WNL

## 2015-09-23 NOTE — Progress Notes (Signed)
      THERAPIST PROGRESS NOTE  Session Time:       Monday 09/23/2015 3:11 PM - 4:02 PM                              Participation Level: Active  Behavioral Response: CasualAlert/anxious/tearful    Type of Therapy: Individual Therapy  Treatment Goals:    1.  Verbalize an increased understanding of the steps to grieving the loss is brought on by her medical condition.      2. Identify feelings associated with medical condition.      3. Learn and implement skills for managing stress.    Treatment Goals addressed: 1,2.3   Interventions: ACT, Supportive  Summary: [name] [name] is a 65 y.o. female who presents with symptoms of depression and anxiety that have been intermittent for several years that have been well controlled until patient was diagnosed with cancer in 2013. Symptoms have worsened in recent months as patient reports constantly thinking about her diagnoses and fears cancer will spread. She has been given a prognosis of 2-4 years to live. Patient reports taking chemotherapy pills and reports extreme fatigue. Patient also experiences depression, anxiety, and crying spells  Patient reports trying to using thought stopping techniques since last session to cope with worry about things about her home and dealing with her children. However, she reports becoming more worried about her health in recent weeks as she is experiencing more pain and increasing difficulty walking. She reports falling in her yard yesterday. She also reports increased worry about husband's health as he also has experienced increased pain per patient's report. She reports she has been less argumentative. Suicidal/Homicidal: No    Therapist Response: reviewed symptoms, facilitated patient's expression of feelings regarding changed functioning, praised and reinforced patient's use of thought stopping techniques, assisted patient identify ways to use thought stopping techniques to intervene in spiraling negative  thoughts about husband and self   Plan: Patient agrees to return for an appointment in 3-4 week and implement strategies discussed in session.    Diagnosis: Axis I: Depressive Disorder NOS    Axis II: Deferred    Keon Pender, LCSW 09/23/2015

## 2015-09-24 DIAGNOSIS — M545 Low back pain: Secondary | ICD-10-CM | POA: Diagnosis not present

## 2015-09-24 DIAGNOSIS — M25552 Pain in left hip: Secondary | ICD-10-CM | POA: Diagnosis not present
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
## 2015-10-04 ENCOUNTER — Encounter (HOSPITAL_COMMUNITY): Payer: Self-pay | Admitting: Hematology & Oncology

## 2015-10-04 ENCOUNTER — Other Ambulatory Visit (HOSPITAL_COMMUNITY): Payer: Self-pay | Admitting: Pharmacist

## 2015-10-04 ENCOUNTER — Encounter (HOSPITAL_BASED_OUTPATIENT_CLINIC_OR_DEPARTMENT_OTHER): Payer: PPO

## 2015-10-04 ENCOUNTER — Encounter (HOSPITAL_BASED_OUTPATIENT_CLINIC_OR_DEPARTMENT_OTHER): Payer: PPO | Admitting: Hematology & Oncology

## 2015-10-04 VITALS — BP 150/57 | HR 71 | Temp 97.6°F | Resp 20 | Wt 177.0 lb

## 2015-10-04 VITALS — BP 128/50 | HR 67 | Temp 97.6°F | Resp 18

## 2015-10-04 DIAGNOSIS — R944 Abnormal results of kidney function studies: Secondary | ICD-10-CM | POA: Diagnosis not present

## 2015-10-04 DIAGNOSIS — R7989 Other specified abnormal findings of blood chemistry: Secondary | ICD-10-CM

## 2015-10-04 DIAGNOSIS — F329 Major depressive disorder, single episode, unspecified: Secondary | ICD-10-CM | POA: Diagnosis not present

## 2015-10-04 DIAGNOSIS — C649 Malignant neoplasm of unspecified kidney, except renal pelvis: Secondary | ICD-10-CM

## 2015-10-04 DIAGNOSIS — F32A Depression, unspecified: Secondary | ICD-10-CM

## 2015-10-04 DIAGNOSIS — R062 Wheezing: Secondary | ICD-10-CM

## 2015-10-04 DIAGNOSIS — K219 Gastro-esophageal reflux disease without esophagitis: Secondary | ICD-10-CM | POA: Diagnosis not present

## 2015-10-04 DIAGNOSIS — C7800 Secondary malignant neoplasm of unspecified lung: Secondary | ICD-10-CM

## 2015-10-04 DIAGNOSIS — R918 Other nonspecific abnormal finding of lung field: Secondary | ICD-10-CM | POA: Diagnosis not present

## 2015-10-04 DIAGNOSIS — J40 Bronchitis, not specified as acute or chronic: Secondary | ICD-10-CM

## 2015-10-04 DIAGNOSIS — R5383 Other fatigue: Secondary | ICD-10-CM | POA: Diagnosis not present

## 2015-10-04 DIAGNOSIS — Z5112 Encounter for antineoplastic immunotherapy: Secondary | ICD-10-CM | POA: Diagnosis not present

## 2015-10-04 LAB — CBC WITH DIFFERENTIAL/PLATELET
BASOS ABS: 0 10*3/uL (ref 0.0–0.1)
BASOS PCT: 0 %
EOS ABS: 0.2 10*3/uL (ref 0.0–0.7)
Eosinophils Relative: 3 %
HEMATOCRIT: 34 % — AB (ref 36.0–46.0)
HEMOGLOBIN: 10.9 g/dL — AB (ref 12.0–15.0)
Lymphocytes Relative: 15 %
Lymphs Abs: 1.3 10*3/uL (ref 0.7–4.0)
MCH: 32 pg (ref 26.0–34.0)
MCHC: 32.1 g/dL (ref 30.0–36.0)
MCV: 99.7 fL (ref 78.0–100.0)
Monocytes Absolute: 0.4 10*3/uL (ref 0.1–1.0)
Monocytes Relative: 4 %
NEUTROS ABS: 6.6 10*3/uL (ref 1.7–7.7)
NEUTROS PCT: 78 %
Platelets: 175 10*3/uL (ref 150–400)
RBC: 3.41 MIL/uL — ABNORMAL LOW (ref 3.87–5.11)
RDW: 16 % — AB (ref 11.5–15.5)
WBC: 8.5 10*3/uL (ref 4.0–10.5)

## 2015-10-04 LAB — COMPREHENSIVE METABOLIC PANEL
ALBUMIN: 3.7 g/dL (ref 3.5–5.0)
ALT: 13 U/L — AB (ref 14–54)
AST: 14 U/L — AB (ref 15–41)
Alkaline Phosphatase: 59 U/L (ref 38–126)
Anion gap: 7 (ref 5–15)
BILIRUBIN TOTAL: 0.4 mg/dL (ref 0.3–1.2)
BUN: 34 mg/dL — AB (ref 6–20)
CO2: 16 mmol/L — ABNORMAL LOW (ref 22–32)
CREATININE: 1.59 mg/dL — AB (ref 0.44–1.00)
Calcium: 9.2 mg/dL (ref 8.9–10.3)
Chloride: 115 mmol/L — ABNORMAL HIGH (ref 101–111)
GFR calc Af Amer: 38 mL/min — ABNORMAL LOW (ref >60)
GFR, EST NON AFRICAN AMERICAN: 33 mL/min — AB (ref >60)
GLUCOSE: 132 mg/dL — AB (ref 65–99)
POTASSIUM: 4.2 mmol/L (ref 3.5–5.1)
Sodium: 138 mmol/L (ref 135–145)
TOTAL PROTEIN: 7.1 g/dL (ref 6.5–8.1)

## 2015-10-04 LAB — TSH: TSH: 1.03 u[IU]/mL (ref 0.350–4.500)

## 2015-10-04 MED ORDER — HEPARIN SOD (PORK) LOCK FLUSH 100 UNIT/ML IV SOLN
500.0000 [IU] | Freq: Once | INTRAVENOUS | Status: AC | PRN
  Administered 2015-10-04: 500 [IU]
  Filled 2015-10-04: qty 5

## 2015-10-04 MED ORDER — IPRATROPIUM-ALBUTEROL 0.5-2.5 (3) MG/3ML IN SOLN: 3.0000 mL | Freq: Once | RESPIRATORY_TRACT | Status: DC

## 2015-10-04 MED ORDER — LEVOFLOXACIN 500 MG PO TABS
500.0000 mg | ORAL_TABLET | Freq: Once | ORAL | Status: AC
  Administered 2015-10-04: 500 mg via ORAL
  Filled 2015-10-04: qty 1

## 2015-10-04 MED ORDER — SODIUM CHLORIDE 0.9 % IV SOLN
240.0000 mg | Freq: Once | INTRAVENOUS | Status: AC
  Administered 2015-10-04: 240 mg via INTRAVENOUS
  Filled 2015-10-04: qty 4

## 2015-10-04 MED ORDER — IPRATROPIUM-ALBUTEROL 0.5-2.5 (3) MG/3ML IN SOLN
RESPIRATORY_TRACT | Status: AC
  Filled 2015-10-04: qty 3

## 2015-10-04 MED ORDER — SODIUM CHLORIDE 0.9 % IV SOLN
Freq: Once | INTRAVENOUS | Status: AC
  Administered 2015-10-04: 12:00:00 via INTRAVENOUS

## 2015-10-04 MED ORDER — SODIUM CHLORIDE 0.9 % IJ SOLN: 10.0000 mL | INTRAMUSCULAR | Status: DC | PRN

## 2015-10-04 MED ORDER — LEVOFLOXACIN 250 MG PO TABS: 250.0000 mg | ORAL_TABLET | Freq: Every day | ORAL | 0 refills | Status: DC

## 2015-10-04 NOTE — Patient Instructions (Signed)
[facility] Discharge Instructions for Patients Receiving Chemotherapy   Beginning January 23rd 2017 lab work for the Cancer Center will be done in the  Main lab at [place] on 1st floor. If you have a lab appointment with the Cancer Center please come in thru the  Main Entrance and check in at the main information desk   Today you received the following chemotherapy agents:  Opdivo  If you develop nausea and vomiting, or diarrhea that is not controlled by your medication, call the clinic.  The clinic phone number is [phone]. Office hours are Monday-Friday 8:30am-5:00pm.  BELOW ARE SYMPTOMS THAT SHOULD BE REPORTED IMMEDIATELY:  *FEVER GREATER THAN 101.0 F  *CHILLS WITH OR WITHOUT FEVER  NAUSEA AND VOMITING THAT IS NOT CONTROLLED WITH YOUR NAUSEA MEDICATION  *UNUSUAL SHORTNESS OF BREATH  *UNUSUAL BRUISING OR BLEEDING  TENDERNESS IN MOUTH AND THROAT WITH OR WITHOUT PRESENCE OF ULCERS  *URINARY PROBLEMS  *BOWEL PROBLEMS  UNUSUAL RASH Items with * indicate a potential emergency and should be followed up as soon as possible. If you have an emergency after office hours please contact your primary care physician or go to the nearest emergency department.  Please call the clinic during office hours if you have any questions or concerns.   You may also contact the Patient Navigator at [phone] should you have any questions or need assistance in obtaining follow up care.      Resources For Cancer Patients and their Caregivers ? American Cancer Society: Can assist with transportation, wigs, general needs, runs Look Good Feel Better.        1-888-227-6333 ? Cancer Care: Provides financial assistance, online support groups, medication/co-pay assistance.  1-800-813-HOPE (4673) ? [facility] Assists [place] cancer patients and their families through emotional , educational and financial support.   336-427-4357 ? [place] DSS Where to apply for food stamps, Medicaid and utility assistance. 336-342-1394 ? RCATS: Transportation to medical appointments. 336-347-2287 ? Social Security Administration: May apply for disability if have a Stage IV cancer. 336-342-7796 1-800-772-1213 ? [place] Aging, Disability and Transit Services: Assists with nutrition, care and transit needs. 336-349-2343         

## 2015-10-04 NOTE — Patient Instructions (Signed)
[place] at [facility] Discharge Instructions  RECOMMENDATIONS MADE BY THE CONSULTANT AND ANY TEST RESULTS WILL BE SENT TO YOUR REFERRING PHYSICIAN.  You were seen by Dr. Whitney Muse today. Return to center in 2 weeks for Opdivo Return to center in 4 weeks for Opdivo and Follow up At home albuterol nebs 3 times a day daily Prescription for Levaquin 250 mg by mouth  Thank you for choosing [place] at [facility] to provide your oncology and hematology care.  To afford each patient quality time with our provider, please arrive at least 15 minutes before your scheduled appointment time.   Beginning January 23rd 2017 lab work for the [facility] will be done in the  Main lab at [facility] on 1st floor. If you have a lab appointment with the [place] please come in thru the  Main Entrance and check in at the main information desk  You need to re-schedule your appointment should you arrive 10 or more minutes late.  We strive to give you quality time with our providers, and arriving late affects you and other patients whose appointments are after yours.  Also, if you no show three or more times for appointments you may be dismissed from the clinic at the providers discretion.     Again, thank you for choosing [facility].  Our hope is that these requests will decrease the amount of time that you wait before being seen by our physicians.       _____________________________________________________________  Should you have questions after your visit to [facility], please contact our office at [phone] between the hours of 8:30 a.m. and 4:30 p.m.  Voicemails left after 4:30 p.m. will not be returned until the following business day.  For prescription refill requests, have your pharmacy contact our office.         Resources For Cancer Patients and their Caregivers ? American Cancer Society: Can assist with  transportation, wigs, general needs, runs Look Good Feel Better.        (984) 426-6279 ? Cancer Care: Provides financial assistance, online support groups, medication/co-pay assistance.  1-800-813-HOPE 317-148-6155) ? [facility] Assists [place] cancer patients and their families through emotional , educational and financial support.  661-152-0919 ? [place] DSS Where to apply for food stamps, Medicaid and utility assistance. (740)884-4424 ? RCATS: Transportation to medical appointments. (309) 583-6056 ? Social Security Administration: May apply for disability if have a Stage IV cancer. 856-293-9173 (403)013-2380 ? [employer], Disability and Transit Services: Assists with nutrition, care and transit needs. [place] Support Programs: '@10RELATIVEDAYS'$ @ > Cancer Support Group  2nd Tuesday of the month 1pm-2pm, Journey Room  > Creative Journey  3rd Tuesday of the month 1130am-1pm, Journey Room  > Look Good Feel Better  1st Wednesday of the month 10am-12 noon, Journey Room (Call [place] to register [phone])

## 2015-10-04 NOTE — Progress Notes (Signed)
Tolerated infusion w/o adverse reaction.  A&Ox4, in no distress.  VSS.  Discharged via wheelchair in c/o spouse.

## 2015-10-07 ENCOUNTER — Telehealth (HOSPITAL_COMMUNITY): Payer: Self-pay | Admitting: *Deleted

## 2015-10-07 NOTE — Telephone Encounter (Signed)
-----   Message from [facility], PA-C sent at 10/05/2015 10:46 PM EDT ----- WNL

## 2015-10-07 NOTE — Telephone Encounter (Signed)
Pt aware of results 

## 2015-10-08 ENCOUNTER — Telehealth (HOSPITAL_COMMUNITY): Payer: Self-pay | Admitting: *Deleted

## 2015-10-08 ENCOUNTER — Encounter (HOSPITAL_COMMUNITY): Payer: Self-pay | Admitting: Hematology & Oncology

## 2015-10-08 NOTE — Telephone Encounter (Signed)
After reviewing with Dr. Whitney Muse, notified patient that we will get her in with Dr. Luan Pulling asap due to her horrible wheezing, cough. Dr. Whitney Muse has put her on antibiotics and nebs with no help. Explained to patient that we will call her as soon as appointment is made.

## 2015-10-14 ENCOUNTER — Encounter (HOSPITAL_COMMUNITY): Payer: Self-pay | Admitting: Psychiatry

## 2015-10-14 ENCOUNTER — Ambulatory Visit (INDEPENDENT_AMBULATORY_CARE_PROVIDER_SITE_OTHER): Payer: PPO | Admitting: Psychiatry

## 2015-10-14 DIAGNOSIS — F418 Other specified anxiety disorders: Secondary | ICD-10-CM

## 2015-10-14 NOTE — Progress Notes (Signed)
      THERAPIST PROGRESS NOTE  Session Time:       Monday 10/14/2015 3:14 PM  - 4:00 PM    Participation Level: Active  Behavioral Response: CasualAlert/anxious/tearful at times    Type of Therapy: Individual Therapy  Treatment Goals:    1.  Verbalize an increased understanding of the steps to grieving the loss is brought on by her medical condition.      2. Identify feelings associated with medical condition.      3. Learn and implement skills for managing stress.    Treatment Goals addressed: 1,2.3   Interventions: ACT, Supportive  Summary: [name] is a 65 y.o. female who presents with symptoms of depression and anxiety that have been intermittent for several years that have been well controlled until patient was diagnosed with cancer in 2013. Symptoms have worsened in recent months as patient reports constantly thinking about her diagnoses and fears cancer will spread. She has been given a prognosis of 2-4 years to live. Patient reports taking chemotherapy pills and reports extreme fatigue. Patient also experiences depression, anxiety, and crying spells  Patient last was seen 3 weeks ago. She was diagnosed with bronchitis 1 week ago. She has been referred to pulmonologist but has been unable to get an appointment. She is worried lymph nodes may swell due to bronchitis. She is having difficulty sleeping due to wheezing. She has started ruminating at night about her diagnosis of cancer and fears bronchitis will lead to pnuemonia. She reports successfully using thought stopping techniques to reduce worry about other issues prior to her diagnosis of bronchitis. She also reports she has been getting out more and reports enjoying attending a wedding yesterday. She continues to express concerns about her children but does not appear to be as stressed about this. Patient has resumed journaling . Suicidal/Homicidal: No    Therapist Response: reviewed symptoms, facilitated patient's  expression of feelings regarding changed functioning, praised and reinforced patient's use of thought stopping techniques,reviewed and revised treatment plan  Plan: Patient agrees to return for an appointment in 3-4 week and implement strategies discussed in session.    Diagnosis: Axis I: Depressive Disorder NOS    Axis II: Deferred    BYNUM,PEGGY, LCSW 10/14/2015

## 2015-10-18 ENCOUNTER — Encounter (HOSPITAL_COMMUNITY): Payer: PPO | Attending: Hematology and Oncology

## 2015-10-18 ENCOUNTER — Telehealth (HOSPITAL_COMMUNITY): Payer: Self-pay | Admitting: *Deleted

## 2015-10-18 VITALS — BP 142/49 | HR 80 | Temp 98.3°F | Resp 18 | Wt 170.6 lb

## 2015-10-18 DIAGNOSIS — K219 Gastro-esophageal reflux disease without esophagitis: Secondary | ICD-10-CM | POA: Insufficient documentation

## 2015-10-18 DIAGNOSIS — C7800 Secondary malignant neoplasm of unspecified lung: Secondary | ICD-10-CM

## 2015-10-18 DIAGNOSIS — Z5112 Encounter for antineoplastic immunotherapy: Secondary | ICD-10-CM | POA: Diagnosis not present

## 2015-10-18 DIAGNOSIS — C649 Malignant neoplasm of unspecified kidney, except renal pelvis: Secondary | ICD-10-CM | POA: Insufficient documentation

## 2015-10-18 DIAGNOSIS — R5383 Other fatigue: Secondary | ICD-10-CM | POA: Insufficient documentation

## 2015-10-18 DIAGNOSIS — R918 Other nonspecific abnormal finding of lung field: Secondary | ICD-10-CM | POA: Insufficient documentation

## 2015-10-18 LAB — COMPREHENSIVE METABOLIC PANEL
ALBUMIN: 4 g/dL (ref 3.5–5.0)
ALK PHOS: 75 U/L (ref 38–126)
ALT: 14 U/L (ref 14–54)
ANION GAP: 7 (ref 5–15)
AST: 19 U/L (ref 15–41)
BILIRUBIN TOTAL: 0.4 mg/dL (ref 0.3–1.2)
BUN: 29 mg/dL — AB (ref 6–20)
CO2: 16 mmol/L — AB (ref 22–32)
Calcium: 9 mg/dL (ref 8.9–10.3)
Chloride: 115 mmol/L — ABNORMAL HIGH (ref 101–111)
Creatinine, Ser: 1.9 mg/dL — ABNORMAL HIGH (ref 0.44–1.00)
GFR calc Af Amer: 31 mL/min — ABNORMAL LOW (ref >60)
GFR calc non Af Amer: 26 mL/min — ABNORMAL LOW (ref >60)
GLUCOSE: 193 mg/dL — AB (ref 65–99)
POTASSIUM: 3.9 mmol/L (ref 3.5–5.1)
SODIUM: 138 mmol/L (ref 135–145)
Total Protein: 7.9 g/dL (ref 6.5–8.1)

## 2015-10-18 LAB — CBC WITH DIFFERENTIAL/PLATELET
BASOS ABS: 0 10*3/uL (ref 0.0–0.1)
BASOS PCT: 0 %
EOS ABS: 0.3 10*3/uL (ref 0.0–0.7)
Eosinophils Relative: 3 %
HEMATOCRIT: 38 % (ref 36.0–46.0)
HEMOGLOBIN: 12.4 g/dL (ref 12.0–15.0)
Lymphocytes Relative: 13 %
Lymphs Abs: 1.1 10*3/uL (ref 0.7–4.0)
MCH: 32.5 pg (ref 26.0–34.0)
MCHC: 32.6 g/dL (ref 30.0–36.0)
MCV: 99.5 fL (ref 78.0–100.0)
MONOS PCT: 3 %
Monocytes Absolute: 0.2 10*3/uL (ref 0.1–1.0)
NEUTROS ABS: 6.8 10*3/uL (ref 1.7–7.7)
NEUTROS PCT: 81 %
Platelets: 209 10*3/uL (ref 150–400)
RBC: 3.82 MIL/uL — ABNORMAL LOW (ref 3.87–5.11)
RDW: 15.9 % — AB (ref 11.5–15.5)
WBC: 8.4 10*3/uL (ref 4.0–10.5)

## 2015-10-18 MED ORDER — SODIUM CHLORIDE 0.9 % IJ SOLN
10.0000 mL | INTRAMUSCULAR | Status: DC | PRN
  Administered 2015-10-18: 10 mL
  Filled 2015-10-18: qty 10

## 2015-10-18 MED ORDER — SODIUM CHLORIDE 0.9 % IV SOLN
Freq: Once | INTRAVENOUS | Status: AC
  Administered 2015-10-18: 15:00:00 via INTRAVENOUS

## 2015-10-18 MED ORDER — HEPARIN SOD (PORK) LOCK FLUSH 100 UNIT/ML IV SOLN
500.0000 [IU] | Freq: Once | INTRAVENOUS | Status: AC | PRN
  Administered 2015-10-18: 500 [IU]
  Filled 2015-10-18: qty 5

## 2015-10-18 MED ORDER — NIVOLUMAB CHEMO INJECTION 100 MG/10ML
240.0000 mg | Freq: Once | INTRAVENOUS | Status: AC
  Administered 2015-10-18: 240 mg via INTRAVENOUS
  Filled 2015-10-18: qty 20

## 2015-10-18 MED ORDER — OXYCODONE HCL 5 MG PO TABS: 5.0000 mg | ORAL_TABLET | Freq: Four times a day (QID) | ORAL | 0 refills | Status: DC | PRN

## 2015-10-18 NOTE — Progress Notes (Signed)
[name] [name] tolerated chemo tx well without complaints or incident. Labs reviewed with Dr. Whitney Muse prior to administering chemo. VSS upon dischagre Pt discharged via wheelchair in satisfactory condition with husband

## 2015-10-18 NOTE — Patient Instructions (Signed)
[facility] Discharge Instructions for Patients Receiving Chemotherapy   Beginning January 23rd 2017 lab work for the Cancer Center will be done in the  Main lab at [facility] on 1st floor. If you have a lab appointment with the Cancer Center please come in thru the  Main Entrance and check in at the main information desk   Today you received the following chemotherapy agents Opdivo. Follow-up as scheduled. Call clinic for any questions or concerns  To help prevent nausea and vomiting after your treatment, we encourage you to take your nausea medication   If you develop nausea and vomiting, or diarrhea that is not controlled by your medication, call the clinic.  The clinic phone number is [phone]. Office hours are Monday-Friday 8:30am-5:00pm.  BELOW ARE SYMPTOMS THAT SHOULD BE REPORTED IMMEDIATELY:  *FEVER GREATER THAN 101.0 F  *CHILLS WITH OR WITHOUT FEVER  NAUSEA AND VOMITING THAT IS NOT CONTROLLED WITH YOUR NAUSEA MEDICATION  *UNUSUAL SHORTNESS OF BREATH  *UNUSUAL BRUISING OR BLEEDING  TENDERNESS IN MOUTH AND THROAT WITH OR WITHOUT PRESENCE OF ULCERS  *URINARY PROBLEMS  *BOWEL PROBLEMS  UNUSUAL RASH Items with * indicate a potential emergency and should be followed up as soon as possible. If you have an emergency after office hours please contact your primary care physician or go to the nearest emergency department.  Please call the clinic during office hours if you have any questions or concerns.   You may also contact the Patient Navigator at [phone] should you have any questions or need assistance in obtaining follow up care.      Resources For Cancer Patients and their Caregivers ? American [employer]: Can assist with transportation, wigs, general needs, runs Look Good Feel Better.        1-888-227-6333 ? Cancer Care: Provides financial assistance, online support groups, medication/co-pay assistance.  1-800-813-HOPE  (4673) ? [facility] Assists [place] cancer patients and their families through emotional , educational and financial support.  336-427-4357 ? [place] DSS Where to apply for food stamps, Medicaid and utility assistance. 336-342-1394 ? [facility]: Transportation to medical appointments. 336-347-2287 ? Social Security Administration: May apply for disability if have a Stage IV cancer. 336-342-7796 1-800-772-1213 ? [place] [facility]: Assists with nutrition, care and transit needs. 336-349-2343         

## 2015-10-22 ENCOUNTER — Encounter (HOSPITAL_COMMUNITY): Payer: Self-pay | Admitting: Lab

## 2015-10-22 NOTE — Progress Notes (Unsigned)
Referral resent to Dr Luan Pulling on 10/10.  I called office and they will get in her ASAP per office.  Patient is aware

## 2015-10-30 ENCOUNTER — Other Ambulatory Visit (HOSPITAL_COMMUNITY): Payer: Self-pay | Admitting: Pulmonary Disease

## 2015-10-30 DIAGNOSIS — C641 Malignant neoplasm of right kidney, except renal pelvis: Secondary | ICD-10-CM | POA: Diagnosis not present

## 2015-10-30 DIAGNOSIS — R0602 Shortness of breath: Secondary | ICD-10-CM

## 2015-10-30 DIAGNOSIS — C649 Malignant neoplasm of unspecified kidney, except renal pelvis: Secondary | ICD-10-CM

## 2015-10-31 ENCOUNTER — Encounter (HOSPITAL_BASED_OUTPATIENT_CLINIC_OR_DEPARTMENT_OTHER): Payer: PPO

## 2015-10-31 ENCOUNTER — Other Ambulatory Visit (HOSPITAL_COMMUNITY): Payer: Self-pay | Admitting: Pulmonary Disease

## 2015-10-31 ENCOUNTER — Encounter (HOSPITAL_BASED_OUTPATIENT_CLINIC_OR_DEPARTMENT_OTHER): Payer: PPO | Admitting: Oncology

## 2015-10-31 ENCOUNTER — Other Ambulatory Visit (HOSPITAL_COMMUNITY): Payer: Self-pay | Admitting: Hematology & Oncology

## 2015-10-31 ENCOUNTER — Encounter (HOSPITAL_COMMUNITY): Payer: Self-pay | Admitting: Oncology

## 2015-10-31 ENCOUNTER — Ambulatory Visit (HOSPITAL_COMMUNITY)
Admission: RE | Admit: 2015-10-31 | Discharge: 2015-10-31 | Disposition: A | Discharge status: Home / Self Care | Payer: PPO | Source: Ambulatory Visit | Attending: Pulmonary Disease | Admitting: Pulmonary Disease

## 2015-10-31 VITALS — BP 116/55 | HR 70 | Temp 97.8°F | Resp 20

## 2015-10-31 DIAGNOSIS — C649 Malignant neoplasm of unspecified kidney, except renal pelvis: Secondary | ICD-10-CM | POA: Insufficient documentation

## 2015-10-31 DIAGNOSIS — I2510 Atherosclerotic heart disease of native coronary artery without angina pectoris: Secondary | ICD-10-CM | POA: Diagnosis not present

## 2015-10-31 DIAGNOSIS — C7800 Secondary malignant neoplasm of unspecified lung: Secondary | ICD-10-CM

## 2015-10-31 DIAGNOSIS — R0602 Shortness of breath: Secondary | ICD-10-CM | POA: Diagnosis not present

## 2015-10-31 DIAGNOSIS — I700 Atherosclerosis of aorta: Secondary | ICD-10-CM | POA: Diagnosis not present

## 2015-10-31 DIAGNOSIS — R918 Other nonspecific abnormal finding of lung field: Secondary | ICD-10-CM | POA: Diagnosis not present

## 2015-10-31 DIAGNOSIS — Z5112 Encounter for antineoplastic immunotherapy: Secondary | ICD-10-CM | POA: Diagnosis not present

## 2015-10-31 LAB — CBC WITH DIFFERENTIAL/PLATELET
Basophils Absolute: 0 10*3/uL (ref 0.0–0.1)
Basophils Relative: 0 %
EOS ABS: 0.3 10*3/uL (ref 0.0–0.7)
EOS PCT: 4 %
HCT: 36.4 % (ref 36.0–46.0)
Hemoglobin: 11.8 g/dL — ABNORMAL LOW (ref 12.0–15.0)
LYMPHS ABS: 1.4 10*3/uL (ref 0.7–4.0)
Lymphocytes Relative: 16 %
MCH: 32 pg (ref 26.0–34.0)
MCHC: 32.4 g/dL (ref 30.0–36.0)
MCV: 98.6 fL (ref 78.0–100.0)
MONO ABS: 0.5 10*3/uL (ref 0.1–1.0)
MONOS PCT: 6 %
Neutro Abs: 6.4 10*3/uL (ref 1.7–7.7)
Neutrophils Relative %: 74 %
PLATELETS: 202 10*3/uL (ref 150–400)
RBC: 3.69 MIL/uL — ABNORMAL LOW (ref 3.87–5.11)
RDW: 15.8 % — ABNORMAL HIGH (ref 11.5–15.5)
WBC: 8.6 10*3/uL (ref 4.0–10.5)

## 2015-10-31 LAB — COMPREHENSIVE METABOLIC PANEL
ALK PHOS: 83 U/L (ref 38–126)
ALT: 12 U/L — ABNORMAL LOW (ref 14–54)
ANION GAP: 6 (ref 5–15)
AST: 16 U/L (ref 15–41)
Albumin: 3.9 g/dL (ref 3.5–5.0)
BUN: 24 mg/dL — ABNORMAL HIGH (ref 6–20)
CALCIUM: 9.4 mg/dL (ref 8.9–10.3)
CO2: 17 mmol/L — AB (ref 22–32)
Chloride: 114 mmol/L — ABNORMAL HIGH (ref 101–111)
Creatinine, Ser: 1.81 mg/dL — ABNORMAL HIGH (ref 0.44–1.00)
GFR calc non Af Amer: 28 mL/min — ABNORMAL LOW (ref >60)
GFR, EST AFRICAN AMERICAN: 32 mL/min — AB (ref >60)
Glucose, Bld: 142 mg/dL — ABNORMAL HIGH (ref 65–99)
Potassium: 3.8 mmol/L (ref 3.5–5.1)
SODIUM: 137 mmol/L (ref 135–145)
Total Bilirubin: 0.5 mg/dL (ref 0.3–1.2)
Total Protein: 7.7 g/dL (ref 6.5–8.1)

## 2015-10-31 LAB — TSH: TSH: 1.675 u[IU]/mL (ref 0.350–4.500)

## 2015-10-31 MED ORDER — SODIUM CHLORIDE 0.9 % IJ SOLN
10.0000 mL | INTRAMUSCULAR | Status: DC | PRN
  Administered 2015-10-31: 10 mL
  Filled 2015-10-31: qty 10

## 2015-10-31 MED ORDER — HEPARIN SOD (PORK) LOCK FLUSH 100 UNIT/ML IV SOLN
500.0000 [IU] | Freq: Once | INTRAVENOUS | Status: AC | PRN
  Administered 2015-10-31: 500 [IU]
  Filled 2015-10-31: qty 5

## 2015-10-31 MED ORDER — SODIUM CHLORIDE 0.9 % IV SOLN
Freq: Once | INTRAVENOUS | Status: AC
Start: 2015-10-31 — End: 2015-10-31
  Administered 2015-10-31: 10:00:00 via INTRAVENOUS

## 2015-10-31 MED ORDER — HYDROCODONE-ACETAMINOPHEN 5-325 MG PO TABS
1.0000 | ORAL_TABLET | Freq: Once | ORAL | Status: AC
  Administered 2015-10-31: 1 via ORAL
  Filled 2015-10-31: qty 1

## 2015-10-31 MED ORDER — NIVOLUMAB CHEMO INJECTION 100 MG/10ML
240.0000 mg | Freq: Once | INTRAVENOUS | Status: AC
  Administered 2015-10-31: 240 mg via INTRAVENOUS
  Filled 2015-10-31: qty 20

## 2015-10-31 NOTE — Assessment & Plan Note (Addendum)
Stage IV RCC, on single agent Nivolumab, managed by [facility], Dr. Vito Berger.  Recent development of hypercalcemia likely secondary to contrasted imaging performed at [facility] causing renal damage and subsequent hypercalcemia.  Oncology history is updated.    Labs today: CBC diff, CMET, TSH. I personally reviewed and went over laboratory results with the patient.  The results are noted within this dictation.  Creatinine is noted to have climbed.  Today, it is stable and slightly improved.  This may be multifactorial, including recurrent exposure to IV contrast for imaging purposes, Zometa (last given in May 2017), or Nivolumab: Renal toxicity during treatment:  Creatinine >1.5 to 6 times ULN: Withhold treatment; administer corticosteroids (prednisone 0.5 to 1 mg/kg daily or equivalent) followed by a corticosteroid taper; may resume therapy upon recovery to grade 0 or 1 toxicity. If toxicity worsens or does not improve, increase corticosteroid dose to prednisone 1 to 2 mg/kg daily (or equivalent).  She saw Dr. Vito Berger on 08/26/2015.  His plan follows below: Plan: I reviewed the imaging with response/stable findings and plan to continue the nivolumab.  I again discussed potential management options in the setting of POD including but not limited to cabozantinib (CABOMETYX). I explained that it is indicated in patients with advanced clear cell RCC after prior treatment with a TKI. I reviewed the significant improvement in PFS compared to everolimus (7.4 mo vs 3.8 mo, HR=0.58 (95% CI: 0.45-0.74); p<0.0001), improvement in overall survival (21.4 mo vs 16.5 mo, HR=0.66 (95% CI: 0.53-0.83), p=0.0003) and objective response rate (17% vs 3%, p<0.0001). I discussed potential side effects including but not limited to diarrhea, fatigue, nausea, decreased appetite, PPES, HTN, vomiting, weight loss, and constipation. I discussed the potential need for dose reduction (60%) and dose withholds (70%) and the discontinuation rate of  10%. I explained the need to without food - no food 2 hours prior and 1 hour after. I also again reviewed other potential therapies including everolimus, axitinib and lenvatinib and everolimus. Issues with tolerability with pazopanib in past that will need to be taken into consideration with any switch to other therapy. Cough - no signs of pneumonitis. May be related to allergies/post-nasal drip. Trial of claritin 10 mg daily OTC and follow up with local medical oncology for monitoring. May need bronchodilator and will follow up with PCP.  Fatigue - stable.  CRI - monitor.  Pain - oxycodone PRN. Depression - seeing Psychiatry locally.  She will return in 3 months with visit, labs and imaging and she will call prior to next visit if any additional questions or problems arise.   Due to past hypercalcemia, Ca++ PO is on hold.   Continue same dose of levothyroxine.  She has seen Dr. Luan Pulling who prescribed Doxycycline and Medrol Dose-Pak.  She notes that she has not started this yet and plans on starting today or tomorrow.  Of note, steroids will interfere with effectiveness of immunotherapy, but this is prescribed to be a short course with a quick taper.  Treatment in 2 weeks and 4 weeks.  Return in 4 weeks for follow-up.

## 2015-10-31 NOTE — Patient Instructions (Signed)
[facility] Discharge Instructions for Patients Receiving Chemotherapy   Beginning January 23rd 2017 lab work for the [facility] will be done in the  Main lab at [facility] on 1st floor. If you have a lab appointment with the [facility] please come in thru the  Main Entrance and check in at the main information desk   Today you received the following chemotherapy agents Opdivo. Follow-up as scheduled. Call clinic for any questions or concerns  To help prevent nausea and vomiting after your treatment, we encourage you to take your nausea medication   If you develop nausea and vomiting, or diarrhea that is not controlled by your medication, call the clinic.  The clinic phone number is [phone]. Office hours are Monday-Friday 8:30am-5:00pm.  BELOW ARE SYMPTOMS THAT SHOULD BE REPORTED IMMEDIATELY:  *FEVER GREATER THAN 101.0 F  *CHILLS WITH OR WITHOUT FEVER  NAUSEA AND VOMITING THAT IS NOT CONTROLLED WITH YOUR NAUSEA MEDICATION  *UNUSUAL SHORTNESS OF BREATH  *UNUSUAL BRUISING OR BLEEDING  TENDERNESS IN MOUTH AND THROAT WITH OR WITHOUT PRESENCE OF ULCERS  *URINARY PROBLEMS  *BOWEL PROBLEMS  UNUSUAL RASH Items with * indicate a potential emergency and should be followed up as soon as possible. If you have an emergency after office hours please contact your primary care physician or go to the nearest emergency department.  Please call the clinic during office hours if you have any questions or concerns.   You may also contact the Patient Navigator at [phone] should you have any questions or need assistance in obtaining follow up care.      Resources For Cancer Patients and their Caregivers ? American Cancer Society: Can assist with transportation, wigs, general needs, runs Look Good Feel Better.        316-501-4829 ? Cancer Care: Provides financial assistance, online support groups, medication/co-pay assistance.  1-800-813-HOPE  339-858-3888) ? [place] Assists [place] cancer patients and their families through emotional , educational and financial support.  203-629-3344 ? [place] DSS Where to apply for food stamps, Medicaid and utility assistance. (629)770-2527 ? RCATS: Transportation to medical appointments. 801-817-0924 ? Social Security Administration: May apply for disability if have a Stage IV cancer. [phone] [phone] ? [employer], Disability and Transit Services: Assists with nutrition, care and transit needs. [phone]
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
## 2015-10-31 NOTE — Progress Notes (Signed)
[name] [name] tolerated chemo tx without incident but did complain of a headache which she rated a 7. PA notified and pt given a Hydrocodone as ordered. Upon discharge pt reported that her headache was better and rated it a 4. Labs reviewed with PA prior to administering chemo. VSS upon discharge. Pt discharged via wheelchair in stable condition

## 2015-10-31 NOTE — Patient Instructions (Signed)
[place] at [facility] Discharge Instructions  RECOMMENDATIONS MADE BY THE CONSULTANT AND ANY TEST RESULTS WILL BE SENT TO YOUR REFERRING PHYSICIAN.  You were seen by Gershon Mussel today. Treatment on 11/3 Treatment and follow up on 11/17  Thank you for choosing [place] at [facility] to provide your oncology and hematology care.  To afford each patient quality time with our provider, please arrive at least 15 minutes before your scheduled appointment time.   Beginning January 23rd 2017 lab work for the [facility] will be done in the  Main lab at [facility] on 1st floor. If you have a lab appointment with the [place] please come in thru the  Main Entrance and check in at the main information desk  You need to re-schedule your appointment should you arrive 10 or more minutes late.  We strive to give you quality time with our providers, and arriving late affects you and other patients whose appointments are after yours.  Also, if you no show three or more times for appointments you may be dismissed from the clinic at the providers discretion.     Again, thank you for choosing [facility].  Our hope is that these requests will decrease the amount of time that you wait before being seen by our physicians.       _____________________________________________________________  Should you have questions after your visit to [facility], please contact our office at [phone] between the hours of 8:30 a.m. and 4:30 p.m.  Voicemails left after 4:30 p.m. will not be returned until the following business day.  For prescription refill requests, have your pharmacy contact our office.         Resources For Cancer Patients and their Caregivers ? American Cancer Society: Can assist with transportation, wigs, general needs, runs Look Good Feel Better.        (816)195-8514 ? Cancer Care: Provides financial assistance,  online support groups, medication/co-pay assistance.  1-800-813-HOPE (512)784-0834) ? [place] Assists [place] cancer patients and their families through emotional , educational and financial support.  706-581-1331 ? [place] DSS Where to apply for food stamps, Medicaid and utility assistance. 848-210-8436 ? [facility]: Transportation to medical appointments. (620)246-6257 ? Social Security Administration: May apply for disability if have a Stage IV cancer. (780)815-3173 630 284 4449 ? [employer], Disability and Transit Services: Assists with nutrition, care and transit needs. [place] Support Programs: '@10RELATIVEDAYS'$ @ > Cancer Support Group  2nd Tuesday of the month 1pm-2pm, Journey Room  > Creative Journey  3rd Tuesday of the month 1130am-1pm, Journey Room  > Look Good Feel Better  1st Wednesday of the month 10am-12 noon, Journey Room (Call [place] to register [phone])

## 2015-11-01 ENCOUNTER — Ambulatory Visit (HOSPITAL_COMMUNITY): Payer: Self-pay | Admitting: Hematology & Oncology

## 2015-11-01 ENCOUNTER — Ambulatory Visit (HOSPITAL_COMMUNITY): Payer: Self-pay | Admitting: Oncology

## 2015-11-01 ENCOUNTER — Inpatient Hospital Stay (HOSPITAL_COMMUNITY): Payer: Self-pay

## 2015-11-01 ENCOUNTER — Telehealth (HOSPITAL_COMMUNITY): Payer: Self-pay | Admitting: Oncology

## 2015-11-01 NOTE — Telephone Encounter (Signed)
Dr. 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 This area is difficult to accurately quantify due to lack of intravenous contrast material, image number 60 of series 2. Nevertheless, there is abnormal soft tissue which is now encasing the left mainstem bronchus diminishing the caliber of this airway, image number 94 of series 5. The diameter of the left mainstem bronchus narrows to 3 mm, image 60 of series 3. Previously 1.2 cm.  Dr. Luan Pulling reports that CTS was contacted and they do not stent in this circumstance.  It is recommended that the patient be referred to interventional pulmonology.  I paged Dr. Vito Berger ([facility]) on numerous occassions this AM to update him and ask for his recommendations and hope that he can help guide this patient through the system to the correct provider manage this issue since he been managing this patient's treatment over the past number of years and most of her imaging is performed at [facility].  He recommended XRT, or possibly stenting.  I proceeded to call Dr. Tammi Klippel (XRT) for further radiation oncology recommendations and as expected, there is concern for completely occluding the patient's airway due to progression of disease, high risk of radioresistance, slow response to XRT if responsive, and progressive occlusion from side effects of XRT before improvement.  Therefore, Dr. Tammi Klippel is willing to attempt treatment with XRT, but those risks must be noted.  As a result, the consensus is to see if [facility] interventional pulmonology is willing to stent this area.  I paged [facility] on-call interventional pulmonologist.  I spoke with Dr. Hollie Salk.  He is agreeable with this intervention.  He will contact the patient with an appointment.  I contacted the patient and updated her about the abovementioned information in addition to  reviewing her scans with her.  She is advised to expect a call from [facility] for scheduling of this procedure.  She is agreeable with this plan.  All questions are answered to the best of my ability.  She is advised to call us Monday by Noon if she has not heard from [facility].  She is advised to report to the ED for worsening symptoms.  If admitted, she should be transferred to [facility] for management of this issue.  Shalaunda Weatherholtz, PA-C 11/01/2015 3:15 PM

## 2015-11-04 ENCOUNTER — Telehealth (HOSPITAL_COMMUNITY): Payer: Self-pay | Admitting: *Deleted

## 2015-11-04 ENCOUNTER — Ambulatory Visit (HOSPITAL_COMMUNITY): Payer: Self-pay | Admitting: Psychiatry

## 2015-11-04 DIAGNOSIS — Z79899 Other long term (current) drug therapy: Secondary | ICD-10-CM | POA: Diagnosis not present

## 2015-11-04 DIAGNOSIS — I10 Essential (primary) hypertension: Secondary | ICD-10-CM | POA: Diagnosis not present

## 2015-11-04 DIAGNOSIS — E119 Type 2 diabetes mellitus without complications: Secondary | ICD-10-CM | POA: Diagnosis not present

## 2015-11-04 DIAGNOSIS — R918 Other nonspecific abnormal finding of lung field: Secondary | ICD-10-CM | POA: Diagnosis not present

## 2015-11-04 DIAGNOSIS — J9859 Other diseases of mediastinum, not elsewhere classified: Secondary | ICD-10-CM | POA: Diagnosis not present

## 2015-11-04 DIAGNOSIS — J9809 Other diseases of bronchus, not elsewhere classified: Secondary | ICD-10-CM | POA: Diagnosis not present

## 2015-11-04 DIAGNOSIS — D491 Neoplasm of unspecified behavior of respiratory system: Secondary | ICD-10-CM | POA: Diagnosis not present

## 2015-11-04 DIAGNOSIS — Z9889 Other specified postprocedural states: Secondary | ICD-10-CM | POA: Diagnosis not present

## 2015-11-04 NOTE — Telephone Encounter (Signed)
patient left voice message stating she is having emergency surgery on Monday 11/04/15.

## 2015-11-05 NOTE — Telephone Encounter (Signed)
noted 

## 2015-11-11 ENCOUNTER — Other Ambulatory Visit (HOSPITAL_COMMUNITY): Payer: Self-pay | Admitting: Oncology

## 2015-11-11 DIAGNOSIS — C649 Malignant neoplasm of unspecified kidney, except renal pelvis: Secondary | ICD-10-CM

## 2015-11-11 DIAGNOSIS — R112 Nausea with vomiting, unspecified: Secondary | ICD-10-CM

## 2015-11-15 ENCOUNTER — Encounter (HOSPITAL_COMMUNITY): Payer: PPO | Attending: Hematology and Oncology

## 2015-11-15 ENCOUNTER — Telehealth (HOSPITAL_COMMUNITY): Payer: Self-pay | Admitting: Oncology

## 2015-11-15 ENCOUNTER — Encounter (HOSPITAL_COMMUNITY): Payer: Self-pay

## 2015-11-15 VITALS — BP 115/56 | HR 71 | Temp 98.0°F | Resp 20 | Wt 164.2 lb

## 2015-11-15 DIAGNOSIS — C7800 Secondary malignant neoplasm of unspecified lung: Secondary | ICD-10-CM | POA: Diagnosis not present

## 2015-11-15 DIAGNOSIS — R5383 Other fatigue: Secondary | ICD-10-CM | POA: Diagnosis not present

## 2015-11-15 DIAGNOSIS — Z5112 Encounter for antineoplastic immunotherapy: Secondary | ICD-10-CM

## 2015-11-15 DIAGNOSIS — R918 Other nonspecific abnormal finding of lung field: Secondary | ICD-10-CM | POA: Diagnosis not present

## 2015-11-15 DIAGNOSIS — C649 Malignant neoplasm of unspecified kidney, except renal pelvis: Secondary | ICD-10-CM | POA: Diagnosis not present

## 2015-11-15 DIAGNOSIS — K219 Gastro-esophageal reflux disease without esophagitis: Secondary | ICD-10-CM | POA: Diagnosis not present

## 2015-11-15 DIAGNOSIS — Z23 Encounter for immunization: Secondary | ICD-10-CM | POA: Diagnosis not present

## 2015-11-15 LAB — CBC WITH DIFFERENTIAL/PLATELET
BASOS ABS: 0 10*3/uL (ref 0.0–0.1)
BASOS PCT: 0 %
EOS PCT: 2 %
Eosinophils Absolute: 0.2 10*3/uL (ref 0.0–0.7)
HCT: 39.4 % (ref 36.0–46.0)
Hemoglobin: 12.8 g/dL (ref 12.0–15.0)
Lymphocytes Relative: 12 %
Lymphs Abs: 1.4 10*3/uL (ref 0.7–4.0)
MCH: 31.8 pg (ref 26.0–34.0)
MCHC: 32.5 g/dL (ref 30.0–36.0)
MCV: 98 fL (ref 78.0–100.0)
MONO ABS: 0.4 10*3/uL (ref 0.1–1.0)
MONOS PCT: 3 %
Neutro Abs: 9.5 10*3/uL — ABNORMAL HIGH (ref 1.7–7.7)
Neutrophils Relative %: 83 %
PLATELETS: 258 10*3/uL (ref 150–400)
RBC: 4.02 MIL/uL (ref 3.87–5.11)
RDW: 15.1 % (ref 11.5–15.5)
WBC: 11.5 10*3/uL — ABNORMAL HIGH (ref 4.0–10.5)

## 2015-11-15 LAB — COMPREHENSIVE METABOLIC PANEL
ALT: 16 U/L (ref 14–54)
ANION GAP: 6 (ref 5–15)
AST: 18 U/L (ref 15–41)
Albumin: 3.1 g/dL — ABNORMAL LOW (ref 3.5–5.0)
Alkaline Phosphatase: 109 U/L (ref 38–126)
BUN: 36 mg/dL — AB (ref 6–20)
CHLORIDE: 113 mmol/L — AB (ref 101–111)
CO2: 17 mmol/L — ABNORMAL LOW (ref 22–32)
Calcium: 8.8 mg/dL — ABNORMAL LOW (ref 8.9–10.3)
Creatinine, Ser: 1.85 mg/dL — ABNORMAL HIGH (ref 0.44–1.00)
GFR calc Af Amer: 32 mL/min — ABNORMAL LOW (ref >60)
GFR, EST NON AFRICAN AMERICAN: 27 mL/min — AB (ref >60)
Glucose, Bld: 191 mg/dL — ABNORMAL HIGH (ref 65–99)
POTASSIUM: 3.5 mmol/L (ref 3.5–5.1)
Sodium: 136 mmol/L (ref 135–145)
Total Bilirubin: 0.4 mg/dL (ref 0.3–1.2)
Total Protein: 6.8 g/dL (ref 6.5–8.1)

## 2015-11-15 MED ORDER — HEPARIN SOD (PORK) LOCK FLUSH 100 UNIT/ML IV SOLN
500.0000 [IU] | Freq: Once | INTRAVENOUS | Status: AC | PRN
  Administered 2015-11-15: 500 [IU]

## 2015-11-15 MED ORDER — SODIUM CHLORIDE 0.9 % IJ SOLN
10.0000 mL | INTRAMUSCULAR | Status: DC | PRN
  Administered 2015-11-15: 10 mL
  Filled 2015-11-15: qty 10

## 2015-11-15 MED ORDER — SODIUM CHLORIDE 0.9 % IV SOLN
Freq: Once | INTRAVENOUS | Status: AC
  Administered 2015-11-15: 13:00:00 via INTRAVENOUS

## 2015-11-15 MED ORDER — SODIUM CHLORIDE 0.9 % IV SOLN
240.0000 mg | Freq: Once | INTRAVENOUS | Status: AC
  Administered 2015-11-15: 240 mg via INTRAVENOUS
  Filled 2015-11-15: qty 20

## 2015-11-15 MED ORDER — INFLUENZA VAC SPLIT QUAD 0.5 ML IM SUSY
0.5000 mL | PREFILLED_SYRINGE | Freq: Once | INTRAMUSCULAR | Status: AC
  Administered 2015-11-15: 0.5 mL via INTRAMUSCULAR
  Filled 2015-11-15: qty 0.5

## 2015-11-15 NOTE — Telephone Encounter (Signed)
I have called [facility] to update Dr. Vito Berger about [name] [name]'s case.  His nurse navigator will pass along the message.  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  She was referred to [facility] interventional pulmonology for stent placement and from what I can gather from [facility], she had bronchial stents placed on 11/04/2015.    I have let the Nurse Navigator know that we will be restaging the patient with imaging and changing therapy to Cabometyx.   Additionally, at the patient's request, she wishes to cancel her follow-up appointment at [facility] and she will call to reschedule that appointment.  [name] Payne, PA-C 11/15/2015 2:01 PM

## 2015-11-15 NOTE — Progress Notes (Signed)
Opdivo given today per orders. Patient tolerated well. No problems. Dr.Penland spoke with patient and family today. Vitals stable, discharged via wheelchair with Husband.follow up as scheduled.

## 2015-11-15 NOTE — Patient Instructions (Signed)
[facility] Discharge Instructions for Patients Receiving Chemotherapy   Beginning January 23rd 2017 lab work for the Cancer Center will be done in the  Main lab at [place] on 1st floor. If you have a lab appointment with the Cancer Center please come in thru the  Main Entrance and check in at the main information desk   Today you received the following chemotherapy agents Opdivo  To help prevent nausea and vomiting after your treatment, we encourage you to take your nausea medication   If you develop nausea and vomiting, or diarrhea that is not controlled by your medication, call the clinic.  The clinic phone number is [phone]. Office hours are Monday-Friday 8:30am-5:00pm.  BELOW ARE SYMPTOMS THAT SHOULD BE REPORTED IMMEDIATELY:  *FEVER GREATER THAN 101.0 F  *CHILLS WITH OR WITHOUT FEVER  NAUSEA AND VOMITING THAT IS NOT CONTROLLED WITH YOUR NAUSEA MEDICATION  *UNUSUAL SHORTNESS OF BREATH  *UNUSUAL BRUISING OR BLEEDING  TENDERNESS IN MOUTH AND THROAT WITH OR WITHOUT PRESENCE OF ULCERS  *URINARY PROBLEMS  *BOWEL PROBLEMS  UNUSUAL RASH Items with * indicate a potential emergency and should be followed up as soon as possible. If you have an emergency after office hours please contact your primary care physician or go to the nearest emergency department.  Please call the clinic during office hours if you have any questions or concerns.   You may also contact the Patient Navigator at [phone] should you have any questions or need assistance in obtaining follow up care.      Resources For Cancer Patients and their Caregivers ? American [employer] Society: Can assist with transportation, wigs, general needs, runs Look Good Feel Better.        1-888-227-6333 ? Cancer Care: Provides financial assistance, online support groups, medication/co-pay assistance.  1-800-813-HOPE (4673) ? [facility] Assists [place] cancer  patients and their families through emotional , educational and financial support.  336-427-4357 ? [place] DSS Where to apply for food stamps, Medicaid and utility assistance. 336-342-1394 ? [facility]: Transportation to medical appointments. 336-347-2287 ? Social Security Administration: May apply for disability if have a Stage IV cancer. 336-342-7796 1-800-772-1213 ? [place] [facility] Transit [facility]: Assists with nutrition, care and transit needs. 336-349-2343          

## 2015-11-18 ENCOUNTER — Telehealth (HOSPITAL_COMMUNITY): Payer: Self-pay | Admitting: *Deleted

## 2015-11-18 ENCOUNTER — Other Ambulatory Visit (HOSPITAL_COMMUNITY): Payer: Self-pay | Admitting: Oncology

## 2015-11-18 DIAGNOSIS — C641 Malignant neoplasm of right kidney, except renal pelvis: Secondary | ICD-10-CM

## 2015-11-18 DIAGNOSIS — C799 Secondary malignant neoplasm of unspecified site: Principal | ICD-10-CM

## 2015-11-22 ENCOUNTER — Encounter (HOSPITAL_COMMUNITY): Payer: Self-pay | Admitting: Psychiatry

## 2015-11-22 ENCOUNTER — Ambulatory Visit (INDEPENDENT_AMBULATORY_CARE_PROVIDER_SITE_OTHER): Payer: PPO | Admitting: Psychiatry

## 2015-11-22 DIAGNOSIS — F418 Other specified anxiety disorders: Secondary | ICD-10-CM | POA: Diagnosis not present

## 2015-11-22 NOTE — Progress Notes (Signed)
      THERAPIST PROGRESS NOTE  Session Time:       Friday 11/22/2015 10:51 AM - 11:38 AM     Participation Level: Active  Behavioral Response: CasualAlert/anxious/tearful at times    Type of Therapy: Individual Therapy  Treatment Goals:    1.  Verbalize an increased understanding of the steps to grieving the loss is brought on by her medical condition.      2. Identify feelings associated with medical condition.      3. Learn and implement skills for managing stress.    Treatment Goals addressed: 1,2.3   Interventions: ACT, Supportive  Summary: [name] [name] is a 65 y.o. female who presents with symptoms of depression and anxiety that have been intermittent for several years that have been well controlled until patient was diagnosed with cancer in 2013. Symptoms have worsened in recent months as     patient reports constantly thinking about her diagnoses and fears cancer will spread. She has been given a prognosis of 2-4 years to live. Patient reports taking chemotherapy pills and reports extreme fatigue. Patient also experiences depression, anxiety, and crying spells  Patient last was seen about 6 weeks ago. She reports having an emergency procedure at [facility] to place a stent after patient experienced a long period of severe coughing. She continues to experience coughing and fears this means cancer is progressing. She reports increased tearfulness and  worry about other issues and reports multiple stressors. She reports tension in marriage, financial concerns, and worries about children and grandchildren.  Suicidal/Homicidal: No    Therapist Response: reviewed symptoms, facilitated patient's expression of feelings, discussed ways to intervene in spiraling thoughts and encouraged patient to talk with providers about her questions regarding her health, begin to discuss stages of grief,    Plan: Patient agrees to return for an appointment in 3-4 week and implement  strategies discussed in session.    Diagnosis: Axis I: Depressive Disorder NOS    Axis II: Deferred    Caysie Minnifield, LCSW 11/22/2015

## 2015-11-23 ENCOUNTER — Other Ambulatory Visit: Payer: Self-pay | Admitting: Physician Assistant

## 2015-11-23 ENCOUNTER — Other Ambulatory Visit (HOSPITAL_COMMUNITY): Payer: Self-pay | Admitting: Hematology & Oncology

## 2015-11-25 ENCOUNTER — Telehealth (HOSPITAL_COMMUNITY): Payer: Self-pay | Admitting: *Deleted

## 2015-11-25 NOTE — Telephone Encounter (Signed)
Rx refilled per protocol 

## 2015-11-26 ENCOUNTER — Telehealth: Payer: Self-pay | Admitting: Family Medicine

## 2015-11-26 NOTE — Telephone Encounter (Signed)
Rec'd [facility] auth # [id number] for CT abd/Pelvis and CT Thorax for 1 visits from 11/27/15 - 05/25/16 Dx C64.1 and C79.9  oredered by Dr Whitney Muse (oncologist) at [facility]

## 2015-11-27 ENCOUNTER — Ambulatory Visit (HOSPITAL_COMMUNITY)
Admission: RE | Admit: 2015-11-27 | Discharge: 2015-11-27 | Disposition: A | Discharge status: Home / Self Care | Payer: PPO | Source: Ambulatory Visit | Attending: Oncology | Admitting: Oncology

## 2015-11-27 DIAGNOSIS — I700 Atherosclerosis of aorta: Secondary | ICD-10-CM | POA: Insufficient documentation

## 2015-11-27 DIAGNOSIS — C641 Malignant neoplasm of right kidney, except renal pelvis: Secondary | ICD-10-CM | POA: Diagnosis not present

## 2015-11-27 DIAGNOSIS — I77819 Aortic ectasia, unspecified site: Secondary | ICD-10-CM | POA: Diagnosis not present

## 2015-11-27 DIAGNOSIS — C7800 Secondary malignant neoplasm of unspecified lung: Secondary | ICD-10-CM | POA: Diagnosis not present

## 2015-11-27 DIAGNOSIS — I313 Pericardial effusion (noninflammatory): Secondary | ICD-10-CM | POA: Insufficient documentation

## 2015-11-27 DIAGNOSIS — I2510 Atherosclerotic heart disease of native coronary artery without angina pectoris: Secondary | ICD-10-CM | POA: Insufficient documentation

## 2015-11-27 DIAGNOSIS — Z905 Acquired absence of kidney: Secondary | ICD-10-CM | POA: Insufficient documentation

## 2015-11-27 DIAGNOSIS — J90 Pleural effusion, not elsewhere classified: Secondary | ICD-10-CM | POA: Diagnosis not present

## 2015-11-27 DIAGNOSIS — C799 Secondary malignant neoplasm of unspecified site: Secondary | ICD-10-CM

## 2015-11-29 ENCOUNTER — Encounter (HOSPITAL_BASED_OUTPATIENT_CLINIC_OR_DEPARTMENT_OTHER): Payer: PPO | Admitting: Hematology & Oncology

## 2015-11-29 ENCOUNTER — Encounter: Payer: Self-pay | Admitting: Dietician

## 2015-11-29 ENCOUNTER — Encounter (HOSPITAL_BASED_OUTPATIENT_CLINIC_OR_DEPARTMENT_OTHER): Payer: PPO

## 2015-11-29 DIAGNOSIS — E039 Hypothyroidism, unspecified: Secondary | ICD-10-CM

## 2015-11-29 DIAGNOSIS — C641 Malignant neoplasm of right kidney, except renal pelvis: Secondary | ICD-10-CM

## 2015-11-29 DIAGNOSIS — C779 Secondary and unspecified malignant neoplasm of lymph node, unspecified: Secondary | ICD-10-CM | POA: Diagnosis not present

## 2015-11-29 DIAGNOSIS — R062 Wheezing: Secondary | ICD-10-CM

## 2015-11-29 DIAGNOSIS — C7800 Secondary malignant neoplasm of unspecified lung: Secondary | ICD-10-CM

## 2015-11-29 DIAGNOSIS — C649 Malignant neoplasm of unspecified kidney, except renal pelvis: Secondary | ICD-10-CM

## 2015-11-29 DIAGNOSIS — F329 Major depressive disorder, single episode, unspecified: Secondary | ICD-10-CM

## 2015-11-29 DIAGNOSIS — E032 Hypothyroidism due to medicaments and other exogenous substances: Secondary | ICD-10-CM

## 2015-11-29 DIAGNOSIS — R0602 Shortness of breath: Secondary | ICD-10-CM

## 2015-11-29 DIAGNOSIS — R944 Abnormal results of kidney function studies: Secondary | ICD-10-CM

## 2015-11-29 LAB — COMPREHENSIVE METABOLIC PANEL WITH GFR
ALT: 14 U/L (ref 14–54)
AST: 19 U/L (ref 15–41)
Albumin: 3.4 g/dL — ABNORMAL LOW (ref 3.5–5.0)
Alkaline Phosphatase: 84 U/L (ref 38–126)
Anion gap: 8 (ref 5–15)
BUN: 20 mg/dL (ref 6–20)
CO2: 20 mmol/L — ABNORMAL LOW (ref 22–32)
Calcium: 9.2 mg/dL (ref 8.9–10.3)
Chloride: 109 mmol/L (ref 101–111)
Creatinine, Ser: 1.56 mg/dL — ABNORMAL HIGH (ref 0.44–1.00)
GFR calc Af Amer: 39 mL/min — ABNORMAL LOW
GFR calc non Af Amer: 34 mL/min — ABNORMAL LOW
Glucose, Bld: 187 mg/dL — ABNORMAL HIGH (ref 65–99)
Potassium: 3.8 mmol/L (ref 3.5–5.1)
Sodium: 137 mmol/L (ref 135–145)
Total Bilirubin: 0.5 mg/dL (ref 0.3–1.2)
Total Protein: 7.7 g/dL (ref 6.5–8.1)

## 2015-11-29 LAB — TSH: TSH: 3.008 u[IU]/mL (ref 0.350–4.500)

## 2015-11-29 LAB — CBC WITH DIFFERENTIAL/PLATELET
Basophils Absolute: 0 K/uL (ref 0.0–0.1)
Basophils Relative: 0 %
Eosinophils Absolute: 0.5 K/uL (ref 0.0–0.7)
Eosinophils Relative: 4 %
HCT: 37.6 % (ref 36.0–46.0)
Hemoglobin: 12 g/dL (ref 12.0–15.0)
Lymphocytes Relative: 11 %
Lymphs Abs: 1.2 K/uL (ref 0.7–4.0)
MCH: 32.5 pg (ref 26.0–34.0)
MCHC: 31.9 g/dL (ref 30.0–36.0)
MCV: 101.9 fL — ABNORMAL HIGH (ref 78.0–100.0)
Monocytes Absolute: 0.7 K/uL (ref 0.1–1.0)
Monocytes Relative: 6 %
Neutro Abs: 9.2 K/uL — ABNORMAL HIGH (ref 1.7–7.7)
Neutrophils Relative %: 79 %
Platelets: 301 K/uL (ref 150–400)
RBC: 3.69 MIL/uL — ABNORMAL LOW (ref 3.87–5.11)
RDW: 15 % (ref 11.5–15.5)
WBC: 11.7 K/uL — ABNORMAL HIGH (ref 4.0–10.5)

## 2015-11-29 MED ORDER — HEPARIN SOD (PORK) LOCK FLUSH 100 UNIT/ML IV SOLN
500.0000 [IU] | Freq: Once | INTRAVENOUS | Status: AC
  Administered 2015-11-29: 500 [IU] via INTRAVENOUS

## 2015-11-29 MED ORDER — PREDNISONE 20 MG PO TABS: ORAL_TABLET | ORAL | 1 refills | Status: DC

## 2015-11-29 MED ORDER — SODIUM CHLORIDE 0.9% FLUSH
10.0000 mL | INTRAVENOUS | Status: DC | PRN
  Administered 2015-11-29: 10 mL via INTRAVENOUS
  Filled 2015-11-29: qty 10

## 2015-11-29 NOTE — Progress Notes (Signed)
[name] [name] tolerated port lab draw well without complaints or incident. Chemotherapy tx held today due to pt's increased SOB and cough per MD. Port flushed per protocol. Pt discharged via wheelchair in stable condition with family members after MD instructions given

## 2015-11-29 NOTE — Progress Notes (Signed)
Patient identified to be at risk for malnutrition on the MST secondary to poor appetite and weight loss  Contacted Pt by Visiting during her infusion  Wt Readings from Last 10 Encounters:  11/29/15 166 lb 6.4 oz (75.5 kg)  11/15/15 164 lb 3.2 oz (74.5 kg)  10/31/15 172 lb 9.6 oz (78.3 kg)  10/18/15 170 lb 9.6 oz (77.4 kg)  10/04/15 177 lb (80.3 kg)  09/20/15 173 lb 3.2 oz (78.6 kg)  09/06/15 174 lb (78.9 kg)  08/23/15 175 lb 12.8 oz (79.7 kg)  08/09/15 174 lb 1.6 oz (79 kg)  07/26/15 174 lb 9.6 oz (79.2 kg)   Patient weight has Decreased by ~10 lbs in the last couple months.   Patient reports oral intake as fair. It has decreased secondary to her main complaint of persistent cough.   Pt says she coughs essentially all day long. She has only found one position in which she can lay down that results in the cough not being triggered. Drinking or eating also results in coughing. This began early to mid august which roughly correlates with her weight loss.  Prior to the development of the cough, she would eat "atleast 2 meals" a day. She had a good appetite and would eat full meals. Now she reports she has become "more picky" with her food choices. She also is much more sedentary; she stays in bed until 10-11 am.   RD went through dietary recall. Breakfast is almost always cold cereal with 2% milk. She has this around 11 am. Her dinner is whatever her husband makes. She notes if it an item she is hungry for, she will eat a full meal. She cites  an example where her and her husband went to a restaurant and she ate a steak. She sips on water throughout the day.   RD provided weight maintenance education. Because pt's appetite is poorer, patient needs to prioritize calorie/protein dense foods. RD went through dietary recall and either gave approval or detailed how to increase kcal/pro content of pts meals or the food items reported. Specifically, RD asked her to switch her 2% milk to whole milk.  Also recommended that she start sipping on calorie containing beverages.   She has never had Ensure/Boost or other nutritional supplements. She was willing to try them; "Which one tastes the best?".   RD provided samples of each flavor and consistency. Explained the differences between Clear supplements vs regular and when one might be more appropriate than the other.   RD further provided coupons so she could go on her own and try other flavors/supplements.    Explained the Ensure Assistance program. RD provided contact information to call if she would like to pursue this.   Pt was receptive to all recommendations. Her primary problem is actively being followed up on by MD/PA. Hopefully her cough can be addressed so she can return to having good PO intake.   Left my contact info, coupons, samples, and handouts titled "Increasing calories and Protein".   Burtis Junes RD, LDN, [place] [facility] Pager: [phone] 11/29/2015 10:49 AM
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
## 2015-11-29 NOTE — Patient Instructions (Addendum)
[place] at [facility] Discharge Instructions  RECOMMENDATIONS MADE BY THE CONSULTANT AND ANY TEST RESULTS WILL BE SENT TO YOUR REFERRING PHYSICIAN.  Exam with Dr. Tiffanye Hartmann Muse today. You have a prescription for prednisone at the pharmacy, please pick this up and take as directed.   Return as scheduled in two weeks for follow up as well as chemotherapy.    Thank you for choosing [place] at [facility] to provide your oncology and hematology care.  To afford each patient quality time with our provider, please arrive at least 15 minutes before your scheduled appointment time.   Beginning January 23rd 2017 lab work for the [facility] will be done in the  Main lab at [facility] on 1st floor. If you have a lab appointment with the [facility] please come in thru the  Main Entrance and check in at the main information desk  You need to re-schedule your appointment should you arrive 10 or more minutes late.  We strive to give you quality time with our providers, and arriving late affects you and other patients whose appointments are after yours.  Also, if you no show three or more times for appointments you may be dismissed from the clinic at the providers discretion.     Again, thank you for choosing [facility].  Our hope is that these requests will decrease the amount of time that you wait before being seen by our physicians.       _____________________________________________________________  Should you have questions after your visit to [facility], please contact our office at [phone] between the hours of 8:30 a.m. and 4:30 p.m.  Voicemails left after 4:30 p.m. will not be returned until the following business day.  For prescription refill requests, have your pharmacy contact our office.         Resources For Cancer Patients and their Caregivers ? American Cancer Society: Can assist with transportation,  wigs, general needs, runs Look Good Feel Better.        909-370-1143 ? Cancer Care: Provides financial assistance, online support groups, medication/co-pay assistance.  1-800-813-HOPE (423) 855-6851) ? [place] Assists [place] cancer patients and their families through emotional , educational and financial support.  (316) 816-7649 ? [place] DSS Where to apply for food stamps, Medicaid and utility assistance. 920-327-0596 ? [facility]: Transportation to medical appointments. (562)472-0103 ? Social Security Administration: May apply for disability if have a Stage IV cancer. 816 286 2177 2522117618 ? [employer], Disability and Transit Services: Assists with nutrition, care and transit needs. [place] Support Programs: '@10RELATIVEDAYS'$ @ > Cancer Support Group  2nd Tuesday of the month 1pm-2pm, Journey Room  > Creative Journey  3rd Tuesday of the month 1130am-1pm, Journey Room  > Look Good Feel Better  1st Wednesday of the month 10am-12 noon, Journey Room (Call [place] to register [phone])

## 2015-11-29 NOTE — Patient Instructions (Signed)
[place] at [facility] Discharge Instructions  RECOMMENDATIONS MADE BY THE CONSULTANT AND ANY TEST RESULTS WILL BE SENT TO YOUR REFERRING PHYSICIAN.  Labs drawn from port and port flushed per protocol today. Chemo treatment held today per MD.Follow-up as scheduled. Call clinic for any questions or concerns  Thank you for choosing [place] at [facility] to provide your oncology and hematology care.  To afford each patient quality time with our provider, please arrive at least 15 minutes before your scheduled appointment time.   Beginning January 23rd 2017 lab work for the [facility] will be done in the  Main lab at [facility] on 1st floor. If you have a lab appointment with the [facility] please come in thru the  Main Entrance and check in at the main information desk  You need to re-schedule your appointment should you arrive 10 or more minutes late.  We strive to give you quality time with our providers, and arriving late affects you and other patients whose appointments are after yours.  Also, if you no show three or more times for appointments you may be dismissed from the clinic at the providers discretion.     Again, thank you for choosing [facility].  Our hope is that these requests will decrease the amount of time that you wait before being seen by our physicians.       _____________________________________________________________  Should you have questions after your visit to [facility], please contact our office at [phone] between the hours of 8:30 a.m. and 4:30 p.m.  Voicemails left after 4:30 p.m. will not be returned until the following business day.  For prescription refill requests, have your pharmacy contact our office.         Resources For Cancer Patients and their Caregivers ? American Cancer Society: Can assist with transportation, wigs, general needs, runs Look Good Feel Better.         914 766 6637 ? Cancer Care: Provides financial assistance, online support groups, medication/co-pay assistance.  1-800-813-HOPE 939 781 9436) ? [place] Assists [place] cancer patients and their families through emotional , educational and financial support.  930-389-7326 ? [place] DSS Where to apply for food stamps, Medicaid and utility assistance. 681 330 2155 ? [employer]: Transportation to medical appointments. (734)179-4217 ? Social Security Administration: May apply for disability if have a Stage IV cancer. 435-836-7691 209-273-5558 ? [employer], Disability and Transit Services: Assists with nutrition, care and transit needs. [place] Support Programs: '@10RELATIVEDAYS'$ @ > Cancer Support Group  2nd Tuesday of the month 1pm-2pm, Journey Room  > Creative Journey  3rd Tuesday of the month 1130am-1pm, Journey Room  > Look Good Feel Better  1st Wednesday of the month 10am-12 noon, Journey Room (Call [place] to register [phone])

## 2015-12-03 DIAGNOSIS — M170 Bilateral primary osteoarthritis of knee: Secondary | ICD-10-CM | POA: Diagnosis not present

## 2015-12-03 DIAGNOSIS — M1712 Unilateral primary osteoarthritis, left knee: Secondary | ICD-10-CM | POA: Diagnosis not present

## 2015-12-03 DIAGNOSIS — M25562 Pain in left knee: Secondary | ICD-10-CM | POA: Diagnosis not present

## 2015-12-03 DIAGNOSIS — M25561 Pain in right knee: Secondary | ICD-10-CM | POA: Diagnosis not present

## 2015-12-11 DIAGNOSIS — C649 Malignant neoplasm of unspecified kidney, except renal pelvis: Secondary | ICD-10-CM | POA: Diagnosis not present

## 2015-12-11 DIAGNOSIS — R05 Cough: Secondary | ICD-10-CM | POA: Diagnosis not present

## 2015-12-12 ENCOUNTER — Encounter (HOSPITAL_COMMUNITY): Payer: PPO | Attending: Oncology | Admitting: Oncology

## 2015-12-12 ENCOUNTER — Encounter (HOSPITAL_COMMUNITY): Payer: PPO

## 2015-12-12 DIAGNOSIS — C799 Secondary malignant neoplasm of unspecified site: Principal | ICD-10-CM

## 2015-12-12 DIAGNOSIS — C641 Malignant neoplasm of right kidney, except renal pelvis: Secondary | ICD-10-CM

## 2015-12-12 DIAGNOSIS — C7800 Secondary malignant neoplasm of unspecified lung: Secondary | ICD-10-CM | POA: Diagnosis not present

## 2015-12-12 DIAGNOSIS — R0602 Shortness of breath: Secondary | ICD-10-CM

## 2015-12-12 NOTE — Patient Instructions (Addendum)
[place] at [facility] Discharge Instructions  RECOMMENDATIONS MADE BY THE CONSULTANT AND ANY TEST RESULTS WILL BE SENT TO YOUR REFERRING PHYSICIAN.  Return to see [place] in 2 weeks for follow up   You will see Dr. Vito Berger on Monday    Thank you for choosing [place] at [facility] to provide your oncology and hematology care.  To afford each patient quality time with our provider, please arrive at least 15 minutes before your scheduled appointment time.   Beginning January 23rd 2017 lab work for the [facility] will be done in the  Main lab at [facility] on 1st floor. If you have a lab appointment with the [facility] please come in thru the  Main Entrance and check in at the main information desk  You need to re-schedule your appointment should you arrive 10 or more minutes late.  We strive to give you quality time with our providers, and arriving late affects you and other patients whose appointments are after yours.  Also, if you no show three or more times for appointments you may be dismissed from the clinic at the providers discretion.     Again, thank you for choosing [facility].  Our hope is that these requests will decrease the amount of time that you wait before being seen by our physicians.       _____________________________________________________________  Should you have questions after your visit to [facility], please contact our office at [phone] between the hours of 8:30 a.m. and 4:30 p.m.  Voicemails left after 4:30 p.m. will not be returned until the following business day.  For prescription refill requests, have your pharmacy contact our office.         Resources For Cancer Patients and their Caregivers ? American Cancer Society: Can assist with transportation, wigs, general needs, runs Look Good Feel Better.        501-602-9511 ? Cancer Care: Provides financial assistance,  online support groups, medication/co-pay assistance.  1-800-813-HOPE (623) 364-4628) ? [place] Assists [place] cancer patients and their families through emotional , educational and financial support.  510-321-7376 ? [place] DSS Where to apply for food stamps, Medicaid and utility assistance. (431)790-8372 ? [employer]: Transportation to medical appointments. 512-113-4134 ? Social Security Administration: May apply for disability if have a Stage IV cancer. (475) 486-9987 (419)777-5437 ? [employer], Disability and Transit Services: Assists with nutrition, care and transit needs. [place] Support Programs: '@10RELATIVEDAYS'$ @ > Cancer Support Group  2nd Tuesday of the month 1pm-2pm, Journey Room  > Creative Journey  3rd Tuesday of the month 1130am-1pm, Journey Room  > Look Good Feel Better  1st Wednesday of the month 10am-12 noon, Journey Room (Call [place] to register [phone])

## 2015-12-12 NOTE — Assessment & Plan Note (Addendum)
Stage IV RCC, on single agent Nivolumab, managed by [facility], Dr. Vito Berger.  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  Oncology history is updated.    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  She is currently on 60 mg and she reports 1-2 more days worth of this dose until her next step-down with the taper.    No treatment today.  She has follow-up with dr. Vito Berger on Monday, 12/4, at [facility].  She is trying to get in contact with [facility] pulmonology for her stent replacement.  She thinks she may be scheduled tomorrow.  She will return in 2 weeks for follow-up, sooner if needed based upon Dr. Kara Dies recommendation(s).
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
## 2015-12-13 ENCOUNTER — Inpatient Hospital Stay (HOSPITAL_COMMUNITY): Payer: Self-pay

## 2015-12-13 ENCOUNTER — Ambulatory Visit (HOSPITAL_COMMUNITY): Payer: Self-pay | Admitting: Oncology

## 2015-12-13 DIAGNOSIS — F329 Major depressive disorder, single episode, unspecified: Secondary | ICD-10-CM | POA: Diagnosis not present

## 2015-12-13 DIAGNOSIS — Z955 Presence of coronary angioplasty implant and graft: Secondary | ICD-10-CM | POA: Diagnosis not present

## 2015-12-13 DIAGNOSIS — K219 Gastro-esophageal reflux disease without esophagitis: Secondary | ICD-10-CM | POA: Diagnosis not present

## 2015-12-13 DIAGNOSIS — E119 Type 2 diabetes mellitus without complications: Secondary | ICD-10-CM | POA: Diagnosis not present

## 2015-12-13 DIAGNOSIS — Z8553 Personal history of malignant neoplasm of renal pelvis: Secondary | ICD-10-CM | POA: Diagnosis not present

## 2015-12-13 DIAGNOSIS — J9809 Other diseases of bronchus, not elsewhere classified: Secondary | ICD-10-CM | POA: Diagnosis not present

## 2015-12-13 DIAGNOSIS — R918 Other nonspecific abnormal finding of lung field: Secondary | ICD-10-CM | POA: Diagnosis not present

## 2015-12-13 DIAGNOSIS — Z79899 Other long term (current) drug therapy: Secondary | ICD-10-CM | POA: Diagnosis not present

## 2015-12-13 DIAGNOSIS — Z905 Acquired absence of kidney: Secondary | ICD-10-CM | POA: Diagnosis not present

## 2015-12-13 DIAGNOSIS — Z85118 Personal history of other malignant neoplasm of bronchus and lung: Secondary | ICD-10-CM | POA: Diagnosis not present

## 2015-12-13 DIAGNOSIS — I10 Essential (primary) hypertension: Secondary | ICD-10-CM | POA: Diagnosis not present

## 2015-12-17 ENCOUNTER — Encounter (HOSPITAL_COMMUNITY): Payer: Self-pay | Admitting: Psychiatry

## 2015-12-17 ENCOUNTER — Ambulatory Visit (INDEPENDENT_AMBULATORY_CARE_PROVIDER_SITE_OTHER): Payer: PPO | Admitting: Psychiatry

## 2015-12-17 VITALS — BP 150/88 | HR 84 | Ht 65.0 in | Wt 169.4 lb

## 2015-12-17 DIAGNOSIS — Z888 Allergy status to other drugs, medicaments and biological substances status: Secondary | ICD-10-CM | POA: Diagnosis not present

## 2015-12-17 DIAGNOSIS — F418 Other specified anxiety disorders: Secondary | ICD-10-CM

## 2015-12-17 DIAGNOSIS — Z79899 Other long term (current) drug therapy: Secondary | ICD-10-CM

## 2015-12-17 DIAGNOSIS — Z833 Family history of diabetes mellitus: Secondary | ICD-10-CM | POA: Diagnosis not present

## 2015-12-17 DIAGNOSIS — Z8249 Family history of ischemic heart disease and other diseases of the circulatory system: Secondary | ICD-10-CM

## 2015-12-17 DIAGNOSIS — Z808 Family history of malignant neoplasm of other organs or systems: Secondary | ICD-10-CM

## 2015-12-17 MED ORDER — ZOLPIDEM TARTRATE 5 MG PO TABS: 5.0000 mg | ORAL_TABLET | Freq: Every evening | ORAL | 2 refills | Status: AC | PRN

## 2015-12-17 MED ORDER — DIAZEPAM 10 MG PO TABS: 10.0000 mg | ORAL_TABLET | Freq: Four times a day (QID) | ORAL | 2 refills | Status: AC

## 2015-12-17 MED ORDER — PAROXETINE HCL ER 37.5 MG PO TB24: 37.5000 mg | ORAL_TABLET | Freq: Every day | ORAL | 2 refills | Status: AC
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
## 2015-12-18 ENCOUNTER — Encounter (HOSPITAL_COMMUNITY): Payer: Self-pay | Admitting: Psychiatry

## 2015-12-18 ENCOUNTER — Ambulatory Visit (INDEPENDENT_AMBULATORY_CARE_PROVIDER_SITE_OTHER): Payer: PPO | Admitting: Psychiatry

## 2015-12-18 DIAGNOSIS — F418 Other specified anxiety disorders: Secondary | ICD-10-CM
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
## 2015-12-19 DIAGNOSIS — Z9889 Other specified postprocedural states: Secondary | ICD-10-CM | POA: Diagnosis not present

## 2015-12-19 DIAGNOSIS — I10 Essential (primary) hypertension: Secondary | ICD-10-CM | POA: Diagnosis not present

## 2015-12-19 DIAGNOSIS — J984 Other disorders of lung: Secondary | ICD-10-CM | POA: Diagnosis not present

## 2015-12-19 DIAGNOSIS — C649 Malignant neoplasm of unspecified kidney, except renal pelvis: Secondary | ICD-10-CM | POA: Diagnosis not present

## 2015-12-19 DIAGNOSIS — I82221 Chronic embolism and thrombosis of inferior vena cava: Secondary | ICD-10-CM | POA: Diagnosis not present

## 2015-12-19 DIAGNOSIS — Z6827 Body mass index (BMI) 27.0-27.9, adult: Secondary | ICD-10-CM | POA: Diagnosis not present

## 2015-12-19 DIAGNOSIS — I2510 Atherosclerotic heart disease of native coronary artery without angina pectoris: Secondary | ICD-10-CM | POA: Diagnosis not present

## 2015-12-19 DIAGNOSIS — E119 Type 2 diabetes mellitus without complications: Secondary | ICD-10-CM | POA: Diagnosis not present

## 2015-12-19 DIAGNOSIS — Z79899 Other long term (current) drug therapy: Secondary | ICD-10-CM | POA: Diagnosis not present

## 2015-12-19 DIAGNOSIS — E041 Nontoxic single thyroid nodule: Secondary | ICD-10-CM | POA: Diagnosis not present

## 2015-12-19 DIAGNOSIS — F329 Major depressive disorder, single episode, unspecified: Secondary | ICD-10-CM | POA: Diagnosis not present

## 2015-12-19 DIAGNOSIS — E785 Hyperlipidemia, unspecified: Secondary | ICD-10-CM | POA: Diagnosis not present

## 2015-12-19 DIAGNOSIS — I700 Atherosclerosis of aorta: Secondary | ICD-10-CM | POA: Diagnosis not present

## 2015-12-19 DIAGNOSIS — Z905 Acquired absence of kidney: Secondary | ICD-10-CM | POA: Diagnosis not present

## 2015-12-19 DIAGNOSIS — C7800 Secondary malignant neoplasm of unspecified lung: Secondary | ICD-10-CM | POA: Diagnosis not present

## 2015-12-19 DIAGNOSIS — Z8759 Personal history of other complications of pregnancy, childbirth and the puerperium: Secondary | ICD-10-CM | POA: Diagnosis not present

## 2015-12-19 DIAGNOSIS — J9809 Other diseases of bronchus, not elsewhere classified: Secondary | ICD-10-CM | POA: Diagnosis not present

## 2015-12-20 ENCOUNTER — Telehealth (HOSPITAL_COMMUNITY): Payer: Self-pay | Admitting: Oncology

## 2015-12-20 NOTE — Telephone Encounter (Signed)
Return call from Dr. Virgel Bouquet at [facility] regarding Eugenio Hoes.  No answer.  VM left with my contact number and pager number.  Ashlay Altieri, PA-C 12/20/2015 1:25 PM
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
## 2015-12-23 NOTE — Assessment & Plan Note (Addendum)
Stage IV RCC, on single agent Nivolumab, managed by [facility], Dr. Vito Berger.  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  Ongoing SOB was concerning for Nivolumab-induced pneumonitis.  Patient's case reviewed at [facility] and pulmonology does not feel the patient's ongoing SOB is immunotherapy-induced, but rather a post-obstructive issue.  Oncology history is updated.    She is still on her Prednisone taper.  She is on 10 mg daily.  She will complete this on Friday, then QOD and complete all of her Prednisone on 01/02/2016.  She is scheduled for procedure at [facility] on 12/27/2015.  She is prescribed albuterol by pulmonologist, but she has not yet started this medication.  She will pick this up today.  She was to be set-up at [facility] for baseline re-imaging but the patient did not get this set-up.  We will ask patient to call to get these set-up as our scheduler was unable to since we are not the ordering providers.  I have requested the patient call us when she knows the date of her restaging imaging at [facility].  Once re-imaging is completed, she will start Cabometyx 60 mg PO daily.  Rx is printed for Lendell Caprice to get approved through insurance.  Message is sent to Shellia Carwin, Nurse navigator for chemotherapy teaching.  After starting Cabometyx, she will be scheduled with a follow-up appointment with re-imaging in 8 weeks at [facility].  I will keep Margarita Grizzle, NP apprised (cell#: [phone]).  Return for follow-up ~ 1 week after starting Cabometyx.

## 2015-12-24 ENCOUNTER — Encounter (HOSPITAL_COMMUNITY): Payer: Self-pay | Admitting: Oncology

## 2015-12-24 ENCOUNTER — Telehealth (HOSPITAL_COMMUNITY): Payer: Self-pay | Admitting: Emergency Medicine

## 2015-12-24 ENCOUNTER — Other Ambulatory Visit (HOSPITAL_COMMUNITY): Payer: Self-pay | Admitting: Pharmacist

## 2015-12-24 ENCOUNTER — Encounter (HOSPITAL_COMMUNITY): Payer: PPO | Attending: Hematology and Oncology | Admitting: Oncology

## 2015-12-24 ENCOUNTER — Encounter (HOSPITAL_COMMUNITY): Payer: Self-pay | Admitting: Emergency Medicine

## 2015-12-24 DIAGNOSIS — R0602 Shortness of breath: Secondary | ICD-10-CM | POA: Diagnosis not present

## 2015-12-24 DIAGNOSIS — C7800 Secondary malignant neoplasm of unspecified lung: Secondary | ICD-10-CM

## 2015-12-24 DIAGNOSIS — C641 Malignant neoplasm of right kidney, except renal pelvis: Secondary | ICD-10-CM

## 2015-12-24 DIAGNOSIS — K219 Gastro-esophageal reflux disease without esophagitis: Secondary | ICD-10-CM | POA: Insufficient documentation

## 2015-12-24 DIAGNOSIS — R5383 Other fatigue: Secondary | ICD-10-CM | POA: Insufficient documentation

## 2015-12-24 DIAGNOSIS — R918 Other nonspecific abnormal finding of lung field: Secondary | ICD-10-CM | POA: Insufficient documentation

## 2015-12-24 DIAGNOSIS — C799 Secondary malignant neoplasm of unspecified site: Secondary | ICD-10-CM

## 2015-12-24 DIAGNOSIS — C649 Malignant neoplasm of unspecified kidney, except renal pelvis: Secondary | ICD-10-CM | POA: Insufficient documentation

## 2015-12-24 MED ORDER — CABOZANTINIB S-MALATE 60 MG PO TABS: 60.0000 mg | ORAL_TABLET | Freq: Every day | ORAL | 0 refills | Status: DC

## 2015-12-24 NOTE — Patient Instructions (Signed)
[place] at [facility] Discharge Instructions  RECOMMENDATIONS MADE BY THE CONSULTANT AND ANY TEST RESULTS WILL BE SENT TO YOUR REFERRING PHYSICIAN.  You were seen today by [name] PA-C. Please call [facility] to set up scans, then call us back with the date. Chemo teaching to be scheduled and Shellia Carwin will schedule your next appointment.  Prednisone Taper: 10 mg (1/2 tablet) daily, then 10 mg every other day beginning on Friday the 15th and finishing on the 21st.  Thank you for choosing [place] at [facility] to provide your oncology and hematology care.  To afford each patient quality time with our provider, please arrive at least 15 minutes before your scheduled appointment time.   Beginning January 23rd 2017 lab work for the [facility] will be done in the  Main lab at [facility] on 1st floor. If you have a lab appointment with the [facility] please come in thru the  Main Entrance and check in at the main information desk  You need to re-schedule your appointment should you arrive 10 or more minutes late.  We strive to give you quality time with our providers, and arriving late affects you and other patients whose appointments are after yours.  Also, if you no show three or more times for appointments you may be dismissed from the clinic at the providers discretion.     Again, thank you for choosing [facility]  Our hope is that these requests will decrease the amount of time that you wait before being seen by our physicians.       _____________________________________________________________  Should you have questions after your visit to [facility], please contact our office at [phone] between the hours of 8:30 a.m. and 4:30 p.m.  Voicemails left after 4:30 p.m. will not be returned until the following business day.  For prescription refill requests, have your pharmacy contact our office.          Resources For Cancer Patients and their Caregivers ? American Cancer Society: Can assist with transportation, wigs, general needs, runs Look Good Feel Better.        941-876-0323 ? Cancer Care: Provides financial assistance, online support groups, medication/co-pay assistance.  1-800-813-HOPE 610-811-1289) ? [facility] Assists [place] cancer patients and their families through emotional , educational and financial support.  (913)424-5785 ? [place] DSS Where to apply for food stamps, Medicaid and utility assistance. 959-116-8784 ? [facility]: Transportation to medical appointments. 973-432-2837 ? Social Security Administration: May apply for disability if have a Stage IV cancer. 617 877 0478 613-561-4721 ? [employer], Disability and Transit Services: Assists with nutrition, care and transit needs. [place] Support Programs: '@10RELATIVEDAYS'$ @ > Cancer Support Group  2nd Tuesday of the month 1pm-2pm, Journey Room  > Creative Journey  3rd Tuesday of the month 1130am-1pm, Journey Room  > Look Good Feel Better  1st Wednesday of the month 10am-12 noon, Journey Room (Call [place] to register [phone])

## 2015-12-24 NOTE — Telephone Encounter (Signed)
Husband states that scans at [facility] are 01/02/2016 at 4:30 om.

## 2015-12-24 NOTE — Patient Instructions (Signed)
[place]   CHEMOTHERAPY INSTRUCTIONS  You have Stage 4 Renal Cell Carcinoma.  You had progressed through treatment on Opdivo.  We are switching you to Cabometyx.  You will take 60 mg daily.  This treatment is with palliative intent, which means you are treatable but not curable.   You will continue to take this medication until you can not tolerate it or you progress.  You will see the doctor regularly throughout treatment.  We monitor your lab work prior to every treatment.  The doctor monitors your response to treatment by the way you are feeling, your blood work, and scans periodically.  POTENTIAL SIDE EFFECTS OF TREATMENT:  What Cabozantinib Is Used For:  For the treatment of metastatic medullary thyroid cancer (MTC).  For the treatment of advanced renal cell carcinoma in patients who have received prior anti-angiogenic therapy. Note: If a drug has been approved for one use, physicians may elect to use this same drug for other problems if they believe it may be helpful.   How Cabozantinib Is Given:  Cabozantinib is a pill, taken by mouth.  Take cabozantinib on an empty stomach. Do not eat for at least 2 hours before and at least 1 hour after taking cabozantinib.  You should not drink grapefruit juice or eat grapefruit during treatment with cabozantinib. It may change the amount of cabozantinib in your blood.  Take cabozantinib exactly as prescribed.  Swallow cabozantinib capsules whole with at least 8 ounces of water. Do not crush, dissolve or open capsules.  Do not change your dose or stop cabozantinib unless your health care provider tells you to.  If you miss a dose and your next dose is due in:  Less than 12 hours, take your next dose at the normal time. Do not make up the next dose.  12 hours or more, take the missed dose as soon as you remember. Take your next dose at the normal time.  Do not take more than 1 dose of cabozantinib at one time.  Call your health care provider right away if you take too much.  The amount of cabozantinib that you will receive depends on many factors, your general health or other health problems, and the type of cancer or condition being treated. Your doctor will determine your dose and schedule.   What are the possible side effects of CABOMETYX? CABOMETYX may cause serious side effects, including: Severe bleeding (hemorrhage). Tell your healthcare provider right away if you have any signs of bleeding during treatment with CABOMETYX, including:  coughing up blood or blood clots  vomiting blood or if your vomit looks like coffee grounds  red or black (looks like tar) stools  menstrual bleeding that is heavier than normal  any unusual or heavy bleeding A tear in your stomach or intestinal wall (perforation) or an abnormal connection between 2 parts of your body (fistula). Tell your healthcare provider right away if you have tenderness or pain in your stomach area (abdomen).  Blood clots, stroke, heart attack, and chest pain. Get emergency help right away if you have:  swelling or pain in your arms or legs  shortness of breath  feel lightheaded or faint  sweating more than usual  numbness or weakness of your face, arm, or leg, especially on one side of your body  sudden confusion, trouble speaking, or understanding  sudden trouble seeing in one or both eyes  sudden trouble walking  dizziness, loss of balance or  coordination  a sudden severe headache High blood pressure (hypertension). Hypertension is common with CABOMETYX and sometimes can be severe. Your healthcare provider will check your blood pressure before starting CABOMETYX and during treatment with CABOMETYX. If needed, your healthcare provider may prescribe medicine to treat your high blood pressure.  Diarrhea. Diarrhea is common with CABOMETYX and can be severe. If needed, your healthcare provider may prescribe medicine to treat your diarrhea.  Tell your healthcare provider right away if you have frequent loose, watery bowel movements.  A skin problem called hand-foot skin reaction. Hand-foot skin reactions are common and can be severe. Tell your healthcare provider right away if you have rashes, redness, pain, swelling, or blisters on the palms of your hands or soles of your feet.  Reversible Posterior Leukoencephalopathy Syndrome (RPLS). A condition called reversible posterior leukoencephalopathy syndrome can happen during treatment with CABOMETYX. Tell your healthcare provider right away if you have headaches, seizures, confusion, changes in vision, or problems thinking. Your healthcare provider may change your dose, temporarily stop, or permanently stop treatment with CABOMETYX if you have certain side effects. The most common side effects of CABOMETYX are: tiredness  nausea  decreased appetite  vomiting  weight loss  constipation Tell your healthcare provider if you have any side effect that bothers you or that does not go away.   When to contact your doctor or health care provider:  Seek emergency help immediately and notify your health care provider, if you experience the following symptoms:  Chest pain, shortness of breath   Contact your health care provider immediately, day or night, if you should experience any of the following symptoms:  Fever of 100.5 F (38 C or higher, chills)  Unusual bleeding  Black or tarry stools, or blood in your stools  Severe headache, light headedness or other neurological symptoms (numbness, tingling, difficulty speaking, Change in thinking clearly and with logic). Always inform your health care provider if you experience any unusual symptoms.   The following symptoms require medical attention, but are not an emergency. Contact your health care provider within 24 hours of noticing any of the following:  Diarrhea (4-6 episodes in a 24-hour period).  Nausea (interferes with ability to eat  and unrelieved with prescribed medication).  Vomiting (vomiting more than 4-5 times in a 24 hour period).  Unable to eat or drink for 24 hours or have signs of dehydration: tiredness, thirst, dry mouth, dark and decrease amount of urine, or dizziness.  Skin or the whites of your eyes turn yellow  Urine turns dark or brown (tea color)  Decreased appetite  Pain on the right side of your stomach  Bleed or bruise more easily than normal  Itching  Cough with or without mucus  Mouth sores  Pain or burning with urination  Extreme fatigue (unable to carry on self-care activities) Always inform your health care provider if you experience any unusual symptoms.   Precautions:  Before starting cabozantinib treatment, make sure you tell your doctor about any other medications you are taking (including prescription, over-the-counter, vitamins, herbal remedies, etc.).  Do not receive any kind of immunization or vaccination without your doctor's approval while taking cabozantinib.  Inform your health care professional if you are pregnant or may be pregnant prior to starting this treatment. Pregnancy category D (cabozantinib may be hazardous to the fetus. Women who are pregnant or become pregnant must be advised of the potential hazard to the fetus.)  For both men and women: Do not conceive  a child (get pregnant) while taking cabozantinib. Barrier methods of contraception, such as condoms, are recommended during treatment and for 4 months after treatment is complete. Discuss with your doctor when you may safely become pregnant or conceive a child after therapy.  Do not breast feed while taking this medication.  You should not drink grapefruit juice or eat grapefruit during your treatment with cabozantinib. It may make the amount of cabozantinib in your blood increase to a harmful level.   Self-Care Tips:  Drink at least two to three quarts of fluid every 24 hours, unless you are instructed otherwise.  You  may be at risk of infection so try to avoid crowds or people with colds, and report fever or any other signs of infection immediately to your health care provider.  Wash your hands often.  Monitor your blood pressure and notify your physician if blood pressure is elevated or if you develop severe headache, light headedness or other neurological symptoms (numbness, tingling, difficulty speaking).  To help treat/prevent mouth sores, use a soft toothbrush, and rinse three times a day with 1 teaspoon of baking soda mixed with 8 ounces of water.  Use an electric razor and a soft toothbrush to minimize bleeding.  Avoid contact sports or activities that could cause injury.  To reduce nausea, take anti-nausea medications as prescribed by your doctor, and eat small, frequent meals.  Follow regimen of anti-diarrhea medication as prescribed by your health care professional.  Eat foods that may help reduce diarrhea (see managing side effects - diarrhea ).  Prevention of hand-foot syndrome. Modification of normal activities of daily living to reduce friction and heat exposure to hands and feet, for about a week after treatment.  Keep palms of hands and soles of feet moist using emollients.  Avoid sun exposure. Wear SPF 15 (or higher) sun block and protective clothing.  In general, drinking alcoholic beverages should be kept to a minimum or avoided completely. You should discuss this with your doctor.  Get plenty of rest.  Maintain good nutrition.  Maintain physical activity as tolerated.  If you experience symptoms or side effects, be sure to discuss them with your health care team. They can prescribe medications and/or offer other suggestions that are effective in managing such problems.   EDUCATIONAL MATERIALS GIVEN AND REVIEWED: Chemotherapy and you book given.  Nutrition book given.  Information on cabometyx.    SELF CARE ACTIVITIES WHILE ON CHEMOTHERAPY: Hydration Increase your fluid intake 48 hours  prior to treatment and drink at least 8 to 12 cups (64 ounces) of water/decaff beverages per day after treatment. You can still have your cup of coffee or soda but these beverages do not count as part of your 8 to 12 cups that you need to drink daily. No alcohol intake.  Medications Continue taking your normal prescription medication as prescribed.  If you start any new herbal or new supplements please let us know first to make sure it is safe.  Mouth Care Have teeth cleaned professionally before starting treatment. Keep dentures and partial plates clean. Use soft toothbrush and do not use mouthwashes that contain alcohol. Biotene is a good mouthwash that is available at most pharmacies or may be ordered by calling [phone]. Use warm salt water gargles (1 teaspoon salt per 1 quart warm water) before and after meals and at bedtime. Or you may rinse with 2 tablespoons of three-percent hydrogen peroxide mixed in eight ounces of water. If you are still having problems  with your mouth or sores in your mouth please call the clinic. If you need dental work, please let Dr. Whitney Muse know before you go for your appointment so that we can coordinate the best possible time for you in regards to your chemo regimen. You need to also let your dentist know that you are actively taking chemo. We may need to do labs prior to your dental appointment.   Skin Care Always use sunscreen that has not expired and with SPF (Sun Protection Factor) of 50 or higher. Wear hats to protect your head from the sun. Remember to use sunscreen on your hands, ears, face, & feet.  Use good moisturizing lotions such as udder cream, eucerin, or even Vaseline. Some chemotherapies can cause dry skin, color changes in your skin and nails.    . Avoid long, hot showers or baths. . Use gentle, fragrance-free soaps and laundry detergent. . Use moisturizers, preferably creams or ointments rather than lotions because the thicker consistency is  better at preventing skin dehydration. Apply the cream or ointment within 15 minutes of showering. Reapply moisturizer at night, and moisturize your hands every time after you wash them.  Hair Loss (if your doctor says your hair will fall out)  . If your doctor says that your hair is likely to fall out, decide before you begin chemo whether you want to wear a wig. You may want to shop before treatment to match your hair color. . Hats, turbans, and scarves can also camouflage hair loss, although some people prefer to leave their heads uncovered. If you go bare-headed outdoors, be sure to use sunscreen on your scalp. . Cut your hair short. It eases the inconvenience of shedding lots of hair, but it also can reduce the emotional impact of watching your hair fall out. . Don't perm or color your hair during chemotherapy. Those chemical treatments are already damaging to hair and can enhance hair loss. Once your chemo treatments are done and your hair has grown back, it's OK to resume dyeing or perming hair. With chemotherapy, hair loss is almost always temporary. But when it grows back, it may be a different color or texture. In older adults who still had hair color before chemotherapy, the new growth may be completely gray.  Often, new hair is very fine and soft.  Infection Prevention Please wash your hands for at least 30 seconds using warm soapy water. Handwashing is the #1 way to prevent the spread of germs. Stay away from sick people or people who are getting over a cold. If you develop respiratory systems such as green/yellow mucus production or productive cough or persistent cough let us know and we will see if you need an antibiotic. It is a good idea to keep a pair of gloves on when going into grocery stores/[employer] to decrease your risk of coming into contact with germs on the carts, etc. Carry alcohol hand gel with you at all times and use it frequently if out in public. If your temperature reaches  100.5 or higher please call the clinic and let us know.  If it is after hours or on the weekend please go to the ER if your temperature is over 100.5.  Please have your own personal thermometer at home to use.    Sex and bodily fluids If you are going to have sex, a condom must be used to protect the person that isn't taking chemotherapy. Chemo can decrease your libido (sex drive). For a few  days after chemotherapy, chemotherapy can be excreted through your bodily fluids.  When using the toilet please close the lid and flush the toilet twice.  Do this for a few day after you have had chemotherapy.     Effects of chemotherapy on your sex life Some changes are simple and won't last long. They won't affect your sex life permanently. Sometimes you may feel: . too tired . not strong enough to be very active . sick or sore  . not in the mood . anxious or low Your anxiety might not seem related to sex. For example, you may be worried about the cancer and how your treatment is going. Or you may be worried about money, or about how you family are coping with your illness. These things can cause stress, which can affect your interest in sex. It's important to talk to your partner about how you feel. Remember - the changes to your sex life don't usually last long. There's usually no medical reason to stop having sex during chemo. The drugs won't have any long term physical effects on your performance or enjoyment of sex. Cancer can't be passed on to your partner during sex  Contraception It's important to use reliable contraception during treatment. Avoid getting pregnant while you or your partner are having chemotherapy. This is because the drugs may harm the baby. Sometimes chemotherapy drugs can leave a man or woman infertile.  This means you would not be able to have children in the future. You might want to talk to someone about permanent infertility. It can be very difficult to learn that you may no  longer be able to have children. Some people find counselling helpful. There might be ways to preserve your fertility, although this is easier for men than for women. You may want to speak to a fertility expert. You can talk about sperm banking or harvesting your eggs. You can also ask about other fertility options, such as donor eggs. If you have or have had breast cancer, your doctor might advise you not to take the contraceptive pill. This is because the hormones in it might affect the cancer.  It is not known for sure whether or not chemotherapy drugs can be passed on through semen or secretions from the vagina. Because of this some doctors advise people to use a barrier method if you have sex during treatment. This applies to vaginal, anal or oral sex. Generally, doctors advise a barrier method only for the time you are actually having the treatment and for about a week after your treatment. Advice like this can be worrying, but this does not mean that you have to avoid being intimate with your partner. You can still have close contact with your partner and continue to enjoy sex.  Animals If you have cats or birds we just ask that you not change the litter or change the cage.  Please have someone else do this for you while you are on chemotherapy.   Food Safety During and After Cancer Treatment Food safety is important for people both during and after cancer treatment. Cancer and cancer treatments, such as chemotherapy, radiation therapy, and stem cell/bone marrow transplantation, often weaken the immune system. This makes it harder for your body to protect itself from foodborne illness, also called food poisoning. Foodborne illness is caused by eating food that contains harmful bacteria, parasites, or viruses.  Foods to avoid Some foods have a higher risk of becoming tainted with bacteria. These include: .  Unwashed fresh fruit and vegetables, especially leafy vegetables that can hide dirt and  other contaminants . Raw sprouts, such as alfalfa sprouts . Raw or undercooked beef, especially ground beef, or other raw or undercooked meat and poultry . Fatty, fried, or spicy foods immediately before or after treatment.  These can sit heavy on your stomach and make you feel nauseous. . Raw or undercooked shellfish, such as oysters. . Sushi and sashimi, which often contain raw fish.  . Unpasteurized beverages, such as unpasteurized fruit juices, raw milk, raw yogurt, or cider . Undercooked eggs, such as soft boiled, over easy, and poached; raw, unpasteurized eggs; or foods made with raw egg, such as homemade raw cookie dough and homemade mayonnaise Simple steps for food safety Shop smart. . Do not buy food stored or displayed in an unclean area. . Do not buy bruised or damaged fruits or vegetables. . Do not buy cans that have cracks, dents, or bulges. . Pick up foods that can spoil at the end of your shopping trip and store them in a cooler on the way home. Prepare and clean up foods carefully. . Rinse all fresh fruits and vegetables under running water, and dry them with a clean towel or paper towel. . Clean the top of cans before opening them. . After preparing food, wash your hands for 20 seconds with hot water and soap. Pay special attention to areas between fingers and under nails. . Clean your utensils and dishes with hot water and soap. [place] Kitchen Disinfect your kitchen and cutting boards using 1 teaspoon of liquid, unscented bleach mixed into 1 quart of water.   Dispose of old food. . Eat canned and packaged food before its expiration date (the "use by" or "best before" date). . Consume refrigerated leftovers within 3 to 4 days. After that time, throw out the food. Even if the food does not smell or look spoiled, it still may be unsafe. Some bacteria, such as Listeria, can grow even on foods stored in the refrigerator if they are kept for too long. Take precautions when eating out. . At  restaurants, avoid buffets and salad bars where food sits out for a long time and comes in contact with many people. Food can become contaminated when someone with a virus, often a norovirus, or another "bug" handles it. . Put any leftover food in a "to-go" container yourself, rather than having the server do it. And, refrigerate leftovers as soon as you get home. . Choose restaurants that are clean and that are willing to prepare your food as you order it cooked.     MEDICATIONS: Zofran/Ondansetron '8mg'$  tablet. Take 1 tablet every 8 hours as needed for nausea/vomiting. (#1 nausea med to take, this can constipate)  Compazine/Prochlorperazine '10mg'$  tablet. Take 1 tablet every 6 hours as needed for nausea/vomiting. (#2 nausea med to take, this can make you sleepy)   Over-the-Counter Meds:  Miralax 1 capful in 8 oz of fluid daily. May increase to two times a day if needed. This is a stool softener. If this doesn't work proceed you can add:  Senokot S-start with 1 tablet two times a day and increase to 4 tablets two times a day if needed. (total of 8 tablets in a 24 hour period). This is a stimulant laxative.   Call us if this does not help your bowels move.   Imodium '2mg'$  capsule. Take 2 capsules after the 1st loose stool and then 1 capsule every 2 hours until you  go a total of 12 hours without having a loose stool. Call the [facility] if loose stools continue. If diarrhea occurs @ bedtime, take 2 capsules @ bedtime. Then take 2 capsules every 4 hours until morning. Call [facility].    Diarrhea Sheet  If you are having loose stools/diarrhea, please purchase Imodium and begin taking as outlined:  At the first sign of poorly formed or loose stools you should begin taking Imodium(loperamide) 2 mg capsules.  Take two caplets ('4mg'$ ) followed by one caplet ('2mg'$ ) every 2 hours until you have had no diarrhea for 12 hours.  During the night take two caplets ('4mg'$ ) at bedtime and continue every 4 hours  during the night until the morning.  Stop taking Imodium only after there is no sign of diarrhea for 12 hours.    Always call the [facility] if you are having loose stools/diarrhea that you can't get under control.  Loose stools/disrrhea leads to dehydration (loss of water) in your body.  We have other options of trying to get the loose stools/diarrhea to stopped but you must let us know!    Constipation Sheet *Miralax in 8 oz of fluid daily.  May increase to two times a day if needed.  This is a stool softener.  If this not enough to keep your bowel regular:  You can add:  *Senokot S, start with one tablet twice a day and can increase to 4 tablets twice a day if needed.  This is a stimulant laxative.   Sometimes when you take pain medication you need BOTH a medicine to keep your stool soft and a medicine to help your bowel push it out!  Please call if the above does not work for you.   Do not go more than 2 days without a bowel movement.  It is very important that you do not become constipated.  It will make you feel sick to your stomach (nausea) and can cause abdominal pain and vomiting.    Nausea Sheet  Zofran/Ondansetron '8mg'$  tablet. Take 1 tablet every 8 hours as needed for nausea/vomiting. (#1 nausea med to take, this can constipate)  Compazine/Prochlorperazine '10mg'$  tablet. Take 1 tablet every 6 hours as needed for nausea/vomiting. (#2 nausea med to take, this can make you sleepy)  You can take these medications together or separately.  We would first like for you to try the Ondansetron by itself and then take the Prochloperizine if needed. But you are allowed to take both medications at the same time if your nausea is that severe.  If you are having persistent nausea (nausea that does not stop) please take these medications on a staggered schedule so that the nausea medication stays in your body.  Please call the [facility] and let us know the amount of nausea that you are  experiencing.  If you begin to vomit, you need to call the [facility] and if it is the weekend and you have vomited more than one time and cant get it to stop-go to the Emergency Room.  Persistent nausea/vomiting can lead to dehydration (loss of fluid in your body) and will make you feel terrible.   Ice chips, sips of clear liquids, foods that are @ room temperature, crackers, and toast tend to be better tolerated.     SYMPTOMS TO REPORT AS SOON AS POSSIBLE AFTER TREATMENT:  FEVER GREATER THAN 100.5 F  CHILLS WITH OR WITHOUT FEVER  NAUSEA AND VOMITING THAT IS NOT CONTROLLED WITH YOUR  NAUSEA MEDICATION  UNUSUAL SHORTNESS OF BREATH  UNUSUAL BRUISING OR BLEEDING  TENDERNESS IN MOUTH AND THROAT WITH OR WITHOUT PRESENCE OF ULCERS  URINARY PROBLEMS  BOWEL PROBLEMS  UNUSUAL RASH    Wear comfortable clothing and clothing appropriate for easy access to any Portacath or PICC line. Let us know if there is anything that we can do to make your therapy better!    What to do if you need assistance after hours or on the weekends: CALL [phone].  HOLD on the line, do not hang up.  You will hear multiple messages but at the end you will be connected with a nurse triage line.  They will contact Dr Whitney Muse if necessary.  Most of the time they will be able to assist you.   Do not call the hospital operator.  Dr Whitney Muse will not answer phone calls received by them.     I have been informed and understand all of the instructions given to me and have received a copy. I have been instructed to call the clinic [phone] or my family physician as soon as possible for continued medical care, if indicated. I do not have any more questions at this time but understand that I may call the [place] or the Patient Navigator at [phone] during office hours should I have questions or need assistance in obtaining follow-up care.

## 2015-12-24 NOTE — Progress Notes (Signed)
Chemotherapy education pulled together.

## 2015-12-27 ENCOUNTER — Inpatient Hospital Stay (HOSPITAL_COMMUNITY): Payer: Self-pay

## 2015-12-27 ENCOUNTER — Ambulatory Visit (HOSPITAL_COMMUNITY): Payer: Self-pay | Admitting: Oncology

## 2015-12-27 DIAGNOSIS — Z9221 Personal history of antineoplastic chemotherapy: Secondary | ICD-10-CM | POA: Diagnosis not present

## 2015-12-27 DIAGNOSIS — T8579XA Infection and inflammatory reaction due to other internal prosthetic devices, implants and grafts, initial encounter: Secondary | ICD-10-CM | POA: Diagnosis not present

## 2015-12-27 DIAGNOSIS — I10 Essential (primary) hypertension: Secondary | ICD-10-CM | POA: Diagnosis not present

## 2015-12-27 DIAGNOSIS — J9809 Other diseases of bronchus, not elsewhere classified: Secondary | ICD-10-CM | POA: Diagnosis not present

## 2015-12-27 DIAGNOSIS — T85628A Displacement of other specified internal prosthetic devices, implants and grafts, initial encounter: Secondary | ICD-10-CM | POA: Diagnosis not present

## 2015-12-27 DIAGNOSIS — C7802 Secondary malignant neoplasm of left lung: Secondary | ICD-10-CM | POA: Diagnosis not present

## 2015-12-27 DIAGNOSIS — E7800 Pure hypercholesterolemia, unspecified: Secondary | ICD-10-CM | POA: Diagnosis not present

## 2015-12-27 DIAGNOSIS — F329 Major depressive disorder, single episode, unspecified: Secondary | ICD-10-CM | POA: Diagnosis not present

## 2015-12-27 DIAGNOSIS — E119 Type 2 diabetes mellitus without complications: Secondary | ICD-10-CM | POA: Diagnosis not present

## 2015-12-27 DIAGNOSIS — D491 Neoplasm of unspecified behavior of respiratory system: Secondary | ICD-10-CM | POA: Diagnosis not present

## 2015-12-27 DIAGNOSIS — C641 Malignant neoplasm of right kidney, except renal pelvis: Secondary | ICD-10-CM | POA: Diagnosis not present

## 2015-12-27 DIAGNOSIS — J189 Pneumonia, unspecified organism: Secondary | ICD-10-CM | POA: Diagnosis not present

## 2015-12-27 DIAGNOSIS — K219 Gastro-esophageal reflux disease without esophagitis: Secondary | ICD-10-CM | POA: Diagnosis not present

## 2015-12-27 DIAGNOSIS — Y831 Surgical operation with implant of artificial internal device as the cause of abnormal reaction of the patient, or of later complication, without mention of misadventure at the time of the procedure: Secondary | ICD-10-CM | POA: Diagnosis not present

## 2015-12-27 DIAGNOSIS — C7800 Secondary malignant neoplasm of unspecified lung: Secondary | ICD-10-CM | POA: Diagnosis not present

## 2015-12-27 DIAGNOSIS — C7801 Secondary malignant neoplasm of right lung: Secondary | ICD-10-CM | POA: Diagnosis not present

## 2015-12-27 DIAGNOSIS — Z85528 Personal history of other malignant neoplasm of kidney: Secondary | ICD-10-CM | POA: Diagnosis not present

## 2015-12-28 DIAGNOSIS — J90 Pleural effusion, not elsewhere classified: Secondary | ICD-10-CM | POA: Diagnosis not present

## 2015-12-28 DIAGNOSIS — C641 Malignant neoplasm of right kidney, except renal pelvis: Secondary | ICD-10-CM | POA: Diagnosis not present

## 2015-12-28 DIAGNOSIS — E119 Type 2 diabetes mellitus without complications: Secondary | ICD-10-CM | POA: Diagnosis not present

## 2015-12-28 DIAGNOSIS — J189 Pneumonia, unspecified organism: Secondary | ICD-10-CM | POA: Diagnosis not present

## 2015-12-28 DIAGNOSIS — J9809 Other diseases of bronchus, not elsewhere classified: Secondary | ICD-10-CM | POA: Diagnosis not present

## 2015-12-28 DIAGNOSIS — C7800 Secondary malignant neoplasm of unspecified lung: Secondary | ICD-10-CM | POA: Diagnosis not present

## 2015-12-29 DIAGNOSIS — C7800 Secondary malignant neoplasm of unspecified lung: Secondary | ICD-10-CM | POA: Diagnosis not present

## 2015-12-29 DIAGNOSIS — J189 Pneumonia, unspecified organism: Secondary | ICD-10-CM | POA: Diagnosis not present

## 2015-12-29 DIAGNOSIS — E119 Type 2 diabetes mellitus without complications: Secondary | ICD-10-CM | POA: Diagnosis not present

## 2015-12-29 DIAGNOSIS — C641 Malignant neoplasm of right kidney, except renal pelvis: Secondary | ICD-10-CM | POA: Diagnosis not present

## 2015-12-30 DIAGNOSIS — C7800 Secondary malignant neoplasm of unspecified lung: Secondary | ICD-10-CM | POA: Diagnosis not present

## 2015-12-30 DIAGNOSIS — J189 Pneumonia, unspecified organism: Secondary | ICD-10-CM | POA: Diagnosis not present

## 2015-12-30 DIAGNOSIS — F329 Major depressive disorder, single episode, unspecified: Secondary | ICD-10-CM | POA: Diagnosis not present

## 2015-12-30 DIAGNOSIS — C649 Malignant neoplasm of unspecified kidney, except renal pelvis: Secondary | ICD-10-CM | POA: Diagnosis not present

## 2016-01-01 ENCOUNTER — Encounter (HOSPITAL_COMMUNITY): Payer: PPO

## 2016-01-01 DIAGNOSIS — R112 Nausea with vomiting, unspecified: Secondary | ICD-10-CM

## 2016-01-01 DIAGNOSIS — J189 Pneumonia, unspecified organism: Secondary | ICD-10-CM | POA: Diagnosis not present

## 2016-01-01 DIAGNOSIS — F329 Major depressive disorder, single episode, unspecified: Secondary | ICD-10-CM | POA: Diagnosis not present

## 2016-01-01 DIAGNOSIS — Z7951 Long term (current) use of inhaled steroids: Secondary | ICD-10-CM | POA: Diagnosis not present

## 2016-01-01 DIAGNOSIS — C7800 Secondary malignant neoplasm of unspecified lung: Secondary | ICD-10-CM | POA: Diagnosis not present

## 2016-01-01 DIAGNOSIS — E119 Type 2 diabetes mellitus without complications: Secondary | ICD-10-CM | POA: Diagnosis not present

## 2016-01-01 DIAGNOSIS — Z9181 History of falling: Secondary | ICD-10-CM | POA: Diagnosis not present

## 2016-01-01 DIAGNOSIS — J9809 Other diseases of bronchus, not elsewhere classified: Secondary | ICD-10-CM | POA: Diagnosis not present

## 2016-01-01 DIAGNOSIS — I10 Essential (primary) hypertension: Secondary | ICD-10-CM | POA: Diagnosis not present

## 2016-01-01 DIAGNOSIS — C641 Malignant neoplasm of right kidney, except renal pelvis: Secondary | ICD-10-CM | POA: Diagnosis not present

## 2016-01-01 DIAGNOSIS — E785 Hyperlipidemia, unspecified: Secondary | ICD-10-CM | POA: Diagnosis not present

## 2016-01-01 DIAGNOSIS — C649 Malignant neoplasm of unspecified kidney, except renal pelvis: Secondary | ICD-10-CM

## 2016-01-01 MED ORDER — ONDANSETRON HCL 8 MG PO TABS: 8.0000 mg | ORAL_TABLET | Freq: Three times a day (TID) | ORAL | 4 refills | Status: AC | PRN

## 2016-01-01 MED ORDER — OXYCODONE HCL 5 MG PO TABS: 5.0000 mg | ORAL_TABLET | Freq: Four times a day (QID) | ORAL | 0 refills | Status: DC | PRN

## 2016-01-02 DIAGNOSIS — D7389 Other diseases of spleen: Secondary | ICD-10-CM | POA: Diagnosis not present

## 2016-01-02 DIAGNOSIS — R590 Localized enlarged lymph nodes: Secondary | ICD-10-CM | POA: Diagnosis not present

## 2016-01-02 DIAGNOSIS — C649 Malignant neoplasm of unspecified kidney, except renal pelvis: Secondary | ICD-10-CM | POA: Diagnosis not present

## 2016-01-02 DIAGNOSIS — R918 Other nonspecific abnormal finding of lung field: Secondary | ICD-10-CM | POA: Diagnosis not present

## 2016-01-02 DIAGNOSIS — C641 Malignant neoplasm of right kidney, except renal pelvis: Secondary | ICD-10-CM | POA: Diagnosis not present

## 2016-01-03 ENCOUNTER — Other Ambulatory Visit: Payer: Self-pay | Admitting: *Deleted

## 2016-01-03 NOTE — Patient Outreach (Signed)
[place] [facility]) Care Management  01/03/2016  [name] [name] 09/14/1950 [id number]  Referral form HTA member discharged from non [facility] facility:  Telephone call to patient; left message on voice mail requesting call back.   Plan: Will follow up.  Sherrin Daisy, RN BSN [place] Management Coordinator [facility] Care Management  [phone]

## 2016-01-06 ENCOUNTER — Encounter (HOSPITAL_COMMUNITY): Payer: Self-pay | Admitting: Hematology & Oncology

## 2016-01-07 ENCOUNTER — Ambulatory Visit: Payer: Self-pay | Admitting: *Deleted

## 2016-01-07 DIAGNOSIS — Z7951 Long term (current) use of inhaled steroids: Secondary | ICD-10-CM | POA: Diagnosis not present

## 2016-01-07 DIAGNOSIS — E785 Hyperlipidemia, unspecified: Secondary | ICD-10-CM | POA: Diagnosis not present

## 2016-01-07 DIAGNOSIS — F329 Major depressive disorder, single episode, unspecified: Secondary | ICD-10-CM | POA: Diagnosis not present

## 2016-01-07 DIAGNOSIS — J189 Pneumonia, unspecified organism: Secondary | ICD-10-CM | POA: Diagnosis not present

## 2016-01-07 DIAGNOSIS — I10 Essential (primary) hypertension: Secondary | ICD-10-CM | POA: Diagnosis not present

## 2016-01-07 DIAGNOSIS — Z9181 History of falling: Secondary | ICD-10-CM | POA: Diagnosis not present

## 2016-01-07 DIAGNOSIS — E119 Type 2 diabetes mellitus without complications: Secondary | ICD-10-CM | POA: Diagnosis not present

## 2016-01-07 DIAGNOSIS — C7800 Secondary malignant neoplasm of unspecified lung: Secondary | ICD-10-CM | POA: Diagnosis not present

## 2016-01-07 DIAGNOSIS — J9809 Other diseases of bronchus, not elsewhere classified: Secondary | ICD-10-CM | POA: Diagnosis not present

## 2016-01-07 DIAGNOSIS — C641 Malignant neoplasm of right kidney, except renal pelvis: Secondary | ICD-10-CM | POA: Diagnosis not present

## 2016-01-08 DIAGNOSIS — E785 Hyperlipidemia, unspecified: Secondary | ICD-10-CM | POA: Diagnosis not present

## 2016-01-08 DIAGNOSIS — J189 Pneumonia, unspecified organism: Secondary | ICD-10-CM | POA: Diagnosis not present

## 2016-01-08 DIAGNOSIS — C641 Malignant neoplasm of right kidney, except renal pelvis: Secondary | ICD-10-CM | POA: Diagnosis not present

## 2016-01-08 DIAGNOSIS — J9809 Other diseases of bronchus, not elsewhere classified: Secondary | ICD-10-CM | POA: Diagnosis not present

## 2016-01-08 DIAGNOSIS — Z9181 History of falling: Secondary | ICD-10-CM | POA: Diagnosis not present

## 2016-01-08 DIAGNOSIS — Z7951 Long term (current) use of inhaled steroids: Secondary | ICD-10-CM | POA: Diagnosis not present

## 2016-01-08 DIAGNOSIS — C7800 Secondary malignant neoplasm of unspecified lung: Secondary | ICD-10-CM | POA: Diagnosis not present

## 2016-01-08 DIAGNOSIS — E119 Type 2 diabetes mellitus without complications: Secondary | ICD-10-CM | POA: Diagnosis not present

## 2016-01-08 DIAGNOSIS — I10 Essential (primary) hypertension: Secondary | ICD-10-CM | POA: Diagnosis not present

## 2016-01-08 DIAGNOSIS — F329 Major depressive disorder, single episode, unspecified: Secondary | ICD-10-CM | POA: Diagnosis not present

## 2016-01-09 ENCOUNTER — Other Ambulatory Visit: Payer: Self-pay | Admitting: *Deleted

## 2016-01-09 ENCOUNTER — Telehealth (HOSPITAL_COMMUNITY): Payer: Self-pay | Admitting: Emergency Medicine

## 2016-01-09 DIAGNOSIS — I10 Essential (primary) hypertension: Secondary | ICD-10-CM | POA: Diagnosis not present

## 2016-01-09 DIAGNOSIS — C7800 Secondary malignant neoplasm of unspecified lung: Secondary | ICD-10-CM | POA: Diagnosis not present

## 2016-01-09 DIAGNOSIS — E119 Type 2 diabetes mellitus without complications: Secondary | ICD-10-CM | POA: Diagnosis not present

## 2016-01-09 DIAGNOSIS — E785 Hyperlipidemia, unspecified: Secondary | ICD-10-CM | POA: Diagnosis not present

## 2016-01-09 DIAGNOSIS — C641 Malignant neoplasm of right kidney, except renal pelvis: Secondary | ICD-10-CM | POA: Diagnosis not present

## 2016-01-09 DIAGNOSIS — Z7951 Long term (current) use of inhaled steroids: Secondary | ICD-10-CM | POA: Diagnosis not present

## 2016-01-09 DIAGNOSIS — Z9181 History of falling: Secondary | ICD-10-CM | POA: Diagnosis not present

## 2016-01-09 DIAGNOSIS — J9809 Other diseases of bronchus, not elsewhere classified: Secondary | ICD-10-CM | POA: Diagnosis not present

## 2016-01-09 DIAGNOSIS — J189 Pneumonia, unspecified organism: Secondary | ICD-10-CM | POA: Diagnosis not present

## 2016-01-09 DIAGNOSIS — F329 Major depressive disorder, single episode, unspecified: Secondary | ICD-10-CM | POA: Diagnosis not present

## 2016-01-09 NOTE — Patient Outreach (Signed)
[facility]) Care Management  01/09/2016  [name] [name] 1950/06/28 [id number]  Telephone call to patient who was advised of reason for call & [facility] care management services. HIPPA verification received from patient.   Patient states she was recently hospitalized with pneumonia at [facility]. States she also has history of kidney cancer & lung cancer. Voices she is currently taking chemotherapy in pill form. Voices that her spouse is managing her medications and daughter is assisting her also during her recuperation. States no trouble getting prescriptions filled. States she is coughing some without sputum production & no fever since hospital stay.   States she has home health services that provide nursing, physical & occupational therapy which started shortly after discharge on 12/18. States they have been out several times & will be providing services today.   Has appointment with oncologist at [facility] 12/29. States  appointment with Dr. Vito Berger has been scheduled in [facility]. States she plans to make appointment with primary care. Voices that spouse will provide transportation to MD appointment.   States she knows when to call doctors about symptoms and when to call 911. States she has access to her doctors and is getting calls from MD offices since her discharge. No further case management needs at this time.   Plan: Close out case.   Sherrin Daisy, RN BSN [place] Management Coordinator [facility] Care Management  [phone]

## 2016-01-09 NOTE — Telephone Encounter (Signed)
Pt started taking her cabometyx last night as prescribed.  Will call with any problems.

## 2016-01-10 ENCOUNTER — Encounter (HOSPITAL_BASED_OUTPATIENT_CLINIC_OR_DEPARTMENT_OTHER): Payer: PPO

## 2016-01-10 ENCOUNTER — Encounter (HOSPITAL_COMMUNITY): Payer: Self-pay | Admitting: Oncology

## 2016-01-10 ENCOUNTER — Other Ambulatory Visit (HOSPITAL_COMMUNITY): Payer: Self-pay

## 2016-01-10 ENCOUNTER — Encounter (HOSPITAL_COMMUNITY): Payer: PPO | Attending: Oncology | Admitting: Oncology

## 2016-01-10 VITALS — BP 138/73 | HR 97 | Temp 97.7°F | Resp 28 | Wt 162.5 lb

## 2016-01-10 DIAGNOSIS — I10 Essential (primary) hypertension: Secondary | ICD-10-CM | POA: Diagnosis not present

## 2016-01-10 DIAGNOSIS — C641 Malignant neoplasm of right kidney, except renal pelvis: Secondary | ICD-10-CM

## 2016-01-10 DIAGNOSIS — K760 Fatty (change of) liver, not elsewhere classified: Secondary | ICD-10-CM | POA: Diagnosis not present

## 2016-01-10 DIAGNOSIS — F329 Major depressive disorder, single episode, unspecified: Secondary | ICD-10-CM | POA: Diagnosis not present

## 2016-01-10 DIAGNOSIS — J9811 Atelectasis: Secondary | ICD-10-CM | POA: Diagnosis not present

## 2016-01-10 DIAGNOSIS — C7800 Secondary malignant neoplasm of unspecified lung: Secondary | ICD-10-CM

## 2016-01-10 DIAGNOSIS — C779 Secondary and unspecified malignant neoplasm of lymph node, unspecified: Secondary | ICD-10-CM

## 2016-01-10 DIAGNOSIS — R918 Other nonspecific abnormal finding of lung field: Secondary | ICD-10-CM | POA: Insufficient documentation

## 2016-01-10 DIAGNOSIS — C7802 Secondary malignant neoplasm of left lung: Secondary | ICD-10-CM | POA: Insufficient documentation

## 2016-01-10 DIAGNOSIS — F419 Anxiety disorder, unspecified: Secondary | ICD-10-CM | POA: Diagnosis not present

## 2016-01-10 DIAGNOSIS — G4730 Sleep apnea, unspecified: Secondary | ICD-10-CM | POA: Insufficient documentation

## 2016-01-10 DIAGNOSIS — R110 Nausea: Secondary | ICD-10-CM | POA: Diagnosis not present

## 2016-01-10 DIAGNOSIS — E7800 Pure hypercholesterolemia, unspecified: Secondary | ICD-10-CM | POA: Insufficient documentation

## 2016-01-10 DIAGNOSIS — O09A0 Supervision of pregnancy with history of molar pregnancy, unspecified trimester: Secondary | ICD-10-CM | POA: Insufficient documentation

## 2016-01-10 DIAGNOSIS — E119 Type 2 diabetes mellitus without complications: Secondary | ICD-10-CM | POA: Diagnosis not present

## 2016-01-10 DIAGNOSIS — C649 Malignant neoplasm of unspecified kidney, except renal pelvis: Secondary | ICD-10-CM | POA: Diagnosis not present

## 2016-01-10 DIAGNOSIS — C799 Secondary malignant neoplasm of unspecified site: Secondary | ICD-10-CM

## 2016-01-10 DIAGNOSIS — K219 Gastro-esophageal reflux disease without esophagitis: Secondary | ICD-10-CM | POA: Diagnosis not present

## 2016-01-10 DIAGNOSIS — Z905 Acquired absence of kidney: Secondary | ICD-10-CM | POA: Diagnosis not present

## 2016-01-10 DIAGNOSIS — J90 Pleural effusion, not elsewhere classified: Secondary | ICD-10-CM | POA: Insufficient documentation

## 2016-01-10 DIAGNOSIS — J189 Pneumonia, unspecified organism: Secondary | ICD-10-CM

## 2016-01-10 LAB — CBC WITH DIFFERENTIAL/PLATELET
BASOS PCT: 0 %
Basophils Absolute: 0 10*3/uL (ref 0.0–0.1)
EOS PCT: 2 %
Eosinophils Absolute: 0.2 10*3/uL (ref 0.0–0.7)
HCT: 40.4 % (ref 36.0–46.0)
HEMOGLOBIN: 12.9 g/dL (ref 12.0–15.0)
Lymphocytes Relative: 14 %
Lymphs Abs: 1.4 10*3/uL (ref 0.7–4.0)
MCH: 31.5 pg (ref 26.0–34.0)
MCHC: 31.9 g/dL (ref 30.0–36.0)
MCV: 98.8 fL (ref 78.0–100.0)
MONOS PCT: 5 %
Monocytes Absolute: 0.5 10*3/uL (ref 0.1–1.0)
NEUTROS PCT: 79 %
Neutro Abs: 7.9 10*3/uL — ABNORMAL HIGH (ref 1.7–7.7)
PLATELETS: 287 10*3/uL (ref 150–400)
RBC: 4.09 MIL/uL (ref 3.87–5.11)
RDW: 14.8 % (ref 11.5–15.5)
WBC: 9.9 10*3/uL (ref 4.0–10.5)

## 2016-01-10 LAB — TSH: TSH: 4.535 u[IU]/mL — ABNORMAL HIGH (ref 0.350–4.500)

## 2016-01-10 LAB — COMPREHENSIVE METABOLIC PANEL
ALBUMIN: 3.5 g/dL (ref 3.5–5.0)
ALK PHOS: 79 U/L (ref 38–126)
ALT: 14 U/L (ref 14–54)
ANION GAP: 10 (ref 5–15)
AST: 20 U/L (ref 15–41)
BUN: 19 mg/dL (ref 6–20)
CALCIUM: 9.5 mg/dL (ref 8.9–10.3)
CHLORIDE: 104 mmol/L (ref 101–111)
CO2: 23 mmol/L (ref 22–32)
Creatinine, Ser: 1.26 mg/dL — ABNORMAL HIGH (ref 0.44–1.00)
GFR calc non Af Amer: 43 mL/min — ABNORMAL LOW (ref >60)
GFR, EST AFRICAN AMERICAN: 50 mL/min — AB (ref >60)
GLUCOSE: 109 mg/dL — AB (ref 65–99)
Potassium: 4.1 mmol/L (ref 3.5–5.1)
SODIUM: 137 mmol/L (ref 135–145)
Total Bilirubin: 0.5 mg/dL (ref 0.3–1.2)
Total Protein: 7.6 g/dL (ref 6.5–8.1)

## 2016-01-10 MED ORDER — SODIUM CHLORIDE 0.9% FLUSH
20.0000 mL | INTRAVENOUS | Status: DC | PRN
  Administered 2016-01-10: 20 mL via INTRAVENOUS
  Filled 2016-01-10: qty 20

## 2016-01-10 MED ORDER — HEPARIN SOD (PORK) LOCK FLUSH 100 UNIT/ML IV SOLN
500.0000 [IU] | Freq: Once | INTRAVENOUS | Status: AC
  Administered 2016-01-10: 500 [IU] via INTRAVENOUS

## 2016-01-10 MED ORDER — AZITHROMYCIN 250 MG PO TABS: ORAL_TABLET | ORAL | 0 refills | Status: AC

## 2016-01-10 MED ORDER — HEPARIN SOD (PORK) LOCK FLUSH 100 UNIT/ML IV SOLN
INTRAVENOUS | Status: AC
  Filled 2016-01-10: qty 5

## 2016-01-10 NOTE — Patient Instructions (Signed)
[place] at [facility] Discharge Instructions  RECOMMENDATIONS MADE BY THE CONSULTANT AND ANY TEST RESULTS WILL BE SENT TO YOUR REFERRING PHYSICIAN.  Port flush with labs.    Thank you for choosing [place] at [facility] to provide your oncology and hematology care.  To afford each patient quality time with our provider, please arrive at least 15 minutes before your scheduled appointment time.    If you have a lab appointment with the [place] please come in thru the  Main Entrance and check in at the main information desk  You need to re-schedule your appointment should you arrive 10 or more minutes late.  We strive to give you quality time with our providers, and arriving late affects you and other patients whose appointments are after yours.  Also, if you no show three or more times for appointments you may be dismissed from the clinic at the providers discretion.     Again, thank you for choosing [facility].  Our hope is that these requests will decrease the amount of time that you wait before being seen by our physicians.       _____________________________________________________________  Should you have questions after your visit to [facility], please contact our office at [phone] between the hours of 8:30 a.m. and 4:30 p.m.  Voicemails left after 4:30 p.m. will not be returned until the following business day.  For prescription refill requests, have your pharmacy contact our office.       Resources For Cancer Patients and their Caregivers ? American Cancer Society: Can assist with transportation, wigs, general needs, runs Look Good Feel Better.        ([phone] 055-9879 ? Cancer Care: Provides financial assistance, online support groups, medication/co-pay assistance.  1-800-813-HOPE 838-424-5750) ? [place] Assists [place] cancer patients and their families through  emotional , educational and financial support.  (562)199-9021 ? [place] DSS Where to apply for food stamps, Medicaid and utility assistance. 803-233-7533 ? RCATS: Transportation to medical appointments. (574) 209-2429 ? Social Security Administration: May apply for disability if have a Stage IV cancer. (224)856-5161 7174834799 ? [employer], Disability and Transit Services: Assists with nutrition, care and transit needs. [place] Support Programs: '@10RELATIVEDAYS'$ @ > Cancer Support Group  2nd Tuesday of the month 1pm-2pm, Journey Room  > Creative Journey  3rd Tuesday of the month 1130am-1pm, Journey Room  > Look Good Feel Better  1st Wednesday of the month 10am-12 noon, Journey Room (Call [place] to register [phone])

## 2016-01-10 NOTE — Progress Notes (Signed)
[name] [name] presented for Portacath access and flush. Proper placement of portacath confirmed by CXR. Portacath located right chest wall accessed with  H 20 needle. Good blood return present.  Specimen drawn for labs.   Portacath flushed with 14m NS and 500U/560mHeparin and needle removed intact. Procedure without incident. Patient tolerated procedure well.

## 2016-01-10 NOTE — Addendum Note (Signed)
Addended by: Jaynie Collins R on: 01/10/2016 10:14 AM   Modules accepted: Orders

## 2016-01-10 NOTE — Assessment & Plan Note (Addendum)
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 Vito Berger.  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    Oncology history is updated.    Labs today: CBC diff, CMET, TSH (to monitor for late effects of immunotherapy).  I personally reviewed and went over laboratory results with the patient.  The results are noted within this dictation.  She is off of her Prednisone.  Baseline re-imaging at [facility] is reviewed via [place].  Oncology history is updated accordingly.  I personally reviewed and went over radiographic studies with the patient.  The results are noted within this dictation.    She was discharged from [facility] with an antibiotic- Augmentin.  She notes since starting Augmentin, she has been suffering with nausea.  I will D/C Augmentin and change her antibiotic to Z-Pak for an extended course.  This will also cover atypicals. She has only taken 2 doses of Cabometyx and therefore I feel that her illness is NOT chemotherapy-induced at this time.  We will monitor for perforations, fistulas, signs/symptoms of bleeding, palmar-plantar erythrodysesthesia syndrome (PPES), reversible posterior leukoencephalopathy syndrome (RPLS), proteinuria (regularly during therapy), osteonecrosis of the jaw (perform oral examination prior to initiation and periodically during therapy), wound healing complications, diarrhea, stomatitis.   I will keep Margarita Grizzle, NP apprised (cell#: [phone]) as she wants to schedule the patient for re-imaging ~ 8 weeks following start of Cabometyx (01/08/2016).  I called [place] at 9:06 AM and left her a VM letting her know that [place] started her Cabometyx on 12/27.  I left her with my pager number in the event that she wants to call back.  Labs in 2 weeks: CBC  diff, CMET, UA dipstick.  Return in 2 weeks for tolerance check and follow-up appointment.

## 2016-01-10 NOTE — Patient Instructions (Addendum)
[name] at [facility] Discharge Instructions  RECOMMENDATIONS MADE BY THE CONSULTANT AND ANY TEST RESULTS WILL BE SENT TO YOUR REFERRING PHYSICIAN.  You were seen today by [name], PA-C Stop taking the Augmentin  New antibiotic (Z-pack) sent to pharmacy You will have labs drawn today Return to clinic for labs and follow up in 2 weeks.   Thank you for choosing [place] at [facility] to provide your oncology and hematology care.  To afford each patient quality time with our provider, please arrive at least 15 minutes before your scheduled appointment time.    If you have a lab appointment with the [place] please come in thru the  Main Entrance and check in at the main information desk  You need to re-schedule your appointment should you arrive 10 or more minutes late.  We strive to give you quality time with our providers, and arriving late affects you and other patients whose appointments are after yours.  Also, if you no show three or more times for appointments you may be dismissed from the clinic at the providers discretion.     Again, thank you for choosing [facility].  Our hope is that these requests will decrease the amount of time that you wait before being seen by our physicians.       _____________________________________________________________  Should you have questions after your visit to [facility], please contact our office at [phone] between the hours of 8:30 a.m. and 4:30 p.m.  Voicemails left after 4:30 p.m. will not be returned until the following business day.  For prescription refill requests, have your pharmacy contact our office.       Resources For Cancer Patients and their Caregivers ? American Cancer Society: Can assist with transportation, wigs, general needs, runs Look Good Feel Better.        ([phone] 022-0427 ? Cancer Care: Provides financial assistance, online  support groups, medication/co-pay assistance.  1-800-813-HOPE 740-528-9644) ? [facility] Assists [place] cancer patients and their families through emotional , educational and financial support.  276-367-4782 ? [place] DSS Where to apply for food stamps, Medicaid and utility assistance. (760) 557-6069 ? RCATS: Transportation to medical appointments. (228)697-3725 ? Social Security Administration: May apply for disability if have a Stage IV cancer. (810)705-2184 (410) 479-1524 ? [employer], Disability and Transit Services: Assists with nutrition, care and transit needs. [place] Support Programs: '@10RELATIVEDAYS'$ @ > Cancer Support Group  2nd Tuesday of the month 1pm-2pm, Journey Room  > Creative Journey  3rd Tuesday of the month 1130am-1pm, Journey Room  > Look Good Feel Better  1st Wednesday of the month 10am-12 noon, Journey Room (Call [place] to register [phone])
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
## 2016-01-11 DIAGNOSIS — J9809 Other diseases of bronchus, not elsewhere classified: Secondary | ICD-10-CM | POA: Diagnosis not present

## 2016-01-11 DIAGNOSIS — Z7951 Long term (current) use of inhaled steroids: Secondary | ICD-10-CM | POA: Diagnosis not present

## 2016-01-11 DIAGNOSIS — Z9181 History of falling: Secondary | ICD-10-CM | POA: Diagnosis not present

## 2016-01-11 DIAGNOSIS — C7800 Secondary malignant neoplasm of unspecified lung: Secondary | ICD-10-CM | POA: Diagnosis not present

## 2016-01-11 DIAGNOSIS — F329 Major depressive disorder, single episode, unspecified: Secondary | ICD-10-CM | POA: Diagnosis not present

## 2016-01-11 DIAGNOSIS — E785 Hyperlipidemia, unspecified: Secondary | ICD-10-CM | POA: Diagnosis not present

## 2016-01-11 DIAGNOSIS — C641 Malignant neoplasm of right kidney, except renal pelvis: Secondary | ICD-10-CM | POA: Diagnosis not present

## 2016-01-11 DIAGNOSIS — J189 Pneumonia, unspecified organism: Secondary | ICD-10-CM | POA: Diagnosis not present

## 2016-01-11 DIAGNOSIS — E119 Type 2 diabetes mellitus without complications: Secondary | ICD-10-CM | POA: Diagnosis not present

## 2016-01-11 DIAGNOSIS — I10 Essential (primary) hypertension: Secondary | ICD-10-CM | POA: Diagnosis not present

## 2016-01-14 DIAGNOSIS — C7800 Secondary malignant neoplasm of unspecified lung: Secondary | ICD-10-CM | POA: Diagnosis not present

## 2016-01-14 DIAGNOSIS — Z7951 Long term (current) use of inhaled steroids: Secondary | ICD-10-CM | POA: Diagnosis not present

## 2016-01-14 DIAGNOSIS — Z9181 History of falling: Secondary | ICD-10-CM | POA: Diagnosis not present

## 2016-01-14 DIAGNOSIS — C641 Malignant neoplasm of right kidney, except renal pelvis: Secondary | ICD-10-CM | POA: Diagnosis not present

## 2016-01-14 DIAGNOSIS — I10 Essential (primary) hypertension: Secondary | ICD-10-CM | POA: Diagnosis not present

## 2016-01-14 DIAGNOSIS — F329 Major depressive disorder, single episode, unspecified: Secondary | ICD-10-CM | POA: Diagnosis not present

## 2016-01-14 DIAGNOSIS — J9809 Other diseases of bronchus, not elsewhere classified: Secondary | ICD-10-CM | POA: Diagnosis not present

## 2016-01-14 DIAGNOSIS — E785 Hyperlipidemia, unspecified: Secondary | ICD-10-CM | POA: Diagnosis not present

## 2016-01-14 DIAGNOSIS — J189 Pneumonia, unspecified organism: Secondary | ICD-10-CM | POA: Diagnosis not present

## 2016-01-14 DIAGNOSIS — E119 Type 2 diabetes mellitus without complications: Secondary | ICD-10-CM | POA: Diagnosis not present

## 2016-01-16 ENCOUNTER — Encounter: Payer: Self-pay | Admitting: Physician Assistant

## 2016-01-16 ENCOUNTER — Ambulatory Visit (INDEPENDENT_AMBULATORY_CARE_PROVIDER_SITE_OTHER): Payer: PPO | Admitting: Psychiatry

## 2016-01-16 ENCOUNTER — Ambulatory Visit (INDEPENDENT_AMBULATORY_CARE_PROVIDER_SITE_OTHER): Payer: PPO | Admitting: Physician Assistant

## 2016-01-16 VITALS — BP 122/86 | HR 87 | Temp 97.6°F | Resp 18 | Wt 164.0 lb

## 2016-01-16 DIAGNOSIS — Z7951 Long term (current) use of inhaled steroids: Secondary | ICD-10-CM | POA: Diagnosis not present

## 2016-01-16 DIAGNOSIS — J189 Pneumonia, unspecified organism: Secondary | ICD-10-CM

## 2016-01-16 DIAGNOSIS — F418 Other specified anxiety disorders: Secondary | ICD-10-CM | POA: Diagnosis not present

## 2016-01-16 DIAGNOSIS — F329 Major depressive disorder, single episode, unspecified: Secondary | ICD-10-CM | POA: Diagnosis not present

## 2016-01-16 DIAGNOSIS — C641 Malignant neoplasm of right kidney, except renal pelvis: Secondary | ICD-10-CM | POA: Diagnosis not present

## 2016-01-16 DIAGNOSIS — E119 Type 2 diabetes mellitus without complications: Secondary | ICD-10-CM | POA: Diagnosis not present

## 2016-01-16 DIAGNOSIS — Z85528 Personal history of other malignant neoplasm of kidney: Secondary | ICD-10-CM | POA: Diagnosis not present

## 2016-01-16 DIAGNOSIS — J9809 Other diseases of bronchus, not elsewhere classified: Secondary | ICD-10-CM | POA: Diagnosis not present

## 2016-01-16 DIAGNOSIS — E785 Hyperlipidemia, unspecified: Secondary | ICD-10-CM

## 2016-01-16 DIAGNOSIS — Z9181 History of falling: Secondary | ICD-10-CM | POA: Diagnosis not present

## 2016-01-16 DIAGNOSIS — C7800 Secondary malignant neoplasm of unspecified lung: Secondary | ICD-10-CM | POA: Diagnosis not present

## 2016-01-16 DIAGNOSIS — E039 Hypothyroidism, unspecified: Secondary | ICD-10-CM

## 2016-01-16 DIAGNOSIS — I10 Essential (primary) hypertension: Secondary | ICD-10-CM | POA: Diagnosis not present

## 2016-01-16 DIAGNOSIS — Z09 Encounter for follow-up examination after completed treatment for conditions other than malignant neoplasm: Secondary | ICD-10-CM | POA: Diagnosis not present

## 2016-01-16 NOTE — Progress Notes (Signed)
       THERAPIST PROGRESS NOTE  Session Time:       Thursday 01/16/2016 1:10 PM - 1:53 PM  Participation Level: Active  Behavioral Response: CasualAlert/anxious/tearful at times    Type of Therapy: Individual Therapy  Treatment Goals:    1.  Verbalize an increased understanding of the steps to grieving the loss is brought on by her medical condition.      2. Identify feelings associated with medical condition.      3. Learn and implement skills for managing stress.    Treatment Goals addressed: 1,2.3   Interventions: ACT, Supportive  Summary: [name] [name] is a 66 y.o. female who presents with symptoms of depression and anxiety that have been intermittent for several years that have been well controlled until patient was diagnosed with cancer in 2013. Symptoms have worsened in recent months as     patient reports constantly thinking about her diagnoses and fears cancer will spread. She has been given a prognosis of 2-4 years to live. Patient reports taking chemotherapy pills and reports extreme fatigue. Patient also experiences depression, anxiety, and crying spells  Patient last was seen about 4  weeks ago. She reports continued medical issues and was hospitalized at [facility] due to having pneumonia prior to Christmas. She was discharged before the holidays but patient reports being unable to enjoy the holidays due to feeling sick and being tired. She also has started a new chemo medication and reports staying nauseated due to the medicine.  She reports increased depressed mood, irritability, and crying spells. She reports increased arguing with husband. She complains children don't really talk to her. Her voice remains hoarse and patient reports increased difficulty competing sentences.   Suicidal/Homicidal: No    Therapist Response: reviewed symptoms, facilitated patient's expression of feelings, assisted patient identify and replace negative thoughts supporting depression and anxiety  with healthy alternatives,    Plan: Patient agrees to return for an appointment in 3-4 weeks.    Diagnosis: Axis I: Depressive Disorder NOS    Axis II: Deferred    Doyne Micke, LCSW 01/16/2016
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
## 2016-01-20 DIAGNOSIS — Z7951 Long term (current) use of inhaled steroids: Secondary | ICD-10-CM | POA: Diagnosis not present

## 2016-01-20 DIAGNOSIS — E785 Hyperlipidemia, unspecified: Secondary | ICD-10-CM | POA: Diagnosis not present

## 2016-01-20 DIAGNOSIS — C7800 Secondary malignant neoplasm of unspecified lung: Secondary | ICD-10-CM | POA: Diagnosis not present

## 2016-01-20 DIAGNOSIS — J9809 Other diseases of bronchus, not elsewhere classified: Secondary | ICD-10-CM | POA: Diagnosis not present

## 2016-01-20 DIAGNOSIS — F329 Major depressive disorder, single episode, unspecified: Secondary | ICD-10-CM | POA: Diagnosis not present

## 2016-01-20 DIAGNOSIS — I10 Essential (primary) hypertension: Secondary | ICD-10-CM | POA: Diagnosis not present

## 2016-01-20 DIAGNOSIS — C641 Malignant neoplasm of right kidney, except renal pelvis: Secondary | ICD-10-CM | POA: Diagnosis not present

## 2016-01-20 DIAGNOSIS — J189 Pneumonia, unspecified organism: Secondary | ICD-10-CM | POA: Diagnosis not present

## 2016-01-20 DIAGNOSIS — E119 Type 2 diabetes mellitus without complications: Secondary | ICD-10-CM | POA: Diagnosis not present

## 2016-01-20 DIAGNOSIS — Z9181 History of falling: Secondary | ICD-10-CM | POA: Diagnosis not present

## 2016-01-21 DIAGNOSIS — E785 Hyperlipidemia, unspecified: Secondary | ICD-10-CM | POA: Diagnosis not present

## 2016-01-21 DIAGNOSIS — J189 Pneumonia, unspecified organism: Secondary | ICD-10-CM | POA: Diagnosis not present

## 2016-01-21 DIAGNOSIS — F329 Major depressive disorder, single episode, unspecified: Secondary | ICD-10-CM | POA: Diagnosis not present

## 2016-01-21 DIAGNOSIS — Z9181 History of falling: Secondary | ICD-10-CM | POA: Diagnosis not present

## 2016-01-21 DIAGNOSIS — Z7951 Long term (current) use of inhaled steroids: Secondary | ICD-10-CM | POA: Diagnosis not present

## 2016-01-21 DIAGNOSIS — E119 Type 2 diabetes mellitus without complications: Secondary | ICD-10-CM | POA: Diagnosis not present

## 2016-01-21 DIAGNOSIS — C7800 Secondary malignant neoplasm of unspecified lung: Secondary | ICD-10-CM | POA: Diagnosis not present

## 2016-01-21 DIAGNOSIS — C641 Malignant neoplasm of right kidney, except renal pelvis: Secondary | ICD-10-CM | POA: Diagnosis not present

## 2016-01-21 DIAGNOSIS — J9809 Other diseases of bronchus, not elsewhere classified: Secondary | ICD-10-CM | POA: Diagnosis not present

## 2016-01-21 DIAGNOSIS — I10 Essential (primary) hypertension: Secondary | ICD-10-CM | POA: Diagnosis not present

## 2016-01-23 DIAGNOSIS — Z7951 Long term (current) use of inhaled steroids: Secondary | ICD-10-CM | POA: Diagnosis not present

## 2016-01-23 DIAGNOSIS — E785 Hyperlipidemia, unspecified: Secondary | ICD-10-CM | POA: Diagnosis not present

## 2016-01-23 DIAGNOSIS — J9809 Other diseases of bronchus, not elsewhere classified: Secondary | ICD-10-CM | POA: Diagnosis not present

## 2016-01-23 DIAGNOSIS — E119 Type 2 diabetes mellitus without complications: Secondary | ICD-10-CM | POA: Diagnosis not present

## 2016-01-23 DIAGNOSIS — J189 Pneumonia, unspecified organism: Secondary | ICD-10-CM | POA: Diagnosis not present

## 2016-01-23 DIAGNOSIS — F329 Major depressive disorder, single episode, unspecified: Secondary | ICD-10-CM | POA: Diagnosis not present

## 2016-01-23 DIAGNOSIS — C7800 Secondary malignant neoplasm of unspecified lung: Secondary | ICD-10-CM | POA: Diagnosis not present

## 2016-01-23 DIAGNOSIS — C641 Malignant neoplasm of right kidney, except renal pelvis: Secondary | ICD-10-CM | POA: Diagnosis not present

## 2016-01-23 DIAGNOSIS — Z9181 History of falling: Secondary | ICD-10-CM | POA: Diagnosis not present

## 2016-01-23 DIAGNOSIS — I10 Essential (primary) hypertension: Secondary | ICD-10-CM | POA: Diagnosis not present

## 2016-01-28 DIAGNOSIS — Z6826 Body mass index (BMI) 26.0-26.9, adult: Secondary | ICD-10-CM | POA: Diagnosis not present

## 2016-01-28 DIAGNOSIS — R1310 Dysphagia, unspecified: Secondary | ICD-10-CM | POA: Diagnosis not present

## 2016-01-29 ENCOUNTER — Ambulatory Visit (HOSPITAL_COMMUNITY): Payer: Self-pay | Admitting: Hematology & Oncology

## 2016-01-29 ENCOUNTER — Other Ambulatory Visit (HOSPITAL_COMMUNITY): Payer: Self-pay

## 2016-01-30 ENCOUNTER — Other Ambulatory Visit (HOSPITAL_COMMUNITY): Payer: Self-pay | Admitting: Oncology

## 2016-01-31 ENCOUNTER — Other Ambulatory Visit (HOSPITAL_COMMUNITY): Payer: Self-pay | Admitting: Hematology & Oncology

## 2016-02-04 ENCOUNTER — Other Ambulatory Visit (HOSPITAL_COMMUNITY): Payer: Self-pay | Admitting: Hematology & Oncology

## 2016-02-04 ENCOUNTER — Telehealth (HOSPITAL_COMMUNITY): Payer: Self-pay

## 2016-02-04 ENCOUNTER — Other Ambulatory Visit (HOSPITAL_COMMUNITY): Payer: Self-pay | Admitting: Oncology

## 2016-02-04 ENCOUNTER — Encounter: Payer: Self-pay | Admitting: *Deleted

## 2016-02-04 ENCOUNTER — Encounter (HOSPITAL_COMMUNITY): Payer: PPO | Attending: Hematology & Oncology | Admitting: Hematology & Oncology

## 2016-02-04 ENCOUNTER — Encounter (HOSPITAL_COMMUNITY): Payer: Self-pay | Admitting: Hematology & Oncology

## 2016-02-04 ENCOUNTER — Encounter (HOSPITAL_COMMUNITY): Payer: PPO

## 2016-02-04 ENCOUNTER — Other Ambulatory Visit (HOSPITAL_COMMUNITY): Payer: Self-pay | Admitting: Speech Pathology

## 2016-02-04 ENCOUNTER — Ambulatory Visit (HOSPITAL_COMMUNITY): Payer: PPO

## 2016-02-04 VITALS — BP 130/85 | HR 80 | Temp 97.6°F | Resp 22 | Wt 158.8 lb

## 2016-02-04 DIAGNOSIS — B372 Candidiasis of skin and nail: Secondary | ICD-10-CM | POA: Diagnosis not present

## 2016-02-04 DIAGNOSIS — L270 Generalized skin eruption due to drugs and medicaments taken internally: Secondary | ICD-10-CM

## 2016-02-04 DIAGNOSIS — C641 Malignant neoplasm of right kidney, except renal pelvis: Secondary | ICD-10-CM | POA: Diagnosis not present

## 2016-02-04 DIAGNOSIS — C799 Secondary malignant neoplasm of unspecified site: Principal | ICD-10-CM

## 2016-02-04 DIAGNOSIS — E876 Hypokalemia: Secondary | ICD-10-CM

## 2016-02-04 DIAGNOSIS — R1312 Dysphagia, oropharyngeal phase: Secondary | ICD-10-CM

## 2016-02-04 DIAGNOSIS — R1310 Dysphagia, unspecified: Secondary | ICD-10-CM | POA: Diagnosis not present

## 2016-02-04 DIAGNOSIS — R4182 Altered mental status, unspecified: Secondary | ICD-10-CM | POA: Diagnosis not present

## 2016-02-04 DIAGNOSIS — R944 Abnormal results of kidney function studies: Secondary | ICD-10-CM | POA: Diagnosis not present

## 2016-02-04 DIAGNOSIS — C649 Malignant neoplasm of unspecified kidney, except renal pelvis: Secondary | ICD-10-CM

## 2016-02-04 DIAGNOSIS — F329 Major depressive disorder, single episode, unspecified: Secondary | ICD-10-CM

## 2016-02-04 DIAGNOSIS — B379 Candidiasis, unspecified: Secondary | ICD-10-CM

## 2016-02-04 LAB — CBC WITH DIFFERENTIAL/PLATELET
Basophils Absolute: 0 10*3/uL (ref 0.0–0.1)
Basophils Relative: 1 %
Eosinophils Absolute: 0.5 10*3/uL (ref 0.0–0.7)
Eosinophils Relative: 7 %
HEMATOCRIT: 44.3 % (ref 36.0–46.0)
HEMOGLOBIN: 14.6 g/dL (ref 12.0–15.0)
LYMPHS ABS: 2.2 10*3/uL (ref 0.7–4.0)
LYMPHS PCT: 33 %
MCH: 30.9 pg (ref 26.0–34.0)
MCHC: 33 g/dL (ref 30.0–36.0)
MCV: 93.9 fL (ref 78.0–100.0)
MONO ABS: 0.3 10*3/uL (ref 0.1–1.0)
MONOS PCT: 4 %
NEUTROS ABS: 3.7 10*3/uL (ref 1.7–7.7)
NEUTROS PCT: 55 %
Platelets: 144 10*3/uL — ABNORMAL LOW (ref 150–400)
RBC: 4.72 MIL/uL (ref 3.87–5.11)
RDW: 17 % — AB (ref 11.5–15.5)
WBC: 6.8 10*3/uL (ref 4.0–10.5)

## 2016-02-04 LAB — COMPREHENSIVE METABOLIC PANEL
ALBUMIN: 3.7 g/dL (ref 3.5–5.0)
ALK PHOS: 100 U/L (ref 38–126)
ALT: 36 U/L (ref 14–54)
ANION GAP: 9 (ref 5–15)
AST: 47 U/L — ABNORMAL HIGH (ref 15–41)
BILIRUBIN TOTAL: 1 mg/dL (ref 0.3–1.2)
BUN: 18 mg/dL (ref 6–20)
CALCIUM: 9.1 mg/dL (ref 8.9–10.3)
CO2: 21 mmol/L — ABNORMAL LOW (ref 22–32)
Chloride: 111 mmol/L (ref 101–111)
Creatinine, Ser: 1.37 mg/dL — ABNORMAL HIGH (ref 0.44–1.00)
GFR calc Af Amer: 45 mL/min — ABNORMAL LOW (ref >60)
GFR, EST NON AFRICAN AMERICAN: 39 mL/min — AB (ref >60)
GLUCOSE: 84 mg/dL (ref 65–99)
POTASSIUM: 4.4 mmol/L (ref 3.5–5.1)
Sodium: 141 mmol/L (ref 135–145)
TOTAL PROTEIN: 7.2 g/dL (ref 6.5–8.1)

## 2016-02-04 MED ORDER — VALACYCLOVIR HCL 1 G PO TABS: 1000.0000 mg | ORAL_TABLET | Freq: Two times a day (BID) | ORAL | 0 refills | Status: AC

## 2016-02-04 MED ORDER — NYSTATIN 100000 UNIT/GM EX POWD: Freq: Four times a day (QID) | CUTANEOUS | 0 refills | Status: AC

## 2016-02-04 MED ORDER — FLUCONAZOLE 150 MG PO TABS: 150.0000 mg | ORAL_TABLET | Freq: Every day | ORAL | 0 refills | Status: AC

## 2016-02-04 NOTE — Patient Instructions (Addendum)
[place] at [facility] Discharge Instructions  RECOMMENDATIONS MADE BY THE CONSULTANT AND ANY TEST RESULTS WILL BE SENT TO YOUR REFERRING PHYSICIAN.  You saw Dr.Penland today. Prescriptions for valtrex 1 gram take twice daily x 10 days, diflucan '150mg'$  take daily x 7 days and nystatin powder apply up to four times daily to skin (for yeast) all were called in to [facility]. Follow up tomorrow for MRI and then Thursday for swallow study See Amy at checkout for appointments.  Thank you for choosing [place] at [facility] to provide your oncology and hematology care.  To afford each patient quality time with our provider, please arrive at least 15 minutes before your scheduled appointment time.    If you have a lab appointment with the [facility] please come in thru the  Main Entrance and check in at the main information desk  You need to re-schedule your appointment should you arrive 10 or more minutes late.  We strive to give you quality time with our providers, and arriving late affects you and other patients whose appointments are after yours.  Also, if you no show three or more times for appointments you may be dismissed from the clinic at the providers discretion.     Again, thank you for choosing [facility].  Our hope is that these requests will decrease the amount of time that you wait before being seen by our physicians.       _____________________________________________________________  Should you have questions after your visit to [facility], please contact our office at [phone] between the hours of 8:30 a.m. and 4:30 p.m.  Voicemails left after 4:30 p.m. will not be returned until the following business day.  For prescription refill requests, have your pharmacy contact our office.       Resources For Cancer Patients and their Caregivers ? American Cancer Society: Can assist with  transportation, wigs, general needs, runs Look Good Feel Better.        (781) 213-0213 ? Cancer Care: Provides financial assistance, online support groups, medication/co-pay assistance.  1-800-813-HOPE 586-293-8591) ? [facility] Assists [place] cancer patients and their families through emotional , educational and financial support.  413-129-8569 ? [place] DSS Where to apply for food stamps, Medicaid and utility assistance. (912)248-1682 ? [facility]: Transportation to medical appointments. (959) 352-0820 ? Social Security Administration: May apply for disability if have a Stage IV cancer. 732-392-4007 769-600-5978 ? [employer], Disability and Transit Services: Assists with nutrition, care and transit needs. [place] Support Programs: '@10RELATIVEDAYS'$ @ > Cancer Support Group  2nd Tuesday of the month 1pm-2pm, Journey Room  > Creative Journey  3rd Tuesday of the month 1130am-1pm, Journey Room  > Look Good Feel Better  1st Wednesday of the month 10am-12 noon, Journey Room (Call [place] to register [phone])

## 2016-02-04 NOTE — Progress Notes (Signed)
[place] Clinical Social Work  Clinical Social Work was referred by rounding and for re-assessment of psychosocial needs due to support needs in the past. [place] representative met with patient and her husband to offer support and assess for needs.  Pt shared her ongoing physical concerns/needs. CSW provided supportive listening. Pt tearful, sad she has not been to attend [facility] support group due to her physical concerns. CSW listened to concerns of both pt and her husband. Pt continues to see her therapist and CSW encouraged her to reach out to this CSW as needed, attend group when able. CSW will continue to follow, but feels an advanced life planning discussion may be very helpful for pt and husband going forward.     Clinical Social Work interventions: Supportive listening  [place], [place] Tuesdays   [phone]

## 2016-02-04 NOTE — Telephone Encounter (Signed)
Spoke with husband and let him know prescriptions were sent to [facility] and how to take them.
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
## 2016-02-04 NOTE — Telephone Encounter (Signed)
-----   Message from Donetta Potts, RN sent at 02/04/2016  1:41 PM EST ----- I tried to call patient to let them know I called in 3 prescriptions and they had not returned home yet.  Will you attempt to contact her and let her know that I called in nystatin powder to apply under breasts, diflucan '150mg'$  take daily x 7 days and then valtrex 1 gram every 12 hours for 10 days.    Thank you ma'am.

## 2016-02-05 ENCOUNTER — Ambulatory Visit (HOSPITAL_COMMUNITY)
Admission: RE | Admit: 2016-02-05 | Discharge: 2016-02-05 | Disposition: A | Discharge status: Home / Self Care | Payer: PPO | Source: Ambulatory Visit | Attending: Hematology & Oncology | Admitting: Hematology & Oncology

## 2016-02-05 ENCOUNTER — Other Ambulatory Visit (HOSPITAL_COMMUNITY): Payer: Self-pay | Admitting: Specialist

## 2016-02-05 DIAGNOSIS — C641 Malignant neoplasm of right kidney, except renal pelvis: Secondary | ICD-10-CM | POA: Diagnosis not present

## 2016-02-05 DIAGNOSIS — I10 Essential (primary) hypertension: Secondary | ICD-10-CM | POA: Diagnosis not present

## 2016-02-05 DIAGNOSIS — R51 Headache: Secondary | ICD-10-CM | POA: Diagnosis not present

## 2016-02-05 DIAGNOSIS — C799 Secondary malignant neoplasm of unspecified site: Secondary | ICD-10-CM

## 2016-02-05 DIAGNOSIS — C649 Malignant neoplasm of unspecified kidney, except renal pelvis: Secondary | ICD-10-CM | POA: Insufficient documentation

## 2016-02-05 DIAGNOSIS — R4182 Altered mental status, unspecified: Secondary | ICD-10-CM | POA: Diagnosis not present

## 2016-02-05 MED ORDER — GADOBENATE DIMEGLUMINE 529 MG/ML IV SOLN
10.0000 mL | Freq: Once | INTRAVENOUS | Status: AC | PRN
  Administered 2016-02-05: 7 mL via INTRAVENOUS

## 2016-02-05 MED ORDER — GADOBENATE DIMEGLUMINE 529 MG/ML IV SOLN: 15.0000 mL | Freq: Once | INTRAVENOUS | Status: DC | PRN

## 2016-02-06 ENCOUNTER — Encounter (HOSPITAL_COMMUNITY): Payer: Self-pay | Admitting: Speech Pathology

## 2016-02-06 ENCOUNTER — Telehealth (HOSPITAL_COMMUNITY): Payer: Self-pay | Admitting: *Deleted

## 2016-02-06 ENCOUNTER — Ambulatory Visit (HOSPITAL_COMMUNITY): Payer: PPO | Attending: Hematology & Oncology | Admitting: Speech Pathology

## 2016-02-06 ENCOUNTER — Ambulatory Visit (HOSPITAL_COMMUNITY)
Admission: RE | Admit: 2016-02-06 | Discharge: 2016-02-06 | Disposition: A | Discharge status: Home / Self Care | Payer: PPO | Source: Ambulatory Visit | Attending: Oncology | Admitting: Oncology

## 2016-02-06 DIAGNOSIS — M5136 Other intervertebral disc degeneration, lumbar region: Secondary | ICD-10-CM | POA: Diagnosis not present

## 2016-02-06 DIAGNOSIS — F329 Major depressive disorder, single episode, unspecified: Secondary | ICD-10-CM | POA: Diagnosis not present

## 2016-02-06 DIAGNOSIS — I10 Essential (primary) hypertension: Secondary | ICD-10-CM | POA: Diagnosis not present

## 2016-02-06 DIAGNOSIS — R1312 Dysphagia, oropharyngeal phase: Secondary | ICD-10-CM

## 2016-02-06 DIAGNOSIS — K219 Gastro-esophageal reflux disease without esophagitis: Secondary | ICD-10-CM | POA: Diagnosis not present

## 2016-02-06 DIAGNOSIS — M1990 Unspecified osteoarthritis, unspecified site: Secondary | ICD-10-CM | POA: Diagnosis not present

## 2016-02-06 DIAGNOSIS — E119 Type 2 diabetes mellitus without complications: Secondary | ICD-10-CM | POA: Insufficient documentation

## 2016-02-06 DIAGNOSIS — J449 Chronic obstructive pulmonary disease, unspecified: Secondary | ICD-10-CM | POA: Insufficient documentation

## 2016-02-06 DIAGNOSIS — F419 Anxiety disorder, unspecified: Secondary | ICD-10-CM | POA: Insufficient documentation

## 2016-02-06 DIAGNOSIS — Z85528 Personal history of other malignant neoplasm of kidney: Secondary | ICD-10-CM | POA: Diagnosis not present

## 2016-02-06 DIAGNOSIS — E7800 Pure hypercholesterolemia, unspecified: Secondary | ICD-10-CM | POA: Insufficient documentation

## 2016-02-06 DIAGNOSIS — M797 Fibromyalgia: Secondary | ICD-10-CM | POA: Insufficient documentation

## 2016-02-06 DIAGNOSIS — K760 Fatty (change of) liver, not elsewhere classified: Secondary | ICD-10-CM | POA: Insufficient documentation

## 2016-02-06 DIAGNOSIS — G4730 Sleep apnea, unspecified: Secondary | ICD-10-CM | POA: Diagnosis not present
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
## 2016-02-06 NOTE — Telephone Encounter (Signed)
Tom, What can I tell her. Meredeth Ide

## 2016-02-07 NOTE — Telephone Encounter (Signed)
Notified patient what [name] [name], said. Instructed patient that someone from our office or Dr. Deeann Saint will call with appointment. She verbalized understanding.

## 2016-02-07 NOTE — Telephone Encounter (Signed)
Discussed with neuroradiology.  MRI results do not explain patient's issues with swallowing.  Per swallow study, we need to get her to see an ENT.  Please refer to Dr. Benjamine Mola for evaluation.  Robynn Pane, PA-C 02/07/2016 3:58 PM

## 2016-02-08 ENCOUNTER — Encounter (HOSPITAL_COMMUNITY): Payer: Self-pay | Admitting: Lab

## 2016-02-08 NOTE — Progress Notes (Unsigned)
Referral sent to Dr Benjamine Mola, records faxed on 1/26.  They will contact patient

## 2016-02-11 ENCOUNTER — Emergency Department (HOSPITAL_COMMUNITY)
Admission: EM | Admit: 2016-02-11 | Discharge: 2016-02-11 | Disposition: A | Discharge status: Home / Self Care | Payer: PPO | Attending: Emergency Medicine | Admitting: Emergency Medicine

## 2016-02-11 ENCOUNTER — Emergency Department (HOSPITAL_COMMUNITY): Payer: PPO

## 2016-02-11 ENCOUNTER — Encounter (HOSPITAL_COMMUNITY): Payer: Self-pay

## 2016-02-11 ENCOUNTER — Other Ambulatory Visit: Payer: Self-pay

## 2016-02-11 ENCOUNTER — Encounter: Payer: Self-pay | Admitting: Internal Medicine

## 2016-02-11 ENCOUNTER — Telehealth (HOSPITAL_COMMUNITY): Payer: Self-pay | Admitting: *Deleted

## 2016-02-11 DIAGNOSIS — J449 Chronic obstructive pulmonary disease, unspecified: Secondary | ICD-10-CM | POA: Insufficient documentation

## 2016-02-11 DIAGNOSIS — E119 Type 2 diabetes mellitus without complications: Secondary | ICD-10-CM | POA: Diagnosis not present

## 2016-02-11 DIAGNOSIS — R112 Nausea with vomiting, unspecified: Secondary | ICD-10-CM

## 2016-02-11 DIAGNOSIS — Z79899 Other long term (current) drug therapy: Secondary | ICD-10-CM | POA: Diagnosis not present

## 2016-02-11 DIAGNOSIS — R8299 Other abnormal findings in urine: Secondary | ICD-10-CM | POA: Diagnosis not present

## 2016-02-11 DIAGNOSIS — I10 Essential (primary) hypertension: Secondary | ICD-10-CM | POA: Insufficient documentation

## 2016-02-11 LAB — URINALYSIS, ROUTINE W REFLEX MICROSCOPIC
Bilirubin Urine: NEGATIVE
GLUCOSE, UA: NEGATIVE mg/dL
HGB URINE DIPSTICK: NEGATIVE
KETONES UR: 5 mg/dL — AB
Leukocytes, UA: NEGATIVE
Nitrite: NEGATIVE
PROTEIN: NEGATIVE mg/dL
Specific Gravity, Urine: 1.023 (ref 1.005–1.030)
pH: 5 (ref 5.0–8.0)

## 2016-02-11 LAB — CBC WITH DIFFERENTIAL/PLATELET
BASOS ABS: 0.1 10*3/uL (ref 0.0–0.1)
Basophils Relative: 1 %
EOS PCT: 3 %
Eosinophils Absolute: 0.2 10*3/uL (ref 0.0–0.7)
HCT: 44.3 % (ref 36.0–46.0)
Hemoglobin: 14.6 g/dL (ref 12.0–15.0)
LYMPHS PCT: 30 %
Lymphs Abs: 1.5 10*3/uL (ref 0.7–4.0)
MCH: 31.2 pg (ref 26.0–34.0)
MCHC: 33 g/dL (ref 30.0–36.0)
MCV: 94.7 fL (ref 78.0–100.0)
MONO ABS: 0.2 10*3/uL (ref 0.1–1.0)
MONOS PCT: 3 %
Neutro Abs: 3.1 10*3/uL (ref 1.7–7.7)
Neutrophils Relative %: 63 %
PLATELETS: 149 10*3/uL — AB (ref 150–400)
RBC: 4.68 MIL/uL (ref 3.87–5.11)
RDW: 17.6 % — AB (ref 11.5–15.5)
WBC: 5 10*3/uL (ref 4.0–10.5)

## 2016-02-11 LAB — COMPREHENSIVE METABOLIC PANEL
ALBUMIN: 4 g/dL (ref 3.5–5.0)
ALK PHOS: 98 U/L (ref 38–126)
ALT: 30 U/L (ref 14–54)
AST: 40 U/L (ref 15–41)
Anion gap: 9 (ref 5–15)
BILIRUBIN TOTAL: 0.7 mg/dL (ref 0.3–1.2)
BUN: 20 mg/dL (ref 6–20)
CALCIUM: 9.9 mg/dL (ref 8.9–10.3)
CO2: 20 mmol/L — ABNORMAL LOW (ref 22–32)
Chloride: 112 mmol/L — ABNORMAL HIGH (ref 101–111)
Creatinine, Ser: 1.53 mg/dL — ABNORMAL HIGH (ref 0.44–1.00)
GFR calc Af Amer: 40 mL/min — ABNORMAL LOW (ref >60)
GFR calc non Af Amer: 34 mL/min — ABNORMAL LOW (ref >60)
GLUCOSE: 114 mg/dL — AB (ref 65–99)
Potassium: 5.3 mmol/L — ABNORMAL HIGH (ref 3.5–5.1)
Sodium: 141 mmol/L (ref 135–145)
TOTAL PROTEIN: 7.6 g/dL (ref 6.5–8.1)

## 2016-02-11 LAB — TROPONIN I: Troponin I: <0.03 ng/mL (ref <0.03)

## 2016-02-11 LAB — LIPASE, BLOOD: Lipase: 30 U/L (ref 11–51)

## 2016-02-11 LAB — LACTIC ACID, PLASMA: Lactic Acid, Venous: 1.7 mmol/L (ref 0.5–1.9)

## 2016-02-11 MED ORDER — SODIUM CHLORIDE 0.9 % IV SOLN: INTRAVENOUS | Status: DC

## 2016-02-11 MED ORDER — HEPARIN SOD (PORK) LOCK FLUSH 100 UNIT/ML IV SOLN
INTRAVENOUS | Status: AC
  Filled 2016-02-11: qty 5

## 2016-02-11 MED ORDER — ONDANSETRON HCL 4 MG/2ML IJ SOLN
4.0000 mg | INTRAMUSCULAR | Status: DC | PRN
  Administered 2016-02-11: 4 mg via INTRAVENOUS
  Filled 2016-02-11: qty 2

## 2016-02-11 MED ORDER — SODIUM CHLORIDE 0.9 % IV BOLUS (SEPSIS)
500.0000 mL | Freq: Once | INTRAVENOUS | Status: AC
  Administered 2016-02-11: 500 mL via INTRAVENOUS

## 2016-02-11 MED ORDER — ONDANSETRON 4 MG PO TBDP: 4.0000 mg | ORAL_TABLET | Freq: Three times a day (TID) | ORAL | 0 refills | Status: AC | PRN

## 2016-02-11 NOTE — ED Notes (Signed)
Pt given water to drink. 
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
## 2016-02-11 NOTE — ED Triage Notes (Signed)
Pt  Reports that she has not been able to eat anything for 3 days due to nausea. Has hx of renal cancer and last treatment 02/04/16. Having difficulty keeping zofran down

## 2016-02-11 NOTE — Telephone Encounter (Signed)
Spoke with patient's husband via telephone, who reports that patient has been vomiting all day with no relief with the zofran she has prescribed.  Patient has been unable to keep down any fluids.    Spoke with provider and she advised for patient to go to the emergency room for fluids and antiemetics.  Called back and spoke to patient's husband and he verbalizes understanding and states that he will take her to the ER now for fluids.

## 2016-02-11 NOTE — ED Notes (Signed)
Pt refused in and out cath, and asked to ambulate to the bathroom. Pt attempted to provide urine sample but missed urine cup. This tech will return to take pt to the bathroom and collect sample with the aide of a nuns cap.

## 2016-02-11 NOTE — Discharge Instructions (Signed)
Take the prescription as directed.  Increase your fluid intake (ie:  Gatoraide) for the next few days.  Eat a bland diet and advance to your regular diet slowly as you can tolerate it.   Avoid full strength juices, as well as milk and milk products until your diarrhea has resolved.   Call your regular medical doctor and your Oncologist tomorrow to schedule a follow up appointment in the next 2 days.  Return to the Emergency Department immediately sooner if worsening.

## 2016-02-13 ENCOUNTER — Encounter (HOSPITAL_COMMUNITY)
Admission: RE | Admit: 2016-02-13 | Discharge: 2016-02-13 | Disposition: A | Discharge status: Home / Self Care | Payer: PPO | Source: Ambulatory Visit | Attending: Physician Assistant | Admitting: Physician Assistant

## 2016-02-13 ENCOUNTER — Ambulatory Visit (INDEPENDENT_AMBULATORY_CARE_PROVIDER_SITE_OTHER): Payer: PPO | Admitting: Physician Assistant

## 2016-02-13 ENCOUNTER — Ambulatory Visit (HOSPITAL_COMMUNITY): Payer: PPO | Admitting: Psychiatry

## 2016-02-13 ENCOUNTER — Encounter: Payer: Self-pay | Admitting: Physician Assistant

## 2016-02-13 VITALS — BP 124/86 | HR 91 | Resp 16 | Wt 153.6 lb

## 2016-02-13 DIAGNOSIS — G43A1 Cyclical vomiting, intractable: Secondary | ICD-10-CM | POA: Diagnosis not present

## 2016-02-13 DIAGNOSIS — R112 Nausea with vomiting, unspecified: Secondary | ICD-10-CM | POA: Diagnosis not present

## 2016-02-13 DIAGNOSIS — C649 Malignant neoplasm of unspecified kidney, except renal pelvis: Secondary | ICD-10-CM | POA: Insufficient documentation

## 2016-02-13 DIAGNOSIS — E860 Dehydration: Secondary | ICD-10-CM | POA: Insufficient documentation

## 2016-02-13 DIAGNOSIS — R1115 Cyclical vomiting syndrome unrelated to migraine: Secondary | ICD-10-CM

## 2016-02-13 LAB — URINE CULTURE: CULTURE: NO GROWTH

## 2016-02-13 MED ORDER — SODIUM CHLORIDE 0.9 % IV SOLN
INTRAVENOUS | Status: DC
  Administered 2016-02-13: 1000 mL via INTRAVENOUS

## 2016-02-13 MED ORDER — SODIUM CHLORIDE 0.9 % IV SOLN
INTRAVENOUS | Status: AC
  Administered 2016-02-13: 1000 mL via INTRAVENOUS

## 2016-02-13 MED ORDER — HEPARIN SOD (PORK) LOCK FLUSH 100 UNIT/ML IV SOLN
INTRAVENOUS | Status: AC
  Filled 2016-02-13: qty 5
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
## 2016-02-14 ENCOUNTER — Telehealth (HOSPITAL_COMMUNITY): Payer: Self-pay

## 2016-02-14 ENCOUNTER — Encounter (HOSPITAL_COMMUNITY): Payer: Self-pay | Admitting: Hematology & Oncology

## 2016-02-14 NOTE — Progress Notes (Signed)
2000 ml fluid bolus completed. Porta-cath flushed with heparin 500 units and then deacessed. drsg to site. Tolerated well.

## 2016-02-14 NOTE — Telephone Encounter (Signed)
Patient called and was extremely hard to understand over the phone. Her speech was almost slurring. Myself and RN navigator both listened to her. As far as we can tell she is complaining of being sick and nauseated since stopping chemo. She did say her breathing was better. . We were concerned about the change in her speech. Instructed patient to go to ER with understanding verbalized.

## 2016-02-17 DIAGNOSIS — R0602 Shortness of breath: Secondary | ICD-10-CM | POA: Diagnosis not present

## 2016-02-17 DIAGNOSIS — C7800 Secondary malignant neoplasm of unspecified lung: Secondary | ICD-10-CM | POA: Diagnosis not present

## 2016-02-17 DIAGNOSIS — C649 Malignant neoplasm of unspecified kidney, except renal pelvis: Secondary | ICD-10-CM | POA: Diagnosis not present

## 2016-02-17 DIAGNOSIS — Z9221 Personal history of antineoplastic chemotherapy: Secondary | ICD-10-CM | POA: Diagnosis not present

## 2016-02-19 ENCOUNTER — Encounter (HOSPITAL_COMMUNITY): Payer: Self-pay | Admitting: Oncology

## 2016-02-19 ENCOUNTER — Encounter (HOSPITAL_COMMUNITY): Payer: Self-pay

## 2016-02-19 ENCOUNTER — Encounter (HOSPITAL_COMMUNITY): Payer: PPO | Attending: Hematology and Oncology | Admitting: Oncology

## 2016-02-19 VITALS — BP 135/97 | HR 96 | Temp 97.5°F | Resp 19 | Wt 139.3 lb

## 2016-02-19 DIAGNOSIS — C799 Secondary malignant neoplasm of unspecified site: Secondary | ICD-10-CM

## 2016-02-19 DIAGNOSIS — C7800 Secondary malignant neoplasm of unspecified lung: Secondary | ICD-10-CM

## 2016-02-19 DIAGNOSIS — C649 Malignant neoplasm of unspecified kidney, except renal pelvis: Secondary | ICD-10-CM

## 2016-02-19 DIAGNOSIS — R5383 Other fatigue: Secondary | ICD-10-CM | POA: Insufficient documentation

## 2016-02-19 DIAGNOSIS — C641 Malignant neoplasm of right kidney, except renal pelvis: Secondary | ICD-10-CM

## 2016-02-19 DIAGNOSIS — R634 Abnormal weight loss: Secondary | ICD-10-CM

## 2016-02-19 DIAGNOSIS — K219 Gastro-esophageal reflux disease without esophagitis: Secondary | ICD-10-CM | POA: Insufficient documentation

## 2016-02-19 DIAGNOSIS — G8929 Other chronic pain: Secondary | ICD-10-CM

## 2016-02-19 DIAGNOSIS — R627 Adult failure to thrive: Secondary | ICD-10-CM

## 2016-02-19 DIAGNOSIS — R918 Other nonspecific abnormal finding of lung field: Secondary | ICD-10-CM | POA: Insufficient documentation

## 2016-02-19 MED ORDER — OXYCODONE HCL 5 MG PO TABS: 5.0000 mg | ORAL_TABLET | Freq: Four times a day (QID) | ORAL | 0 refills | Status: AC | PRN

## 2016-02-19 MED ORDER — MORPHINE SULFATE ER 15 MG PO TBCR: 15.0000 mg | EXTENDED_RELEASE_TABLET | Freq: Two times a day (BID) | ORAL | 0 refills | Status: AC

## 2016-02-19 NOTE — Patient Instructions (Signed)
[place] at [facility] Discharge Instructions  RECOMMENDATIONS MADE BY THE CONSULTANT AND ANY TEST RESULTS WILL BE SENT TO YOUR REFERRING PHYSICIAN.  You were seen today by Manning Charity Take the Morphine twice daily with or without any pain. Take the oxycodone for break through pain. Follow up in 2 weeks See Amy up front for appointments   Thank you for choosing [place] at [facility] to provide your oncology and hematology care.  To afford each patient quality time with our provider, please arrive at least 15 minutes before your scheduled appointment time.    If you have a lab appointment with the [facility] please come in thru the  Main Entrance and check in at the main information desk  You need to re-schedule your appointment should you arrive 10 or more minutes late.  We strive to give you quality time with our providers, and arriving late affects you and other patients whose appointments are after yours.  Also, if you no show three or more times for appointments you may be dismissed from the clinic at the providers discretion.     Again, thank you for choosing [facility].  Our hope is that these requests will decrease the amount of time that you wait before being seen by our physicians.       _____________________________________________________________  Should you have questions after your visit to [facility], please contact our office at [phone] between the hours of 8:30 a.m. and 4:30 p.m.  Voicemails left after 4:30 p.m. will not be returned until the following business day.  For prescription refill requests, have your pharmacy contact our office.       Resources For Cancer Patients and their Caregivers ? American Cancer Society: Can assist with transportation, wigs, general needs, runs Look Good Feel Better.        919 719 9442 ? Cancer Care: Provides financial assistance, online  support groups, medication/co-pay assistance.  1-800-813-HOPE 580-771-0470) ? [facility] Assists [place] cancer patients and their families through emotional , educational and financial support.  989-411-6946 ? [place] DSS Where to apply for food stamps, Medicaid and utility assistance. 343-554-1035 ? [facility]: Transportation to medical appointments. 925-552-6528 ? Social Security Administration: May apply for disability if have a Stage IV cancer. 620-328-4428 703 355 9046 ? [employer], Disability and Transit Services: Assists with nutrition, care and transit needs. [place] Support Programs: '@10RELATIVEDAYS'$ @ > Cancer Support Group  2nd Tuesday of the month 1pm-2pm, Journey Room  > Creative Journey  3rd Tuesday of the month 1130am-1pm, Journey Room  > Look Good Feel Better  1st Wednesday of the month 10am-12 noon, Journey Room (Call [facility] to register [phone])

## 2016-02-19 NOTE — Assessment & Plan Note (Addendum)
Stage IV renal cell carcinoma, initially treated with Votrient. Progression of disease was noted in Jan 2016 at which time, treatment was changed to Nivolumab. Now with progression requiring a change in therapy by [facility] to cabozantinib, which is CURRENTLY on HOLD since 02/04/2016.  Oncology history is updated.  No role for oncology labs today.  I personally reviewed and went over radiographic studies with the patient.  The results are noted within this dictation.  MRI brain performed on 02/05/2016 is reviewed.  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  I discussed this read with the neuro-radiologist who confirms that this new finding would not affect the patient's ability to swallow.  I also reviewed the patient's swallow study and SLP recommendations.  She was referred to ENT on 02/07/2016, Dr. Benjamine Mola.  She reported to the ED on 02/11/2016 at oncology triage nurse request for nausea and vomiting.  She was given IVF in the ED and IV antiemetics.  She improved and went home.  There is documentation that she called the clinic on 02/14/2016.  Nursing could not understand her due to slurred speech and the patient was advised to report to the ED.  She did not follow these instructions.  She will continue to HOLD her Cabometyx.  She is failing to thrive.  Ongoing weight loss is noted.  She would benefit most from [facility].  I have recommended COMFORT CARE with HOSPICE.  We had a long discussion regarding hospice despite our ongoing goals-of-care discussions in the past.  "I never thought this day would come."  I have placed a call to [name], NP [facility]) to discuss the patient's case.  A VM was left for her with my pager #.  I wanted to update her prior to the patient's appointment tomorrow with her.  Due to her chronic pain, I have refilled her Oxycodone and I have started her on low-dose MS Contin 15  mg BID.  I would not hesitate to increase in 72 hours if not beneficial.  She is given bowel regimen.    I will tentatively have her return in 2 weeks.  If she pursues HOSPICE, then we will cancel this appointment.  More than 50% of the time spent with the patient was utilized for counseling and coordination of care.

## 2016-02-19 NOTE — Progress Notes (Signed)
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 03/21/2013 . Decreased range of motion of right shoulder 03/21/2013 . Muscle weakness (generalized) 03/21/2013 . Personal history of malignant neoplasm of kidney 06/30/2012 . Metastatic renal cell carcinoma ([place])  . Pain in thoracic spine 12/09/2011 . DDD (degenerative disc disease), lumbar 12/09/2011 . Right ankle pain 10/28/2011 . Difficulty in walking(719.7) 10/28/2011 . HEADACHE 04/13/2007 . ABNORMAL ELECTROCARDIOGRAM 04/13/2007 . Hyperlipemia 09/03/2006 . Depression 09/03/2006 . Essential hypertension 09/03/2006 . COPD 09/03/2006 . CONSTIPATION NOS 09/03/2006 . OSTEOARTHRITIS 09/03/2006 . LOW BACK PAIN, CHRONIC 09/03/2006 . BURSITIS, HIPS, BILATERAL 09/03/2006 . FIBROMYALGIA 09/03/2006 . FATIGUE 09/03/2006 . URINARY INCONTINENCE 09/03/2006 Thank you, Genene Churn, Burton Genene Churn February 09, 2016, 5:29 PM [place] [address] [place], [place], [address] Phone: [phone]   Fax:  [phone] Name: [name] [name] MRN:  [id number] Date of Birth: Apr 12, 1950 CLINICAL DATA:  Wet voice, oropharyngeal dysphagia EXAM: MODIFIED BARIUM SWALLOW TECHNIQUE: Different  consistencies of barium were administered orally to the patient by the Speech Pathologist. Imaging of the pharynx was performed in the lateral projection. FLUOROSCOPY TIME:  Fluoroscopy Time:  2 minutes 18 seconds Radiation Exposure Index (if provided by the fluoroscopic device): 15.6 mGy Number of Acquired Spot Images: Screen captures of fluoroscopic images COMPARISON:  None FINDINGS: Thin liquid- with thin liquid by cup, flash laryngeal penetration aspiration were identified. With chin tuck maneuver, no laryngeal penetration or aspiration was seen. Subsequent swallows of thin barium by cup demonstrated laryngeal penetration aspiration with minimal vallecular residual. With thin barium by straw presentation and chin-tuck, flash laryngeal penetration was identified without aspiration. No aspiration was seen with any of the chin tuck maneuvers. Nectar thick liquid- within normal limits Honey- not evaluated Pure- within normal limits Cracker-within normal limits Pure with cracker- not evaluated Barium tablet - pill passed from oral cavity to the stomach without delay. Laryngeal penetration and aspiration of the thin barium utilized to swallow the pill was noted. No spontaneous cough reflex identified. IMPRESSION: Swallowing dysfunction as above. Please refer to the Speech Pathologists report for complete details and recommendations. Electronically Signed   By: Lavonia Dana M.D.   On: 02/06/2016 13:52   Dg Abd Acute W/chest  Result Date: 02/11/2016 CLINICAL DATA:  Nausea for 3 days. EXAM: DG ABDOMEN ACUTE W/ 1V CHEST COMPARISON:  CT 11/27/2015 FINDINGS: The right-sided port appears satisfactorily positioned, extending to the cavoatrial junction. The abdominal gas pattern is negative for obstruction or perforation. There is stool and air throughout the colon. No biliary or urinary  calculi are evident. The upright view of the chest demonstrates only mild curvilinear scarring or atelectasis in the left base. No significant acute cardiopulmonary abnormalities. IMPRESSION: Negative abdominal radiographs.  No acute cardiopulmonary disease. Electronically Signed   By: Andreas Newport M.D.   On: 02/11/2016 17:57     PATHOLOGY:    ASSESSMENT AND PLAN:  Metastatic renal cell carcinoma (HCC) Stage IV renal cell carcinoma, initially treated with Votrient. Progression of disease was noted in Jan 2016 at which time, treatment was changed to Nivolumab. Now with progression requiring a change in therapy by [facility] to cabozantinib, which is CURRENTLY on HOLD since 02/04/2016.  Oncology history is updated.  No role for oncology labs today.  I personally reviewed and went over radiographic studies with the patient.  The results are noted within this dictation.  MRI brain performed on 02/05/2016 is reviewed.  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  I discussed this read with the neuro-radiologist who confirms that this new finding would not affect the patient's ability to swallow.  I also reviewed the patient's swallow study and SLP recommendations.  She was referred to ENT on 02/07/2016, Dr. Benjamine Mola.  She reported to the ED on 02/11/2016 at oncology triage nurse request for nausea and vomiting.  She was given IVF in the ED and IV antiemetics.  She improved and went home.  There is documentation that she called the clinic on 02/14/2016.  Nursing could not understand her due to slurred speech and the patient was advised to report to the ED.  She did not follow these instructions.  She will continue to HOLD her Cabometyx.  She is failing to thrive.  Ongoing weight loss is noted.  She would benefit most from [facility].  I have recommended COMFORT CARE with HOSPICE.  We had a long  discussion  regarding hospice despite our ongoing goals-of-care discussions in the past.  "I never thought this day would come."  I have placed a call to [name], NP [facility]) to discuss the patient's case.  A VM was left for her with my pager #.  I wanted to update her prior to the patient's appointment tomorrow with her.  Due to her chronic pain, I have refilled her Oxycodone and I have started her on low-dose MS Contin 15 mg BID.  I would not hesitate to increase in 72 hours if not beneficial.  She is given bowel regimen.    I will tentatively have her return in 2 weeks.  If she pursues HOSPICE, then we will cancel this appointment.  More than 50% of the time spent with the patient was utilized for counseling and coordination of care.   ORDERS PLACED FOR THIS ENCOUNTER: No orders of the defined types were placed in this encounter.   MEDICATIONS PRESCRIBED THIS ENCOUNTER: Meds ordered this encounter  Medications  . doxycycline (VIBRAMYCIN) 100 MG capsule  . oxyCODONE (OXY IR/ROXICODONE) 5 MG immediate release tablet    Sig: Take 1-2 tablets (5-10 mg total) by mouth every 6 (six) hours as needed for severe pain.    Dispense:  60 tablet    Refill:  0    Order Specific Question:   Supervising Provider    Answer:   Brunetta Genera [[id number]]  . morphine (MS CONTIN) 15 MG 12 hr tablet    Sig: Take 1 tablet (15 mg total) by mouth every 12 (twelve) hours.    Dispense:  60 tablet    Refill:  0    Order Specific Question:   Supervising Provider    Answer:   Brunetta Genera [[id number]]    THERAPY PLAN:  Recommend HOSPICE.  She will see [facility] tomorrow to learn their recommendations.  All questions were answered. The patient knows to call the clinic with any problems, questions or concerns. We can certainly see the patient much sooner if necessary.  Patient and plan discussed with Dr. Twana First and she is in agreement with the aforementioned.   This note is electronically signed by:  Doy Mince 02/19/2016 4:48 PM

## 2016-02-20 ENCOUNTER — Telehealth (HOSPITAL_COMMUNITY): Payer: Self-pay | Admitting: Oncology

## 2016-02-20 NOTE — Telephone Encounter (Signed)
I have discussed the patient's case with [name], NP at [facility].  She notes that the patient's husband called her clinic cancelling [name] [name]'s appointment today because she "could not make it."  Virgel Bouquet reports discussing her case with the patient's husband and essentially reports that Cabometyx was the patient's final treatment option and she does not recommend further therapy.  This confirms what I told the patient.  Virgel Bouquet reports that she will be calling the husband back to confirm that she and I have conversed and we agree with comfort measures only.  I have asked her to have the patient's husband call us when they are ready for Hospice intervention.  Robynn Pane, PA-C 02/20/2016 7:17 PM

## 2016-02-24 ENCOUNTER — Telehealth (HOSPITAL_COMMUNITY): Payer: Self-pay | Admitting: Emergency Medicine

## 2016-02-24 NOTE — Telephone Encounter (Signed)
Pts husband called and wanted to know if Gershon Mussel has spoke with [facility].  I explained that [name] NP states that carbometyx is the last option and that she doesn't recommend any other options but comfort care.  Both her and Gershon Mussel are in agreement to this.  He wasn't ready for hospice yet.  I gave him my number and told him to call me when he was ready.  He said hospice was such a dirty word.  I told him that it would give them so much help they would both need and support.  I told him I would call to check on them tomorrow.

## 2016-02-26 ENCOUNTER — Telehealth (HOSPITAL_COMMUNITY): Payer: Self-pay | Admitting: Emergency Medicine

## 2016-02-26 ENCOUNTER — Telehealth: Payer: Self-pay | Admitting: Family Medicine

## 2016-02-26 MED ORDER — MORPHINE SULFATE 20 MG/5ML PO SOLN: ORAL | 0 refills | Status: AC

## 2016-02-26 NOTE — Telephone Encounter (Signed)
Pt was admitted to hospice this AM and they were needing a MD to take over her care. She is a patient on [employer] PA here but they they needed and MD for orders. OK'd for Dr. Dennard Schaumann to be attending for Hospice and he will consult with MBD. Hospice now calling stating that pt is not doing well at all and would like a RX for Morphine faxed to [facility]. Per MBD ok to do and RX printed signed and faxed to [facility]. Morphine 20 mg/ml .25-1 ml q 2 hours prn pain. #31m.

## 2016-02-26 NOTE — Telephone Encounter (Signed)
Hospice referral sent.

## 2016-02-27 ENCOUNTER — Telehealth (HOSPITAL_COMMUNITY): Payer: Self-pay | Admitting: *Deleted

## 2016-02-27 NOTE — Telephone Encounter (Signed)
left voice message, provider out of office 03/03/16.

## 2016-03-03 ENCOUNTER — Ambulatory Visit (HOSPITAL_COMMUNITY): Payer: Self-pay | Admitting: Psychiatry

## 2016-03-10 ENCOUNTER — Ambulatory Visit (HOSPITAL_COMMUNITY): Payer: Self-pay | Admitting: Oncology

## 2016-03-16 ENCOUNTER — Ambulatory Visit (HOSPITAL_COMMUNITY): Payer: Self-pay | Admitting: Psychiatry

## 2016-03-18 ENCOUNTER — Telehealth (HOSPITAL_COMMUNITY): Payer: Self-pay | Admitting: *Deleted

## 2016-03-18 NOTE — Telephone Encounter (Signed)
Pt husband called stating that pt is in the last stage of her cancer and she is in hospice. Per pt husband he would like for Dr. Harrington Challenger and Maurice Small to know.

## 2016-03-18 NOTE — Telephone Encounter (Signed)
noted 

## 2016-03-24 ENCOUNTER — Telehealth (HOSPITAL_COMMUNITY): Payer: Self-pay | Admitting: Psychiatry

## 2016-03-24 NOTE — Telephone Encounter (Signed)
Therapist contacted patient's spouse in reference to his call regarding patient being in [place]. He reported patient died on 04-03-2016. Therapist expressed condolences.

## 2016-04-12 DEATH — deceased
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
